# Patient Record
Sex: Male | Born: 1950 | Race: White | Hispanic: No | Marital: Married | Smoking: Former smoker
Health system: Southern US, Community
[De-identification: ages and names within clinical notes are randomized; demographics above are authoritative.]

## PROBLEM LIST (undated history)

## (undated) DIAGNOSIS — L409 Psoriasis, unspecified: Secondary | ICD-10-CM

## (undated) DIAGNOSIS — I509 Heart failure, unspecified: Secondary | ICD-10-CM

## (undated) DIAGNOSIS — K219 Gastro-esophageal reflux disease without esophagitis: Secondary | ICD-10-CM

## (undated) DIAGNOSIS — G4730 Sleep apnea, unspecified: Secondary | ICD-10-CM

## (undated) DIAGNOSIS — J449 Chronic obstructive pulmonary disease, unspecified: Secondary | ICD-10-CM

## (undated) DIAGNOSIS — G629 Polyneuropathy, unspecified: Secondary | ICD-10-CM

## (undated) DIAGNOSIS — I82409 Acute embolism and thrombosis of unspecified deep veins of unspecified lower extremity: Secondary | ICD-10-CM

## (undated) DIAGNOSIS — R51 Headache: Secondary | ICD-10-CM

## (undated) DIAGNOSIS — I739 Peripheral vascular disease, unspecified: Secondary | ICD-10-CM

## (undated) DIAGNOSIS — C801 Malignant (primary) neoplasm, unspecified: Secondary | ICD-10-CM

## (undated) DIAGNOSIS — D649 Anemia, unspecified: Secondary | ICD-10-CM

## (undated) DIAGNOSIS — M79606 Pain in leg, unspecified: Secondary | ICD-10-CM

## (undated) DIAGNOSIS — R0602 Shortness of breath: Secondary | ICD-10-CM

## (undated) HISTORY — PX: VEIN LIGATION AND STRIPPING: SHX2653

## (undated) HISTORY — DX: Heart failure, unspecified: I50.9

## (undated) HISTORY — DX: Chronic obstructive pulmonary disease, unspecified: J44.9

## (undated) HISTORY — PX: OTHER SURGICAL HISTORY: SHX169

## (undated) HISTORY — DX: Pain in leg, unspecified: M79.606

## (undated) HISTORY — DX: Acute embolism and thrombosis of unspecified deep veins of unspecified lower extremity: I82.409

## (undated) HISTORY — PX: CYST EXCISION: SHX5701

## (undated) HISTORY — DX: Shortness of breath: R06.02

## (undated) HISTORY — DX: Gastro-esophageal reflux disease without esophagitis: K21.9

## (undated) HISTORY — PX: CARDIAC CATHETERIZATION: SHX172

---

## 2010-12-03 ENCOUNTER — Encounter (INDEPENDENT_AMBULATORY_CARE_PROVIDER_SITE_OTHER): Payer: Medicare Other

## 2010-12-03 ENCOUNTER — Ambulatory Visit (INDEPENDENT_AMBULATORY_CARE_PROVIDER_SITE_OTHER): Payer: Medicare Other | Admitting: Vascular Surgery

## 2010-12-03 ENCOUNTER — Encounter: Payer: Self-pay | Admitting: Vascular Surgery

## 2010-12-03 VITALS — BP 110/62 | HR 74 | Resp 20 | Ht 71.0 in | Wt 340.0 lb

## 2010-12-03 DIAGNOSIS — I83229 Varicose veins of left lower extremity with both ulcer of unspecified site and inflammation: Secondary | ICD-10-CM

## 2010-12-03 DIAGNOSIS — I83219 Varicose veins of right lower extremity with both ulcer of unspecified site and inflammation: Secondary | ICD-10-CM

## 2010-12-03 DIAGNOSIS — I739 Peripheral vascular disease, unspecified: Secondary | ICD-10-CM

## 2010-12-03 DIAGNOSIS — L98499 Non-pressure chronic ulcer of skin of other sites with unspecified severity: Secondary | ICD-10-CM

## 2010-12-03 NOTE — Progress Notes (Signed)
Subjective:     Patient ID: Jeremy [name], male   DOB: 1950-06-26, 60 y.o.   MRN: [id number]  HPI this 60 year old obese male patient was referred from the pain Center for evaluation of an extensive ulcer in the left lateral ankle area. The patient states that he has had ulcers in his left leg for the past thirty-year's or so. He has had bilateral vein strippings in the 1970s. He thinks he has a history of deep vein thrombosis. He has had skin grafts in the left leg ulcer on at least 2 occasions. He currently has a topical skin product-applegraft in place. He was referred for evaluation for arterial insufficiency. He ambulates very short distances. He has morbid obesity and has had 2 skin stapling procedures in the 1980s. He is on home oxygen.   Today I ordered a lower arterial Dopplers which were normal with good wave forms and excellent ABIs bilaterally. I also ordered a venous duplex exam of the left leg which are reviewed and interpreted and performed a bedside sono site venous exam independently. He has no evidence of residual great saphenous vein in the groin or thigh area. His deep venous system has evidence of previous DVT and has severe reflux.   Review of Systems  Constitutional: Positive for unexpected weight change. Negative for fever, chills and appetite change.  HENT: Negative for hearing loss, nosebleeds, congestion, sore throat, trouble swallowing and voice change.   Eyes: Negative for photophobia and visual disturbance.  Respiratory: Negative for cough, chest tightness, shortness of breath and wheezing.   Cardiovascular: Positive for leg swelling. Negative for chest pain and palpitations.  Gastrointestinal: Positive for abdominal distention. Negative for nausea, vomiting, abdominal pain, diarrhea, constipation and blood in stool.       He has a history of morbid obesity with 2 previous gastric stapling procedures performed in the 1980s. He gained weight after that period of time and  this continued to be morbidly obese.  Genitourinary: Negative for urgency, frequency, hematuria and difficulty urinating.  Musculoskeletal: Negative for back pain, joint swelling, arthralgias and gait problem.  Skin: Positive for wound. Negative for pallor and rash.       There is an extensive venous stasis type ulcer almost circumferential in nature. It measures approximately 12 x 14 cm and involves the lateral aspect of the lower leg on the left. The skin surrounding this has hyperpigmentation and is scaly consistent with chronic venous insufficiency. He has similar skin changes on the right which are not as severe with no active ulcer. His toes are pink and well perfused.  Neurological: Negative for dizziness, syncope, facial asymmetry, speech difficulty, weakness, light-headedness, numbness and headaches.  Hematological: Negative for adenopathy.  Psychiatric/Behavioral: Negative for confusion.       Objective:   Physical Exam  Constitutional: He is oriented to person, place, and time. He appears well-developed and well-nourished. No distress.  HENT:  Head: Normocephalic.  Mouth/Throat: Oropharynx is clear and moist.  Eyes: EOM are normal.  Neck: Normal range of motion. Neck supple.  Cardiovascular: Normal rate and regular rhythm.   No murmur heard.      Pulses are not palpable but his toes are pink and warm and well perfused.  Pulmonary/Chest: Effort normal and breath sounds normal. No respiratory distress. He has no wheezes. He has no rales.  Abdominal: Soft. He exhibits no distension and no mass. There is no tenderness. There is no rebound and no guarding.  Musculoskeletal: Normal range of motion. He  exhibits no edema.  Lymphadenopathy:    He has no cervical adenopathy.  Neurological: He is alert and oriented to person, place, and time. Coordination normal.  Skin: Skin is warm and dry. No rash noted.       He has an extensive stasis ulcer on the lateral aspect of the left lower leg  measuring 12-14 cm in diameter. The skin surrounding this has hyperpigmentation and is very crusty and scaly in appearance. Right leg has similar hyperpigmentation but no active ulcers. There is chronic edema in both lower extremities at 1-2+.  Psychiatric: His behavior is normal.   Blood pressure 110/62, pulse 74, resp. rate 20, height 5\' 11"  (1.803 m), weight 340 lb (154.223 kg).   Assessment:    no evidence of arterial insufficiency by arterial Doppler exam. Waveforms are normal and ABIs are normal. Chronic venous insufficiency due to deep venous reflux with no evidence of superficial venous reflux. Previous vein stripping left greater saphenous vein. 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    Plan:     Nothing to offer from a standpoint of treatment for severe venous insufficiency. Elevation, elastic compression, local wound care, possible skin grafting. There is no need for further arterial evaluation. I have no further recommendations signed

## 2010-12-21 ENCOUNTER — Encounter: Payer: Self-pay | Admitting: Vascular Surgery

## 2011-03-12 ENCOUNTER — Encounter (HOSPITAL_COMMUNITY): Payer: Self-pay | Admitting: *Deleted

## 2011-03-12 ENCOUNTER — Encounter (HOSPITAL_COMMUNITY): Payer: Self-pay | Admitting: Pharmacy Technician

## 2011-03-12 ENCOUNTER — Inpatient Hospital Stay (HOSPITAL_COMMUNITY)
Admission: RE | Admit: 2011-03-12 | Discharge: 2011-03-26 | DRG: 264 | Disposition: A | Discharge status: Home / Self Care | Payer: Medicare Other | Source: Ambulatory Visit | Attending: Orthopedic Surgery | Admitting: Orthopedic Surgery

## 2011-03-12 ENCOUNTER — Ambulatory Visit (HOSPITAL_COMMUNITY): Payer: Medicare Other

## 2011-03-12 ENCOUNTER — Other Ambulatory Visit: Payer: Self-pay

## 2011-03-12 ENCOUNTER — Other Ambulatory Visit (HOSPITAL_COMMUNITY): Payer: Self-pay | Admitting: Orthopedic Surgery

## 2011-03-12 DIAGNOSIS — I83219 Varicose veins of right lower extremity with both ulcer of unspecified site and inflammation: Secondary | ICD-10-CM

## 2011-03-12 DIAGNOSIS — Z86718 Personal history of other venous thrombosis and embolism: Secondary | ICD-10-CM

## 2011-03-12 DIAGNOSIS — I83229 Varicose veins of left lower extremity with both ulcer of unspecified site and inflammation: Secondary | ICD-10-CM

## 2011-03-12 DIAGNOSIS — B965 Pseudomonas (aeruginosa) (mallei) (pseudomallei) as the cause of diseases classified elsewhere: Secondary | ICD-10-CM | POA: Diagnosis present

## 2011-03-12 DIAGNOSIS — E669 Obesity, unspecified: Secondary | ICD-10-CM | POA: Diagnosis present

## 2011-03-12 DIAGNOSIS — Z7901 Long term (current) use of anticoagulants: Secondary | ICD-10-CM

## 2011-03-12 DIAGNOSIS — E119 Type 2 diabetes mellitus without complications: Secondary | ICD-10-CM | POA: Diagnosis present

## 2011-03-12 DIAGNOSIS — E875 Hyperkalemia: Secondary | ICD-10-CM | POA: Diagnosis not present

## 2011-03-12 DIAGNOSIS — A4902 Methicillin resistant Staphylococcus aureus infection, unspecified site: Secondary | ICD-10-CM | POA: Diagnosis present

## 2011-03-12 DIAGNOSIS — N179 Acute kidney failure, unspecified: Secondary | ICD-10-CM | POA: Diagnosis not present

## 2011-03-12 DIAGNOSIS — I83009 Varicose veins of unspecified lower extremity with ulcer of unspecified site: Principal | ICD-10-CM | POA: Diagnosis present

## 2011-03-12 DIAGNOSIS — D649 Anemia, unspecified: Secondary | ICD-10-CM | POA: Diagnosis present

## 2011-03-12 HISTORY — DX: Malignant (primary) neoplasm, unspecified: C80.1

## 2011-03-12 HISTORY — DX: Anemia, unspecified: D64.9

## 2011-03-12 HISTORY — DX: Headache: R51

## 2011-03-12 HISTORY — DX: Sleep apnea, unspecified: G47.30

## 2011-03-12 HISTORY — DX: Peripheral vascular disease, unspecified: I73.9

## 2011-03-12 LAB — SURGICAL PCR SCREEN: MRSA, PCR: POSITIVE — AB

## 2011-03-12 LAB — GLUCOSE, CAPILLARY: Glucose-Capillary: 104 mg/dL — ABNORMAL HIGH (ref 70–99)

## 2011-03-12 LAB — COMPREHENSIVE METABOLIC PANEL
ALT: 10 U/L (ref 0–53)
CO2: 27 mEq/L (ref 19–32)
Calcium: 8.6 mg/dL (ref 8.4–10.5)
Chloride: 99 mEq/L (ref 96–112)
Creatinine, Ser: 0.88 mg/dL (ref 0.50–1.35)
GFR calc Af Amer: >90 mL/min (ref >90)
GFR calc non Af Amer: >90 mL/min (ref >90)
Glucose, Bld: 108 mg/dL — ABNORMAL HIGH (ref 70–99)
Total Bilirubin: 0.3 mg/dL (ref 0.3–1.2)

## 2011-03-12 LAB — PROTIME-INR: INR: 3.64 — ABNORMAL HIGH (ref 0.00–1.49)

## 2011-03-12 LAB — CBC
Hemoglobin: 10.1 g/dL — ABNORMAL LOW (ref 13.0–17.0)
MCH: 29.3 pg (ref 26.0–34.0)
MCV: 91.6 fL (ref 78.0–100.0)
RBC: 3.45 MIL/uL — ABNORMAL LOW (ref 4.22–5.81)

## 2011-03-12 MED ORDER — SIMVASTATIN 5 MG PO TABS
5.0000 mg | ORAL_TABLET | Freq: Every day | ORAL | Status: DC
  Administered 2011-03-13 – 2011-03-25 (×13): 5 mg via ORAL
  Filled 2011-03-12 (×14): qty 1

## 2011-03-12 MED ORDER — INSULIN ASPART 100 UNIT/ML ~~LOC~~ SOLN
0.0000 [IU] | Freq: Three times a day (TID) | SUBCUTANEOUS | Status: DC
  Administered 2011-03-13 – 2011-03-25 (×4): 3 [IU] via SUBCUTANEOUS
  Filled 2011-03-12: qty 3

## 2011-03-12 MED ORDER — METFORMIN HCL 500 MG PO TABS
500.0000 mg | ORAL_TABLET | Freq: Every day | ORAL | Status: DC
  Administered 2011-03-13 – 2011-03-17 (×5): 500 mg via ORAL
  Filled 2011-03-12 (×8): qty 1

## 2011-03-12 MED ORDER — HYDROCODONE-ACETAMINOPHEN 5-325 MG PO TABS
1.0000 | ORAL_TABLET | Freq: Once | ORAL | Status: AC
  Administered 2011-03-12: 2 via ORAL
  Filled 2011-03-12: qty 2

## 2011-03-12 MED ORDER — WARFARIN SODIUM 5 MG PO TABS: 5.0000 mg | ORAL_TABLET | Freq: Every day | ORAL | Status: DC

## 2011-03-12 MED ORDER — REPAGLINIDE 1 MG PO TABS
1.0000 mg | ORAL_TABLET | Freq: Every day | ORAL | Status: DC
  Administered 2011-03-13 – 2011-03-25 (×11): 1 mg via ORAL
  Filled 2011-03-12 (×14): qty 1

## 2011-03-12 MED ORDER — LEVALBUTEROL TARTRATE 45 MCG/ACT IN AERO
1.0000 | INHALATION_SPRAY | RESPIRATORY_TRACT | Status: DC | PRN
  Filled 2011-03-12: qty 15

## 2011-03-12 MED ORDER — CEFAZOLIN SODIUM-DEXTROSE 2-3 GM-% IV SOLR
2.0000 g | INTRAVENOUS | Status: DC
  Filled 2011-03-12: qty 50

## 2011-03-12 MED ORDER — MONTELUKAST SODIUM 10 MG PO TABS
10.0000 mg | ORAL_TABLET | Freq: Every day | ORAL | Status: DC
  Administered 2011-03-12 – 2011-03-25 (×15): 10 mg via ORAL
  Filled 2011-03-12 (×16): qty 1

## 2011-03-12 MED ORDER — WARFARIN SODIUM 7.5 MG PO TABS: 7.5000 mg | ORAL_TABLET | Freq: Every day | ORAL | Status: DC

## 2011-03-12 MED ORDER — CLONAZEPAM 0.5 MG PO TABS
0.5000 mg | ORAL_TABLET | Freq: Two times a day (BID) | ORAL | Status: DC | PRN
  Administered 2011-03-12 – 2011-03-20 (×3): 0.5 mg via ORAL
  Filled 2011-03-12 (×3): qty 1

## 2011-03-12 MED ORDER — SODIUM CHLORIDE 0.9 % IV SOLN
INTRAVENOUS | Status: DC
  Administered 2011-03-12: 20 mL via INTRAVENOUS

## 2011-03-12 MED ORDER — TRAMADOL HCL 50 MG PO TABS
50.0000 mg | ORAL_TABLET | Freq: Four times a day (QID) | ORAL | Status: DC | PRN
  Administered 2011-03-12 – 2011-03-19 (×4): 50 mg via ORAL
  Filled 2011-03-12 (×4): qty 1

## 2011-03-12 MED ORDER — ALBUTEROL SULFATE (5 MG/ML) 0.5% IN NEBU
2.5000 mg | INHALATION_SOLUTION | Freq: Four times a day (QID) | RESPIRATORY_TRACT | Status: DC
  Administered 2011-03-13: 2.5 mg via RESPIRATORY_TRACT
  Filled 2011-03-12: qty 0.5

## 2011-03-12 MED ORDER — CLINDAMYCIN PHOSPHATE 600 MG/50ML IV SOLN: 600.0000 mg | Freq: Three times a day (TID) | INTRAVENOUS | Status: DC

## 2011-03-12 MED ORDER — CHLORHEXIDINE GLUCONATE 4 % EX LIQD
60.0000 mL | Freq: Once | CUTANEOUS | Status: AC
  Administered 2011-03-12: 4 via TOPICAL

## 2011-03-12 MED ORDER — LISINOPRIL 10 MG PO TABS
10.0000 mg | ORAL_TABLET | Freq: Every day | ORAL | Status: DC
  Administered 2011-03-13 – 2011-03-20 (×6): 10 mg via ORAL
  Filled 2011-03-12 (×9): qty 1

## 2011-03-12 MED ORDER — FERROUS SULFATE 325 (65 FE) MG PO TABS
325.0000 mg | ORAL_TABLET | Freq: Three times a day (TID) | ORAL | Status: DC
  Administered 2011-03-12 – 2011-03-26 (×41): 325 mg via ORAL
  Filled 2011-03-12 (×43): qty 1

## 2011-03-12 MED ORDER — OXYCODONE HCL 15 MG PO TB12: 7.5000 mg | ORAL_TABLET | Freq: Four times a day (QID) | ORAL | Status: DC | PRN

## 2011-03-12 MED ORDER — WARFARIN SODIUM 7.5 MG PO TABS
7.5000 mg | ORAL_TABLET | ORAL | Status: DC
  Filled 2011-03-12: qty 1

## 2011-03-12 MED ORDER — POTASSIUM CHLORIDE CRYS ER 10 MEQ PO TBCR
10.0000 meq | EXTENDED_RELEASE_TABLET | Freq: Every day | ORAL | Status: DC
  Administered 2011-03-13 – 2011-03-21 (×9): 10 meq via ORAL
  Filled 2011-03-12 (×9): qty 1

## 2011-03-12 MED ORDER — CIPROFLOXACIN HCL 500 MG PO TABS
500.0000 mg | ORAL_TABLET | Freq: Two times a day (BID) | ORAL | Status: DC
  Administered 2011-03-12 – 2011-03-18 (×12): 500 mg via ORAL
  Filled 2011-03-12 (×14): qty 1

## 2011-03-12 MED ORDER — REPAGLINIDE-METFORMIN HCL 1-500 MG PO TABS
1.0000 | ORAL_TABLET | Freq: Every day | ORAL | Status: DC
Start: 2011-03-12 — End: 2011-03-12

## 2011-03-12 MED ORDER — FUROSEMIDE 40 MG PO TABS
40.0000 mg | ORAL_TABLET | Freq: Two times a day (BID) | ORAL | Status: DC
  Administered 2011-03-13 – 2011-03-20 (×16): 40 mg via ORAL
  Filled 2011-03-12 (×20): qty 1

## 2011-03-12 MED ORDER — TIOTROPIUM BROMIDE MONOHYDRATE 18 MCG IN CAPS
18.0000 ug | ORAL_CAPSULE | Freq: Every day | RESPIRATORY_TRACT | Status: DC
  Administered 2011-03-13 – 2011-03-26 (×14): 18 ug via RESPIRATORY_TRACT
  Filled 2011-03-12 (×3): qty 5

## 2011-03-12 MED ORDER — WARFARIN SODIUM 5 MG IV SOLR: 5.0000 mg | INTRAVENOUS | Status: DC

## 2011-03-12 MED ORDER — VANCOMYCIN HCL 1000 MG IV SOLR
1500.0000 mg | Freq: Two times a day (BID) | INTRAVENOUS | Status: DC
  Administered 2011-03-13 – 2011-03-19 (×12): 1500 mg via INTRAVENOUS
  Filled 2011-03-12 (×18): qty 1500

## 2011-03-12 MED ORDER — FENTANYL 75 MCG/HR TD PT72
75.0000 ug | MEDICATED_PATCH | TRANSDERMAL | Status: DC
  Administered 2011-03-12 – 2011-03-18 (×3): 75 ug via TRANSDERMAL
  Filled 2011-03-12 (×3): qty 1

## 2011-03-12 MED ORDER — SULFAMETHOXAZOLE-TMP DS 800-160 MG PO TABS
1.0000 | ORAL_TABLET | Freq: Two times a day (BID) | ORAL | Status: DC
  Administered 2011-03-12: 1 via ORAL

## 2011-03-12 MED ORDER — VANCOMYCIN HCL 1000 MG IV SOLR
2500.0000 mg | Freq: Once | INTRAVENOUS | Status: AC
  Administered 2011-03-12: 2500 mg via INTRAVENOUS
  Filled 2011-03-12: qty 2500

## 2011-03-12 MED ORDER — FLUTICASONE-SALMETEROL 500-50 MCG/DOSE IN AEPB
1.0000 | INHALATION_SPRAY | Freq: Two times a day (BID) | RESPIRATORY_TRACT | Status: DC
  Administered 2011-03-13 – 2011-03-26 (×27): 1 via RESPIRATORY_TRACT
  Filled 2011-03-12 (×2): qty 14

## 2011-03-12 MED ORDER — INSULIN ASPART 100 UNIT/ML ~~LOC~~ SOLN
6.0000 [IU] | Freq: Three times a day (TID) | SUBCUTANEOUS | Status: DC
  Administered 2011-03-13 – 2011-03-15 (×3): 6 [IU] via SUBCUTANEOUS
  Administered 2011-03-18: 3 [IU] via SUBCUTANEOUS

## 2011-03-12 MED ORDER — WARFARIN SODIUM 5 MG PO TABS: 5.0000 mg | ORAL_TABLET | ORAL | Status: DC

## 2011-03-12 MED ORDER — MUPIROCIN 2 % EX OINT
TOPICAL_OINTMENT | CUTANEOUS | Status: AC
  Administered 2011-03-12: 13:00:00
  Filled 2011-03-12: qty 22

## 2011-03-12 NOTE — Progress Notes (Signed)
Sleep study requested from Dr Delia Heady in [place], [place]

## 2011-03-12 NOTE — Progress Notes (Signed)
ANTIBIOTIC CONSULT NOTE - INITIAL  Pharmacy Consult for Vancomycin Indication: Left leg ulcer requiring debridement  No Known Allergies  Patient Measurements: Height: 5\' 11"  (180.3 cm) Weight: 345 lb (156.491 kg) IBW/kg (Calculated) : 75.3    Vital Signs: Temp: 98 F (36.7 C) (11/20 1245) Temp src: Oral (11/20 1245) BP: 82/45 mmHg (11/20 1245) Pulse Rate: 63  (11/20 1245)  Labs:  Basename 03/12/11 1242  WBC 7.2  HGB 10.1*  PLT 246  LABCREA --  CREATININE 0.88   Estimated Creatinine Clearance: 137.8 ml/min (by C-G formula based on Cr of 0.88).   Microbiology: Recent Results (from the past 720 hour(s))  SURGICAL PCR SCREEN     Status: Abnormal   Collection Time   03/12/11 12:33 PM      Component Value Range Status Comment   MRSA, PCR POSITIVE (*) NEGATIVE  Final    Staphylococcus aureus POSITIVE (*) NEGATIVE  Final     Medical History: Past Medical History  Diagnosis Date  . Diabetes mellitus   . COPD (chronic obstructive pulmonary disease)   . GERD (gastroesophageal reflux disease)   . Asthma   . Shortness of breath   . Leg pain   . CHF (congestive heart failure)   . Peripheral vascular disease   . Sleep apnea     uses bipap, sleep study done in [place] 2 years ago, dr Cala Bradford byrd  . Anemia   . Headache     sinus headaches  . Cancer     benign skin cancer    Medications:  Prescriptions prior to admission  Medication Sig Dispense Refill  . albuterol (PROVENTIL) (2.5 MG/3ML) 0.083% nebulizer solution Take 2.5 mg by nebulization every 6 (six) hours as needed. For shortness of breath      . ciprofloxacin (CIPRO) 500 MG tablet Take 500 mg by mouth 2 (two) times daily.        . clonazePAM (KLONOPIN) 0.5 MG tablet Take 0.5 mg by mouth 2 (two) times daily as needed. For anxiety      . fentaNYL (DURAGESIC - DOSED MCG/HR) 75 MCG/HR Place 1 patch onto the skin every 3 (three) days.        . ferrous sulfate 325 (65 FE) MG tablet Take 325 mg by mouth 3  (three) times daily.        . Fluticasone-Salmeterol (ADVAIR) 500-50 MCG/DOSE AEPB Inhale 1 puff into the lungs every 12 (twelve) hours.        . furosemide (LASIX) 40 MG tablet Take 40 mg by mouth 2 (two) times daily.        [name] levalbuterol (XOPENEX HFA) 45 MCG/ACT inhaler Inhale 1-2 puffs into the lungs every 4 (four) hours as needed. For shortness of breath      . lisinopril (PRINIVIL,ZESTRIL) 10 MG tablet Take 10 mg by mouth daily.        . montelukast (SINGULAIR) 10 MG tablet Take 10 mg by mouth at bedtime.        [name] oxyCODONE (OXYCONTIN) 15 MG TB12 Take 7.5 mg by mouth every 6 (six) hours as needed. For pain      . potassium chloride (KLOR-CON) 10 MEQ CR tablet Take 10 mEq by mouth daily.        . pravastatin (PRAVACHOL) 10 MG tablet Take 10 mg by mouth daily.        . repaglinide-metformin (PRANDIMET) 1-500 MG tablet Take 1 tablet by mouth daily before supper.        [name]  tiotropium (SPIRIVA) 18 MCG inhalation capsule Place 18 mcg into inhaler and inhale daily.        [name] warfarin (COUMADIN) 5 MG tablet Take 5-7.5 mg by mouth daily. Sat and Sun only pt takes 1 tab (5mg ); All other days pt takes 1.5 tabs (7.5mg )       . DISCONTD: sulfamethoxazole-trimethoprim (BACTRIM DS) 800-160 MG per tablet Take 1 tablet by mouth 2 (two) times daily.        [name] DISCONTD: traMADol (ULTRAM) 50 MG tablet Take 50 mg by mouth every 6 (six) hours as needed.       [name] DISCONTD: warfarin (COUMADIN) 7.5 MG tablet Take 7.5 mg by mouth daily. Pt takes 7.5 Mon-Fri and Sat & Sunday        Assessment: 60 year old man to start in IV Vancomycin for left leg ulcers.    Goal of Therapy:  Vancomycin trough level 10-15 mcg/ml  Plan:  Measure antibiotic drug levels at steady state.  Follow renal function with MD.  Vancomycin dosing will be 2.5g IV x 1 dose, then 1500 mg IV q12.  Mickeal Skinner 03/12/2011,9:04 PM

## 2011-03-12 NOTE — H&P (Signed)
Jeremy [name] is an 60 y.o. male.   Chief Complaint: Left leg ulcer  HPI: Patient he reports a chronic 40 year history of ulceration to the left leg. Patient states he is undergone prolonged conservative treatment including an Unna compressive wraps Dynaflex compressive wraps and Profore compressive wraps. Patient has undergone split thickness skin graft as well as 3 applications of Apligraf. Patient has undergone silver alginate treatment serial debridements all without relief.  Past Medical History  Diagnosis Date  . Diabetes mellitus   . COPD (chronic obstructive pulmonary disease)   . GERD (gastroesophageal reflux disease)   . Asthma   . Shortness of breath   . Leg pain   . CHF (congestive heart failure)   . Peripheral vascular disease   . Sleep apnea     uses bipap, sleep study done in [place] 2 years ago, dr Cala Bradford byrd  . Anemia   . Headache     sinus headaches  . Cancer     benign skin cancer    Past Surgical History  Procedure Date  . Arm surgery   . Vein ligation and stripping   . Tummy tuck   . Stomach stapling   . Skin grafts     Family History  Problem Relation Age of Onset  . Heart disease Brother    Social History:  reports that he quit smoking about 20 years ago. His smoking use included Cigarettes. He has a 3 pack-year smoking history. He does not have any smokeless tobacco history on file. He reports that he drinks alcohol. He reports that he does not use illicit drugs.  Allergies: No Known Allergies  Medications Prior to Admission  Medication Dose Route Frequency Provider Last Rate Last Dose  . ceFAZolin (ANCEF) IVPB 2 g/50 mL premix  2 g Intravenous 60 min Pre-Op Nadara Mustard, MD      . HYDROcodone-acetaminophen [facility]) 5-325 MG per tablet 1-2 tablet  1-2 tablet Oral Once Aubery Lapping, MD   2 tablet at 03/12/11 1713  . mupirocin ointment (BACTROBAN) 2 %            Medications Prior to Admission  Medication Sig Dispense Refill  . albuterol  (PROVENTIL) (2.5 MG/3ML) 0.083% nebulizer solution Take 2.5 mg by nebulization every 6 (six) hours as needed. For shortness of breath      . clonazePAM (KLONOPIN) 0.5 MG tablet Take 0.5 mg by mouth 2 (two) times daily as needed. For anxiety      . ferrous sulfate 325 (65 FE) MG tablet Take 325 mg by mouth 3 (three) times daily.        . Fluticasone-Salmeterol (ADVAIR) 500-50 MCG/DOSE AEPB Inhale 1 puff into the lungs every 12 (twelve) hours.        . furosemide (LASIX) 40 MG tablet Take 40 mg by mouth 2 (two) times daily.        [name] levalbuterol (XOPENEX HFA) 45 MCG/ACT inhaler Inhale 1-2 puffs into the lungs every 4 (four) hours as needed. For shortness of breath      . lisinopril (PRINIVIL,ZESTRIL) 10 MG tablet Take 10 mg by mouth daily.        . montelukast (SINGULAIR) 10 MG tablet Take 10 mg by mouth at bedtime.        [name] oxyCODONE (OXYCONTIN) 15 MG TB12 Take 7.5 mg by mouth every 6 (six) hours as needed. For pain      . potassium chloride (KLOR-CON) 10 MEQ CR tablet Take 10  mEq by mouth daily.        . pravastatin (PRAVACHOL) 10 MG tablet Take 10 mg by mouth daily.        . repaglinide-metformin (PRANDIMET) 1-500 MG tablet Take 1 tablet by mouth daily before supper.        . tiotropium (SPIRIVA) 18 MCG inhalation capsule Place 18 mcg into inhaler and inhale daily.          Results for orders placed during the hospital encounter of 03/12/11 (from the past 48 hour(s))  SURGICAL PCR SCREEN     Status: Abnormal   Collection Time   03/12/11 12:33 PM      Component Value Range Comment   MRSA, PCR POSITIVE (*) NEGATIVE     Staphylococcus aureus POSITIVE (*) NEGATIVE    APTT     Status: Abnormal   Collection Time   03/12/11 12:42 PM      Component Value Range Comment   aPTT 81 (*) 24 - 37 (seconds)   CBC     Status: Abnormal   Collection Time   03/12/11 12:42 PM      Component Value Range Comment   WBC 7.2  4.0 - 10.5 (K/uL)    RBC 3.45 (*) 4.22 - 5.81 (MIL/uL)    Hemoglobin 10.1 (*) 13.0  - 17.0 (g/dL)    HCT 65.7 (*) 84.6 - 52.0 (%)    MCV 91.6  78.0 - 100.0 (fL)    MCH 29.3  26.0 - 34.0 (pg)    MCHC 32.0  30.0 - 36.0 (g/dL)    RDW 96.2  95.2 - 84.1 (%)    Platelets 246  150 - 400 (K/uL)   COMPREHENSIVE METABOLIC PANEL     Status: Abnormal   Collection Time   03/12/11 12:42 PM      Component Value Range Comment   Sodium 138  135 - 145 (mEq/L)    Potassium 5.0  3.5 - 5.1 (mEq/L)    Chloride 99  96 - 112 (mEq/L)    CO2 27  19 - 32 (mEq/L)    Glucose, Bld 108 (*) 70 - 99 (mg/dL)    BUN 22  6 - 23 (mg/dL)    Creatinine, Ser 3.24  0.50 - 1.35 (mg/dL)    Calcium 8.6  8.4 - 10.5 (mg/dL)    Total Protein 6.4  6.0 - 8.3 (g/dL)    Albumin 3.0 (*) 3.5 - 5.2 (g/dL)    AST 16  0 - 37 (U/L) HEMOLYSIS AT THIS LEVEL MAY AFFECT RESULT   ALT 10  0 - 53 (U/L)    Alkaline Phosphatase 85  39 - 117 (U/L)    Total Bilirubin 0.3  0.3 - 1.2 (mg/dL)    GFR calc non Af Amer >90  >90 (mL/min)    GFR calc Af Amer >90  >90 (mL/min)   PROTIME-INR     Status: Abnormal   Collection Time   03/12/11 12:42 PM      Component Value Range Comment   Prothrombin Time 36.8 (*) 11.6 - 15.2 (seconds)    INR 3.64 (*) 0.00 - 1.49    GLUCOSE, CAPILLARY     Status: Abnormal   Collection Time   03/12/11 12:49 PM      Component Value Range Comment   Glucose-Capillary 107 (*) 70 - 99 (mg/dL)   GLUCOSE, CAPILLARY     Status: Normal   Collection Time   03/12/11  2:58 PM      Component  Value Range Comment   Glucose-Capillary 97  70 - 99 (mg/dL)   GLUCOSE, CAPILLARY     Status: Abnormal   Collection Time   03/12/11  5:08 PM      Component Value Range Comment   Glucose-Capillary 104 (*) 70 - 99 (mg/dL)    Comment 1 Documented in Chart      Comment 2 Notify RN      Dg Chest 2 View  03/12/2011  *RADIOLOGY REPORT*  Clinical Data: Preoperative respiratory exam; left mid calf ulcer; diabetes  CHEST - 2 VIEW  Comparison: None.  Findings: The cardiac silhouette, mediastinum, pulmonary vasculature are within  normal limits.  Both lungs are clear. There is discontinuity of the cortex of the posterior left fifth rib.  IMPRESSION: There is no evidence of acute cardiac or pulmonary process.  There is discontinuity of the posterior left fifth rib which may be related to old trauma; however, an aggressive osseous lesion cannot be excluded, and a full left rib series is recommended.  Original Report Authenticated By: Brandon Melnick, M.D.    ROS  Blood pressure 82/45, pulse 63, temperature 98 F (36.7 C), temperature source Oral, resp. rate 18, height 5\' 11"  (1.803 m), weight 156.491 kg (345 lb), SpO2 97.00%. Physical Exam on examination patient is alert oriented BMI greater than 50 with a ulcer to the left lower extremity which is approximately 3/4 of the circumference of his leg. He is brawny skin color changes with lymphedema and venous stasis insufficiency. There is copious amounts of clear drainage.  Assessment/Plan Assessment chronic lymphedema venous stasis ulcer left lower extremity. New sentence plan patient was referred to me for evaluation for a transtibial amputation. Discussed with the patient that we could proceed with a transtibial amputation of DVT the extensive nature of the wound I am not sure that he would even heal a transtibial amputation. Discussed that we could try one additional treatment to try limb salvage. This would require irrigation and debridement of the wound with application of a compression sock which will be changed daily. Patient states he understands this is a trial treatment. He understands that this is being tried in lieu of amputation of his leg. Discussed that this was not successful we would proceed with the original plan transtibial amputation. Patient states he understands and wishes to proceed at this time we'll admit him start him on IV antibiotics and planned for irrigation and debridement tomorrow  Fryda Molenda V 03/12/2011, 8:02 PM

## 2011-03-13 ENCOUNTER — Encounter (HOSPITAL_COMMUNITY): Payer: Self-pay | Admitting: General Practice

## 2011-03-13 ENCOUNTER — Inpatient Hospital Stay (HOSPITAL_COMMUNITY): Payer: Medicare Other | Admitting: Anesthesiology

## 2011-03-13 ENCOUNTER — Encounter (HOSPITAL_COMMUNITY): Payer: Self-pay | Admitting: Anesthesiology

## 2011-03-13 ENCOUNTER — Encounter (HOSPITAL_COMMUNITY)
Admission: RE | Disposition: A | Discharge status: Home / Self Care | Payer: Self-pay | Source: Ambulatory Visit | Attending: Orthopedic Surgery

## 2011-03-13 HISTORY — PX: I & D EXTREMITY: SHX5045

## 2011-03-13 LAB — GLUCOSE, CAPILLARY
Glucose-Capillary: 95 mg/dL (ref 70–99)
Glucose-Capillary: 126 mg/dL — ABNORMAL HIGH (ref 70–99)

## 2011-03-13 SURGERY — IRRIGATION AND DEBRIDEMENT EXTREMITY
Anesthesia: General | Site: Leg Lower | Laterality: Left | Wound class: Dirty or Infected

## 2011-03-13 MED ORDER — HYDROMORPHONE 0.3 MG/ML IV SOLN
INTRAVENOUS | Status: AC
  Filled 2011-03-13: qty 25

## 2011-03-13 MED ORDER — HYDROMORPHONE 0.3 MG/ML IV SOLN
INTRAVENOUS | Status: DC
  Administered 2011-03-13: 7.5 mg via INTRAVENOUS
  Administered 2011-03-13: 2.4 mg via INTRAVENOUS
  Administered 2011-03-13: 7.5 mg via INTRAVENOUS
  Administered 2011-03-14: 1.5 mg via INTRAVENOUS
  Administered 2011-03-14: 3.7 mg via INTRAVENOUS
  Administered 2011-03-14: 2.4 mg via INTRAVENOUS
  Administered 2011-03-14: 7.5 mg via INTRAVENOUS
  Administered 2011-03-14: 3.85 mg via INTRAVENOUS
  Administered 2011-03-15: 7.5 mg via INTRAVENOUS
  Administered 2011-03-15: 1.8 mg via INTRAVENOUS
  Administered 2011-03-15: 3.3 mg via INTRAVENOUS
  Administered 2011-03-15: 5 mg via INTRAVENOUS
  Administered 2011-03-15: 4.8 mg via INTRAVENOUS
  Administered 2011-03-15 – 2011-03-16 (×2): 1.8 mg via INTRAVENOUS
  Administered 2011-03-16 (×2): 7.5 mg via INTRAVENOUS
  Administered 2011-03-16: 4.5 mg via INTRAVENOUS
  Administered 2011-03-17: 0.6 mg via INTRAVENOUS
  Administered 2011-03-17: 1.2 mg via INTRAVENOUS
  Administered 2011-03-17: 2.6 mg via INTRAVENOUS
  Administered 2011-03-18: 1.2 mg via INTRAVENOUS
  Filled 2011-03-13 (×4): qty 25

## 2011-03-13 MED ORDER — ONDANSETRON HCL 4 MG/2ML IJ SOLN
INTRAMUSCULAR | Status: DC | PRN
  Administered 2011-03-13: 4 mg via INTRAVENOUS

## 2011-03-13 MED ORDER — WARFARIN SODIUM 2.5 MG PO TABS
2.5000 mg | ORAL_TABLET | Freq: Once | ORAL | Status: AC
  Administered 2011-03-13: 2.5 mg via ORAL
  Filled 2011-03-13: qty 1

## 2011-03-13 MED ORDER — LIDOCAINE HCL (CARDIAC) 20 MG/ML IV SOLN
INTRAVENOUS | Status: DC | PRN
  Administered 2011-03-13: 50 mg via INTRAVENOUS

## 2011-03-13 MED ORDER — VANCOMYCIN HCL IN DEXTROSE 1-5 GM/200ML-% IV SOLN: 1000.0000 mg | Freq: Two times a day (BID) | INTRAVENOUS | Status: DC

## 2011-03-13 MED ORDER — DIPHENHYDRAMINE HCL 50 MG/ML IJ SOLN: 12.5000 mg | Freq: Four times a day (QID) | INTRAMUSCULAR | Status: DC | PRN

## 2011-03-13 MED ORDER — ONDANSETRON HCL 4 MG/2ML IJ SOLN: 4.0000 mg | Freq: Four times a day (QID) | INTRAMUSCULAR | Status: DC | PRN

## 2011-03-13 MED ORDER — LACTATED RINGERS IV SOLN
INTRAVENOUS | Status: DC | PRN
  Administered 2011-03-13: 10:00:00 via INTRAVENOUS

## 2011-03-13 MED ORDER — NALOXONE HCL 0.4 MG/ML IJ SOLN: 0.4000 mg | INTRAMUSCULAR | Status: DC | PRN

## 2011-03-13 MED ORDER — LACTATED RINGERS IV SOLN: INTRAVENOUS | Status: DC

## 2011-03-13 MED ORDER — OXYCODONE HCL 5 MG PO TABS
7.5000 mg | ORAL_TABLET | Freq: Four times a day (QID) | ORAL | Status: DC | PRN
  Administered 2011-03-14 (×2): 7.5 mg via ORAL
  Administered 2011-03-15: 5 mg via ORAL
  Administered 2011-03-15 – 2011-03-26 (×20): 7.5 mg via ORAL
  Filled 2011-03-13 (×5): qty 2
  Filled 2011-03-13: qty 1
  Filled 2011-03-13 (×12): qty 2
  Filled 2011-03-13: qty 1
  Filled 2011-03-13 (×3): qty 2
  Filled 2011-03-13: qty 1
  Filled 2011-03-13 (×2): qty 2

## 2011-03-13 MED ORDER — DIPHENHYDRAMINE HCL 12.5 MG/5ML PO ELIX
12.5000 mg | ORAL_SOLUTION | Freq: Four times a day (QID) | ORAL | Status: DC | PRN
  Filled 2011-03-13: qty 5

## 2011-03-13 MED ORDER — PROPOFOL 10 MG/ML IV EMUL
INTRAVENOUS | Status: DC | PRN
  Administered 2011-03-13: 200 mg via INTRAVENOUS

## 2011-03-13 MED ORDER — HYDROMORPHONE HCL PF 1 MG/ML IJ SOLN
0.2500 mg | INTRAMUSCULAR | Status: DC | PRN
  Administered 2011-03-13 – 2011-03-16 (×4): 0.5 mg via INTRAVENOUS
  Filled 2011-03-13 (×3): qty 1

## 2011-03-13 MED ORDER — METOCLOPRAMIDE HCL 5 MG/ML IJ SOLN
5.0000 mg | Freq: Three times a day (TID) | INTRAMUSCULAR | Status: DC | PRN
  Filled 2011-03-13: qty 2

## 2011-03-13 MED ORDER — FENTANYL CITRATE 0.05 MG/ML IJ SOLN
INTRAMUSCULAR | Status: DC | PRN
  Administered 2011-03-13 (×2): 100 ug via INTRAVENOUS
  Administered 2011-03-13: 50 ug via INTRAVENOUS

## 2011-03-13 MED ORDER — MIDAZOLAM HCL 5 MG/5ML IJ SOLN
INTRAMUSCULAR | Status: DC | PRN
  Administered 2011-03-13: 2 mg via INTRAVENOUS

## 2011-03-13 MED ORDER — SODIUM CHLORIDE 0.9 % IR SOLN
Status: DC | PRN
  Administered 2011-03-13: 1000 mL

## 2011-03-13 MED ORDER — SENNOSIDES-DOCUSATE SODIUM 8.6-50 MG PO TABS
1.0000 | ORAL_TABLET | Freq: Every evening | ORAL | Status: DC | PRN
  Administered 2011-03-19: 1 via ORAL
  Filled 2011-03-13: qty 1

## 2011-03-13 MED ORDER — ONDANSETRON HCL 4 MG PO TABS: 4.0000 mg | ORAL_TABLET | Freq: Four times a day (QID) | ORAL | Status: DC | PRN

## 2011-03-13 MED ORDER — METOCLOPRAMIDE HCL 10 MG PO TABS: 5.0000 mg | ORAL_TABLET | Freq: Three times a day (TID) | ORAL | Status: DC | PRN

## 2011-03-13 MED ORDER — OXYCODONE HCL 5 MG PO TABS: 7.5000 mg | ORAL_TABLET | Freq: Four times a day (QID) | ORAL | Status: DC

## 2011-03-13 MED ORDER — SODIUM CHLORIDE 0.9 % IV SOLN
1000.0000 mL | INTRAVENOUS | Status: DC
  Administered 2011-03-13 – 2011-03-14 (×2): 1000 mL via INTRAVENOUS
  Administered 2011-03-19: 250 mL via INTRAVENOUS
  Administered 2011-03-20: 1000 mL via INTRAVENOUS
  Administered 2011-03-20: 250 mL via INTRAVENOUS

## 2011-03-13 MED ORDER — ALBUTEROL SULFATE (5 MG/ML) 0.5% IN NEBU: 2.5000 mg | INHALATION_SOLUTION | Freq: Four times a day (QID) | RESPIRATORY_TRACT | Status: DC | PRN

## 2011-03-13 MED ORDER — SODIUM CHLORIDE 0.9 % IJ SOLN: 9.0000 mL | INTRAMUSCULAR | Status: DC | PRN

## 2011-03-13 MED FILL — Cefazolin Sodium For Inj 1 GM: INTRAMUSCULAR | Qty: 2 | Status: AC

## 2011-03-13 SURGICAL SUPPLY — 42 items
BANDAGE COBAN STERILE 6 (GAUZE/BANDAGES/DRESSINGS) ×2 IMPLANT
BLADE SURG 10 STRL SS (BLADE) ×2 IMPLANT
BNDG COHESIVE 4X5 TAN STRL (GAUZE/BANDAGES/DRESSINGS) IMPLANT
BNDG COHESIVE 6X5 TAN STRL LF (GAUZE/BANDAGES/DRESSINGS) ×4 IMPLANT
BNDG GAUZE STRTCH 6 (GAUZE/BANDAGES/DRESSINGS) IMPLANT
CLOTH BEACON ORANGE TIMEOUT ST (SAFETY) ×2 IMPLANT
COTTON STERILE ROLL (GAUZE/BANDAGES/DRESSINGS) IMPLANT
COVER SURGICAL LIGHT HANDLE (MISCELLANEOUS) ×2 IMPLANT
CUFF TOURNIQUET SINGLE 34IN LL (TOURNIQUET CUFF) IMPLANT
DRAPE U-SHAPE 47X51 STRL (DRAPES) ×2 IMPLANT
DRSG ADAPTIC 3X8 NADH LF (GAUZE/BANDAGES/DRESSINGS) IMPLANT
DRSG PAD ABDOMINAL 8X10 ST (GAUZE/BANDAGES/DRESSINGS) ×4 IMPLANT
DURAPREP 26ML APPLICATOR (WOUND CARE) ×2 IMPLANT
ELECT CAUTERY BLADE 6.4 (BLADE) IMPLANT
ELECT REM PT RETURN 9FT ADLT (ELECTROSURGICAL) ×2
ELECTRODE REM PT RTRN 9FT ADLT (ELECTROSURGICAL) ×1 IMPLANT
GLOVE BIOGEL PI IND STRL 9 (GLOVE) ×1 IMPLANT
GLOVE BIOGEL PI INDICATOR 9 (GLOVE) ×1
GLOVE SURG ORTHO 9.0 STRL STRW (GLOVE) ×2 IMPLANT
GOWN PREVENTION PLUS XLARGE (GOWN DISPOSABLE) ×2 IMPLANT
GOWN SRG XL XLNG 56XLVL 4 (GOWN DISPOSABLE) ×1 IMPLANT
GOWN STRL NON-REIN XL XLG LVL4 (GOWN DISPOSABLE) ×1
HANDPIECE INTERPULSE COAX TIP (DISPOSABLE) ×1
KIT BASIN OR (CUSTOM PROCEDURE TRAY) ×2 IMPLANT
KIT ROOM TURNOVER OR (KITS) ×2 IMPLANT
MANIFOLD NEPTUNE II (INSTRUMENTS) ×2 IMPLANT
NS IRRIG 1000ML POUR BTL (IV SOLUTION) ×2 IMPLANT
PACK ORTHO EXTREMITY (CUSTOM PROCEDURE TRAY) ×2 IMPLANT
PAD ARMBOARD 7.5X6 YLW CONV (MISCELLANEOUS) ×2 IMPLANT
PADDING CAST COTTON 6X4 STRL (CAST SUPPLIES) ×2 IMPLANT
SET HNDPC FAN SPRY TIP SCT (DISPOSABLE) ×1 IMPLANT
SPONGE GAUZE 4X4 12PLY (GAUZE/BANDAGES/DRESSINGS) ×2 IMPLANT
SPONGE LAP 18X18 X RAY DECT (DISPOSABLE) ×2 IMPLANT
STOCKINETTE IMPERVIOUS 9X36 MD (GAUZE/BANDAGES/DRESSINGS) IMPLANT
SWAB CULTURE LIQ STUART DBL (MISCELLANEOUS) IMPLANT
TOWEL OR 17X24 6PK STRL BLUE (TOWEL DISPOSABLE) ×2 IMPLANT
TOWEL OR 17X26 10 PK STRL BLUE (TOWEL DISPOSABLE) ×2 IMPLANT
TUBE ANAEROBIC SPECIMEN COL (MISCELLANEOUS) IMPLANT
TUBE CONNECTING 12X1/4 (SUCTIONS) ×2 IMPLANT
UNDERPAD 30X30 INCONTINENT (UNDERPADS AND DIAPERS) ×2 IMPLANT
WATER STERILE IRR 1000ML POUR (IV SOLUTION) ×2 IMPLANT
YANKAUER SUCT BULB TIP NO VENT (SUCTIONS) ×2 IMPLANT

## 2011-03-13 NOTE — Anesthesia Preprocedure Evaluation (Addendum)
Anesthesia Evaluation  Patient identified by MRN, date of birth, ID band Patient awake    Reviewed: Allergy & Precautions, H&P , NPO status , Patient's Chart, lab work & pertinent test results, reviewed documented beta blocker date and time   Airway Mallampati: I  Neck ROM: Full    Dental  (+) Edentulous Upper and Edentulous Lower   Pulmonary shortness of breath and Long-Term Oxygen Therapy, asthma , sleep apnea, Continuous Positive Airway Pressure Ventilation and Oxygen sleep apnea , COPD COPD inhaler and oxygen dependent, former smoker         Cardiovascular +CHF     Neuro/Psych  Headaches,    GI/Hepatic GERD-  Controlled and Medicated,  Endo/Other  Diabetes mellitus-, Well Controlled, Insulin DependentMorbid obesity  Renal/GU      Musculoskeletal  (+) Arthritis -,   Abdominal (+) obese,   Peds  Hematology   Anesthesia Other Findings   Reproductive/Obstetrics                          Anesthesia Physical Anesthesia Plan  ASA: III  Anesthesia Plan: General   Post-op Pain Management:    Induction: Intravenous  Airway Management Planned: LMA  Additional Equipment:   Intra-op Plan:   Post-operative Plan:   Informed Consent: I have reviewed the patients History and Physical, chart, labs and discussed the procedure including the risks, benefits and alternatives for the proposed anesthesia with the patient or authorized representative who has indicated his/her understanding and acceptance.   Dental advisory given  Plan Discussed with: CRNA and Surgeon  Anesthesia Plan Comments:        Anesthesia Quick Evaluation

## 2011-03-13 NOTE — Preoperative (Signed)
Beta Blockers   Reason not to administer Beta Blockers:Not Applicable 

## 2011-03-13 NOTE — Anesthesia Postprocedure Evaluation (Signed)
  Anesthesia Post-op Note  Patient: Jeremy [name]  Procedure(s) Performed:  IRRIGATION AND DEBRIDEMENT EXTREMITY  Patient Location: PACU  Anesthesia Type: General  Level of Consciousness: awake  Airway and Oxygen Therapy: Patient Spontanous Breathing  Post-op Pain: mild  Post-op Assessment: Post-op Vital signs reviewed  Post-op Vital Signs: stable  Complications: No apparent anesthesia complications

## 2011-03-13 NOTE — Anesthesia Procedure Notes (Signed)
Procedure Name: LMA Insertion Date/Time: 03/13/2011 11:03 AM Performed by: Margaree Mackintosh Pre-anesthesia Checklist: Patient identified, Emergency Drugs available, Suction available, Patient being monitored and Timeout performed Patient Re-evaluated:Patient Re-evaluated prior to inductionOxygen Delivery Method: Circle System Utilized Preoxygenation: Pre-oxygenation with 100% oxygen Intubation Type: IV induction LMA: LMA with gastric port inserted LMA Size: 4.0 Grade View: Grade I Number of attempts: 1 Dental Injury: Teeth and Oropharynx as per pre-operative assessment

## 2011-03-13 NOTE — Transfer of Care (Signed)
Immediate Anesthesia Transfer of Care Note  Patient: Jeremy [name]  Procedure(s) Performed:  IRRIGATION AND DEBRIDEMENT EXTREMITY  Patient Location: PACU  Anesthesia Type: General  Level of Consciousness: awake and alert   Airway & Oxygen Therapy: Patient connected to nasal cannula oxygen  Post-op Assessment: Report given to PACU RN and Post -op Vital signs reviewed and stable  Post vital signs: Reviewed and stable  Complications: No apparent anesthesia complications

## 2011-03-13 NOTE — Progress Notes (Signed)
ANTICOAGULATION CONSULT NOTE - Initial Consult  Pharmacy Consult for coumadin Indication: DVT L LE  No Known Allergies  Patient Measurements: Height: 5\' 11"  (180.3 cm) Weight: 345 lb (156.491 kg) IBW/kg (Calculated) : 75.3  Adjusted Body Weight:99.7 kg Labs:  Basename 03/13/11 0500 03/12/11 1242  HGB -- 10.1*  HCT -- 31.6*  PLT -- 246  APTT -- 81*  LABPROT 36.4* 36.8*  INR 3.59* 3.64*  HEPARINUNFRC -- --  CREATININE -- 0.88  CKTOTAL -- --  CKMB -- --  TROPONINI -- --   Estimated Creatinine Clearance: 137.8 ml/min (by C-G formula based on Cr of 0.88).   Medications:  Coumadin 7.5mg  daily X 5mg  on Sat/Sun. Took 7.5mg  11/19.  Was on Septra a few days PTA which may have caused increased INR. Pt also continues on Cipro which may increase INR.  Assessment: INR a little high. No bleeding nted Goal of Therapy:  INR 2-3   Plan:  Coumadin 2.5mg  x1 dose today, daily INR  Jeremy [name] T 03/13/2011,8:56 AM

## 2011-03-13 NOTE — Op Note (Signed)
03/13/2011  11:30 AM  PATIENT:  Jeremy [name]    PRE-OPERATIVE DIAGNOSIS:  ulcer, left leg  POST-OPERATIVE DIAGNOSIS:  Same  PROCEDURE:  IRRIGATION AND DEBRIDEMENT EXTREMITY  SURGEON:  Yulieth Carrender V, MD  PHYSICIAN ASSISTANT: none  ANESTHESIA:   General  PREOPERATIVE INDICATIONS:  Jeremy [name] is a  60 y.o. male with a diagnosis of ulcer, left leg who failed conservative measures and elected for surgical management.    The risks benefits and alternatives were discussed with the patient preoperatively including but not limited to the risks of infection, bleeding, nerve injury, cardiopulmonary complications, the need for revision surgery, among others, and the patient was willing to proceed.  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 After adequate levels and anesthesia were obtained patient's left lower extremity was prepped using DuraPrep and draped into a sterile field. The wound was debrided with a 21 blade knife down to bleeding viable granulation tissue. The wound was then irrigated with pulsatile lavage. The wound covered three quarters of the circumference of the calf from the tibial tubercle to the ankle. After debridement hemostasis was obtained the compressive stocking was applied to layer followed by a ABDs and a Coban dressing. Patient was extubated and taken to the PACU in stable condition. Plan for her stocking changes daily. Patient will need IV antibiotics anticipate hospital stay of at least one week. Anticipate the patient will be a candidate for a cell tissue graft once we have a healthy viable granulating tissue base

## 2011-03-13 NOTE — Progress Notes (Signed)
Patient ID: Jeremy [name], male   DOB: 1950/11/03, 60 y.o.   MRN: [id number] Patient was admitted his PCR screen was positive for MRSA. He was started on IV vancomycin. Plan for irrigation and debridement of the venous stasis and lymphedema wound left lower extremity today continue IV vancomycin and start with compression socks.

## 2011-03-14 LAB — GLUCOSE, CAPILLARY
Glucose-Capillary: 113 mg/dL — ABNORMAL HIGH (ref 70–99)
Glucose-Capillary: 130 mg/dL — ABNORMAL HIGH (ref 70–99)
Glucose-Capillary: 113 mg/dL — ABNORMAL HIGH (ref 70–99)

## 2011-03-14 LAB — PROTIME-INR
INR: 3.07 — ABNORMAL HIGH (ref 0.00–1.49)
Prothrombin Time: 32.2 seconds — ABNORMAL HIGH (ref 11.6–15.2)

## 2011-03-14 MED ORDER — WARFARIN SODIUM 2.5 MG PO TABS
2.5000 mg | ORAL_TABLET | Freq: Once | ORAL | Status: AC
Start: 2011-03-14 — End: 2011-03-15
  Administered 2011-03-14 – 2011-03-15 (×2): 2.5 mg via ORAL
  Filled 2011-03-14: qty 1

## 2011-03-14 MED ORDER — HYDROMORPHONE 0.3 MG/ML IV SOLN
INTRAVENOUS | Status: AC
  Filled 2011-03-14: qty 25

## 2011-03-14 NOTE — Progress Notes (Signed)
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   Objective: Vital signs in last 24 hours: Temp:  [97.9 F (36.6 C)-99 F (37.2 C)] 99 F (37.2 C) (11/22 0635) Pulse Rate:  [71-90] 84  (11/22 0635) Resp:  [11-20] 16  (11/22 0858) BP: (101-143)/(58-73) 120/58 mmHg (11/22 0635) SpO2:  [94 %-100 %] 96 % (11/22 0858) FiO2 (%):  [2 %-32 %] 32 % (11/22 0001)  Intake/Output from previous day: 11/21 0701 - 11/22 0700 In: 1620 [P.O.:480; I.V.:1140] Out: 20 [Blood:20] Intake/Output this shift:     Basename 03/12/11 1242  HGB 10.1*    Basename 03/12/11 1242  WBC 7.2  RBC 3.45*  HCT 31.6*  PLT 246    Basename 03/12/11 1242  NA 138  K 5.0  CL 99  CO2 27  BUN 22  CREATININE 0.88  GLUCOSE 108*  CALCIUM 8.6    Basename 03/14/11 0500 03/13/11 0500  LABPT -- --  INR 3.07* 3.59*    Neurologically intact  Assessment/Plan: The compression socks were changed today the wound bed shows decreased drainage and slight improvement in the granulation tissue bed to new compression socks were applied. The patient was premedicated prior to socks changing with IV Dilantin we will plan for daily sock changes. Patient was positive for MRSA and we will continue IV vancomycin per pharmacy anticipate at least 5 day hospital stay possibly longer   DUDA,MARCUS V 03/14/2011, 9:36 AM

## 2011-03-14 NOTE — Progress Notes (Signed)
ANTICOAGULATION CONSULT NOTE - Follow Up Consult  Pharmacy Consult for Coumadin(Warfarin) Indication: DVT L LE  Goal: INR 2-3  Assessment:  60yoM being continued on coumadin for Hx DVT. On Coumadin PTA (7.5mg  daily except 5mg  on Sat/Sun). INR still slightly supra-therapeutic (3.07) but trending down; was on Septra PTA which likely increased INR in addition to Cipro which patient continues on currently while in hospital. No bleeding noted in MD notes.  Plan: Coumadin 2.5mg  PO x 1 today F/u daily INR   --- No Known Allergies  Patient Measurements: Height: 5\' 11"  (180.3 cm) Weight: 345 lb (156.491 kg) IBW/kg (Calculated) : 75.3    Vital Signs: Temp: 99 F (37.2 C) (11/22 0635) BP: 120/58 mmHg (11/22 0635) Pulse Rate: 84  (11/22 0635)  Labs:  Basename 03/14/11 0500 03/13/11 0500 03/12/11 1242  HGB -- -- 10.1*  HCT -- -- 31.6*  PLT -- -- 246  APTT -- -- 81*  LABPROT 32.2* 36.4* 36.8*  INR 3.07* 3.59* 3.64*  HEPARINUNFRC -- -- --  CREATININE -- -- 0.88  CKTOTAL -- -- --  CKMB -- -- --  TROPONINI -- -- --   Estimated Creatinine Clearance: 137.8 ml/min (by C-G formula based on Cr of 0.88).   Kensly Bowmer N 03/14/2011,8:01 AM

## 2011-03-15 LAB — BASIC METABOLIC PANEL
BUN: 14 mg/dL (ref 6–23)
Creatinine, Ser: 0.89 mg/dL (ref 0.50–1.35)
GFR calc Af Amer: >90 mL/min (ref >90)
Glucose, Bld: 123 mg/dL — ABNORMAL HIGH (ref 70–99)
Sodium: 140 mEq/L (ref 135–145)

## 2011-03-15 LAB — GLUCOSE, CAPILLARY
Glucose-Capillary: 119 mg/dL — ABNORMAL HIGH (ref 70–99)
Glucose-Capillary: 105 mg/dL — ABNORMAL HIGH (ref 70–99)

## 2011-03-15 LAB — CBC
HCT: 31.3 % — ABNORMAL LOW (ref 39.0–52.0)
Hemoglobin: 9.9 g/dL — ABNORMAL LOW (ref 13.0–17.0)
MCV: 92.9 fL (ref 78.0–100.0)
RBC: 3.37 MIL/uL — ABNORMAL LOW (ref 4.22–5.81)
RDW: 14.1 % (ref 11.5–15.5)
WBC: 7.2 10*3/uL (ref 4.0–10.5)

## 2011-03-15 MED ORDER — HYDROMORPHONE 0.3 MG/ML IV SOLN
INTRAVENOUS | Status: AC
  Administered 2011-03-15: 1.8 mg via INTRAVENOUS
  Filled 2011-03-15: qty 25

## 2011-03-15 MED ORDER — GABAPENTIN 300 MG PO CAPS
300.0000 mg | ORAL_CAPSULE | Freq: Three times a day (TID) | ORAL | Status: DC
  Administered 2011-03-15 – 2011-03-26 (×32): 300 mg via ORAL
  Filled 2011-03-15 (×37): qty 1

## 2011-03-15 MED ORDER — CHLORHEXIDINE GLUCONATE CLOTH 2 % EX PADS
6.0000 | MEDICATED_PAD | Freq: Every day | CUTANEOUS | Status: AC
  Administered 2011-03-15 – 2011-03-19 (×4): 6 via TOPICAL

## 2011-03-15 MED ORDER — WARFARIN SODIUM 7.5 MG PO TABS
7.5000 mg | ORAL_TABLET | Freq: Once | ORAL | Status: AC
  Administered 2011-03-15: 7.5 mg via ORAL
  Filled 2011-03-15: qty 1

## 2011-03-15 MED ORDER — HYDROMORPHONE 0.3 MG/ML IV SOLN
INTRAVENOUS | Status: AC
  Administered 2011-03-15: 7.5 mg
  Filled 2011-03-15: qty 25

## 2011-03-15 MED ORDER — MUPIROCIN 2 % EX OINT
1.0000 "application " | TOPICAL_OINTMENT | Freq: Two times a day (BID) | CUTANEOUS | Status: AC
  Administered 2011-03-15 – 2011-03-19 (×9): 1 via NASAL
  Filled 2011-03-15: qty 22

## 2011-03-15 MED ORDER — HYDROMORPHONE 0.3 MG/ML IV SOLN
INTRAVENOUS | Status: AC
  Filled 2011-03-15: qty 25

## 2011-03-15 NOTE — Progress Notes (Signed)
Pharmacy CONSULT NOTE   Pharmacy Consult for Coumadin(Warfarin)/vancomycin/cipro Indication: DVT L LE, infected LLE ulcer  Goal: INR 2-3  Assessment:  60yoM being continued on coumadin for Hx DVT. On Coumadin PTA (7.5mg  daily except 5mg  on Sat/Sun). INR 2.63 -  trending down after held x 1 day and lower doses the past 2 days.; was on Septra PTA which likely increased INR in addition to Cipro which patient continues on currently while in hospital. No bleeding noted in MD notes. Vancomycin and Cipro for leg ulcer. Afebrile, creatinine stable  Plan: Coumadin7.5 mg PO x 1 today F/u daily INR Continue vancomycin 1500 mg IV q12 and Cipro 500mg  po bid.  No levels unless > 7 days planned     VLabs:  Basename 03/15/11 0600 03/14/11 0500 03/13/11 0500 03/12/11 1242  HGB 9.9* -- -- 10.1*  HCT 31.3* -- -- 31.6*  PLT 239 -- -- 246  APTT -- -- -- 81*  LABPROT 28.5* 32.2* 36.4* --  INR 2.63* 3.07* 3.59* --  HEPARINUNFRC -- -- -- --  CREATININE 0.89 -- -- 0.88  CKTOTAL -- -- -- --  CKMB -- -- -- --  TROPONINI -- -- -- --   Estimated Creatinine Clearance: 136.3 ml/min (by C-G formula based on Cr of 0.89).   Len Childs T 03/15/2011,11:03 AM

## 2011-03-15 NOTE — Progress Notes (Signed)
Utilization review completed. Chesnie Capell, RN, BSN. 03/15/11 

## 2011-03-15 NOTE — Progress Notes (Signed)
Patient ID: Jeremy [name], male   DOB: 06-Oct-1950, 60 y.o.   MRN: [id number] Sock was changed left leg, wound shows significant improvement with  75% granulation tissue,will plan on daily sock change, IV vanc, and A-cell tissue graft in about 3 days.

## 2011-03-15 NOTE — Progress Notes (Signed)
CSW received referral for possible SNF. For assessment, please see pt's chart. CSW will continue to follow and assess discharge needs.   Dede Query, MSW, [facility]resia [facility]ors [phone]

## 2011-03-16 LAB — GLUCOSE, CAPILLARY: Glucose-Capillary: 130 mg/dL — ABNORMAL HIGH (ref 70–99)

## 2011-03-16 LAB — PROTIME-INR: Prothrombin Time: 31.4 seconds — ABNORMAL HIGH (ref 11.6–15.2)

## 2011-03-16 MED ORDER — WARFARIN SODIUM 5 MG PO TABS
5.0000 mg | ORAL_TABLET | Freq: Once | ORAL | Status: AC
  Administered 2011-03-16: 5 mg via ORAL
  Filled 2011-03-16: qty 1

## 2011-03-16 MED ORDER — HYDROMORPHONE 0.3 MG/ML IV SOLN
INTRAVENOUS | Status: AC
  Filled 2011-03-16: qty 25

## 2011-03-16 MED ORDER — HYDROMORPHONE 0.3 MG/ML IV SOLN
INTRAVENOUS | Status: AC
  Administered 2011-03-16: 7.5 mg via INTRAVENOUS
  Filled 2011-03-16: qty 25

## 2011-03-16 NOTE — Plan of Care (Signed)
Problem: Phase III Progression Outcomes Goal: Pain controlled on oral analgesia Outcome: Progressing Still on PCA Jeremy [name]  Goal: Voiding independently Outcome: Completed/Met Date Met:  03/16/11 Jeremy [name]    Goal: IV changed to normal saline lock Outcome: Progressing Jeremy [name]    Goal: Nasogastric tube discontinued Outcome: Not Applicable Date Met:  03/16/11 Jeremy [name]  Goal: Demonstrates TCDB, IS independently Outcome: Completed/Met Date Met:  03/16/11 Jeremy [name]     Problem: Discharge Progression Outcomes Goal: Pain controlled with appropriate interventions Outcome: Progressing Still on PCA Jeremy [name]  Goal: Hemodynamically stable Outcome: Completed/Met Date Met:  03/16/11 Jeremy [name]  Goal: Tolerating diet Outcome: Completed/Met Date Met:  03/16/11 Jeremy [name]  Goal: Tubes and drains discontinued if indicated Outcome: Not Applicable Date Met:  03/16/11 Jeremy [name]  Goal: Staples/sutures removed Outcome: Not Applicable Date Met:  03/16/11 Jeremy [name]  Goal: Steri-Strips applied Outcome: Not Applicable Date Met:  03/16/11 Jeremy [name]  Goal: Other Discharge Outcomes/Goals Outcome: Progressing Jeremy [name], [name]

## 2011-03-16 NOTE — Progress Notes (Signed)
ANTICOAGULATION CONSULT NOTE - Follow Up Consult  Pharmacy Consult for Coumadin Indication: DVT L LE  Assessment: 60yoM being continued on coumadin for Hx DVT. On Coumadin PTA (7.5mg  daily except 5mg  on Sat/Sun). INR 2.97; was on Septra PTA which likely increased INR in addition to Cipro which patient continues on currently while in hospital. No bleeding noted in MD notes.  Vancomycin and Cipro for leg ulcer. Afebrile, creatinine stable  Goal of Therapy:  INR 2-3   Plan: -Coumadin 5 mg po x 1 dose today - F/U am INR - Cont vanc and cipro. No vanc trough unless > 7 days planned  No Known Allergies  Patient Measurements: Height: 5\' 11"  (180.3 cm) Weight: 345 lb (156.491 kg) IBW/kg (Calculated) : 75.3   Vital Signs: Temp: 98.9 F (37.2 C) (11/24 0601) Temp src: Axillary (11/24 0601) BP: 127/53 mmHg (11/24 0601) Pulse Rate: 76  (11/24 0601)  Labs:  Basename 03/16/11 0630 03/15/11 0600 03/14/11 0500  HGB -- 9.9* --  HCT -- 31.3* --  PLT -- 239 --  APTT -- -- --  LABPROT 31.4* 28.5* 32.2*  INR 2.97* 2.63* 3.07*  HEPARINUNFRC -- -- --  CREATININE -- 0.89 --  CKTOTAL -- -- --  CKMB -- -- --  TROPONINI -- -- --   Estimated Creatinine Clearance: 136.3 ml/min (by C-G formula based on Cr of 0.89).   Medications:  Scheduled:    . Chlorhexidine Gluconate Cloth  6 each Topical Q0600  . ciprofloxacin  500 mg Oral BID  . fentaNYL  75 mcg Transdermal Q72H  . ferrous sulfate  325 mg Oral TID  . Fluticasone-Salmeterol  1 puff Inhalation Q12H  . furosemide  40 mg Oral BID  . gabapentin  300 mg Oral TID  . HYDROmorphone PCA 0.3 mg/mL   Intravenous Q4H  . HYDROmorphone PCA 0.3 mg/mL      . HYDROmorphone PCA 0.3 mg/mL      . insulin aspart  0-20 Units Subcutaneous TID WC  . insulin aspart  6 Units Subcutaneous TID WC  . lisinopril  10 mg Oral Daily  . metFORMIN  500 mg Oral Q supper  . montelukast  10 mg Oral QHS  . mupirocin ointment  1 application Nasal BID  . potassium  chloride  10 mEq Oral Daily  . repaglinide  1 mg Oral Q supper  . simvastatin  5 mg Oral q1800  . tiotropium  18 mcg Inhalation Daily  . vancomycin  1,500 mg Intravenous Q12H  . warfarin  2.5 mg Oral ONCE-1800  . warfarin  5 mg Oral ONCE-1800  . warfarin  7.5 mg Oral ONCE-1800   Infusions:    . sodium chloride 1,000 mL (03/15/11 2045)  . lactated ringers        Janace Litten, PharmD [phone] 03/16/2011,8:57 AM

## 2011-03-16 NOTE — Progress Notes (Signed)
Patient ID: Jeremy [name], male   DOB: 1950-08-10, 60 y.o.   MRN: [id number]  Subjective: No acute changes overnight.  Has been placed in silver impregnated sock on left leg wound.  Objective: AF/VSS Sock on left leg Calf soft Minimal drainage  Assess: Chronic left leg wound post I&D  Plan: Sock to be changed by Dr. Lajoyce Corners

## 2011-03-16 NOTE — Progress Notes (Signed)
Covering Clinical Social Worker is awaiting PT/OT evaluation to determine pt. Dc disposition. Theresia Bough, MSW, Theresia Majors [phone]

## 2011-03-16 NOTE — Plan of Care (Signed)
Problem: Phase II Progression Outcomes Goal: Dressings dry/intact Outcome: Progressing Jeremy [name]  Goal: Return of bowel function (flatus, BM) IF ABDOMINAL SURGERY:  Outcome: Progressing Jeremy [name]    Goal: Foley discontinued Outcome: Completed/Met Date Met:  03/16/11 Jeremy [name], [employer]

## 2011-03-16 NOTE — Progress Notes (Signed)
Patient ID: Jeremy [name], male   DOB: November 03, 1950, 60 y.o.   MRN: [id number] Patient underwent a wound evaluation for the venous stasis ulcer to the left lower extremity yesterday and today. His sock was changed yesterday and showed improved granulation tissue. Examination of that today the sock was removed there is now some wrinkling of the skin the wound bed has approximately 90% granulation tissue. The sock was changed with much less discomfort today in the left dressing change yesterday. We will plan to change the sock again tomorrow and planned on application of a-CELL xenograft tissue to the wound ulcers on Monday with discharge to home in the office on Monday. Patient is continuing his IV vancomycin for his MRSA infection.

## 2011-03-17 LAB — GLUCOSE, CAPILLARY: Glucose-Capillary: 123 mg/dL — ABNORMAL HIGH (ref 70–99)

## 2011-03-17 MED ORDER — HYDROMORPHONE 0.3 MG/ML IV SOLN
INTRAVENOUS | Status: AC
  Filled 2011-03-17: qty 25

## 2011-03-17 MED ORDER — WARFARIN SODIUM 5 MG PO TABS
5.0000 mg | ORAL_TABLET | Freq: Once | ORAL | Status: AC
  Administered 2011-03-17: 5 mg via ORAL
  Filled 2011-03-17: qty 1

## 2011-03-17 NOTE — Progress Notes (Signed)
Patient ID: Jeremy [name], male   DOB: 05-14-1950, 60 y.o.   MRN: [id number] Sock changed today, plan for A-cell xenograft tomorrow, possible d/c Monday or tuesday

## 2011-03-17 NOTE — Progress Notes (Signed)
Patient ID: Jeremy [name], male   DOB: [birth year], 60 y.o.   MRN: [id number] Feeling better overall.  Dr. Lajoyce Corners changed sock yesterday and plans to do that again today.

## 2011-03-17 NOTE — Progress Notes (Signed)
ANTICOAGULATION CONSULT NOTE - Follow Up Consult  Pharmacy Consult for Coumadin Indication: DVT L LE  Assessment: 60yoM being continued on coumadin for Hx DVT. On Coumadin PTA (7.5mg  daily except 5mg  on Sat/Sun). INR 2.87;  No bleeding noted  Vancomycin and Cipro for leg ulcer. Afebrile, creatinine stable  Goal of Therapy:  INR 2-3   Plan: -Coumadin 5 mg po x 1 dose today - F/U am INR - Cont vanc and cipro. No vanc trough unless > 7 days planned  No Known Allergies  Patient Measurements: Height: 5\' 11"  (180.3 cm) Weight: 345 lb (156.491 kg) IBW/kg (Calculated) : 75.3   Vital Signs: Temp: 98.5 F (36.9 C) (11/25 0548) Temp src: Axillary (11/25 0548) BP: 125/43 mmHg (11/25 0548) Pulse Rate: 71  (11/25 0548)  Labs:  Basename 03/17/11 0630 03/16/11 0630 03/15/11 0600  HGB -- -- 9.9*  HCT -- -- 31.3*  PLT -- -- 239  APTT -- -- --  LABPROT 30.5* 31.4* 28.5*  INR 2.87* 2.97* 2.63*  HEPARINUNFRC -- -- --  CREATININE -- -- 0.89  CKTOTAL -- -- --  CKMB -- -- --  TROPONINI -- -- --   Estimated Creatinine Clearance: 136.3 ml/min (by C-G formula based on Cr of 0.89).   Medications:  Scheduled:     . Chlorhexidine Gluconate Cloth  6 each Topical Q0600  . ciprofloxacin  500 mg Oral BID  . fentaNYL  75 mcg Transdermal Q72H  . ferrous sulfate  325 mg Oral TID  . Fluticasone-Salmeterol  1 puff Inhalation Q12H  . furosemide  40 mg Oral BID  . gabapentin  300 mg Oral TID  . HYDROmorphone PCA 0.3 mg/mL   Intravenous Q4H  . HYDROmorphone PCA 0.3 mg/mL      . insulin aspart  0-20 Units Subcutaneous TID WC  . insulin aspart  6 Units Subcutaneous TID WC  . lisinopril  10 mg Oral Daily  . metFORMIN  500 mg Oral Q supper  . montelukast  10 mg Oral QHS  . mupirocin ointment  1 application Nasal BID  . potassium chloride  10 mEq Oral Daily  . repaglinide  1 mg Oral Q supper  . simvastatin  5 mg Oral q1800  . tiotropium  18 mcg Inhalation Daily  . vancomycin  1,500 mg  Intravenous Q12H  . warfarin  5 mg Oral ONCE-1800  . warfarin  5 mg Oral ONCE-1800   Infusions:     . sodium chloride 1,000 mL (03/17/11 0256)  . lactated ringers        Janace Litten, PharmD [phone] 03/17/2011,9:11 AM

## 2011-03-18 ENCOUNTER — Encounter (HOSPITAL_COMMUNITY): Payer: Self-pay | Admitting: Orthopedic Surgery

## 2011-03-18 LAB — PROTIME-INR: Prothrombin Time: 33.2 seconds — ABNORMAL HIGH (ref 11.6–15.2)

## 2011-03-18 LAB — GLUCOSE, CAPILLARY
Glucose-Capillary: 166 mg/dL — ABNORMAL HIGH (ref 70–99)
Glucose-Capillary: 63 mg/dL — ABNORMAL LOW (ref 70–99)
Glucose-Capillary: 87 mg/dL (ref 70–99)

## 2011-03-18 MED ORDER — BISACODYL 10 MG RE SUPP: 10.0000 mg | Freq: Every day | RECTAL | Status: DC | PRN

## 2011-03-18 MED ORDER — DOCUSATE SODIUM 100 MG PO CAPS
100.0000 mg | ORAL_CAPSULE | Freq: Two times a day (BID) | ORAL | Status: DC | PRN
  Administered 2011-03-18: 100 mg via ORAL
  Filled 2011-03-18: qty 1

## 2011-03-18 MED ORDER — SENNOSIDES-DOCUSATE SODIUM 8.6-50 MG PO TABS
1.0000 | ORAL_TABLET | Freq: Every evening | ORAL | Status: DC | PRN
  Administered 2011-03-18 – 2011-03-21 (×2): 1 via ORAL
  Filled 2011-03-18 (×2): qty 1

## 2011-03-18 MED ORDER — SODIUM CHLORIDE 0.9 % IV SOLN
500.0000 mg | Freq: Four times a day (QID) | INTRAVENOUS | Status: DC
  Administered 2011-03-18 – 2011-03-20 (×8): 500 mg via INTRAVENOUS
  Filled 2011-03-18 (×13): qty 500

## 2011-03-18 MED ORDER — INSULIN ASPART 100 UNIT/ML ~~LOC~~ SOLN
3.0000 [IU] | Freq: Three times a day (TID) | SUBCUTANEOUS | Status: DC
  Administered 2011-03-19 – 2011-03-25 (×13): 3 [IU] via SUBCUTANEOUS

## 2011-03-18 MED ORDER — WARFARIN SODIUM 2.5 MG PO TABS
2.5000 mg | ORAL_TABLET | Freq: Once | ORAL | Status: AC
  Administered 2011-03-18: 2.5 mg via ORAL
  Filled 2011-03-18: qty 1

## 2011-03-18 MED ORDER — HYDROMORPHONE HCL PF 1 MG/ML IJ SOLN
0.5000 mg | INTRAMUSCULAR | Status: DC | PRN
  Administered 2011-03-18 – 2011-03-24 (×9): 1 mg via INTRAVENOUS
  Administered 2011-03-24: 0.5 mg via INTRAVENOUS
  Administered 2011-03-25 – 2011-03-26 (×2): 1 mg via INTRAVENOUS
  Filled 2011-03-18 (×12): qty 1

## 2011-03-18 NOTE — Progress Notes (Signed)
CSW is awaiting PT/OT eval to further discharge plans. Pt's family was not in pt's room at time of visit and pt was resting. CSW will continue to follow re: discharge plans.   Dede Query, MSW, [facility]ors [phone]

## 2011-03-18 NOTE — Progress Notes (Signed)
Pt. Placed on cpap. Auto titration mode with 3 liters of oxygen. Pt. Is wearing his own personal full face mask. Pt. Currently tolerating well. RT will continue to monitor.

## 2011-03-18 NOTE — Progress Notes (Signed)
Patient ID: Jeremy [name], male   DOB: 10-14-1950, 61 y.o.   MRN: [id number] Patient is seen in followup, his hospital cultures were positive for MRSA, labs obtained in the office are now positive for pseudomonas. Primaxin was ordered for IV antibiotic we will discontinue his Cipro the Pseudomonas was resistant to the Cipro. Continue IV vancomycin. The sock was changed the ulcer  bed is continuing to improve daily, marked improvement from his last examination  continuing daily silver sock dressing changes. Plan for placement of a cell xenograft on Wednesday evening with discharge Thursday morning.

## 2011-03-18 NOTE — Progress Notes (Signed)
Patient ID: Jeremy [name], male   DOB: [birth year], 60 y.o.   MRN: [id number] cxs positive for MRSA, and office cxs now positive for pseudamonas, will start imipenam

## 2011-03-18 NOTE — Progress Notes (Addendum)
ANTIBIOTIC CONSULT NOTE - INITIAL  Pharmacy Consult for primaxin/vancomycin/coumadin Indication: leg ulcer  No Known Allergies  Patient Measurements: Height: 5\' 11"  (180.3 cm) Weight: 345 lb (156.491 kg) IBW/kg (Calculated) : 75.3    Vital Signs: Temp: 97.8 F (36.6 C) (11/26 0625) Temp src: Oral (11/26 0625) BP: 100/53 mmHg (11/26 0625) Pulse Rate: 69  (11/26 0625)    Microbiology: Recent Results (from the past 720 hour(s))  SURGICAL PCR SCREEN     Status: Abnormal   Collection Time   03/12/11 12:33 PM      Component Value Range Status Comment   MRSA, PCR POSITIVE (*) NEGATIVE  Final    Staphylococcus aureus POSITIVE (*) NEGATIVE  Final      Assessment: 14 YOM who was on Vancolycin D#6 and cipro (continued from home med) for leg ulcer, outpatient cx positive for pseudomonas to add primaxin. Pt. Afebrile, no new BMET today, but his renal fx has been stable.   Goal of Therapy:  Vancomycin level 10-15  Plan:  1) primaxin 500mg  IV q6hr 2) recommend to d/c cipro 3) f/u renal fx and length of treatment for vanc and primaxin, will check vancomycin level tomorrow if continue for > 3 more days  Riki Rusk 03/18/2011,9:21 AM  Addendum: Pharmacy Consult for coumadin Indication: DVT L LE  Prothrombin Time/INR: 3.19  Assessment:  Patient is on coumadin for Hx DVT. Slightly supratherapeutic today 3.19 from 2.87; No bleeding noted   Goal of Therapy:  INR 2-3  Plan:  -Coumadin 2.5 mg po x 1 dose today  - F/U am INR

## 2011-03-18 NOTE — Progress Notes (Signed)
Inpatient Diabetes Program Recommendations  AACE/ADA: New Consensus Statement on Inpatient Glycemic Control (2009)  Target Ranges:  Prepandial:   less than 140 mg/dL      Peak postprandial:   less than 180 mg/dL (1-2 hours)      Critically ill patients:  140 - 180 mg/dL   CBGs 16/10: 960/ 454/ 63 mg/dl  Inpatient Diabetes Program Recommendations Correction (SSI): Please decrease SSI to Moderate scale tid ac + HS.  Noted Novolog meal coverage decreased to 3 units tid with meals- Agree!

## 2011-03-19 DIAGNOSIS — I83229 Varicose veins of left lower extremity with both ulcer of unspecified site and inflammation: Secondary | ICD-10-CM

## 2011-03-19 DIAGNOSIS — I83219 Varicose veins of right lower extremity with both ulcer of unspecified site and inflammation: Secondary | ICD-10-CM

## 2011-03-19 LAB — GLUCOSE, CAPILLARY
Glucose-Capillary: 113 mg/dL — ABNORMAL HIGH (ref 70–99)
Glucose-Capillary: 97 mg/dL (ref 70–99)

## 2011-03-19 LAB — PROTIME-INR: Prothrombin Time: 31.9 seconds — ABNORMAL HIGH (ref 11.6–15.2)

## 2011-03-19 MED ORDER — WARFARIN SODIUM 1 MG PO TABS
1.0000 mg | ORAL_TABLET | Freq: Once | ORAL | Status: AC
  Administered 2011-03-19: 1 mg via ORAL
  Filled 2011-03-19: qty 1

## 2011-03-19 MED ORDER — FENTANYL 50 MCG/HR TD PT72
50.0000 ug | MEDICATED_PATCH | TRANSDERMAL | Status: DC
  Administered 2011-03-21 – 2011-03-24 (×2): 50 ug via TRANSDERMAL
  Filled 2011-03-19 (×2): qty 1

## 2011-03-19 NOTE — Progress Notes (Signed)
ANTICOAGULATION CONSULT NOTE - Follow Up Consult  Pharmacy Consult for Coumadin Indication: hx of DVT  Vital Signs: Temp: 97.7 F (36.5 C) (11/27 0610) BP: 100/52 mmHg (11/27 1128) Pulse Rate: 72  (11/27 0610)  Labs:  Jeremy [name] 03/19/11 7829 03/18/11 0840 03/17/11 0630  HGB -- -- --  HCT -- -- --  PLT -- -- --  APTT -- -- --  LABPROT 31.9* 33.2* 30.5*  INR 3.03* 3.19* 2.87*  HEPARINUNFRC -- -- --  CREATININE -- -- --  CKTOTAL -- -- --  CKMB -- -- --  TROPONINI -- -- --    Assessment: 60 YOM on coumadin for Hx DVT (PTA dose 7.5mg  daily except 5mg  on Sat/Sun). INR is still slightly supratherapeutic today, but trending down. Dose decreased yesterday; No bleeding noted per chart  Goal of Therapy:  INR 2-3   Plan:  1) Coumadin 1mg  po x1 today 2) f/u INR trend tomorrow morning  Jeremy [name] 03/19/2011,11:45 AM

## 2011-03-19 NOTE — Progress Notes (Signed)
Patient ID: Jeremy [name], male   DOB: 19-May-1950, 60 y.o.   MRN: [id number] Patient is seen in followup for chronic venous stasis insufficiency ulcer to the left lower extremity he has had the ulcer for over 40 years. The ulcer is essentially circumferential from the tibial tubercle to the malleolar line. Patient is currently on IV vancomycin and imipenem for his MRSA infection and pseudomonal infection. Examination in the compression sock is removed patient has excellent beefy granulation tissue covering the entire wound surface. There is still a significant amount of fibrinous exudate which is debrided on the sock. There is no debris in the sock left behind on the wound. Due to the significant amount of fibrinous exudate which is still present. We will need to send the patient home with daily sock dressing changes. We will set him up with home health nursing so they can supervise his wound care. Plan for discharge Thursday morning continue IV vancomycin and imipenem. Photographs and measurements were made of the ulcer. Patient is starting to develop tremors from his chronic narcotic use. We will decrease his dose of the fentanyl patch.

## 2011-03-19 NOTE — Progress Notes (Signed)
Pt refused CPAP at this time, told RT to come back later. Rt will check on PT again later. RT will continue to monitor.

## 2011-03-19 NOTE — Consult Note (Signed)
Pharmacy note Re: Vancomycin levels  Vancomycin trough level reported as  83.9 mcg/ml.  Noted that Vancomycin trough sample drawn just below IV infusion site.  Based on patient's weight and Crcl, accumulation of Vancomycin of this degree unlikely.   Plan: Hold Vancomycin for now. Repeat Vancomycin Random level with AM labs.  Lesette Frary, Elisha Headland, Pharm.D. 03/19/2011 9:11 PM

## 2011-03-19 NOTE — Progress Notes (Signed)
CSW met with pt. Pt plans to discharge home with home health services, if needed. RNCM is aware. CSW is signing off, as no further social work needs identified. Please reconsult CSW if a need arises prior to discharge.   Dede Query, MSW, [facility]ia [facility]ors [phone]

## 2011-03-20 LAB — PROTIME-INR
INR: 2.73 — ABNORMAL HIGH (ref 0.00–1.49)
Prothrombin Time: 29.4 seconds — ABNORMAL HIGH (ref 11.6–15.2)

## 2011-03-20 LAB — BASIC METABOLIC PANEL
BUN: 63 mg/dL — ABNORMAL HIGH (ref 6–23)
CO2: 28 mEq/L (ref 19–32)
Calcium: 8.4 mg/dL (ref 8.4–10.5)
Chloride: 101 mEq/L (ref 96–112)
Creatinine, Ser: 3.57 mg/dL — ABNORMAL HIGH (ref 0.50–1.35)
GFR calc Af Amer: 20 mL/min — ABNORMAL LOW (ref >90)
GFR calc non Af Amer: 17 mL/min — ABNORMAL LOW (ref >90)
Glucose, Bld: 89 mg/dL (ref 70–99)
Potassium: 4.8 mEq/L (ref 3.5–5.1)
Sodium: 142 mEq/L (ref 135–145)

## 2011-03-20 LAB — GLUCOSE, CAPILLARY
Glucose-Capillary: 110 mg/dL — ABNORMAL HIGH (ref 70–99)
Glucose-Capillary: 111 mg/dL — ABNORMAL HIGH (ref 70–99)
Glucose-Capillary: 68 mg/dL — ABNORMAL LOW (ref 70–99)

## 2011-03-20 MED ORDER — SODIUM CHLORIDE 0.9 % IV SOLN
250.0000 mg | Freq: Four times a day (QID) | INTRAVENOUS | Status: DC
  Administered 2011-03-20 – 2011-03-24 (×15): 250 mg via INTRAVENOUS
  Filled 2011-03-20 (×19): qty 250

## 2011-03-20 MED ORDER — WARFARIN SODIUM 2.5 MG PO TABS
2.5000 mg | ORAL_TABLET | Freq: Once | ORAL | Status: AC
  Administered 2011-03-20: 2.5 mg via ORAL
  Filled 2011-03-20: qty 1

## 2011-03-20 NOTE — Progress Notes (Signed)
Pharmacy: Vancomycin + Primaxin  Pt started on vancomycin + primaxin for a leg ucler. Vancomycin trough last night was 83.9 so standing dose was discontinued. A random vancomycin level this AM reported as 57.8. Since Scr has not been checked since 11/23, I ordered a BMET which reported Jeremy [name] Scr at 3.57. Previous Scr on 11/23 was 0.89.   Current estimated half-life based on the two levels is ~18 hours. No utility in checking another AM vancomycin level because drug will likely still be at an elevated level.   Plan: - Will order another BMET for the AM to determine Scr trend.  - Pharmacy will order another vancomycin level based on Scr when appropriate - Continue to hold vancomycin - Change primaxin to 250mg  IV Q6H   Jeremy [name], Jeremy [name] 03/20/2011 10:50 AM

## 2011-03-20 NOTE — Progress Notes (Signed)
Utilization review completed. Laiden Milles, RN, BSN. 03/20/11  

## 2011-03-20 NOTE — Progress Notes (Signed)
ANTICOAGULATION CONSULT NOTE - Follow Up Consult  Pharmacy Consult for coumadin Indication: history of DVT  No Known Allergies  Patient Measurements: Height: 5\' 11"  (180.3 cm) Weight: 345 lb (156.491 kg) IBW/kg (Calculated) : 75.3   Vital Signs: Temp: 97.6 F (36.4 C) (11/28 0513) Temp src: Axillary (11/28 0513) BP: 110/64 mmHg (11/28 0513) Pulse Rate: 65  (11/28 0513)  Labs:  Basename 03/20/11 0535 03/19/11 0613 03/18/11 0840  HGB -- -- --  HCT -- -- --  PLT -- -- --  APTT -- -- --  LABPROT 29.4* 31.9* 33.2*  INR 2.73* 3.03* 3.19*  HEPARINUNFRC -- -- --  CREATININE 3.57* -- --  CKTOTAL -- -- --  CKMB -- -- --  TROPONINI -- -- --   Estimated Creatinine Clearance: 34 ml/min (by C-G formula based on Cr of 3.57).   Medications:  Scheduled:    . Chlorhexidine Gluconate Cloth  6 each Topical Q0600  . fentaNYL  50 mcg Transdermal Q72H  . ferrous sulfate  325 mg Oral TID  . Fluticasone-Salmeterol  1 puff Inhalation Q12H  . furosemide  40 mg Oral BID  . gabapentin  300 mg Oral TID  . imipenem-cilastatin  250 mg Intravenous Q6H  . insulin aspart  0-20 Units Subcutaneous TID WC  . insulin aspart  3 Units Subcutaneous TID WC  . lisinopril  10 mg Oral Daily  . metFORMIN  500 mg Oral Q supper  . montelukast  10 mg Oral QHS  . mupirocin ointment  1 application Nasal BID  . potassium chloride  10 mEq Oral Daily  . repaglinide  1 mg Oral Q supper  . simvastatin  5 mg Oral q1800  . tiotropium  18 mcg Inhalation Daily  . warfarin  1 mg Oral ONCE-1800  . warfarin  2.5 mg Oral ONCE-1800  . DISCONTD: fentaNYL  75 mcg Transdermal Q72H  . DISCONTD: imipenem-cilastatin  500 mg Intravenous Q6H  . DISCONTD: vancomycin  1,500 mg Intravenous Q12H    Assessment: 60 yom on coumadin for history of DVT. INR has been supratherapeutic but decreased today to 2.73. No bleeding noted. No CBC available.   Goal of Therapy:  INR 2-3   Plan:  Coumadin 2.5mg  PO x 1 tonight F/u AM  INR  Jordanna Hendrie, Drake Leach 03/20/2011,11:23 AM

## 2011-03-20 NOTE — Progress Notes (Signed)
PHARMACIST - PHYSICIAN COMMUNICATION CONCERNING:  METFORMIN SAFE ADMINISTRATION POLICY  RECOMMENDATION: Metformin has been placed on DISCONTINUE (rejected order) STATUS and should be reordered only after any of the conditions below are ruled out.  Current safety recommendations include avoiding metformin for a minimum of 48 hours after the patient's exposure to intravenous contrast media.  DESCRIPTION:  The Pharmacy Committee has adopted a policy that restricts the use of metformin in hospitalized patients until all the contraindications to administration have been ruled out. Specific contraindications are: [x]  Serum creatinine ? 1.5 for males []  Serum creatinine ? 1.4 for females []  Shock, acute MI, sepsis, hypoxemia, dehydration []  Planned administration of intravenous iodinated contrast media []  Heart Failure patients with low EF []  Acute or chronic metabolic acidosis (including DKA)  Please also address other medications impacted by worsening renal function including lisinopril.    Thank you. Teri Legacy, Drake Leach 03/20/2011 12:34 PM

## 2011-03-20 NOTE — Progress Notes (Signed)
Vancomycin trough reported as 83.9/ Pharmacy notified. Vancomycin dose for 2000 held.

## 2011-03-21 ENCOUNTER — Inpatient Hospital Stay (HOSPITAL_COMMUNITY): Payer: Medicare Other

## 2011-03-21 LAB — BASIC METABOLIC PANEL
CO2: 33 mEq/L — ABNORMAL HIGH (ref 19–32)
Chloride: 99 mEq/L (ref 96–112)
Creatinine, Ser: 3.31 mg/dL — ABNORMAL HIGH (ref 0.50–1.35)
GFR calc Af Amer: 22 mL/min — ABNORMAL LOW (ref >90)
Potassium: 5.4 mEq/L — ABNORMAL HIGH (ref 3.5–5.1)
Sodium: 141 mEq/L (ref 135–145)

## 2011-03-21 LAB — GLUCOSE, CAPILLARY
Glucose-Capillary: 142 mg/dL — ABNORMAL HIGH (ref 70–99)
Glucose-Capillary: 112 mg/dL — ABNORMAL HIGH (ref 70–99)
Glucose-Capillary: 111 mg/dL — ABNORMAL HIGH (ref 70–99)
Glucose-Capillary: 96 mg/dL (ref 70–99)
Glucose-Capillary: 70 mg/dL (ref 70–99)

## 2011-03-21 LAB — URINALYSIS, ROUTINE W REFLEX MICROSCOPIC
Glucose, UA: NEGATIVE mg/dL
Hgb urine dipstick: NEGATIVE
Protein, ur: NEGATIVE mg/dL
pH: 7 (ref 5.0–8.0)

## 2011-03-21 LAB — PROTIME-INR
INR: 2.18 — ABNORMAL HIGH (ref 0.00–1.49)
Prothrombin Time: 24.6 seconds — ABNORMAL HIGH (ref 11.6–15.2)

## 2011-03-21 LAB — VANCOMYCIN, RANDOM: Vancomycin Rm: 46.2 ug/mL

## 2011-03-21 MED ORDER — FENTANYL 50 MCG/HR TD PT72: 1.0000 | MEDICATED_PATCH | TRANSDERMAL | Status: AC

## 2011-03-21 MED ORDER — SODIUM POLYSTYRENE SULFONATE 15 GM/60ML PO SUSP
30.0000 g | Freq: Once | ORAL | Status: AC
  Administered 2011-03-21: 30 g via ORAL
  Filled 2011-03-21: qty 120

## 2011-03-21 MED ORDER — DOXYCYCLINE HYCLATE 50 MG PO CAPS: 100.0000 mg | ORAL_CAPSULE | Freq: Two times a day (BID) | ORAL | Status: AC

## 2011-03-21 MED ORDER — SODIUM CHLORIDE 0.9 % IV SOLN
1000.0000 mL | INTRAVENOUS | Status: AC
  Administered 2011-03-22 – 2011-03-24 (×3): 1000 mL via INTRAVENOUS

## 2011-03-21 MED ORDER — WARFARIN SODIUM 5 MG PO TABS
5.0000 mg | ORAL_TABLET | Freq: Once | ORAL | Status: AC
  Administered 2011-03-21: 5 mg via ORAL
  Filled 2011-03-21: qty 1

## 2011-03-21 MED ORDER — OXYCODONE HCL 7.5 MG PO TABS: 7.5000 mg | ORAL_TABLET | Freq: Four times a day (QID) | ORAL | Status: AC | PRN

## 2011-03-21 NOTE — Progress Notes (Signed)
Pharmacy: Vancomycin  Pt started on vancomycin + primaxin for a leg ucler- Vanc trough peaked at 83.9, now decreased to 46.2 mcg/ml, which is still supratherapeutic for goal 15-20 mcg/ml.  Based on changing Scr, it is difficult to est a true half life, but it is probably ~ 24h.   Noted MD checking Vanc trough in AM.  Will f/up along with you for further plans. NO further Vanc for now.  Thanks, Yordin Rhoda K. Allena Katz, PharmD, BCPS.  Clinical Pharmacist Pager [phone]. 03/21/2011 7:18 PM

## 2011-03-21 NOTE — Discharge Summary (Signed)
Physician Discharge Summary  Patient ID: Jeremy [name] MRN: [id number] DOB/AGE: Nov 15, 1950 60 y.o.  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 rate 18, height 5\' 11"  (1.803 m), weight 156.491 kg (345 lb), SpO2 96.00%. Incision/Wound:improved  Disposition: Final discharge disposition not confirmed   Current Discharge Medication List    START taking these medications   Details  doxycycline (VIBRAMYCIN) 50 MG capsule Take 2 capsules (100 mg total) by mouth 2 (two) times daily. Qty: 60 capsule, Refills: 0    oxyCODONE 7.5 MG TABS Take 7.5 mg by mouth every 6 (six) hours as needed. Qty: 30 tablet, Refills: 0      CONTINUE these medications which have CHANGED   Details  fentaNYL (DURAGESIC - DOSED MCG/HR) 50 MCG/HR Place 1 patch (50 mcg total) onto the skin every 3 (three) days. Qty: 10 patch, Refills: 0      CONTINUE these medications which have NOT CHANGED   Details  albuterol (PROVENTIL) (2.5 MG/3ML) 0.083% nebulizer solution Take 2.5 mg by nebulization every 6 (six) hours as needed. For shortness of breath    ciprofloxacin (CIPRO) 500 MG tablet Take 500 mg by mouth 2 (two) times daily.      clonazePAM (KLONOPIN) 0.5 MG tablet Take 0.5 mg by mouth 2 (two) times daily as needed. For anxiety    ferrous sulfate 325 (65 FE) MG tablet Take 325 mg by mouth 3 (three) times daily.      Fluticasone-Salmeterol (ADVAIR) 500-50 MCG/DOSE AEPB Inhale 1 puff into  the lungs every 12 (twelve) hours.      furosemide (LASIX) 40 MG tablet Take 40 mg by mouth 2 (two) times daily.      levalbuterol (XOPENEX HFA) 45 MCG/ACT inhaler Inhale 1-2 puffs into the lungs every 4 (four) hours as needed. For shortness of breath    lisinopril (PRINIVIL,ZESTRIL) 10 MG tablet Take 10 mg by mouth daily.      montelukast (SINGULAIR) 10 MG tablet Take 10 mg by mouth at bedtime.      oxyCODONE (OXYCONTIN) 15 MG TB12 Take 7.5 mg by mouth every 6 (six) hours as needed. For pain    potassium chloride (KLOR-CON) 10 MEQ CR tablet Take 10 mEq by mouth daily.      pravastatin (PRAVACHOL) 10 MG tablet Take 10 mg by mouth daily.      repaglinide-metformin (PRANDIMET) 1-500 MG tablet Take 1 tablet by mouth daily before supper.      tiotropium (SPIRIVA) 18 MCG inhalation capsule Place 18 mcg into inhaler and inhale daily.      warfarin (COUMADIN) 5 MG tablet Take 5-7.5 mg by mouth daily. Sat and Sun only pt takes 1 tab (5mg ); All other days pt takes 1.5 tabs (7.5mg )      STOP taking these medications     fentaNYL (DURAGESIC - DOSED MCG/HR) 75 MCG/HR      sulfamethoxazole-trimethoprim (BACTRIM DS) 800-160 MG per tablet      traMADol (ULTRAM) 50 MG tablet        Follow-up Information    Follow up with Britlyn Martine V,  MD in 1 week.   Contact information:   [address] [place] [address] [phone]          Signed: Nadara Mustard 03/21/2011, 6:34 AM

## 2011-03-21 NOTE — Progress Notes (Signed)
ANTICOAGULATION CONSULT NOTE - Follow Up Consult  Pharmacy Consult for coumadin Indication: history of DVT  No Known Allergies  Patient Measurements: Height: 5\' 11"  (180.3 cm) Weight: 345 lb (156.491 kg) IBW/kg (Calculated) : 75.3   Vital Signs: Temp: 97.8 F (36.6 C) (11/29 0611) BP: 119/57 mmHg (11/29 0611) Pulse Rate: 72  (11/29 0611)  Labs:  Basename 03/21/11 0540 03/20/11 0535 03/19/11 0613  HGB -- -- --  HCT -- -- --  PLT -- -- --  APTT -- -- --  LABPROT 24.6* 29.4* 31.9*  INR 2.18* 2.73* 3.03*  HEPARINUNFRC -- -- --  CREATININE 3.31* 3.57* --  CKTOTAL -- -- --  CKMB -- -- --  TROPONINI -- -- --   Estimated Creatinine Clearance: 36.6 ml/min (by C-G formula based on Cr of 3.31).   Medications:  Scheduled:     . fentaNYL  50 mcg Transdermal Q72H  . ferrous sulfate  325 mg Oral TID  . Fluticasone-Salmeterol  1 puff Inhalation Q12H  . furosemide  40 mg Oral BID  . gabapentin  300 mg Oral TID  . imipenem-cilastatin  250 mg Intravenous Q6H  . insulin aspart  0-20 Units Subcutaneous TID WC  . insulin aspart  3 Units Subcutaneous TID WC  . lisinopril  10 mg Oral Daily  . montelukast  10 mg Oral QHS  . repaglinide  1 mg Oral Q supper  . simvastatin  5 mg Oral q1800  . tiotropium  18 mcg Inhalation Daily  . warfarin  2.5 mg Oral ONCE-1800  . DISCONTD: potassium chloride  10 mEq Oral Daily    Assessment: 60 yom on coumadin for history of DVT. No bleeding noted. No CBC available.   Goal of Therapy:  INR 2-3   Plan:  Coumadin 5mg  PO x 1 tonight F/u AM INR   Janeah Kovacich L. Illene Bolus, PharmD, BCPS Clinical Pharmacist Pager: [phone] 03/21/2011 2:09 PM

## 2011-03-21 NOTE — Consult Note (Signed)
Jeremy [name] is an 60 y.o. male referred by Dr Lajoyce Corners   Chief Complaint: Acute renal failure HPI: 60 yo WM admitted 03/12/11 to undergo Iand D of chronic leg ulcer on left leg.  No previous renal history.  No hx gross hematuria, no stones, no obstructive sxs, no use of NSAID's.  Denies hx of htn. Has had DM for 2 yrs.  Has been on lisinopril for 2 yrs.  SCR .8 on admission and increased to 3.5 yest and today it is 3.3.  He has had SBP decrease into the 90's at times this hospitalization.  Past Medical History  Diagnosis Date  . Diabetes mellitus   . COPD (chronic obstructive pulmonary disease)   . GERD (gastroesophageal reflux disease)   . Asthma   . Shortness of breath   . Leg pain   . CHF (congestive heart failure)   . Peripheral vascular disease   . Sleep apnea     uses bipap, sleep study done in [place] 2 years ago, dr Cala Bradford byrd  . Anemia   . Headache     sinus headaches  . Cancer     benign skin cancer    Past Surgical History  Procedure Date  . Arm surgery   . Vein ligation and stripping   . Tummy tuck   . Stomach stapling   . Skin grafts   . I&d extremity 03/13/2011    Procedure: IRRIGATION AND DEBRIDEMENT EXTREMITY;  Surgeon: Nadara Mustard, MD;  Location: [facility] OR;  Service: Orthopedics;  Laterality: Left;    Family History  Problem Relation Age of Onset  . Heart disease Brother    Social History:  reports that he quit smoking about 20 years ago. His smoking use included Cigarettes. He has a 3 pack-year smoking history. He does not have any smokeless tobacco history on file. He reports that he drinks alcohol. He reports that he does not use illicit drugs.  Allergies: No Known Allergies  Medications Prior to Admission  Medication Dose Route Frequency Provider Last Rate Last Dose  . 0.9 %  sodium chloride infusion  1,000 mL Intravenous Continuous Nadara Mustard, MD 20 mL/hr at 03/21/11 0654 1,000 mL at 03/21/11 0654  . albuterol (PROVENTIL) (5 MG/ML) 0.5%  nebulizer solution 2.5 mg  2.5 mg Nebulization Q6H PRN Nadara Mustard, MD      . bisacodyl (DULCOLAX) suppository 10 mg  10 mg Rectal Daily PRN Nadara Mustard, MD      . chlorhexidine (HIBICLENS) 4 % liquid 4 application  60 mL Topical Once Nadara Mustard, MD   4 application at 03/12/11 2200  . Chlorhexidine Gluconate Cloth 2 % PADS 6 each  6 each Topical Q0600 Nadara Mustard, MD   6 each at 03/19/11 [phone]  . clonazePAM (KLONOPIN) tablet 0.5 mg  0.5 mg Oral BID PRN Nadara Mustard, MD   0.5 mg at 03/20/11 1348  . diphenhydrAMINE (BENADRYL) injection 12.5 mg  12.5 mg Intravenous Q6H PRN Nadara Mustard, MD       Or  . diphenhydrAMINE (BENADRYL) 12.5 MG/5ML elixir 12.5 mg  12.5 mg Oral Q6H PRN Nadara Mustard, MD      . docusate sodium (COLACE) capsule 100 mg  100 mg Oral BID PRN Nadara Mustard, MD   100 mg at 03/18/11 2219  . fentaNYL (DURAGESIC - dosed mcg/hr) 50 mcg  50 mcg Transdermal Q72H Nadara Mustard, MD      . ferrous  sulfate tablet 325 mg  325 mg Oral TID Nadara Mustard, MD   325 mg at 03/21/11 1121  . Fluticasone-Salmeterol (ADVAIR) 500-50 MCG/DOSE inhaler 1 puff  1 puff Inhalation Q12H Nadara Mustard, MD   1 puff at 03/21/11 0805  . furosemide (LASIX) tablet 40 mg  40 mg Oral BID Nadara Mustard, MD   40 mg at 03/20/11 2147  . gabapentin (NEURONTIN) capsule 300 mg  300 mg Oral TID Nadara Mustard, MD   300 mg at 03/21/11 1120  . HYDROcodone-acetaminophen (NORCO) 5-325 MG per tablet 1-2 tablet  1-2 tablet Oral Once Aubery Lapping, MD   2 tablet at 03/12/11 1713  . HYDROmorphone (DILAUDID) injection 0.5-1 mg  0.5-1 mg Intravenous Q2H PRN Nadara Mustard, MD   1 mg at 03/21/11 0645  . HYDROmorphone PCA 0.3 mg/mL (DILAUDID) 0.3 mg/mL infusion           . HYDROmorphone PCA 0.3 mg/mL (DILAUDID) 0.3 mg/mL infusion        1.8 mg at 03/15/11 1711  . HYDROmorphone PCA 0.3 mg/mL (DILAUDID) 0.3 mg/mL infusion        7.5 mg at 03/15/11 1233  . HYDROmorphone PCA 0.3 mg/mL (DILAUDID) 0.3 mg/mL infusion           .  imipenem-cilastatin (PRIMAXIN) 250 mg in sodium chloride 0.9 % 100 mL IVPB  250 mg Intravenous Q6H Drake Leach Rumbarger, PHARMD   250 mg at 03/21/11 1122  . insulin aspart (novoLOG) injection 0-20 Units  0-20 Units Subcutaneous TID WC Nadara Mustard, MD   3 Units at 03/13/11 1659  . insulin aspart (novoLOG) injection 3 Units  3 Units Subcutaneous TID WC Nadara Mustard, MD   3 Units at 03/21/11 0804  . levalbuterol [facility] HFA) inhaler 1-2 puff  1-2 puff Inhalation Q4H PRN Nadara Mustard, MD      . lisinopril (PRINIVIL,ZESTRIL) tablet 10 mg  10 mg Oral Daily Nadara Mustard, MD   10 mg at 03/20/11 1153  . metoCLOPramide (REGLAN) tablet 5-10 mg  5-10 mg Oral Q8H PRN Nadara Mustard, MD       Or  . metoCLOPramide (REGLAN) injection 5-10 mg  5-10 mg Intravenous Q8H PRN Nadara Mustard, MD      . montelukast (SINGULAIR) tablet 10 mg  10 mg Oral QHS Nadara Mustard, MD   10 mg at 03/20/11 2147  . mupirocin ointment (BACTROBAN) 2 % 1 application  1 application Nasal BID Nadara Mustard, MD   1 application at 03/19/11 2229  . mupirocin ointment (BACTROBAN) 2 %           . ondansetron (ZOFRAN) tablet 4 mg  4 mg Oral Q6H PRN Nadara Mustard, MD       Or  . ondansetron [facility]) injection 4 mg  4 mg Intravenous Q6H PRN Nadara Mustard, MD      . oxyCODONE (Oxy IR/ROXICODONE) immediate release tablet 7.5 mg  7.5 mg Oral Q6H PRN Nadara Mustard, MD   7.5 mg at 03/21/11 1239  . repaglinide (PRANDIN) tablet 1 mg  1 mg Oral Q supper Hilario Quarry Amend, PHARMD   1 mg at 03/20/11 1724  . senna-docusate (Senokot-S) tablet 1 tablet  1 tablet Oral QHS PRN Nadara Mustard, MD   1 tablet at 03/19/11 2241  . senna-docusate (Senokot-S) tablet 1 tablet  1 tablet Oral QHS PRN Nadara Mustard, MD   1 tablet at  03/18/11 2219  . simvastatin (ZOCOR) tablet 5 mg  5 mg Oral q1800 Nadara Mustard, MD   5 mg at 03/20/11 1724  . sodium chloride 0.9 % injection 9 mL  9 mL Intravenous PRN Nadara Mustard, MD      . tiotropium [facility]) inhalation capsule 18  mcg  18 mcg Inhalation Daily Nadara Mustard, MD   18 mcg at 03/21/11 [phone]  . traMADol (ULTRAM) tablet 50 mg  50 mg Oral Q6H PRN Nadara Mustard, MD   50 mg at 03/19/11 2240  . vancomycin (VANCOCIN) 2,500 mg in sodium chloride 0.9 % 500 mL IVPB  2,500 mg Intravenous Once Garth Bigness Frens, PHARMD   2,500 mg at 03/12/11 2300  . warfarin (COUMADIN) tablet 1 mg  1 mg Oral ONCE-1800 Riki Rusk, PHARMD   1 mg at 03/19/11 1742  . warfarin (COUMADIN) tablet 2.5 mg  2.5 mg Oral ONCE-1800 Nadara Mustard, MD   2.5 mg at 03/13/11 1820  . warfarin (COUMADIN) tablet 2.5 mg  2.5 mg Oral ONCE-1800 Memory Argue Sucic, PHARMD   2.5 mg at 03/15/11 2100  . warfarin (COUMADIN) tablet 2.5 mg  2.5 mg Oral ONCE-1800 Riki Rusk, PHARMD   2.5 mg at 03/18/11 1800  . warfarin (COUMADIN) tablet 2.5 mg  2.5 mg Oral ONCE-1800 Drake Leach Rumbarger, PHARMD   2.5 mg at 03/20/11 1724  . warfarin (COUMADIN) tablet 5 mg  5 mg Oral ONCE-1800 Burlene Arnt Elder, PHARMD   5 mg at 03/16/11 1700  . warfarin (COUMADIN) tablet 5 mg  5 mg Oral ONCE-1800 Burlene Arnt Elder, PHARMD   5 mg at 03/17/11 1754  . warfarin (COUMADIN) tablet 5 mg  5 mg Oral ONCE-1800 Gildardo Cranker [place], [place]      . warfarin (COUMADIN) tablet 7.5 mg  7.5 mg Oral ONCE-1800 Nadara Mustard, MD   7.5 mg at 03/15/11 1802  . DISCONTD: 0.9 %  sodium chloride infusion   Intravenous Continuous Nadara Mustard, MD 20 mL/hr at 03/12/11 2113 20 mL at 03/12/11 2113  . DISCONTD: albuterol (PROVENTIL) (5 MG/ML) 0.5% nebulizer solution 2.5 mg  2.5 mg Nebulization Q6H Nadara Mustard, MD   2.5 mg at 03/13/11 0150  . DISCONTD: ceFAZolin (ANCEF) IVPB 2 g/50 mL premix  2 g Intravenous 60 min Pre-Op Nadara Mustard, MD      . DISCONTD: ciprofloxacin (CIPRO) tablet 500 mg  500 mg Oral BID Nadara Mustard, MD   500 mg at 03/18/11 1037  . DISCONTD: clindamycin (CLEOCIN) IVPB 600 mg  600 mg Intravenous Q8H Nadara Mustard, MD      . DISCONTD: fentaNYL (DURAGESIC - dosed mcg/hr) 75 mcg  75 mcg  Transdermal Q72H Nadara Mustard, MD   75 mcg at 03/18/11 2245  . DISCONTD: HYDROmorphone (DILAUDID) injection 0.25-0.5 mg  0.25-0.5 mg Intravenous Q5 min PRN Raiford Simmonds, MD   0.5 mg at 03/16/11 1740  . DISCONTD: HYDROmorphone (DILAUDID) PCA injection 0.3 mg/mL   Intravenous Q4H Nadara Mustard, MD   1.2 mg at 03/18/11 1043  . DISCONTD: imipenem-cilastatin (PRIMAXIN) 500 mg in sodium chloride 0.9 % 100 mL IVPB  500 mg Intravenous Q6H Riki Rusk, PHARMD   500 mg at 03/20/11 1151  . DISCONTD: insulin aspart (novoLOG) injection 6 Units  6 Units Subcutaneous TID WC Nadara Mustard, MD   3 Units at 03/18/11 0800  . DISCONTD: lactated ringers infusion   Intravenous Continuous Charlene  Edwards, MD      . DISCONTD: metFORMIN (GLUCOPHAGE) tablet 500 mg  500 mg Oral Q supper Hilario Quarry Amend, PHARMD   500 mg at 03/17/11 1754  . DISCONTD: naloxone (NARCAN) injection 0.4 mg  0.4 mg Intravenous PRN Nadara Mustard, MD      . DISCONTD: ondansetron (ZOFRAN) injection 4 mg  4 mg Intravenous Q6H PRN Raiford Simmonds, MD      . DISCONTD: ondansetron (ZOFRAN) injection 4 mg  4 mg Intravenous Q6H PRN Nadara Mustard, MD      . DISCONTD: oxyCODONE (Oxy IR/ROXICODONE) immediate release tablet 7.5 mg  7.5 mg Oral Q6H Nadara Mustard, MD      . DISCONTD: oxyCODONE (OXYCONTIN) 12 hr tablet 15 mg  15 mg Oral Q6H PRN Nadara Mustard, MD      . DISCONTD: potassium chloride (K-DUR,KLOR-CON) CR tablet 10 mEq  10 mEq Oral Daily Nadara Mustard, MD   10 mEq at 03/21/11 1120  . DISCONTD: repaglinide-metformin (PRANDIMET) 1-500 MG per tablet 1 tablet  1 tablet Oral QAC supper Nadara Mustard, MD      . DISCONTD: sodium chloride irrigation 0.9 %    PRN Nadara Mustard, MD   1,000 mL at 03/13/11 1115  . DISCONTD: sulfamethoxazole-trimethoprim (BACTRIM DS) 800-160 MG per tablet 1 tablet  1 tablet Oral BID Nadara Mustard, MD   1 tablet at 03/12/11 [phone]  . DISCONTD: vancomycin (VANCOCIN) 1,500 mg in sodium chloride 0.9 % 500 mL IVPB  1,500 mg  Intravenous Q12H Mickeal Skinner, PHARMD   1,500 mg at 03/19/11 0801  . DISCONTD: vancomycin (VANCOCIN) IVPB 1000 mg/200 mL premix  1,000 mg Intravenous Q12H Nadara Mustard, MD      . DISCONTD: warfarin (COUMADIN) injection 5 mg  5 mg Intravenous Custom Eldred Manges      . DISCONTD: warfarin (COUMADIN) tablet 5 mg  5 mg Oral Custom Eldred Manges      . DISCONTD: warfarin (COUMADIN) tablet 5-7.5 mg  5-7.5 mg Oral Daily Nadara Mustard, MD      . DISCONTD: warfarin (COUMADIN) tablet 7.5 mg  7.5 mg Oral Daily Nadara Mustard, MD      . DISCONTD: warfarin (COUMADIN) tablet 7.5 mg  7.5 mg Oral Custom Eldred Manges       Medications Prior to Admission  Medication Sig Dispense Refill  . albuterol (PROVENTIL) (2.5 MG/3ML) 0.083% nebulizer solution Take 2.5 mg by nebulization every 6 (six) hours as needed. For shortness of breath      . clonazePAM (KLONOPIN) 0.5 MG tablet Take 0.5 mg by mouth 2 (two) times daily as needed. For anxiety      . ferrous sulfate 325 (65 FE) MG tablet Take 325 mg by mouth 3 (three) times daily.        . Fluticasone-Salmeterol (ADVAIR) 500-50 MCG/DOSE AEPB Inhale 1 puff into the lungs every 12 (twelve) hours.        . furosemide (LASIX) 40 MG tablet Take 40 mg by mouth 2 (two) times daily.        [name] levalbuterol (XOPENEX HFA) 45 MCG/ACT inhaler Inhale 1-2 puffs into the lungs every 4 (four) hours as needed. For shortness of breath      . lisinopril (PRINIVIL,ZESTRIL) 10 MG tablet Take 10 mg by mouth daily.        . montelukast (SINGULAIR) 10 MG tablet Take 10 mg by mouth at bedtime.        [name]  oxyCODONE (OXYCONTIN) 15 MG TB12 Take 7.5 mg by mouth every 6 (six) hours as needed. For pain      . potassium chloride (KLOR-CON) 10 MEQ CR tablet Take 10 mEq by mouth daily.        . pravastatin (PRAVACHOL) 10 MG tablet Take 10 mg by mouth daily.        . repaglinide-metformin (PRANDIMET) 1-500 MG tablet Take 1 tablet by mouth daily before supper.        . tiotropium (SPIRIVA) 18 MCG  inhalation capsule Place 18 mcg into inhaler and inhale daily.        [name] doxycycline (VIBRAMYCIN) 50 MG capsule Take 2 capsules (100 mg total) by mouth 2 (two) times daily.  60 capsule  0  . fentaNYL (DURAGESIC - DOSED MCG/HR) 50 MCG/HR Place 1 patch (50 mcg total) onto the skin every 3 (three) days.  10 patch  0  . oxyCODONE 7.5 MG TABS Take 7.5 mg by mouth every 6 (six) hours as needed.  30 tablet  0     Lab Results: UA: pending No results found for this basename: WBC:3,HGB:3,HCT:3,PLT:3 in the last 72 hours BMET  [facility] 03/21/11 0540 03/20/11 0535  NA 141 142  K 5.4* 4.8  CL 99 101  CO2 33* 28  GLUCOSE 108* 89  BUN 62* 63*  CREATININE 3.31* 3.57*  CALCIUM 9.2 8.4  PHOS -- --   LFT No results found for this basename: PROT,ALBUMIN,AST,ALT,ALKPHOS,BILITOT,BILIDIR,IBILI in the last 72 hours No results found.  ROS: appetite good No SOB No CP No recent chang in bowels No dysuria Pain in left leg from ulcer No change in vision  PHYSICAL EXAM: Blood pressure 117/48, pulse 66, temperature 98.2 F (36.8 C), temperature source Axillary, resp. rate 20, height 5\' 11"  (1.803 m), weight 156.491 kg (345 lb), SpO2 99.00%. HEENT: PERRLA, EOMI NECK:No JVD LUNGS:clear CARDIAC:RRR without M,R,G ABD:Pos BS NT ND no HSM EXT:No edema. Ulcer left lower leg with bandage covering it NEURO:O x 3 CNI  M&S intact  Assessment: 1. ARF most likely secondary to low BP in face of lisinopril.  Less likely is vanco as he has not been on it that long though his level yest was 57.8 2. Hyperkalemia 3. Chronic leg ulcer PLAN: 1. DC lisinopril 2. Dc lasix 3. IV at 75cc/hr until current bag done 4. Kayexalate 30 gm x 1 5. Renal diet 6. Check UA 7. Would not DC today discussed with DR Lajoyce Corners 8. Renal US 9. Recheck renal function in AM 10. Check trough vanco in AM   Emmalynn Pinkham T 03/21/2011, 3:02 PM

## 2011-03-21 NOTE — Progress Notes (Signed)
Physical Therapy Evaluation Patient Details Name: Jeremy [name] MRN: [id number] DOB: [birth year] Today's Date: 03/21/2011  Problem List:  Patient Active Problem List  Diagnoses  . 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 Diabetes mellitus   . COPD (chronic obstructive pulmonary disease)   . GERD (gastroesophageal reflux disease)   . Asthma   . Shortness of breath   . Leg pain   . CHF (congestive heart failure)   . Peripheral vascular disease   . Sleep apnea     uses bipap, sleep study done in [place] 2 years ago, dr Cala Bradford byrd  . Anemia   . Headache     sinus headaches  . Cancer     benign skin cancer   Past Surgical History:  Past Surgical History  Procedure Date  . Arm surgery   . Vein ligation and stripping   . Tummy tuck   . Stomach stapling   . Skin grafts   . I&d extremity 03/13/2011    Procedure: IRRIGATION AND DEBRIDEMENT EXTREMITY;  Surgeon: Nadara Mustard, MD;  Location: [facility] OR;  Service: Orthopedics;  Laterality: Left;    PT Assessment/Plan/Recommendation PT Assessment Clinical Impression Statement: 60 yo male with chronic LLe ulcer present with functional dependencies, decr endurance secondary to effects of bedrest, and impairments listed below; will benefit from acute PT services to maximize I and safety with mobility/amb/transfers and enable safe dc home  PT Recommendation/Assessment: Patient will need skilled PT in the acute care venue PT Problem List: Decreased strength;Decreased range of motion;Decreased balance;Decreased activity tolerance;Decreased mobility;Decreased knowledge of use of DME;Impaired sensation;Obesity;Pain;Decreased skin integrity Barriers to Discharge: Decreased caregiver support (wife works) PT Therapy Diagnosis : Difficulty walking;Abnormality of gait;Generalized weakness;Acute pain PT Plan PT Frequency: Min 3X/week PT Treatment/Interventions: DME  instruction;Gait training;Functional mobility training;Therapeutic exercise;Balance training;Patient/family education;Wheelchair mobility training PT Recommendation Recommendations for Other Services: OT consult Follow Up Recommendations: Home health PT Equipment Recommended: Rolling walker with 5" wheels;3 in 1 bedside comode;Wheelchair (measurements) (wide equipment; wheelchair with Heritage manager) PT Goals  Acute Rehab PT Goals PT Goal Formulation: With patient Time For Goal Achievement: 2 weeks Pt will go Supine/Side to Sit: with modified independence PT Goal: Supine/Side to Sit - Progress: Progressing toward goal Pt will go Sit to Supine/Side: with modified independence PT Goal: Sit to Supine/Side - Progress: Progressing toward goal Pt will Transfer Sit to Stand/Stand to Sit: with modified independence PT Transfer Goal: Sit to Stand/Stand to Sit - Progress: Progressing toward goal Pt will Transfer Bed to Chair/Chair to Bed: with modified independence PT Transfer Goal: Bed to Chair/Chair to Bed - Progress: Progressing toward goal Pt will Ambulate: 16 - 50 feet;with modified independence;with rolling walker PT Goal: Ambulate - Progress: Progressing toward goal Pt will Perform Home Exercise Program: Independently PT Goal: Perform Home Exercise Program - Progress: Other (comment) Pt will Propel Wheelchair: 51 - 150 feet;with modified independence (and Ily manage brakes, legrests) PT Goal: Propel Wheelchair - Progress: Other (comment)  PT Evaluation Precautions/Restrictions  Precautions Precautions: Fall Precaution Comments: Contact MRSA Restrictions Weight Bearing Restrictions: Yes LLE Weight Bearing: Weight bearing as tolerated (RN obtained WBAT order from Dr. Lajoyce Corners ) Prior Functioning  Home Living Lives With: Spouse Receives Help From: Family Type of Home: House Home Layout: One level Home Access: Level entry Home Adaptive Equipment: None Prior Function Level of  Independence: Other (comment) (recently spending more time in bed; amb only  short distances) Vocation: Retired Producer, television/film/video: Awake/alert Overall Cognitive Status: Appears within functional limits for tasks assessed Orientation Level: Oriented X4 Sensation/Coordination Coordination Gross Motor Movements are Fluid and Coordinated: Yes Fine Motor Movements are Fluid and Coordinated: Yes Extremity Assessment RUE Assessment RUE Assessment: Within Functional Limits LUE Assessment LUE Assessment: Within Functional Limits RLE Assessment RLE Assessment: Exceptions to [facility] RLE Strength RLE Overall Strength Comments: decreased muscle strength and endurance as pt had to sit unexpectedly LLE Assessment LLE Assessment: Exceptions to [facility] LLE Strength LLE Overall Strength Comments: decr ROM and strength, limitted by pain Mobility (including Balance) Bed Mobility Bed Mobility: Yes Supine to Sit: 4: Min assist;With rails;HOB elevated (Comment degrees) Supine to Sit Details (indicate cue type and reason): pretty smooth progression, and efficient movement Transfers Transfers: Yes Sit to Stand: 4: Min assist;From bed;With upper extremity assist;Other (comment) (times 2 reps) Sit to Stand Details (indicate cue type and reason): tended to pull up on RW despite cues for safe hand placement; required physical assist to steady RW Stand to Sit: 4: Min assist;To chair/3-in-1;To bed;With armrests;With upper extremity assist Stand to Sit Details: cues for safe hand placement and control Ambulation/Gait Ambulation/Gait: Yes Ambulation/Gait Assistance: 4: Min assist (plus 1 for IV and O2) Ambulation/Gait Assistance Details (indicate cue type and reason): pivot steps bed to chair, and 4 ft forward; cues for sequence and to push into Rw with arms to unweigh painful LLE Ambulation Distance (Feet): 6 Feet Assistive device: Rolling walker Gait Pattern: Decreased step length -  right;Decreased step length - left;Decreased stance time - left    Exercise    End of Session PT - End of Session Equipment Utilized During Treatment: Gait belt Activity Tolerance: Patient limited by fatigue Patient left: in chair;with call bell in reach;with family/visitor present Nurse Communication: Mobility status for transfers;Mobility status for ambulation General Behavior During Session: [facility] for tasks performed Cognition: [facility] for tasks performed  Van Clines [facility] [place], [place] [phone]  03/21/2011, 4:46 PM

## 2011-03-22 LAB — RENAL FUNCTION PANEL
BUN: 55 mg/dL — ABNORMAL HIGH (ref 6–23)
CO2: 31 mEq/L (ref 19–32)
Calcium: 8.8 mg/dL (ref 8.4–10.5)
Chloride: 100 mEq/L (ref 96–112)
Creatinine, Ser: 3.03 mg/dL — ABNORMAL HIGH (ref 0.50–1.35)
Glucose, Bld: 109 mg/dL — ABNORMAL HIGH (ref 70–99)

## 2011-03-22 LAB — GLUCOSE, CAPILLARY
Glucose-Capillary: 100 mg/dL — ABNORMAL HIGH (ref 70–99)
Glucose-Capillary: 110 mg/dL — ABNORMAL HIGH (ref 70–99)
Glucose-Capillary: 92 mg/dL (ref 70–99)

## 2011-03-22 LAB — PROTIME-INR: INR: 2.54 — ABNORMAL HIGH (ref 0.00–1.49)

## 2011-03-22 MED ORDER — WARFARIN SODIUM 2.5 MG PO TABS
2.5000 mg | ORAL_TABLET | Freq: Once | ORAL | Status: AC
  Administered 2011-03-22: 2.5 mg via ORAL
  Filled 2011-03-22: qty 1

## 2011-03-22 NOTE — Progress Notes (Signed)
Patient ID: Jeremy [name], male   DOB: 01/04/1951, 60 y.o.   MRN: [id number] Patient was seen in consultation with nephrology for patient's elevated renal function studies. The sock was changed today. Patient has improved granulation tissue crease drainage decreased pain. Plan for discharge as prior recommendations from nephrology. Patient will followup in my office next week.

## 2011-03-22 NOTE — Progress Notes (Addendum)
Subjective: Awake, alert, wonders when he can go home.  Wife at bedside  Objective: Vital signs in last 24 hours: Blood pressure 129/65, pulse 64, temperature 98.9 F (37.2 C), temperature source Axillary, resp. rate 20, height 5\' 11"  (1.803 m), weight 156.491 kg (345 lb), SpO2 96.00%.   Intake/Output from previous day: 11/29 0701 - 11/30 0700 In: 1440 [P.O.:1200; I.V.:240] Out: 600 [Urine:600] Intake/Output this shift: Total I/O In: 240 [P.O.:240] Out: -  Wt Readings from Last 3 Encounters:  03/12/11 156.491 kg (345 lb)  03/12/11 156.491 kg (345 lb)  12/03/10 154.223 kg (340 lb)     PHYSICAL EXAM General--awake, alert Chest--clear  Heart--no rub Abd--protuberant, nontender Extr--no edema on R.  Compression sock on L Le  Lab Results:   Lab 03/22/11 0725 03/21/11 0540 03/20/11 0535  NA 141 141 142  K 4.9 5.4* 4.8  CL 100 99 101  CO2 31 33* 28  BUN 55* 62* 63*  CREATININE 3.03* 3.31* 3.57*  EGFR -- -- --  GLUCOSE 109* -- --  CALCIUM 8.8 9.2 8.4  PHOS 5.4* -- --    Renal Ultrasound-- NO HYDRONEPHROSIS UA; SG 1.011, pH 7, dipstick neg except for +bili  Vancomycin level still high36.9 today No results found for this basename: WBC:2,HGB:2,HCT:2,PLT:2 in the last 72 hours  Scheduled: Continuous:   . sodium chloride 1,000 mL (03/22/11 1539)    Assessment/Plan:   1. ARF--from low BP, lisinopril, and probably high Vanco levels 2.  Hyperkalemia 3. Chronic leg ulcer 4. 4. Anemia PLAN:  1. Continue IVF for 1 more day 2. Hold lisinopril until renal fct. returns to baseline,  BP OK now 3. Check lab in AM 4. 4.  Check Fe studies inAM   LOS: 10 days   Jeremy [name] F 03/22/2011,4:33 PM

## 2011-03-22 NOTE — Progress Notes (Addendum)
MEDICATION RELATED CONSULT NOTE - FOLLOW UP   Pharmacy Consult for Coumadin/vancomycin Indication: History of DVT/leg ulcer  No Known Allergies  Patient Measurements: Height: 5\' 11"  (180.3 cm) Weight: 345 lb (156.491 kg) IBW/kg (Calculated) : 75.3   Vital Signs: Temp: 97.8 F (36.6 C) (11/30 0614) BP: 127/58 mmHg (11/30 0614) Pulse Rate: 76  (11/30 0614) Intake/Output from previous day: 11/29 0701 - 11/30 0700 In: 1440 [P.O.:1200; I.V.:240] Out: 600 [Urine:600]      Labs:  Basename 03/22/11 0725 03/21/11 0540 03/20/11 0535  WBC -- -- --  HGB -- -- --  HCT -- -- --  PLT -- -- --  APTT -- -- --  CREATININE 3.03* 3.31* 3.57*  LABCREA -- -- --  CREATININE 3.03* 3.31* 3.57*  CREAT24HRUR -- -- --  MG -- -- --  PHOS 5.4* -- --  ALBUMIN 2.6* -- --  PROT -- -- --  ALBUMIN 2.6* -- --  AST -- -- --  ALT -- -- --  ALKPHOS -- -- --  BILITOT -- -- --  BILIDIR -- -- --  IBILI -- -- --   Estimated Creatinine Clearance: 40 ml/min (by C-G formula based on Cr of 3.03).   Microbiology: Recent Results (from the past 720 hour(s))  SURGICAL PCR SCREEN     Status: Abnormal   Collection Time   03/12/11 12:33 PM      Component Value Range Status Comment   MRSA, PCR POSITIVE (*) NEGATIVE  Final    Staphylococcus aureus POSITIVE (*) NEGATIVE  Final    Assessment: 60 YOM on coumadin for history of DVT, INR therapeutic, no new cbc since 11/23, no bleeding noted.  Pt. Is also on vancomycin and primaxin for leg ulcer, vanc on hold since 11/27 due to acute renal injury.   Vancomycin levels:  11/28 at 0535 = 57.8,  11/29 at 1806 = 46.2 11/30 at 0725 = 36.9.   Current vancomycin level is still supratherapeutic. calculated half life ~ 40hrs which is shorter than calculated base on calculated with previous levels (77hrs). Scr is also trending down. Renal is following pt. as well. primaxin dose ok for current renal function. Probably will not need to re-dose vancomycin until Sunday.       Goal of Therapy:  Vancomycin level 15-20  Plan:  1) continue to hold vancomycin 2) f/u scr trend. No need to check level tomorrow. 3) will get random vancomycin level on Sunday if continue on vancomycin therapy.  Riki Rusk 03/22/2011,1:45 PM  4) coumadin 2.5mg  po x 1 today

## 2011-03-23 LAB — FERRITIN: Ferritin: 220 ng/mL (ref 22–322)

## 2011-03-23 LAB — IRON AND TIBC
Iron: 46 ug/dL (ref 42–135)
TIBC: 254 ug/dL (ref 215–435)
UIBC: 208 ug/dL (ref 125–400)

## 2011-03-23 LAB — GLUCOSE, CAPILLARY
Glucose-Capillary: 83 mg/dL (ref 70–99)
Glucose-Capillary: 90 mg/dL (ref 70–99)
Glucose-Capillary: 70 mg/dL (ref 70–99)

## 2011-03-23 LAB — CBC
HCT: 30.6 % — ABNORMAL LOW (ref 39.0–52.0)
Hemoglobin: 9.9 g/dL — ABNORMAL LOW (ref 13.0–17.0)
MCH: 29.3 pg (ref 26.0–34.0)
MCHC: 32.4 g/dL (ref 30.0–36.0)
RBC: 3.38 MIL/uL — ABNORMAL LOW (ref 4.22–5.81)

## 2011-03-23 LAB — RENAL FUNCTION PANEL
CO2: 31 mEq/L (ref 19–32)
Calcium: 8.8 mg/dL (ref 8.4–10.5)
Chloride: 101 mEq/L (ref 96–112)
Creatinine, Ser: 2.61 mg/dL — ABNORMAL HIGH (ref 0.50–1.35)
GFR calc non Af Amer: 25 mL/min — ABNORMAL LOW (ref >90)
Glucose, Bld: 99 mg/dL (ref 70–99)

## 2011-03-23 MED ORDER — WARFARIN SODIUM 2.5 MG PO TABS
2.5000 mg | ORAL_TABLET | Freq: Once | ORAL | Status: AC
  Administered 2011-03-23: 2.5 mg via ORAL
  Filled 2011-03-23: qty 1

## 2011-03-23 NOTE — Progress Notes (Signed)
There is an order to ambulate pt.  Around 2200 NT was assisting pt. to ambulate and his left leg began to bleed.  I reinforced compression sock with abd dressing  X 2 and kerlex.

## 2011-03-23 NOTE — Progress Notes (Signed)
Subjective: U. Revealed you receivept stable - drainage on sock   Objective: Vital signs in last 24 hours: Temp:  [98.3 F (36.8 C)-98.9 F (37.2 C)] 98.3 F (36.8 C) (12/01 0650) Pulse Rate:  [64-84] 84  (12/01 0817) Resp:  [16-20] 18  (12/01 0817) BP: (129-130)/(65-69) 130/68 mmHg (12/01 0650) SpO2:  [94 %-99 %] 95 % (12/01 0817) FiO2 (%):  [36 %] 36 % (11/30 1002)  Intake/Output from previous day: 11/30 0701 - 12/01 0700 In: 720 [P.O.:720] Out: 2000 [Urine:2000] Intake/Output this shift:    Exam:  Compartment soft  Labs:  Basename 03/23/11 0500  HGB 9.9*    Basename 03/23/11 0500  WBC 8.0  RBC 3.38*  HCT 30.6*  PLT 212    Basename 03/23/11 0500 03/22/11 0725  NA 140 141  K 4.8 4.9  CL 101 100  CO2 31 31  BUN 47* 55*  CREATININE 2.61* 3.03*  GLUCOSE 99 109*  CALCIUM 8.8 8.8    Basename 03/23/11 0500 03/22/11 0725  LABPT -- --  INR 2.43* 2.54*    Assessment/Plan: Use of the physical versus leukemia fluencies recently and 30 you in his wound the erythema but it is a listhesis be restarted yesterday when he was placed on Tegretol knee reflexes at home and was still full Carbatrol of the same thing to do what she wants a stool for her full TED hose as is the increasing sore throat and to 5 and a tertiary referral  Jeremy [name] Jeremy [name] 03/23/2011, 9:41 AM

## 2011-03-23 NOTE — Progress Notes (Signed)
The computer system has major malfunction issues with the note writing as can be seen in the previous note - i changed his sock today - plan to do the same tomorrow - i have alerted the computer people as to the problem the note in the system on this patient obviously does not reflect the encounter

## 2011-03-23 NOTE — Progress Notes (Signed)
ANTICOAGULATION CONSULT NOTE - Follow Up Consult  Pharmacy Consult for coumadin Indication: History of DVT  No Known Allergies  Patient Measurements: Height: 5\' 11"  (180.3 cm) Weight: 345 lb (156.491 kg) IBW/kg (Calculated) : 75.3   Vital Signs: Temp: 98.3 F (36.8 C) (12/01 0650) BP: 130/68 mmHg (12/01 0650) Pulse Rate: 84  (12/01 0817)  Labs:  Basename 03/23/11 0500 03/22/11 0725 03/21/11 0540  HGB 9.9* -- --  HCT 30.6* -- --  PLT 212 -- --  APTT -- -- --  LABPROT 26.8* 27.8* 24.6*  INR 2.43* 2.54* 2.18*  HEPARINUNFRC -- -- --  CREATININE 2.61* 3.03* 3.31*  CKTOTAL -- -- --  CKMB -- -- --  TROPONINI -- -- --   Estimated Creatinine Clearance: 46.5 ml/min (by C-G formula based on Cr of 2.61).   Medications:  Scheduled:    . fentaNYL  50 mcg Transdermal Q72H  . ferrous sulfate  325 mg Oral TID  . Fluticasone-Salmeterol  1 puff Inhalation Q12H  . gabapentin  300 mg Oral TID  . imipenem-cilastatin  250 mg Intravenous Q6H  . insulin aspart  0-20 Units Subcutaneous TID WC  . insulin aspart  3 Units Subcutaneous TID WC  . montelukast  10 mg Oral QHS  . repaglinide  1 mg Oral Q supper  . simvastatin  5 mg Oral q1800  . tiotropium  18 mcg Inhalation Daily  . warfarin  2.5 mg Oral ONCE-1800    Assessment: 60 YOM on coumadin for history of DVT, INR therapeutic, hgb 9.9, plt 212, no bleeding noted.  Goal of Therapy:  INR 2-3   Plan:  coumadin 2.5mg  po x 1 today   Riki Rusk 03/23/2011,12:33 PM

## 2011-03-24 LAB — RENAL FUNCTION PANEL
Albumin: 2.6 g/dL — ABNORMAL LOW (ref 3.5–5.2)
CO2: 30 mEq/L (ref 19–32)
Calcium: 8.7 mg/dL (ref 8.4–10.5)
Chloride: 98 mEq/L (ref 96–112)
Creatinine, Ser: 2.5 mg/dL — ABNORMAL HIGH (ref 0.50–1.35)
GFR calc Af Amer: 31 mL/min — ABNORMAL LOW (ref >90)
GFR calc non Af Amer: 27 mL/min — ABNORMAL LOW (ref >90)
Sodium: 136 mEq/L (ref 135–145)

## 2011-03-24 LAB — GLUCOSE, CAPILLARY
Glucose-Capillary: 145 mg/dL — ABNORMAL HIGH (ref 70–99)
Glucose-Capillary: 87 mg/dL (ref 70–99)
Glucose-Capillary: 71 mg/dL (ref 70–99)

## 2011-03-24 LAB — VANCOMYCIN, RANDOM: Vancomycin Rm: 17.3 ug/mL

## 2011-03-24 LAB — PROTIME-INR: Prothrombin Time: 26.9 seconds — ABNORMAL HIGH (ref 11.6–15.2)

## 2011-03-24 MED ORDER — SODIUM CHLORIDE 0.9 % IV SOLN
INTRAVENOUS | Status: DC
  Administered 2011-03-25 (×2): via INTRAVENOUS

## 2011-03-24 MED ORDER — SODIUM CHLORIDE 0.9 % IV SOLN
500.0000 mg | Freq: Three times a day (TID) | INTRAVENOUS | Status: DC
  Administered 2011-03-25 (×2): 500 mg via INTRAVENOUS
  Filled 2011-03-24 (×5): qty 500

## 2011-03-24 MED ORDER — WARFARIN SODIUM 2.5 MG PO TABS
2.5000 mg | ORAL_TABLET | Freq: Once | ORAL | Status: AC
  Administered 2011-03-24: 2.5 mg via ORAL
  Filled 2011-03-24: qty 1

## 2011-03-24 NOTE — Consult Note (Signed)
Subjective: Good UOP with 1500 cc yesterday.  Creat down to 2.6.  No complaints.  Objective Vital signs in last 24 hours: Filed Vitals:   03/23/11 1300 03/23/11 1932 03/23/11 2150 03/23/11 2349  BP: 129/73  105/49   Pulse: 59  61 64  Temp: 98.4 F (36.9 C)  97.7 F (36.5 C)   TempSrc:      Resp: 18  16 18   Height:      Weight:      SpO2: 98% 98% 99% 98%   Weight change:   Intake/Output Summary (Last 24 hours) at 03/24/11 0032 Last data filed at 03/23/11 2300  Gross per 24 hour  Intake   2064 ml  Output   2500 ml  Net   -436 ml   Labs: Basic Metabolic Panel:  Lab 03/23/11 4098 03/22/11 0725 03/21/11 0540 03/20/11 0535  NA 140 141 141 142  K 4.8 4.9 5.4* 4.8  CL 101 100 99 101  CO2 31 31 33* 28  GLUCOSE 99 109* 108* 89  BUN 47* 55* 62* 63*  CREATININE 2.61* 3.03* 3.31* 3.57*  ALB -- -- -- --  CALCIUM 8.8 8.8 9.2 8.4  PHOS 5.2* 5.4* -- --   Liver Function Tests:  Lab 03/23/11 0500 03/22/11 0725  AST -- --  ALT -- --  ALKPHOS -- --  BILITOT -- --  PROT -- --  ALBUMIN 2.6* 2.6*   No results found for this basename: LIPASE:3,AMYLASE:3 in the last 168 hours No results found for this basename: AMMONIA:3 in the last 168 hours CBC:  Lab 03/23/11 0500  WBC 8.0  NEUTROABS --  HGB 9.9*  HCT 30.6*  MCV 90.5  PLT 212   PT/INR: @labrcntip (inr:5) Cardiac Enzymes: No results found for this basename: CKTOTAL:5,CKMB:5,CKMBINDEX:5,TROPONINI:5 in the last 168 hours CBG:  Lab 03/23/11 2241 03/23/11 1630 03/23/11 1123 03/23/11 0727 03/22/11 2202  GLUCAP 70 90 111* 113* 83    Iron Studies:  Lab 03/23/11 0500  IRON 46  TIBC 254  TRANSFERRIN --  FERRITIN 220   Studies/Results: No results found. Medications:    . sodium chloride 1,000 mL (03/22/11 1539)      . fentaNYL  50 mcg Transdermal Q72H  . ferrous sulfate  325 mg Oral TID  . Fluticasone-Salmeterol  1 puff Inhalation Q12H  . gabapentin  300 mg Oral TID  . imipenem-cilastatin  250 mg Intravenous  Q6H  . insulin aspart  0-20 Units Subcutaneous TID WC  . insulin aspart  3 Units Subcutaneous TID WC  . montelukast  10 mg Oral QHS  . repaglinide  1 mg Oral Q supper  . simvastatin  5 mg Oral q1800  . tiotropium  18 mcg Inhalation Daily  . warfarin  2.5 mg Oral ONCE-1800    I  have reviewed scheduled and prn medications.  Physical Exam: General: alert, obese, no distress Heart: reg no rub Lungs: clear bilat, no rales Abdomen: obese Extremities: no edema, compression stocking on left leg Neuro: alert   Problem/Plan: 1. AKI - related to low BP, ACEI, high vanc levels.  Resolving with declining creatinine.  Check labs in am.  2. Chronic leg ulcer  3. Anemia 4. Obesity 5. DVT on coumadin 6.  DM 2    Blakeley Scheier D  03/24/2011,12:32 AM  LOS: 12 days  Cell #  [phone]

## 2011-03-24 NOTE — Progress Notes (Signed)
Pt stable -- sock changed for second day Cr inching up Cont wound care - abx per mvd

## 2011-03-24 NOTE — Progress Notes (Signed)
Subjective: 2100 cc UOP yesterday, continues to increase.  Creat down to 2.5.  No complaints.  Objective Vital signs in last 24 hours: Filed Vitals:   03/23/11 2150 03/23/11 2349 03/24/11 0530 03/24/11 1330  BP: 105/49  117/58 115/63  Pulse: 61 64 55 64  Temp: 97.7 F (36.5 C)  98.6 F (37 C) 99.1 F (37.3 C)  TempSrc:      Resp: 16 18 16 18   Height:      Weight:      SpO2: 99% 98% 99% 98%   Weight change:   Intake/Output Summary (Last 24 hours) at 03/24/11 1748 Last data filed at 03/24/11 1400  Gross per 24 hour  Intake   1829 ml  Output   2000 ml  Net   -171 ml   Labs: Basic Metabolic Panel:  Lab 03/24/11 4132 03/23/11 0500 03/22/11 0725 03/21/11 0540 03/20/11 0535  NA 136 140 141 141 142  K 4.4 4.8 4.9 5.4* 4.8  CL 98 101 100 99 101  CO2 30 31 31  33* 28  GLUCOSE 101* 99 109* 108* 89  BUN 46* 47* 55* 62* 63*  CREATININE 2.50* 2.61* 3.03* 3.31* 3.57*  ALB -- -- -- -- --  CALCIUM 8.7 8.8 8.8 9.2 8.4  PHOS 5.4* 5.2* 5.4* -- --   Liver Function Tests:  Lab 03/24/11 0600 03/23/11 0500 03/22/11 0725  AST -- -- --  ALT -- -- --  ALKPHOS -- -- --  BILITOT -- -- --  PROT -- -- --  ALBUMIN 2.6* 2.6* 2.6*   No results found for this basename: LIPASE:3,AMYLASE:3 in the last 168 hours No results found for this basename: AMMONIA:3 in the last 168 hours CBC:  Lab 03/23/11 0500  WBC 8.0  NEUTROABS --  HGB 9.9*  HCT 30.6*  MCV 90.5  PLT 212   PT/INR: @labrcntip (inr:5) Cardiac Enzymes: No results found for this basename: CKTOTAL:5,CKMB:5,CKMBINDEX:5,TROPONINI:5 in the last 168 hours CBG:  Lab 03/24/11 1616 03/24/11 1114 03/24/11 0726 03/23/11 2241 03/23/11 1630  GLUCAP 87 145* 98 70 90    Iron Studies:   Lab 03/23/11 0500  IRON 46  TIBC 254  TRANSFERRIN --  FERRITIN 220   Studies/Results: No results found. Medications:      . fentaNYL  50 mcg Transdermal Q72H  . ferrous sulfate  325 mg Oral TID  . Fluticasone-Salmeterol  1 puff Inhalation Q12H   . gabapentin  300 mg Oral TID  . imipenem-cilastatin  500 mg Intravenous Q8H  . insulin aspart  0-20 Units Subcutaneous TID WC  . insulin aspart  3 Units Subcutaneous TID WC  . montelukast  10 mg Oral QHS  . repaglinide  1 mg Oral Q supper  . simvastatin  5 mg Oral q1800  . tiotropium  18 mcg Inhalation Daily  . warfarin  2.5 mg Oral ONCE-1800  . DISCONTD: imipenem-cilastatin  250 mg Intravenous Q6H    I  have reviewed scheduled and prn medications.  Physical Exam: General: alert, obese, no distress Heart: reg no rub Lungs: clear bilat, no rales Abdomen: obese Extremities: no edema, compression stocking on left leg Neuro: alert   Problem/Plan: 1. AKI, related to low BP, ACEI, high vanc levels.  Resolving with declining creatinine. Pt anxious to go home, but wouldn't recommend that until creat levels are better.  Would continue IVF's at 75cc/hr.   2. Chronic leg ulcer, s/p I&D by Dr. Lajoyce Corners. On IV abx.   3. Anemia 4. Obesity 5. DVT on  coumadin 6.  DM 2    Analyssa Downs D  03/24/2011,5:48 PM  LOS: 12 days  Cell #  [phone]

## 2011-03-24 NOTE — Progress Notes (Signed)
ANTIBIOTIC CONSULT NOTE - FOLLOW UP  Pharmacy Consult for Coumadin/Vancomycin/Primaxin Indication: History of DVT/ leg ulcer  No Known Allergies  Patient Measurements: Height: 5\' 11"  (180.3 cm) Weight: 345 lb (156.491 kg) IBW/kg (Calculated) : 75.3   Vital Signs: Temp: 98.6 F (37 C) (12/02 0530) BP: 117/58 mmHg (12/02 0530) Pulse Rate: 55  (12/02 0530) Intake/Output from previous day: 12/01 0701 - 12/02 0700 In: 3053 [P.O.:1080; I.V.:784; IV Piggyback:400] Out: 2100 [Urine:2100]   Urine output: 0.71ml/kg/hr  Labs:  Basename 03/24/11 0600 03/23/11 0500 03/22/11 0725  WBC -- 8.0 --  HGB -- 9.9* --  PLT -- 212 --  LABCREA -- -- --  CREATININE 2.50* 2.61* 3.03*   Estimated Creatinine Clearance: 48.5 ml/min (by C-G formula based on Cr of 2.5).  Basename 03/24/11 0600 03/22/11 0725  VANCOTROUGH -- --  VANCOPEAK -- --  VANCORANDOM 17.3 36.9  GENTTROUGH -- --  GENTPEAK -- --  GENTRANDOM -- --  TOBRATROUGH -- --  TOBRAPEAK -- --  TOBRARND -- --  AMIKACINPEAK -- --  AMIKACINTROU -- --  AMIKACIN -- --     Microbiology: Recent Results (from the past 720 hour(s))  SURGICAL PCR SCREEN     Status: Abnormal   Collection Time   03/12/11 12:33 PM      Component Value Range Status Comment   MRSA, PCR POSITIVE (*) NEGATIVE  Final    Staphylococcus aureus POSITIVE (*) NEGATIVE  Final     Anti-infectives     Start     Dose/Rate Route Frequency Ordered Stop   03/21/11 0000   doxycycline (VIBRAMYCIN) 50 MG capsule        100 mg Oral 2 times daily 03/21/11 0632 03/31/11 2359   03/20/11 1200   imipenem-cilastatin (PRIMAXIN) 250 mg in sodium chloride 0.9 % 100 mL IVPB        250 mg 200 mL/hr over 30 Minutes Intravenous 4 times per day 03/20/11 1038     03/18/11 1200   imipenem-cilastatin (PRIMAXIN) 500 mg in sodium chloride 0.9 % 100 mL IVPB  Status:  Discontinued        500 mg 200 mL/hr over 30 Minutes Intravenous 4 times per day 03/18/11 0931 03/20/11 1036   03/13/11  1330   vancomycin (VANCOCIN) IVPB 1000 mg/200 mL premix  Status:  Discontinued        1,000 mg 200 mL/hr over 60 Minutes Intravenous Every 12 hours 03/13/11 1321 03/13/11 1337   03/13/11 1000   vancomycin (VANCOCIN) 1,500 mg in sodium chloride 0.9 % 500 mL IVPB  Status:  Discontinued        1,500 mg 250 mL/hr over 120 Minutes Intravenous Every 12 hours 03/12/11 2110 03/19/11 2103   03/12/11 2315   ciprofloxacin (CIPRO) tablet 500 mg  Status:  Discontinued        500 mg Oral 2 times daily 03/12/11 2237 03/18/11 1821   03/12/11 2245   sulfamethoxazole-trimethoprim (BACTRIM DS) 800-160 MG per tablet 1 tablet  Status:  Discontinued        1 tablet Oral 2 times daily 03/12/11 2237 03/13/11 0836   03/12/11 2200   vancomycin (VANCOCIN) 2,500 mg in sodium chloride 0.9 % 500 mL IVPB        2,500 mg 250 mL/hr over 120 Minutes Intravenous  Once 03/12/11 2110 03/13/11 0100   03/12/11 2015   clindamycin (CLEOCIN) IVPB 600 mg  Status:  Discontinued        600 mg 100 mL/hr over 30 Minutes  Intravenous 3 times per day 03/12/11 2011 03/12/11 2048   03/12/11 1228   ceFAZolin (ANCEF) IVPB 2 g/50 mL premix  Status:  Discontinued        2 g 100 mL/hr over 30 Minutes Intravenous 60 min pre-op 03/12/11 1228 03/12/11 2011          Assessment: 60 YOM on coumadin for history of DVT, INR therapeutic and stable, no bleeding noted.  Pt. was also on vancomycin D#12 and primaxin D#7 for leg ulcer, vanc on hold since 11/27 due to acute renal injury 2/2 low bp/lisinopril/high vanc level, ARF now revovering. Vanc random level therapeutic 17.3 this morning, renal fx improving. scr down 2.5, est crcl 49, UOP 0.11ml/kg/hr. est. vanc half-life ~48hrs base on 2 most recentl levels. I am not clear if orthopedics MD is still want to continuing vancomycin. I am concerned about re-dosing vancomycin may worsen his renal function since it is not fully recovered yet. will not redose for now. estimated level around 12 tomorrow am.  Left MD a note to discuss if we should continue vancomycin or switch to an different antibiotics that covers MRSA such as clindamycin. will f/u MD decision for futher plans.   Goal of Therapy:  Vancomycin trough level 15-20 mcg/ml  Plan:  1. Coumadin 2.5mg  x1 today 2. Change primaxin to 500mg  IV Q8 3. No change for vancomycin, f/u MD note for further decision.  Riki Rusk 03/24/2011,11:33 AM

## 2011-03-25 LAB — RENAL FUNCTION PANEL
BUN: 41 mg/dL — ABNORMAL HIGH (ref 6–23)
CO2: 26 mEq/L (ref 19–32)
GFR calc Af Amer: 35 mL/min — ABNORMAL LOW (ref >90)
Glucose, Bld: 91 mg/dL (ref 70–99)
Phosphorus: 5 mg/dL — ABNORMAL HIGH (ref 2.3–4.6)
Potassium: 4.4 mEq/L (ref 3.5–5.1)
Sodium: 141 mEq/L (ref 135–145)

## 2011-03-25 LAB — GLUCOSE, CAPILLARY
Glucose-Capillary: 130 mg/dL — ABNORMAL HIGH (ref 70–99)
Glucose-Capillary: 147 mg/dL — ABNORMAL HIGH (ref 70–99)
Glucose-Capillary: 93 mg/dL (ref 70–99)
Glucose-Capillary: 68 mg/dL — ABNORMAL LOW (ref 70–99)

## 2011-03-25 MED ORDER — WARFARIN SODIUM 3 MG PO TABS
3.0000 mg | ORAL_TABLET | Freq: Every day | ORAL | Status: DC
  Administered 2011-03-25: 3 mg via ORAL
  Filled 2011-03-25 (×2): qty 1

## 2011-03-25 NOTE — Progress Notes (Signed)
Physical Therapy Treatment Patient Details Name: Jeremy [name] MRN: [id number] DOB: Mar 30, 1951 Today's Date: 03/25/2011  PT Assessment/Plan  PT - Assessment/Plan Comments on Treatment Session: pt educated on keeping LLE elevated unless actively walking from [place] to [place]; also emphasized the need to increase pt's activity level in prep for dc home; good progress today; discussed potential benefit of having wheelchair with elevating legrest for MD appt's and community access PT Plan: Equipment recommendations need to be updated;Discharge plan remains appropriate PT Frequency: Min 3X/week Follow Up Recommendations: Home health PT Equipment Recommended: Wheelchair (measurements) (Wide with elevating legrest) PT Goals  Acute Rehab PT Goals Pt will go Supine/Side to Sit: with modified independence PT Goal: Supine/Side to Sit - Progress: Progressing toward goal Pt will go Sit to Supine/Side: with modified independence PT Goal: Sit to Supine/Side - Progress: Progressing toward goal Pt will Transfer Bed to Chair/Chair to Bed: with modified independence PT Transfer Goal: Bed to Chair/Chair to Bed - Progress: Progressing toward goal Pt will Ambulate: 16 - 50 feet;with modified independence;with rolling walker PT Goal: Ambulate - Progress: Progressing toward goal Pt will Perform Home Exercise Program: Independently PT Goal: Perform Home Exercise Program - Progress: Other (comment) Pt will Propel Wheelchair: 51 - 150 feet;with modified independence PT Goal: Propel Wheelchair - Progress: Other (comment)  PT Treatment Precautions/Restrictions  Precautions Precautions: Fall Precaution Comments: Contact MRSA Required Braces or Orthoses: No Restrictions Weight Bearing Restrictions: Yes LLE Weight Bearing: Weight bearing as tolerated Mobility (including Balance) Bed Mobility Supine to Sit: 4: Min assist (without physical contact) Supine to Sit Details (indicate cue type and reason): HOB  slightly elevated to approximate home Transfers Sit to Stand: 4: Min assist (without physical contact) Sit to Stand Details (indicate cue type and reason): did not need cues for hand placement Stand to Sit: 4: Min assist;With upper extremity assist;With armrests Stand to Sit Details: cues to control descent Ambulation/Gait Ambulation/Gait Assistance: 4: Min assist (contact guard assist) Ambulation/Gait Assistance Details (indicate cue type and reason): good use of RW, which is pt's own delivered to room, and at good height for patient Ambulation Distance (Feet): 28 Feet Assistive device: Rolling walker Gait Pattern: Decreased step length - right;Decreased step length - left;Decreased stance time - left    Exercise    End of Session PT - End of Session Equipment Utilized During Treatment: Gait belt Activity Tolerance: Patient limited by fatigue Patient left: in chair;with call bell in reach;with family/visitor present Nurse Communication: Mobility status for transfers;Mobility status for ambulation General Behavior During Session: [facility] for tasks performed Cognition: [facility] for tasks performed  Van Clines [facility] [place], [place] [phone]  03/25/2011, 12:53 PM

## 2011-03-25 NOTE — Progress Notes (Signed)
Patient ID: Jeremy [name], male   DOB: March 02, 1951, 60 y.o.   MRN: [id number] Patients left leg ulcer continues to improve, sock changed at bedside, renal functions continue to improve , D/C to home as per Renal,  I'll f/u in 1 week in office

## 2011-03-25 NOTE — Progress Notes (Signed)
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     Action/Plan:   Discharge planning. Patient  will go home with Homehealth thru [facility] in [place].   Anticipated DC Date:  03/26/2011   Anticipated DC Plan:  HOME W HOME HEALTH SERVICES      DC Planning Services  CM consult      Choice offered to / List presented to:  C-1 Patient           HH agency  [facility]   Status of service:  In process, will continue to follow

## 2011-03-25 NOTE — Progress Notes (Signed)
ANTICOAGULATION CONSULT NOTE - Follow Up Consult  Pharmacy Consult for coumadin Indication: History of DVT  No Known Allergies  Patient Measurements: Height: 5\' 11"  (180.3 cm) Weight: 345 lb (156.491 kg) IBW/kg (Calculated) : 75.3   Vital Signs: Temp: 97.9 F (36.6 C) (12/03 0620) BP: 124/60 mmHg (12/03 0620) Pulse Rate: 68  (12/03 0620)  Labs:  Basename 03/25/11 0500 03/24/11 0600 03/23/11 0500  HGB -- -- 9.9*  HCT -- -- 30.6*  PLT -- -- 212  APTT -- -- --  LABPROT 25.4* 26.9* 26.8*  INR 2.27* 2.44* 2.43*  HEPARINUNFRC -- -- --  CREATININE 2.26* 2.50* 2.61*  CKTOTAL -- -- --  CKMB -- -- --  TROPONINI -- -- --   Estimated Creatinine Clearance: 53.7 ml/min (by C-G formula based on Cr of 2.26).   Assessment: 60 YOM on coumadin for history of DVT, INR therapeutic and stable x 5 days, average dose = 3mg /day. No new cbc, no bleeding noted.   Goal of Therapy:  INR 2-3   Plan:  1. Change coumadin to 3mg  daily 2. We will f/u INR on Monday, Wednesday and Friday, and adjust dosage as needed.  Riki Rusk 03/25/2011,2:43 PM

## 2011-03-26 LAB — BASIC METABOLIC PANEL
BUN: 37 mg/dL — ABNORMAL HIGH (ref 6–23)
CO2: 27 mEq/L (ref 19–32)
Calcium: 8.8 mg/dL (ref 8.4–10.5)
Creatinine, Ser: 2.06 mg/dL — ABNORMAL HIGH (ref 0.50–1.35)
GFR calc non Af Amer: 34 mL/min — ABNORMAL LOW (ref >90)
Glucose, Bld: 106 mg/dL — ABNORMAL HIGH (ref 70–99)

## 2011-03-26 LAB — GLUCOSE, CAPILLARY

## 2011-03-26 NOTE — Progress Notes (Signed)
Patient ID: Jeremy [name], male   DOB: 04/08/1951, 60 y.o.   MRN: [id number] Sock  Changes, wound with good granulation tissue D/C held by nephrology, Cr 2.2 yesterday, was not ordered for today, I reordered, not seen by nephrology yesterday, will d/c today if Cr stable

## 2011-03-26 NOTE — Progress Notes (Signed)
CBG @[facility]  68, pt asymptomatic, denies any s/s of hypoglycemia. Pt given a snack and glucose rechecked. CBG 168.  Will continue to monitor pt. Elly Modena

## 2011-03-26 NOTE — Discharge Summary (Signed)
Physician Discharge Summary  Patient ID: Dontrey Snellgrove MRN: [id number] DOB/AGE: 07/05/1950 60 y.o.  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 rate 20, height 5\' 11"  (1.803 m), weight 156.491 kg (345 lb), SpO2 95.00%. Wound much improved with healthy granulation tissue, no drainage, cellulitis resolved  Disposition: home  Discharge Orders    Future Orders Please Complete By Expires   Diet - low sodium heart healthy      Call MD / Call 911      Comments:   If you experience chest pain or shortness of breath, CALL 911 and be transported to the hospital emergency room.  If you develope a fever above 101 F, pus (white drainage) or increased drainage or redness at the wound, or calf pain, call your surgeon's office.   Constipation Prevention      Comments:   Drink plenty of fluids.  Prune juice may be helpful.  You may use a stool softener, such as Colace (over the counter) 100 mg twice a day.  Use MiraLax (over the counter) for constipation as needed.   Increase activity slowly as tolerated      Weight Bearing as taught in Physical Therapy      Comments:   Use a walker or crutches as instructed.   Discharge instructions      Comments:   Change sock left leg daily, keep left leg elevated above heart     Current Discharge Medication List    START taking  these medications   Details  doxycycline (VIBRAMYCIN) 50 MG capsule Take 2 capsules (100 mg total) by mouth 2 (two) times daily. Qty: 60 capsule, Refills: 0    oxyCODONE 7.5 MG TABS Take 7.5 mg by mouth every 6 (six) hours as needed. Qty: 30 tablet, Refills: 0      CONTINUE these medications which have CHANGED   Details  fentaNYL (DURAGESIC - DOSED MCG/HR) 50 MCG/HR Place 1 patch (50 mcg total) onto the skin every 3 (three) days. Qty: 10 patch, Refills: 0      CONTINUE these medications which have NOT CHANGED   Details  albuterol (PROVENTIL) (2.5 MG/3ML) 0.083% nebulizer solution Take 2.5 mg by nebulization every 6 (six) hours as needed. For shortness of breath    ciprofloxacin (CIPRO) 500 MG tablet Take 500 mg by mouth 2 (two) times daily.      clonazePAM (KLONOPIN) 0.5 MG tablet Take 0.5 mg by mouth 2 (two) times daily as needed. For anxiety    ferrous sulfate 325 (65 FE) MG tablet Take 325 mg by mouth 3 (three) times daily.      Fluticasone-Salmeterol (ADVAIR) 500-50 MCG/DOSE AEPB Inhale 1 puff into the lungs every 12 (twelve) hours.      furosemide (LASIX) 40 MG tablet Take 40 mg by mouth 2 (two) times daily.      levalbuterol (XOPENEX HFA) 45 MCG/ACT inhaler Inhale 1-2 puffs into the lungs every 4 (four) hours as needed. For shortness  of breath    lisinopril (PRINIVIL,ZESTRIL) 10 MG tablet Take 10 mg by mouth daily.      montelukast (SINGULAIR) 10 MG tablet Take 10 mg by mouth at bedtime.      oxyCODONE (OXYCONTIN) 15 MG TB12 Take 7.5 mg by mouth every 6 (six) hours as needed. For pain    potassium chloride (KLOR-CON) 10 MEQ CR tablet Take 10 mEq by mouth daily.      pravastatin (PRAVACHOL) 10 MG tablet Take 10 mg by mouth daily.      repaglinide-metformin (PRANDIMET) 1-500 MG tablet Take 1 tablet by mouth daily before supper.      tiotropium (SPIRIVA) 18 MCG inhalation capsule Place 18 mcg into inhaler and inhale daily.      warfarin (COUMADIN) 5 MG tablet Take  5-7.5 mg by mouth daily. Sat and Sun only pt takes 1 tab (5mg ); All other days pt takes 1.5 tabs (7.5mg )      STOP taking these medications     fentaNYL (DURAGESIC - DOSED MCG/HR) 75 MCG/HR      sulfamethoxazole-trimethoprim (BACTRIM DS) 800-160 MG per tablet      traMADol (ULTRAM) 50 MG tablet        Follow-up Information    Follow up with Maranatha Grossi V, MD in 1 week.   Contact information:   [address] [place] [address] [phone]          Signed: Nadara Mustard 03/26/2011, 10:12 AM

## 2011-03-29 NOTE — Progress Notes (Addendum)
Patient ID: Jeremy [name], male   DOB: 1950/11/02, 60 y.o.   MRN: [id number] The surgical procedure included excisional debridement of the fibrinous necrotic tissue of the leg.  Excisional debridment of skin, soft tissue, depth 7mm, circ of leg from tib tubercal to ankle

## 2011-04-01 NOTE — Clinical Documentation Improvement (Signed)
Patient underwent excisional debridement.

## 2011-07-27 ENCOUNTER — Encounter (HOSPITAL_COMMUNITY): Payer: Self-pay | Admitting: Nurse Practitioner

## 2011-07-27 ENCOUNTER — Inpatient Hospital Stay (HOSPITAL_COMMUNITY)
Admission: EM | Admit: 2011-07-27 | Discharge: 2011-07-29 | DRG: 603 | Disposition: A | Discharge status: Home / Self Care | Payer: Medicare Other | Attending: Orthopaedic Surgery | Admitting: Orthopaedic Surgery

## 2011-07-27 DIAGNOSIS — Z7901 Long term (current) use of anticoagulants: Secondary | ICD-10-CM

## 2011-07-27 DIAGNOSIS — R791 Abnormal coagulation profile: Secondary | ICD-10-CM

## 2011-07-27 DIAGNOSIS — K219 Gastro-esophageal reflux disease without esophagitis: Secondary | ICD-10-CM | POA: Diagnosis present

## 2011-07-27 DIAGNOSIS — M79609 Pain in unspecified limb: Secondary | ICD-10-CM

## 2011-07-27 DIAGNOSIS — Z79899 Other long term (current) drug therapy: Secondary | ICD-10-CM

## 2011-07-27 DIAGNOSIS — L0390 Cellulitis, unspecified: Secondary | ICD-10-CM

## 2011-07-27 DIAGNOSIS — L02419 Cutaneous abscess of limb, unspecified: Principal | ICD-10-CM | POA: Diagnosis present

## 2011-07-27 DIAGNOSIS — Z87891 Personal history of nicotine dependence: Secondary | ICD-10-CM

## 2011-07-27 DIAGNOSIS — Z86718 Personal history of other venous thrombosis and embolism: Secondary | ICD-10-CM

## 2011-07-27 DIAGNOSIS — I739 Peripheral vascular disease, unspecified: Secondary | ICD-10-CM | POA: Diagnosis present

## 2011-07-27 DIAGNOSIS — J449 Chronic obstructive pulmonary disease, unspecified: Secondary | ICD-10-CM | POA: Diagnosis present

## 2011-07-27 DIAGNOSIS — Z6841 Body Mass Index (BMI) 40.0 and over, adult: Secondary | ICD-10-CM

## 2011-07-27 DIAGNOSIS — L03116 Cellulitis of left lower limb: Secondary | ICD-10-CM

## 2011-07-27 DIAGNOSIS — J4489 Other specified chronic obstructive pulmonary disease: Secondary | ICD-10-CM | POA: Diagnosis present

## 2011-07-27 DIAGNOSIS — I872 Venous insufficiency (chronic) (peripheral): Secondary | ICD-10-CM | POA: Diagnosis present

## 2011-07-27 DIAGNOSIS — D649 Anemia, unspecified: Secondary | ICD-10-CM | POA: Diagnosis present

## 2011-07-27 DIAGNOSIS — I509 Heart failure, unspecified: Secondary | ICD-10-CM | POA: Diagnosis present

## 2011-07-27 DIAGNOSIS — L97909 Non-pressure chronic ulcer of unspecified part of unspecified lower leg with unspecified severity: Secondary | ICD-10-CM | POA: Diagnosis present

## 2011-07-27 DIAGNOSIS — E119 Type 2 diabetes mellitus without complications: Secondary | ICD-10-CM | POA: Diagnosis present

## 2011-07-27 DIAGNOSIS — E669 Obesity, unspecified: Secondary | ICD-10-CM | POA: Diagnosis present

## 2011-07-27 DIAGNOSIS — G4733 Obstructive sleep apnea (adult) (pediatric): Secondary | ICD-10-CM | POA: Diagnosis present

## 2011-07-27 LAB — CBC
MCV: 93.1 fL (ref 78.0–100.0)
Platelets: 180 10*3/uL (ref 150–400)
RDW: 14.1 % (ref 11.5–15.5)
WBC: 5.3 10*3/uL (ref 4.0–10.5)

## 2011-07-27 LAB — BASIC METABOLIC PANEL
Chloride: 98 mEq/L (ref 96–112)
Creatinine, Ser: 0.87 mg/dL (ref 0.50–1.35)
GFR calc Af Amer: >90 mL/min (ref >90)
GFR calc non Af Amer: >90 mL/min (ref >90)

## 2011-07-27 LAB — PROTIME-INR: Prothrombin Time: 36.5 seconds — ABNORMAL HIGH (ref 11.6–15.2)

## 2011-07-27 MED ORDER — LEVALBUTEROL TARTRATE 45 MCG/ACT IN AERO
1.0000 | INHALATION_SPRAY | RESPIRATORY_TRACT | Status: DC | PRN
  Filled 2011-07-27: qty 15

## 2011-07-27 MED ORDER — CLONAZEPAM 0.5 MG PO TABS: 0.5000 mg | ORAL_TABLET | Freq: Two times a day (BID) | ORAL | Status: DC | PRN

## 2011-07-27 MED ORDER — OXYCODONE HCL 15 MG PO TB12: 7.5000 mg | ORAL_TABLET | Freq: Two times a day (BID) | ORAL | Status: DC

## 2011-07-27 MED ORDER — POTASSIUM CHLORIDE CRYS ER 10 MEQ PO TBCR
10.0000 meq | EXTENDED_RELEASE_TABLET | Freq: Every day | ORAL | Status: DC
  Administered 2011-07-28 – 2011-07-29 (×2): 10 meq via ORAL
  Filled 2011-07-27 (×2): qty 1

## 2011-07-27 MED ORDER — FENTANYL 50 MCG/HR TD PT72
50.0000 ug | MEDICATED_PATCH | TRANSDERMAL | Status: DC
  Administered 2011-07-27: 50 ug via TRANSDERMAL
  Filled 2011-07-27: qty 1

## 2011-07-27 MED ORDER — ALBUTEROL SULFATE (5 MG/ML) 0.5% IN NEBU
2.5000 mg | INHALATION_SOLUTION | Freq: Four times a day (QID) | RESPIRATORY_TRACT | Status: DC
  Administered 2011-07-28 (×2): 2.5 mg via RESPIRATORY_TRACT
  Filled 2011-07-27 (×3): qty 0.5

## 2011-07-27 MED ORDER — VANCOMYCIN HCL IN DEXTROSE 1-5 GM/200ML-% IV SOLN
1000.0000 mg | Freq: Three times a day (TID) | INTRAVENOUS | Status: DC
  Administered 2011-07-28 – 2011-07-29 (×4): 1000 mg via INTRAVENOUS
  Filled 2011-07-27 (×6): qty 200

## 2011-07-27 MED ORDER — MONTELUKAST SODIUM 10 MG PO TABS
10.0000 mg | ORAL_TABLET | Freq: Every day | ORAL | Status: DC
  Administered 2011-07-29: 10 mg via ORAL
  Filled 2011-07-27: qty 1

## 2011-07-27 MED ORDER — FLUTICASONE-SALMETEROL 500-50 MCG/DOSE IN AEPB
1.0000 | INHALATION_SPRAY | Freq: Two times a day (BID) | RESPIRATORY_TRACT | Status: DC
  Administered 2011-07-28 – 2011-07-29 (×3): 1 via RESPIRATORY_TRACT
  Filled 2011-07-27: qty 14

## 2011-07-27 MED ORDER — FERROUS SULFATE 325 (65 FE) MG PO TABS
325.0000 mg | ORAL_TABLET | Freq: Three times a day (TID) | ORAL | Status: DC
  Administered 2011-07-28 – 2011-07-29 (×4): 325 mg via ORAL
  Filled 2011-07-27 (×6): qty 1

## 2011-07-27 MED ORDER — REPAGLINIDE-METFORMIN HCL 1-500 MG PO TABS: 1.0000 | ORAL_TABLET | Freq: Every day | ORAL | Status: DC

## 2011-07-27 MED ORDER — TIOTROPIUM BROMIDE MONOHYDRATE 18 MCG IN CAPS
18.0000 ug | ORAL_CAPSULE | Freq: Every day | RESPIRATORY_TRACT | Status: DC
  Administered 2011-07-28 – 2011-07-29 (×2): 18 ug via RESPIRATORY_TRACT
  Filled 2011-07-27: qty 5

## 2011-07-27 MED ORDER — REPAGLINIDE 1 MG PO TABS
1.0000 mg | ORAL_TABLET | Freq: Every day | ORAL | Status: DC
  Administered 2011-07-28: 1 mg via ORAL
  Filled 2011-07-27 (×3): qty 1

## 2011-07-27 MED ORDER — WARFARIN SODIUM 5 MG PO TABS: 5.0000 mg | ORAL_TABLET | Freq: Every day | ORAL | Status: DC

## 2011-07-27 MED ORDER — OXYCODONE HCL 5 MG PO TABS
10.0000 mg | ORAL_TABLET | ORAL | Status: DC | PRN
  Administered 2011-07-28 (×4): 10 mg via ORAL
  Filled 2011-07-27 (×5): qty 2

## 2011-07-27 MED ORDER — SIMVASTATIN 5 MG PO TABS
5.0000 mg | ORAL_TABLET | Freq: Every day | ORAL | Status: DC
  Administered 2011-07-28: 5 mg via ORAL
  Filled 2011-07-27 (×2): qty 1

## 2011-07-27 MED ORDER — OXYCODONE-ACETAMINOPHEN 5-325 MG PO TABS
2.0000 | ORAL_TABLET | Freq: Once | ORAL | Status: AC
  Administered 2011-07-27: 2 via ORAL
  Filled 2011-07-27: qty 2

## 2011-07-27 MED ORDER — PIPERACILLIN-TAZOBACTAM 3.375 G IVPB
3.3750 g | Freq: Three times a day (TID) | INTRAVENOUS | Status: DC
  Administered 2011-07-28 – 2011-07-29 (×5): 3.375 g via INTRAVENOUS
  Filled 2011-07-27 (×7): qty 50

## 2011-07-27 MED ORDER — LISINOPRIL 10 MG PO TABS: 10.0000 mg | ORAL_TABLET | Freq: Every day | ORAL | Status: DC

## 2011-07-27 MED ORDER — METFORMIN HCL 500 MG PO TABS
500.0000 mg | ORAL_TABLET | Freq: Every day | ORAL | Status: DC
  Administered 2011-07-28: 500 mg via ORAL
  Filled 2011-07-27 (×3): qty 1

## 2011-07-27 MED ORDER — FUROSEMIDE 40 MG PO TABS
40.0000 mg | ORAL_TABLET | Freq: Two times a day (BID) | ORAL | Status: DC
  Administered 2011-07-28 (×2): 40 mg via ORAL
  Filled 2011-07-27 (×4): qty 1

## 2011-07-27 MED ORDER — VANCOMYCIN HCL 1000 MG IV SOLR
2000.0000 mg | Freq: Once | INTRAVENOUS | Status: AC
  Administered 2011-07-28: 2000 mg via INTRAVENOUS
  Filled 2011-07-27: qty 2000

## 2011-07-27 NOTE — ED Provider Notes (Addendum)
Complains of increased pain drainage and redness to right lower extremity since yesterday. Followed by Dr.Duda and the wound clinic since November 2012.. on exam nontoxic alert left lower extremity with approximately 10 cm open wound with surrounding redness DP pulse 2+; yellow drainage on bandage  Doug Sou, MD 07/27/11 2025  Spoke with Dr. Magnus Ivan who will come to evaluate the patient for admission. He requests internal medicine consultation which we will call  Doug Sou, MD 07/27/11 2057

## 2011-07-27 NOTE — ED Notes (Signed)
Pt being treated by dr duda for a wound to LLE since November. Woke this am and swelling/redness/drainage increased so they are worried about infection or a DVT. Currently taking oral abx.

## 2011-07-27 NOTE — Progress Notes (Signed)
VASCULAR LAB PRELIMINARY  PRELIMINARY  PRELIMINARY  PRELIMINARY  Left lower extremity venous Doppler completed.    Preliminary report:  There is no DVT or SVT noted in the left lower extremity.  Sherren Kerns [place], 07/27/2011, 6:21 PM

## 2011-07-27 NOTE — H&P (Signed)
Jeremy [name] is an 60 y.o. male.   Chief Complaint:   Left leg pain with redness around chronic wound HPI:   61 yo male patient of Dr. 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  Has been on oral antibiotcs and treated with silver-based socks recently.  Developed worsening left leg pain with redness and increased drainage over the past 24 hours.  Also with mild fever and nausea.  Past Medical History  Diagnosis Date  . Diabetes mellitus   . COPD (chronic obstructive pulmonary disease)   . GERD (gastroesophageal reflux disease)   . Asthma   . Shortness of breath   . Leg pain   . CHF (congestive heart failure)   . Peripheral vascular disease   . Sleep apnea     uses bipap, sleep study done in [place] 2 years ago, dr Cala Bradford byrd  . Anemia   . Headache     sinus headaches  . Cancer     benign skin cancer    Past Surgical History  Procedure Date  . Arm surgery   . Vein ligation and stripping   . Tummy tuck   . Stomach stapling   . Skin grafts   . I&d extremity 03/13/2011    Procedure: IRRIGATION AND DEBRIDEMENT EXTREMITY;  Surgeon: Nadara Mustard, MD;  Location: [facility] OR;  Service: Orthopedics;  Laterality: Left;    Family History  Problem Relation Age of Onset  . Heart disease Brother    Social History:  reports that he quit smoking about 20 years ago. His smoking use included Cigarettes. He has a 3 pack-year smoking history. He does not have any smokeless tobacco history on file. He reports that he drinks alcohol. He reports that he does not use illicit drugs.  Allergies: No Known Allergies  Medications Prior to Admission  Medication Dose Route Frequency Provider Last Rate Last Dose  . oxyCODONE-acetaminophen (PERCOCET) 5-325 MG per tablet 2 tablet  2 tablet Oral Once Shaaron Adler, PA-C   2 tablet at 07/27/11 2020   Medications Prior to Admission  Medication Sig Dispense Refill  . albuterol (PROVENTIL) (2.5 MG/3ML) 0.083% nebulizer  solution Take 2.5 mg by nebulization every 6 (six) hours as needed. For shortness of breath      . ciprofloxacin (CIPRO) 500 MG tablet Take 500 mg by mouth 2 (two) times daily.        . clonazePAM (KLONOPIN) 0.5 MG tablet Take 0.5 mg by mouth 2 (two) times daily as needed. For anxiety      . ferrous sulfate 325 (65 FE) MG tablet Take 325 mg by mouth 3 (three) times daily.        . Fluticasone-Salmeterol (ADVAIR) 500-50 MCG/DOSE AEPB Inhale 1 puff into the lungs every 12 (twelve) hours.        . furosemide (LASIX) 40 MG tablet Take 40 mg by mouth 2 (two) times daily.        [name] levalbuterol (XOPENEX HFA) 45 MCG/ACT inhaler Inhale 1-2 puffs into the lungs every 4 (four) hours as needed. For shortness of breath      . lisinopril (PRINIVIL,ZESTRIL) 10 MG tablet Take 10 mg by mouth daily.        . montelukast (SINGULAIR) 10 MG tablet Take 10 mg by mouth at bedtime.        [name] oxyCODONE (OXYCONTIN) 15 MG TB12 Take 7.5 mg by mouth every 6 (six) hours as needed. For pain      .  potassium chloride (KLOR-CON) 10 MEQ CR tablet Take 10 mEq by mouth daily.        . pravastatin (PRAVACHOL) 10 MG tablet Take 10 mg by mouth daily.        . repaglinide-metformin (PRANDIMET) 1-500 MG tablet Take 1 tablet by mouth daily before supper.        . tiotropium (SPIRIVA) 18 MCG inhalation capsule Place 18 mcg into inhaler and inhale daily.        [name] warfarin (COUMADIN) 5 MG tablet Take 5-7.5 mg by mouth daily. Sat and Sun only pt takes 1 tab (5mg ); All other days pt takes 1.5 tabs (7.5mg )        Results for orders placed during the hospital encounter of 07/27/11 (from the past 48 hour(s))  CBC     Status: Abnormal   Collection Time   07/27/11  5:01 PM      Component Value Range Comment   WBC 5.3  4.0 - 10.5 (K/uL)    RBC 3.46 (*) 4.22 - 5.81 (MIL/uL)    Hemoglobin 10.3 (*) 13.0 - 17.0 (g/dL)    HCT 16.1 (*) 09.6 - 52.0 (%)    MCV 93.1  78.0 - 100.0 (fL)    MCH 29.8  26.0 - 34.0 (pg)    MCHC 32.0  30.0 - 36.0 (g/dL)     RDW 04.5  40.9 - 81.1 (%)    Platelets 180  150 - 400 (K/uL)   BASIC METABOLIC PANEL     Status: Normal   Collection Time   07/27/11  5:01 PM      Component Value Range Comment   Sodium 139  135 - 145 (mEq/L)    Potassium 4.1  3.5 - 5.1 (mEq/L)    Chloride 98  96 - 112 (mEq/L)    CO2 32  19 - 32 (mEq/L)    Glucose, Bld 98  70 - 99 (mg/dL)    BUN 13  6 - 23 (mg/dL)    Creatinine, Ser 9.14  0.50 - 1.35 (mg/dL)    Calcium 9.0  8.4 - 10.5 (mg/dL)    GFR calc non Af Amer >90  >90 (mL/min)    GFR calc Af Amer >90  >90 (mL/min)   PROTIME-INR     Status: Abnormal   Collection Time   07/27/11  5:01 PM      Component Value Range Comment   Prothrombin Time 36.5 (*) 11.6 - 15.2 (seconds)    INR 3.61 (*) 0.00 - 1.49     No results found.  Review of Systems  Constitutional: Positive for fever and malaise/fatigue.  Gastrointestinal: Positive for nausea.    Blood pressure 119/48, pulse 73, temperature 98.4 F (36.9 C), temperature source Oral, resp. rate 16, height 5\' 11"  (1.803 m), weight 144.244 kg (318 lb), SpO2 98.00%. Physical Exam  Constitutional: He is oriented to person, place, and time. He appears well-developed and well-nourished.  HENT:  Head: Normocephalic and atraumatic.  Eyes: EOM are normal. Pupils are equal, round, and reactive to light.  Neck: Normal range of motion. Neck supple.  Cardiovascular: Normal rate and regular rhythm.   Respiratory: Effort normal and breath sounds normal.  GI: Soft. Bowel sounds are normal.  Musculoskeletal:       Left lower leg: He exhibits swelling.       Legs: Neurological: He is alert and oriented to person, place, and time.     Assessment/Plan Left leg cellulitis, chronic venous insufficiency, chronic wound 1) admit  for IV antibiotics 2) continue silver/silvadene sock.  Kathryne Hitch 07/27/2011, 9:24 PM

## 2011-07-27 NOTE — Progress Notes (Addendum)
ANTIBIOTIC CONSULT NOTE - INITIAL  Pharmacy Consult for Vancomycin and Zosyn  Indication: cellulitis  No Known Allergies  Patient Measurements: Height: 5\' 11"  (180.3 cm) Weight: 318 lb (144.244 kg) IBW/kg (Calculated) : 75.3   Vital Signs: Temp: 99.5 F (37.5 C) (04/06 2230) Temp src: Oral (04/06 1829) BP: 126/55 mmHg (04/06 2230) Pulse Rate: 68  (04/06 2230)  Labs:  Basename 07/27/11 1701  WBC 5.3  HGB 10.3*  PLT 180  LABCREA --  CREATININE 0.87   Estimated Creatinine Clearance: 131.4 ml/min (by C-G formula based on Cr of 0.87).  Medical History: Past Medical History  Diagnosis Date  . Diabetes mellitus   . COPD (chronic obstructive pulmonary disease)   . GERD (gastroesophageal reflux disease)   . Asthma   . Shortness of breath   . Leg pain   . CHF (congestive heart failure)   . Peripheral vascular disease   . Sleep apnea     uses bipap, sleep study done in [place] 2 years ago, dr Cala Bradford byrd  . Anemia   . Headache     sinus headaches  . Cancer     benign skin cancer   Medications:  Prescriptions prior to admission  Medication Sig Dispense Refill  . albuterol (PROVENTIL) (2.5 MG/3ML) 0.083% nebulizer solution Take 2.5 mg by nebulization every 6 (six) hours as needed. For shortness of breath      . ciprofloxacin (CIPRO) 500 MG tablet Take 500 mg by mouth 2 (two) times daily.        . clonazePAM (KLONOPIN) 0.5 MG tablet Take 0.5 mg by mouth 2 (two) times daily as needed. For anxiety      . ferrous sulfate 325 (65 FE) MG tablet Take 325 mg by mouth 3 (three) times daily.        . Fluticasone-Salmeterol (ADVAIR) 500-50 MCG/DOSE AEPB Inhale 1 puff into the lungs every 12 (twelve) hours.        . furosemide (LASIX) 40 MG tablet Take 40 mg by mouth 2 (two) times daily.        [name] levalbuterol (XOPENEX HFA) 45 MCG/ACT inhaler Inhale 1-2 puffs into the lungs every 4 (four) hours as needed. For shortness of breath      . lisinopril (PRINIVIL,ZESTRIL) 10 MG tablet  Take 10 mg by mouth daily.        . montelukast (SINGULAIR) 10 MG tablet Take 10 mg by mouth at bedtime.        [name] oxyCODONE (OXYCONTIN) 15 MG TB12 Take 7.5 mg by mouth every 6 (six) hours as needed. For pain      . potassium chloride (KLOR-CON) 10 MEQ CR tablet Take 10 mEq by mouth daily.        . pravastatin (PRAVACHOL) 10 MG tablet Take 10 mg by mouth daily.        . repaglinide-metformin (PRANDIMET) 1-500 MG tablet Take 1 tablet by mouth daily before supper.        . tiotropium (SPIRIVA) 18 MCG inhalation capsule Place 18 mcg into inhaler and inhale daily.        [name] warfarin (COUMADIN) 5 MG tablet Take 5-7.5 mg by mouth daily. Sat and Sun only pt takes 1 tab (5mg ); All other days pt takes 1.5 tabs (7.5mg )       Assessment: 61 yo male with left leg cellulitis for empiric antibiotics.  Upon reviewing previous admit 02/2011 patient experienced acute renal failure and elevated vancomycin levels.  Will closely follow renal function.  Goal of Therapy:  Vancomycin trough level 10-15 mcg/ml  Plan:  Vancomycin 2 g IV now, then 1 g IV q8h Vancomycin trough prior to 4th dose. Zosyn 3.375 g IV q8h   Eddie Candle 07/27/2011,11:25 PM

## 2011-07-27 NOTE — ED Notes (Signed)
Pt took 1 percocet from home dose, okay per dr hosmer

## 2011-07-27 NOTE — ED Notes (Addendum)
MD at bedside. 

## 2011-07-27 NOTE — ED Provider Notes (Signed)
History     CSN: [id number]  Arrival date & time 07/27/11  1354   First MD Initiated Contact with Patient 07/27/11 1647      Chief Complaint  Patient presents with  . Leg Pain    (Consider location/radiation/quality/duration/timing/severity/associated sxs/prior treatment) HPI 61 y/o m with PMH of DM, PVD, and chronic LLE ulcer s/p debridement in 02/2011 and currently followed by Dr Lajoyce Corners presents to the ED with c/c of acutely worse swelling, erythema, pain, and purulent drainage with associated foul odor to his left leg ulcer and the surrounding area since yesterday. Has been using prescription socks as directed by Dr Lajoyce Corners. Has also been taking PO Bactrim. Denies any new injury to the area. Denies any associated fever, chills, new weakness or numbness. He does have a history of DVT and is currently taking coumadin but has not had an INR check in some time. No recent prolonged travel, though the patient has been essentially non-ambulatory recently per Dr Audrie Lia recommendation in an effort to promote wound healing.  Past Medical History  Diagnosis Date  . Diabetes mellitus   . COPD (chronic obstructive pulmonary disease)   . GERD (gastroesophageal reflux disease)   . Asthma   . Shortness of breath   . Leg pain   . CHF (congestive heart failure)   . Peripheral vascular disease   . Sleep apnea     uses bipap, sleep study done in [place] 2 years ago, dr Cala Bradford byrd  . Anemia   . Headache     sinus headaches  . Cancer     benign skin cancer    Past Surgical History  Procedure Date  . Arm surgery   . Vein ligation and stripping   . Tummy tuck   . Stomach stapling   . Skin grafts   . I&d extremity 03/13/2011    Procedure: IRRIGATION AND DEBRIDEMENT EXTREMITY;  Surgeon: Nadara Mustard, MD;  Location: [facility] OR;  Service: Orthopedics;  Laterality: Left;    Family History  Problem Relation Age of Onset  . Heart disease Brother     History  Substance Use Topics  . Smoking  status: Former Smoker -- 0.5 packs/day for 6 years    Types: Cigarettes    Quit date: 12/03/1990  . Smokeless tobacco: Not on file  . Alcohol Use: 0.0 oz/week    0 Cans of beer per week     rare      Review of Systems 10 systems reviewed and are negative for acute change except as noted in the HPI.  Allergies  Review of patient's allergies indicates no known allergies.  Home Medications   Current Outpatient Rx  Name Route Sig Dispense Refill  . ALBUTEROL SULFATE (2.5 MG/3ML) 0.083% IN NEBU Nebulization Take 2.5 mg by nebulization every 6 (six) hours as needed. For shortness of breath    . CIPROFLOXACIN HCL 500 MG PO TABS Oral Take 500 mg by mouth 2 (two) times daily.      [name] CLONAZEPAM 0.5 MG PO TABS Oral Take 0.5 mg by mouth 2 (two) times daily as needed. For anxiety    . FERROUS SULFATE 325 (65 FE) MG PO TABS Oral Take 325 mg by mouth 3 (three) times daily.      [name] FLUTICASONE-SALMETEROL 500-50 MCG/DOSE IN AEPB Inhalation Inhale 1 puff into the lungs every 12 (twelve) hours.      . FUROSEMIDE 40 MG PO TABS Oral Take 40 mg by mouth 2 (two) times daily.      [name]  LEVALBUTEROL TARTRATE 45 MCG/ACT IN AERO Inhalation Inhale 1-2 puffs into the lungs every 4 (four) hours as needed. For shortness of breath    . LISINOPRIL 10 MG PO TABS Oral Take 10 mg by mouth daily.      [name] MONTELUKAST SODIUM 10 MG PO TABS Oral Take 10 mg by mouth at bedtime.      . OXYCODONE HCL ER 15 MG PO TB12 Oral Take 7.5 mg by mouth every 6 (six) hours as needed. For pain    . POTASSIUM CHLORIDE 10 MEQ PO TBCR Oral Take 10 mEq by mouth daily.      [name] PRAVASTATIN SODIUM 10 MG PO TABS Oral Take 10 mg by mouth daily.      [name] REPAGLINIDE-METFORMIN HCL 1-500 MG PO TABS Oral Take 1 tablet by mouth daily before supper.      [name] TIOTROPIUM BROMIDE MONOHYDRATE 18 MCG IN CAPS Inhalation Place 18 mcg into inhaler and inhale daily.      . WARFARIN SODIUM 5 MG PO TABS Oral Take 5-7.5 mg by mouth daily. Sat and Sun only pt takes 1 tab  (5mg ); All other days pt takes 1.5 tabs (7.5mg )      BP 150/59  Pulse 75  Temp(Src) 98 F (36.7 C) (Oral)  Resp 18  Ht 5\' 11"  (1.803 m)  Wt 318 lb (144.244 kg)  BMI 44.35 kg/m2  SpO2 99%  Physical Exam  Nursing note and vitals reviewed. Constitutional: He is oriented to person, place, and time. He appears well-developed and well-nourished. No distress.       Nontoxic appearing. Vital signs are normal.  HENT:  Head: Normocephalic and atraumatic.  Right Ear: External ear normal.  Left Ear: External ear normal.  Eyes: Pupils are equal, round, and reactive to light.  Neck: Normal range of motion. Neck supple.  Cardiovascular: Normal rate and regular rhythm.   Pulses:      Dorsalis pedis pulses are 2+ on the right side, and 1+ on the left side.  Pulmonary/Chest: Effort normal and breath sounds normal. No respiratory distress. He has no wheezes. He exhibits no tenderness.  Abdominal: Soft. Bowel sounds are normal. He exhibits no distension. There is no tenderness.  Musculoskeletal: Normal range of motion.       See skin exam. Edema to LLE from approx 2 inches above the knee distally to the toes. No tenderness to palpation of any area aside from chronic wound.  Neurological: He is alert and oriented to person, place, and time.  Skin: He is not diaphoretic.       Chronic wound to anterior and lateral aspect of distal LLE. Approx 4cm height x 11cm width. Surrounding erythema. Purulent drainage seen on bandage, none actively from wound. Chronic skin changes to the left foot, unchanged per pt and wife.  Psychiatric: He has a normal mood and affect.    ED Course  Procedures (including critical care time)  Labs Reviewed  CBC - Abnormal; Notable for the following:    RBC 3.46 (*)    Hemoglobin 10.3 (*)    HCT 32.2 (*)    All other components within normal limits  PROTIME-INR - Abnormal; Notable for the following:    Prothrombin Time 36.5 (*)    INR 3.61 (*)    All other components  within normal limits  BASIC METABOLIC PANEL  WOUND CULTURE  CBC  SEDIMENTATION RATE  C-REACTIVE PROTEIN  BASIC METABOLIC PANEL   No results found.   1. Cellulitis  2. Supratherapeutic INR       MDM  Chronic wound with apparent new/worsening infection. Non-toxic patient, does not appear septic, afebrile, no tachycardia or hypotension. No leukocytosis. Distal pulse palpable. A doppler of the affected extremity is negative for any DVT. Wound culture sent. Orthopedics has been consulted (Dr Magnus Ivan) and will admit the patient. Spoke with medicine team (Dr Kaylyn Layer) who will consult for DM and coumadin management.        Shaaron Adler, [place] 07/28/11 [phone]

## 2011-07-27 NOTE — ED Notes (Signed)
Vascular tech at bedside preforming doppler to left lower extremity

## 2011-07-28 LAB — SEDIMENTATION RATE: Sed Rate: 70 mm/hr — ABNORMAL HIGH (ref 0–16)

## 2011-07-28 LAB — GLUCOSE, CAPILLARY
Glucose-Capillary: 108 mg/dL — ABNORMAL HIGH (ref 70–99)
Glucose-Capillary: 134 mg/dL — ABNORMAL HIGH (ref 70–99)
Glucose-Capillary: 140 mg/dL — ABNORMAL HIGH (ref 70–99)

## 2011-07-28 LAB — BASIC METABOLIC PANEL
Chloride: 100 mEq/L (ref 96–112)
GFR calc Af Amer: >90 mL/min (ref >90)
GFR calc non Af Amer: >90 mL/min (ref >90)
Potassium: 4.1 mEq/L (ref 3.5–5.1)
Sodium: 139 mEq/L (ref 135–145)

## 2011-07-28 LAB — CBC
Hemoglobin: 9.7 g/dL — ABNORMAL LOW (ref 13.0–17.0)
MCH: 29 pg (ref 26.0–34.0)
RBC: 3.35 MIL/uL — ABNORMAL LOW (ref 4.22–5.81)
WBC: 4.8 10*3/uL (ref 4.0–10.5)

## 2011-07-28 LAB — PROTIME-INR: Prothrombin Time: 38.3 seconds — ABNORMAL HIGH (ref 11.6–15.2)

## 2011-07-28 LAB — C-REACTIVE PROTEIN: CRP: 2.48 mg/dL — ABNORMAL HIGH (ref <0.60)

## 2011-07-28 LAB — MRSA PCR SCREENING: MRSA by PCR: POSITIVE — AB

## 2011-07-28 MED ORDER — MUPIROCIN 2 % EX OINT
1.0000 "application " | TOPICAL_OINTMENT | Freq: Two times a day (BID) | CUTANEOUS | Status: DC
  Administered 2011-07-28 – 2011-07-29 (×2): 1 via NASAL
  Filled 2011-07-28: qty 22

## 2011-07-28 MED ORDER — WARFARIN - PHARMACIST DOSING INPATIENT: Freq: Every day | Status: DC

## 2011-07-28 MED ORDER — INSULIN ASPART 100 UNIT/ML ~~LOC~~ SOLN
0.0000 [IU] | Freq: Three times a day (TID) | SUBCUTANEOUS | Status: DC
  Administered 2011-07-28 – 2011-07-29 (×3): 1 [IU] via SUBCUTANEOUS

## 2011-07-28 MED ORDER — ALBUTEROL SULFATE (5 MG/ML) 0.5% IN NEBU
2.5000 mg | INHALATION_SOLUTION | Freq: Three times a day (TID) | RESPIRATORY_TRACT | Status: DC
  Administered 2011-07-28 – 2011-07-29 (×2): 2.5 mg via RESPIRATORY_TRACT
  Filled 2011-07-28: qty 0.5

## 2011-07-28 MED ORDER — CHLORHEXIDINE GLUCONATE CLOTH 2 % EX PADS: 6.0000 | MEDICATED_PAD | Freq: Every day | CUTANEOUS | Status: DC

## 2011-07-28 NOTE — ED Provider Notes (Signed)
Medical screening examination/treatment/procedure(s) were conducted as a shared visit with non-physician practitioner(s) and myself.  I personally evaluated the patient during the encounter  Doug Sou, MD 07/28/11 0147

## 2011-07-28 NOTE — Progress Notes (Signed)
Patient ID: Jeremy [name], male   DOB: 29-Aug-1950, 61 y.o.   MRN: [id number] No acute changes.  Feels a little better overall.  WBC only 4,000.  ESR 70.  Left leg with serous drainage from chronic wound but no gross purulence.  Cellulitis improving.  Plan: Continue IV abx for another 24 hours. Will have Dr. Lajoyce Corners re-assess in am.

## 2011-07-28 NOTE — Progress Notes (Signed)
ANTICOAGULATION CONSULT NOTE - Follow Up Consult  Pharmacy Consult:  Coumadin Indication:  H/o DVT  No Known Allergies  Patient Measurements: Height: 5\' 11"  (180.3 cm) Weight: 318 lb (144.244 kg) IBW/kg (Calculated) : 75.3   Vital Signs: Temp: 98.7 F (37.1 C) (04/07 0702) BP: 122/64 mmHg (04/07 0702) Pulse Rate: 66  (04/07 0702)  Labs:  Basename 07/28/11 0444 07/27/11 1701  HGB 9.7* 10.3*  HCT 31.2* 32.2*  PLT 175 180  APTT -- --  LABPROT 38.3* 36.5*  INR 3.84* 3.61*  HEPARINUNFRC -- --  CREATININE 0.91 0.87  CKTOTAL -- --  CKMB -- --  TROPONINI -- --   Estimated Creatinine Clearance: 125.6 ml/min (by C-G formula based on Cr of 0.91).    Assessment: 61 YOF admitted with left leg pain and redness.  Pharmacy consulted to manage Coumadin for h/o DVT, and vanc/Zosyn empirically for cellulitis.  INR remains supratherapeutic since admit and has trended up, no bleeding reported.  Patient's renal fxn remains stable.  Home Coumadin dose:  7.5mg  PO daily except 5mg  Sat / Sun  4/6 left leg wound cx - pending   Goal of Therapy:  INR 2 - 3  Vanc trough 10 - 15 mcg/mL    Plan:  - Continue vanc 1gm IV Q8H - Continue Zosyn 3.375gm IV Q8H (4 hr infusion) - Monitor renal fxn, vanc trough tomorrow as already ordered - Continue to hold Coumadin today - Daily PT/INR - MD: update med rec now available    Arval Brandstetter D. Laney Potash, PharmD, BCPS Pager:  [phone] 07/28/2011, 10:33 AM

## 2011-07-28 NOTE — Progress Notes (Signed)
I have seen and examined patient admitted to the orthopedic service today with wound left leg chronic wound with cellulitis and has a history of diabetes, hypertension, COPD, and acute renal insufficiency in the past and medicine consulted for medical management. Patient denies any complaints at this time, will continue current management as per Dr. Kaylyn Layer this am, and make further recommendations pending his clinical course.

## 2011-07-28 NOTE — H&P (Signed)
Reason for consultation: Co-management of medical issues Requesting consultation: Dr. 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 [facility] is asked to admit for medicine co-management.   Pt was last admitted by Dr. Lajoyce Corners in 02/2011 for his chronic LLE ulcer and was  given IV ABx. He had renal insufficiency during hospitalization, renal was  consulted, and this resolved by discharge. Renal insufficiency thought due to low  BP, ACEi, and high vanc levels.   He now comes back with increased weeping and purulent drainage of his LLE, but  denies fevers, chills, sweats, or feeling systemically ill. Denies any  cardiopulmonary symptoms. Has had some chronic nausea/vomiting which his PCP has  evaluated and planned to send for ? gastric emptying study. ROS otherwise  unremarkable.   In the ED vitals were stable. Labs with normal chem, CBC with stable Hgb. INR 3.6.  Wound cultures pending.    Past Medical History  Diagnosis Date  . Diabetes mellitus   . COPD (chronic obstructive pulmonary disease)   . GERD (gastroesophageal reflux disease)   . Asthma   . Shortness of breath   . Leg pain   . CHF (congestive heart failure)   . Peripheral vascular disease   . Sleep apnea     uses bipap, sleep study done in [place] 2 years ago, dr Cala Bradford byrd  . Anemia   . Headache     sinus headaches  . Cancer     benign skin cancer    Past Surgical History  Procedure Date  . Arm surgery   . Vein ligation and stripping   . Tummy tuck   . Stomach stapling   . Skin grafts   . I&d extremity 03/13/2011    Procedure: IRRIGATION AND DEBRIDEMENT EXTREMITY;  Surgeon: Nadara Mustard, MD;  Location: [facility] OR;  Service: Orthopedics;  Laterality: Left;    Medications:  HOME MEDS: Prior to Admission medications   Medication Sig Start Date End Date  Taking? Authorizing Provider  albuterol (PROVENTIL) (2.5 MG/3ML) 0.083% nebulizer solution Take 2.5 mg by nebulization every 6 (six) hours as needed. For shortness of breath   Yes Historical Provider, MD  ciprofloxacin (CIPRO) 500 MG tablet Take 500 mg by mouth 2 (two) times daily.     Yes Historical Provider, MD  clonazePAM (KLONOPIN) 0.5 MG tablet Take 0.5 mg by mouth 2 (two) times daily as needed. For anxiety   Yes Historical Provider, MD  ferrous sulfate 325 (65 FE) MG tablet Take 325 mg by mouth 3 (three) times daily.     Yes Historical Provider, MD  Fluticasone-Salmeterol (ADVAIR) 500-50 MCG/DOSE AEPB Inhale 1 puff into the lungs every 12 (twelve) hours.     Yes Historical Provider, MD  furosemide (LASIX) 40 MG tablet Take 40 mg by mouth 2 (two) times daily.     Yes Historical Provider, MD  levalbuterol [facility] HFA) 45 MCG/ACT inhaler Inhale 1-2 puffs into the lungs every 4 (four) hours as needed. For shortness of breath   Yes Historical Provider, MD  lisinopril (PRINIVIL,ZESTRIL) 10 MG tablet Take 10 mg by mouth daily.     Yes Historical Provider, MD  montelukast (SINGULAIR) 10 MG tablet Take 10 mg by mouth at bedtime.     Yes Historical Provider, MD  oxyCODONE (OXYCONTIN) 15 MG TB12 Take 7.5 mg by mouth  every 6 (six) hours as needed. For pain   Yes Historical Provider, MD  potassium chloride (KLOR-CON) 10 MEQ CR tablet Take 10 mEq by mouth daily.     Yes Historical Provider, MD  pravastatin (PRAVACHOL) 10 MG tablet Take 10 mg by mouth daily.     Yes Historical Provider, MD  repaglinide-metformin (PRANDIMET) 1-500 MG tablet Take 1 tablet by mouth daily before supper.     Yes Historical Provider, MD  tiotropium (SPIRIVA) 18 MCG inhalation capsule Place 18 mcg into inhaler and inhale daily.     Yes Historical Provider, MD  warfarin (COUMADIN) 5 MG tablet Take 5-7.5 mg by mouth daily. Sat and Sun only pt takes 1 tab (5mg ); All other days pt takes 1.5 tabs (7.5mg )   Yes Historical Provider, MD     Allergies:  No Known Allergies  Social History:   reports that he quit smoking about 20 years ago. His smoking use included Cigarettes. He has a 3 pack-year smoking history. He does not have any smokeless tobacco history on file. He reports that he drinks alcohol. He reports that he does not use illicit drugs.  Family History: Family History  Problem Relation Age of Onset  . Heart disease Brother     Physical Exam: Filed Vitals:   07/27/11 1829 07/27/11 2000 07/27/11 2015 07/27/11 2230  BP: 121/65 119/48  126/55  Pulse: 60 66 73 68  Temp: 98.4 F (36.9 C)   99.5 F (37.5 C)  TempSrc: Oral     Resp: 16   18  Height:      Weight:      SpO2: 99% 99% 98% 99%   Blood pressure 126/55, pulse 68, temperature 99.5 F (37.5 C), temperature source Oral, resp. rate 18, height 5\' 11"  (1.803 m), weight 144.244 kg (318 lb), SpO2 99.00%. Gen: Very obese M in no distress, able to relate history well, pleasant and nice,  breathing comfortably, doesn't appear toxic or even ill.  HEENT: Pupils round, reactive, EOMI, sclera clear. Mouth moist and overall normal  appearing Lungs: CTAB no w/c/r, good air movement, overall a normal exam Heart: Regular with faint, hard to hear S1/2 but regular and not tachycardic, no  m/g Abd: Obese, a bit distended and firm, but non tender, not distended, no facial  griamcing to palpation Extrem: warm, perfusing normally grossly, increased bulk but normal tone. Radials  moderately well palpated. LLE with sock on that has blood soaked through, pt begs  to not remove the sock to examine the wound, so this was deferred.  Neuro: Alert, attentive, conversant, Cn 2-12 intact, moves extremities on his own,  grossly non-focal   Labs & Imaging Results for orders placed during the hospital encounter of 07/27/11 (from the past 48 hour(s))  CBC     Status: Abnormal   Collection Time   07/27/11  5:01 PM      Component Value Range Comment   WBC 5.3  4.0 - 10.5 (K/uL)     RBC 3.46 (*) 4.22 - 5.81 (MIL/uL)    Hemoglobin 10.3 (*) 13.0 - 17.0 (g/dL)    HCT 40.9 (*) 81.1 - 52.0 (%)    MCV 93.1  78.0 - 100.0 (fL)    MCH 29.8  26.0 - 34.0 (pg)    MCHC 32.0  30.0 - 36.0 (g/dL)    RDW 91.4  78.2 - 95.6 (%)    Platelets 180  150 - 400 (K/uL)   BASIC METABOLIC PANEL  Status: Normal   Collection Time   07/27/11  5:01 PM      Component Value Range Comment   Sodium 139  135 - 145 (mEq/L)    Potassium 4.1  3.5 - 5.1 (mEq/L)    Chloride 98  96 - 112 (mEq/L)    CO2 32  19 - 32 (mEq/L)    Glucose, Bld 98  70 - 99 (mg/dL)    BUN 13  6 - 23 (mg/dL)    Creatinine, Ser 7.82  0.50 - 1.35 (mg/dL)    Calcium 9.0  8.4 - 10.5 (mg/dL)    GFR calc non Af Amer >90  >90 (mL/min)    GFR calc Af Amer >90  >90 (mL/min)   PROTIME-INR     Status: Abnormal   Collection Time   07/27/11  5:01 PM      Component Value Range Comment   Prothrombin Time 36.5 (*) 11.6 - 15.2 (seconds)    INR 3.61 (*) 0.00 - 1.49     No results found.  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 [facility] is asked to admit for medicine co-management.   1. LLE wound: Per surgery recommendations. He has been started on Vanco/Zosyn,  with pharmacy consultation. Unclear to me if going for debridement.   2. DM: Sugars at present by BMP were normal. He has been continued on Repaglinide  and Metformin, the latter of which should be stopped if he gets any contrast load  this admission, but can likely be continued if none planned. I have ordered him  for CBG monitoring  3. H/o renal insufficiency: Cr peaked to 3.5 during last admission from baseline  0.8, and is currently 0.8 within baseline. Pharmacy was consulted for vanco  monitoring, since that was thought to contribute to prior ARF.   However, pt and wife are also quite concerned about him being on  lisinopril. They  do not think he has been on it since 02/2011, when it was thought to also  contribute to his ARF. I have stopped it for now, and will request pharmacy to  call his outpt pharmacy to see if this has actually was being prescribed.   4. H/o DVT's, on coumadin: Minimally elevated. Pharmacy monitoring has been  requested, would hold this a day and let it drift down.   5. Otherwise, no apparent medical conditions.      Thank you for allowing [place] to participate in the care of this very nice gentleman.  I will have a [facility] check up on him tomorrow during the day, and  please don't hesitate to contact us with any questions.   Other plans as per orders.  Neko Mcgeehan 07/28/2011, 12:37 AM

## 2011-07-28 NOTE — Progress Notes (Signed)
PHARMACY - ANTICOAGULATION CONSULT INITIAL NOTE  Pharmacy Consult for: Coumadin  Indication: H/o DVT    Patient Data:   Allergies: No Known Allergies  Patient Measurements: Height: 5\' 11"  (180.3 cm) Weight: 318 lb (144.244 kg) IBW/kg (Calculated) : 75.3    Vital Signs: Temp:  [98 F (36.7 C)-99.5 F (37.5 C)] 99.5 F (37.5 C) (04/06 2230) Pulse Rate:  [60-78] 68  (04/06 2230) Resp:  [16-18] 18  (04/06 2230) BP: (119-150)/(48-66) 126/55 mmHg (04/06 2230) SpO2:  [95 %-99 %] 99 % (04/06 2230) Weight:  [318 lb (144.244 kg)] 318 lb (144.244 kg) (04/06 1413)  Intake/Output from previous day: No intake or output data in the 24 hours ending 07/28/11 0100  Labs:  Basename 07/27/11 1701  HGB 10.3*  HCT 32.2*  PLT 180  APTT --  LABPROT 36.5*  INR 3.61*  HEPARINUNFRC --  CREATININE 0.87  CKTOTAL --  CKMB --  TROPONINI --   Estimated Creatinine Clearance: 131.4 ml/min (by C-G formula based on Cr of 0.87).  Medical History: Past Medical History  Diagnosis Date  . Diabetes mellitus   . COPD (chronic obstructive pulmonary disease)   . GERD (gastroesophageal reflux disease)   . Asthma   . Shortness of breath   . Leg pain   . CHF (congestive heart failure)   . Peripheral vascular disease   . Sleep apnea     uses bipap, sleep study done in [place] 2 years ago, dr Cala Bradford byrd  . Anemia   . Headache     sinus headaches  . Cancer     benign skin cancer    Scheduled medications:     . albuterol  2.5 mg Nebulization Q6H  . fentaNYL  50 mcg Transdermal Q72H  . ferrous sulfate  325 mg Oral TID  . Fluticasone-Salmeterol  1 puff Inhalation Q12H  . furosemide  40 mg Oral BID  . insulin aspart  0-9 Units Subcutaneous TID WC  . metFORMIN  500 mg Oral QAC supper  . montelukast  10 mg Oral QHS  . oxyCODONE  15 mg Oral Q12H  . oxyCODONE-acetaminophen  2 tablet Oral Once  . piperacillin-tazobactam (ZOSYN)  IV  3.375 g Intravenous Q8H  . potassium chloride  10 mEq  Oral Daily  . repaglinide  1 mg Oral QAC supper  . simvastatin  5 mg Oral q1800  . tiotropium  18 mcg Inhalation Daily  . vancomycin  2,000 mg Intravenous Once  . vancomycin  1,000 mg Intravenous Q8H  . warfarin  5-7.5 mg Oral Daily  . DISCONTD: lisinopril  10 mg Oral Daily  . DISCONTD: repaglinide-metformin  1 tablet Oral QAC supper     Assessment:  61 y.o. male admitted on 07/27/2011, with h/o DVT. Pharmacy consulted to manage Coumadin. INR (3.6) is above-goal. Home Coumadin regimen of 7.5mg  daily, except 5mg  on Saturday and Sunday.   Goal of Therapy:  1. INR 2-3  Plan:  1.  Follow-up daily PT / INR  Onalee Hua C. Thad Ranger, PharmD 07/28/2011, 1:00 AM

## 2011-07-29 LAB — BASIC METABOLIC PANEL
CO2: 34 mEq/L — ABNORMAL HIGH (ref 19–32)
Calcium: 8.8 mg/dL (ref 8.4–10.5)
Creatinine, Ser: 1.04 mg/dL (ref 0.50–1.35)
GFR calc non Af Amer: 76 mL/min — ABNORMAL LOW (ref >90)
Glucose, Bld: 112 mg/dL — ABNORMAL HIGH (ref 70–99)
Potassium: 3.7 mEq/L (ref 3.5–5.1)

## 2011-07-29 LAB — GLUCOSE, CAPILLARY: Glucose-Capillary: 117 mg/dL — ABNORMAL HIGH (ref 70–99)

## 2011-07-29 LAB — PROTIME-INR: INR: 2.64 — ABNORMAL HIGH (ref 0.00–1.49)

## 2011-07-29 MED ORDER — SULFAMETHOXAZOLE-TMP DS 800-160 MG PO TABS: 1.0000 | ORAL_TABLET | Freq: Two times a day (BID) | ORAL | Status: AC

## 2011-07-29 MED ORDER — HYDROMORPHONE HCL PF 1 MG/ML IJ SOLN
INTRAMUSCULAR | Status: AC
  Administered 2011-07-29: 1 mg
  Filled 2011-07-29: qty 1

## 2011-07-29 MED ORDER — HYDROMORPHONE HCL PF 1 MG/ML IJ SOLN: 1.0000 mg | Freq: Once | INTRAMUSCULAR | Status: DC

## 2011-07-29 NOTE — Discharge Summary (Signed)
Physician Discharge Summary  Patient ID: Jeremy [name] MRN: [id number] DOB/AGE: 60-Oct-1952 60 y.o.  Admit date: 07/27/2011 Discharge date: 07/29/2011  Admission Diagnoses: Cellulitis left leg  Discharge Diagnoses:  Principal Problem:  *Cellulitis of left lower leg   Discharged Condition: stable  Hospital Course: Patient's hospital course was essentially unremarkable. He was admitted started on IV antibiotics and after IV vancomycin and Zosyn his cellulitis resolves quite rapidly. Patient was discharged to home in stable condition. The wound bed had good beefy granulation tissue no purulent drainage.  Consults: None  Significant Diagnostic Studies: labs: Routine labs  Treatments: antibiotics: vancomycin and Zosyn  Discharge Exam: Blood pressure 135/79, pulse 67, temperature 98.3 F (36.8 C), temperature source Oral, resp. rate 20, height 5\' 11"  (1.803 m), weight 144.244 kg (318 lb), SpO2 97.00%. Incision/Wound: wound had good beefy granulation tissue no enlargement of the wound no purulent drainage no surrounding cellulitis at time of discharge.  Disposition: 01-Home or Self Care  Discharge Orders    Future Orders Please Complete By Expires   Diet - low sodium heart healthy      Call MD / Call 911      Comments:   If you experience chest pain or shortness of breath, CALL 911 and be transported to the hospital emergency room.  If you develope a fever above 101 F, pus (white drainage) or increased drainage or redness at the wound, or calf pain, call your surgeon's office.   Constipation Prevention      Comments:   Drink plenty of fluids.  Prune juice may be helpful.  You may use a stool softener, such as Colace (over the counter) 100 mg twice a day.  Use MiraLax (over the counter) for constipation as needed.   Increase activity slowly as tolerated      Weight Bearing as taught in Physical Therapy      Comments:   Use a walker or crutches as instructed.     Medication List  As  of 07/29/2011  6:39 AM   TAKE these medications         albuterol (2.5 MG/3ML) 0.083% nebulizer solution   Commonly known as: PROVENTIL   Take 2.5 mg by nebulization every 6 (six) hours as needed. For shortness of breath      ciprofloxacin 500 MG tablet   Commonly known as: CIPRO   Take 500 mg by mouth 2 (two) times daily.      clonazePAM 0.5 MG tablet   Commonly known as: KLONOPIN   Take 0.5 mg by mouth 2 (two) times daily as needed. For anxiety      ferrous sulfate 325 (65 FE) MG tablet   Take 325 mg by mouth 3 (three) times daily.      Fluticasone-Salmeterol 500-50 MCG/DOSE Aepb   Commonly known as: ADVAIR   Inhale 1 puff into the lungs every 12 (twelve) hours.      furosemide 40 MG tablet   Commonly known as: LASIX   Take 40 mg by mouth 2 (two) times daily.      montelukast 10 MG tablet   Commonly known as: SINGULAIR   Take 10 mg by mouth at bedtime.      oxyCODONE 15 MG Tb12   Commonly known as: OXYCONTIN   Take 7.5 mg by mouth every 6 (six) hours as needed. For pain      potassium chloride 10 MEQ CR tablet   Commonly known as: KLOR-CON   Take 10 mEq by mouth  daily.      pravastatin 10 MG tablet   Commonly known as: PRAVACHOL   Take 10 mg by mouth daily.      repaglinide-metformin 1-500 MG tablet   Commonly known as: PRANDIMET   Take 1 tablet by mouth daily before supper.      sulfamethoxazole-trimethoprim 800-160 MG per tablet   Commonly known as: BACTRIM DS   Take 1 tablet by mouth 2 (two) times daily.      tiotropium 18 MCG inhalation capsule   Commonly known as: SPIRIVA   Place 18 mcg into inhaler and inhale daily.      warfarin 5 MG tablet   Commonly known as: COUMADIN   Take 5-7.5 mg by mouth daily. Sat and Sun only pt takes 1 tab (5mg ); All other days pt takes 1.5 tabs (7.5mg )      XOPENEX HFA 45 MCG/ACT inhaler   Generic drug: levalbuterol   Inhale 1-2 puffs into the lungs every 4 (four) hours as needed. For shortness of breath            Follow-up Information    Follow up with Braniya Farrugia V, MD in 5 days.   Contact information:   [address] [place] [address] [phone]          Signed: Nadara Mustard 07/29/2011, 6:39 AM

## 2011-07-29 NOTE — Discharge Instructions (Signed)
Change socks daily.

## 2011-07-29 NOTE — Progress Notes (Signed)
Utilization Review Completed.Jeremy [name] T4/11/2011   

## 2011-07-31 LAB — WOUND CULTURE

## 2012-03-09 ENCOUNTER — Encounter (HOSPITAL_COMMUNITY): Payer: Self-pay | Admitting: Pharmacy Technician

## 2012-03-10 ENCOUNTER — Other Ambulatory Visit (HOSPITAL_COMMUNITY): Payer: Self-pay | Admitting: Orthopedic Surgery

## 2012-03-11 ENCOUNTER — Encounter (HOSPITAL_COMMUNITY)
Admission: RE | Admit: 2012-03-11 | Discharge: 2012-03-11 | Disposition: A | Discharge status: Home / Self Care | Payer: Medicare Other | Source: Ambulatory Visit | Attending: Orthopedic Surgery | Admitting: Orthopedic Surgery

## 2012-03-11 ENCOUNTER — Encounter (HOSPITAL_COMMUNITY): Payer: Self-pay

## 2012-03-11 HISTORY — DX: Psoriasis, unspecified: L40.9

## 2012-03-11 HISTORY — DX: Polyneuropathy, unspecified: G62.9

## 2012-03-11 LAB — PROTIME-INR: INR: 5.26 (ref 0.00–1.49)

## 2012-03-11 LAB — COMPREHENSIVE METABOLIC PANEL
ALT: 6 U/L (ref 0–53)
AST: 12 U/L (ref 0–37)
Alkaline Phosphatase: 86 U/L (ref 39–117)
CO2: 24 mEq/L (ref 19–32)
Chloride: 97 mEq/L (ref 96–112)
Creatinine, Ser: 1.07 mg/dL (ref 0.50–1.35)
GFR calc non Af Amer: 74 mL/min — ABNORMAL LOW (ref >90)
Potassium: 3.3 mEq/L — ABNORMAL LOW (ref 3.5–5.1)
Sodium: 134 mEq/L — ABNORMAL LOW (ref 135–145)
Total Bilirubin: 0.3 mg/dL (ref 0.3–1.2)

## 2012-03-11 LAB — CBC
HCT: 30.7 % — ABNORMAL LOW (ref 39.0–52.0)
Hemoglobin: 10.2 g/dL — ABNORMAL LOW (ref 13.0–17.0)
MCH: 29 pg (ref 26.0–34.0)
MCHC: 33.2 g/dL (ref 30.0–36.0)
RDW: 14.7 % (ref 11.5–15.5)

## 2012-03-11 LAB — APTT: aPTT: 108 seconds — ABNORMAL HIGH (ref 24–37)

## 2012-03-11 LAB — SURGICAL PCR SCREEN
MRSA, PCR: POSITIVE — AB
Staphylococcus aureus: POSITIVE — AB

## 2012-03-11 NOTE — Pre-Procedure Instructions (Signed)
20 Jeremy [name]  03/11/2012   Your procedure is scheduled on:  Friday November 22  Report to [facility] at 11:00 AM.  Call this number if you have problems the morning of surgery: [phone]   Remember:   Do not eat or drink:After Midnight.    Take these medicines the morning of surgery with A SIP OF WATER: Cipro, Use Advair and Spiriva inhalers. May take Klonopin (clonazepam), Percocet (oxycodone combination). Bring Xopenex inhaler to surgery.    Do not wear jewelry, make-up or nail polish.  Do not wear lotions, powders, or perfumes. You may wear deodorant.  Do not shave 48 hours prior to surgery. Men may shave face and neck.  Do not bring valuables to the hospital.  Contacts, dentures or bridgework may not be worn into surgery.  Leave suitcase in the car. After surgery it may be brought to your room.  For patients admitted to the hospital, checkout time is 11:00 AM the day of discharge.   Patients discharged the day of surgery will not be allowed to drive home.  Name and phone number of your driver: NA  Special Instructions: Shower using CHG 2 nights before surgery and the night before surgery.  If you shower the day of surgery use CHG.  Use special wash - you have one bottle of CHG for all showers.  You should use approximately 1/3 of the bottle for each shower.   Please read over the following fact sheets that you were given: Incentive Spirometry, Pain Booklet, Coughing and Deep Breathing and Surgical Site Infection Prevention

## 2012-03-11 NOTE — Progress Notes (Addendum)
Per pt they asked Dr. Lajoyce Corners twice and were told not to stop Coumadin prior to surgery. Left message for Elnita Maxwell in Dr. Audrie Lia office to return call if this is not correct.   Requested records from [facility] - CXR, EKG, ECHO, Sleep study, last visit note. Received EKG, ECHO, CT of chest (no CXR), PFTs. Also message received that sleep study could not be located.   Pt uses BiPAP at night, is calling [employer] in [place] for settings.   @1100  Received call from lab, critical INR of 5.26. Called result to Dr. Audrie Lia office - Cordelia Pen.   Will leave chart for anesthesia review of labs, ECHO.

## 2012-03-12 ENCOUNTER — Encounter (HOSPITAL_COMMUNITY): Payer: Self-pay | Admitting: Vascular Surgery

## 2012-03-12 MED ORDER — DEXTROSE 5 % IV SOLN
3.0000 g | INTRAVENOUS | Status: AC
  Administered 2012-03-13: 3 g via INTRAVENOUS
  Filled 2012-03-12: qty 3000

## 2012-03-12 NOTE — Consult Note (Signed)
Anesthesia chart review: Patient is a 61 year old male scheduled for excisional debridement, left leg, split thickness skin graft, wound VAC for chronic wound infection on 03/13/12.  Surgeon is Dr. Lajoyce Corners.   History includes former smoker, diabetes mellitus2, asthma, PAD, anemia, COPD, skin cancer, psoriasis, peripheral neuropathy, headaches, obstructive sleep apnea, CHF, GERD. PCP is not specified.  Patient is on Coumadin for history of LLE DVT in the 80's.  He is s/p I&D LLE on 03/13/11.  [facility] from 01/26/12 [facility]) showed NSR.  Echo on 01/27/12 [facility]) showed suboptimal images with low normal LV systolic function, EF 50%. Dilated left ventricle. Diastolic dysfunction.  CXR on 03/12/11 showed: There is no evidence of acute cardiac or pulmonary process. There is discontinuity of the posterior left fifth rib which may be related to old trauma; however, an aggressive osseous lesion cannot be excluded, and a full left rib series is recommended.   Chest CT with contrast on 01/26/12 showed no evidence for aortic dissection, aneurysm, or PE.  No evidence of pulmonary infiltrates.  Cholelithiasis.  Non-obstructing 2 mm right superior pole calcified renal stone.  Lobulated liver with upper abdominal varices, question chronic liver disease with portal HTN.  Not described above, status post gastric bypass surgery.    Spirometry on 06/21/2010 [facility]) showed a moderate to severe obstructive lung defect.  FEV1 1.27 (38%).  Labs noted.  Glucose 127.  AST/ALT WNL.  H/H 10.2/30.7.  PLT 302.  Elevated PTT 108 and critical PT/INR 44.8/5.26 (already called to patient and Cordelia Pen at Dr. Audrie Lia office on 03/11/12.)  Patient was already told to hold his Coumadin.  Plan to recheck his PT/PTT on arrival tomorrow.  Shonna Chock, PA-C

## 2012-03-13 ENCOUNTER — Ambulatory Visit (HOSPITAL_COMMUNITY): Payer: Medicare Other | Admitting: Vascular Surgery

## 2012-03-13 ENCOUNTER — Encounter (HOSPITAL_COMMUNITY): Payer: Self-pay | Admitting: *Deleted

## 2012-03-13 ENCOUNTER — Encounter (HOSPITAL_COMMUNITY): Payer: Self-pay | Admitting: Vascular Surgery

## 2012-03-13 ENCOUNTER — Ambulatory Visit (HOSPITAL_COMMUNITY): Payer: Medicare Other

## 2012-03-13 ENCOUNTER — Inpatient Hospital Stay (HOSPITAL_COMMUNITY)
Admission: RE | Admit: 2012-03-13 | Discharge: 2012-03-18 | DRG: 264 | Disposition: A | Discharge status: Home Health | Payer: Medicare Other | Source: Ambulatory Visit | Attending: Orthopedic Surgery | Admitting: Orthopedic Surgery

## 2012-03-13 ENCOUNTER — Encounter (HOSPITAL_COMMUNITY)
Admission: RE | Disposition: A | Discharge status: Home Health | Payer: Self-pay | Source: Ambulatory Visit | Attending: Orthopedic Surgery

## 2012-03-13 DIAGNOSIS — L03116 Cellulitis of left lower limb: Secondary | ICD-10-CM

## 2012-03-13 DIAGNOSIS — I739 Peripheral vascular disease, unspecified: Secondary | ICD-10-CM | POA: Diagnosis present

## 2012-03-13 DIAGNOSIS — G4730 Sleep apnea, unspecified: Secondary | ICD-10-CM | POA: Diagnosis present

## 2012-03-13 DIAGNOSIS — K219 Gastro-esophageal reflux disease without esophagitis: Secondary | ICD-10-CM | POA: Diagnosis present

## 2012-03-13 DIAGNOSIS — I509 Heart failure, unspecified: Secondary | ICD-10-CM | POA: Diagnosis present

## 2012-03-13 DIAGNOSIS — D649 Anemia, unspecified: Secondary | ICD-10-CM | POA: Diagnosis present

## 2012-03-13 DIAGNOSIS — Z85828 Personal history of other malignant neoplasm of skin: Secondary | ICD-10-CM

## 2012-03-13 DIAGNOSIS — L97909 Non-pressure chronic ulcer of unspecified part of unspecified lower leg with unspecified severity: Secondary | ICD-10-CM | POA: Diagnosis present

## 2012-03-13 DIAGNOSIS — Z87891 Personal history of nicotine dependence: Secondary | ICD-10-CM

## 2012-03-13 DIAGNOSIS — G609 Hereditary and idiopathic neuropathy, unspecified: Secondary | ICD-10-CM | POA: Diagnosis present

## 2012-03-13 DIAGNOSIS — I83229 Varicose veins of left lower extremity with both ulcer of unspecified site and inflammation: Secondary | ICD-10-CM

## 2012-03-13 DIAGNOSIS — Z86718 Personal history of other venous thrombosis and embolism: Secondary | ICD-10-CM

## 2012-03-13 DIAGNOSIS — I83219 Varicose veins of right lower extremity with both ulcer of unspecified site and inflammation: Secondary | ICD-10-CM

## 2012-03-13 DIAGNOSIS — L408 Other psoriasis: Secondary | ICD-10-CM | POA: Diagnosis present

## 2012-03-13 DIAGNOSIS — E119 Type 2 diabetes mellitus without complications: Secondary | ICD-10-CM | POA: Diagnosis present

## 2012-03-13 DIAGNOSIS — J4489 Other specified chronic obstructive pulmonary disease: Secondary | ICD-10-CM | POA: Diagnosis present

## 2012-03-13 DIAGNOSIS — J449 Chronic obstructive pulmonary disease, unspecified: Secondary | ICD-10-CM | POA: Diagnosis present

## 2012-03-13 DIAGNOSIS — M129 Arthropathy, unspecified: Secondary | ICD-10-CM | POA: Diagnosis present

## 2012-03-13 DIAGNOSIS — I872 Venous insufficiency (chronic) (peripheral): Principal | ICD-10-CM | POA: Diagnosis present

## 2012-03-13 HISTORY — PX: SKIN SPLIT GRAFT: SHX444

## 2012-03-13 LAB — GLUCOSE, CAPILLARY: Glucose-Capillary: 120 mg/dL — ABNORMAL HIGH (ref 70–99)

## 2012-03-13 SURGERY — APPLICATION, GRAFT, SKIN, SPLIT-THICKNESS
Anesthesia: General | Site: Leg Lower | Laterality: Left | Wound class: Dirty or Infected

## 2012-03-13 MED ORDER — OXYCODONE HCL 5 MG/5ML PO SOLN: 5.0000 mg | Freq: Once | ORAL | Status: DC | PRN

## 2012-03-13 MED ORDER — HYDROMORPHONE HCL PF 1 MG/ML IJ SOLN
0.5000 mg | INTRAMUSCULAR | Status: DC | PRN
  Administered 2012-03-13 – 2012-03-15 (×10): 1 mg via INTRAVENOUS
  Filled 2012-03-13 (×12): qty 1

## 2012-03-13 MED ORDER — TIOTROPIUM BROMIDE MONOHYDRATE 18 MCG IN CAPS
18.0000 ug | ORAL_CAPSULE | Freq: Every day | RESPIRATORY_TRACT | Status: DC
  Administered 2012-03-14 – 2012-03-18 (×5): 18 ug via RESPIRATORY_TRACT
  Filled 2012-03-13: qty 5

## 2012-03-13 MED ORDER — ONDANSETRON HCL 4 MG/2ML IJ SOLN: 4.0000 mg | Freq: Four times a day (QID) | INTRAMUSCULAR | Status: DC | PRN

## 2012-03-13 MED ORDER — MEPERIDINE HCL 25 MG/ML IJ SOLN: 6.2500 mg | INTRAMUSCULAR | Status: DC | PRN

## 2012-03-13 MED ORDER — OXYCODONE-ACETAMINOPHEN 5-325 MG PO TABS
2.0000 | ORAL_TABLET | ORAL | Status: DC | PRN
  Administered 2012-03-13 – 2012-03-18 (×23): 2 via ORAL
  Filled 2012-03-13 (×3): qty 2
  Filled 2012-03-13: qty 1
  Filled 2012-03-13 (×17): qty 2
  Filled 2012-03-13: qty 1
  Filled 2012-03-13 (×2): qty 2

## 2012-03-13 MED ORDER — CEFAZOLIN SODIUM-DEXTROSE 2-3 GM-% IV SOLR
2.0000 g | Freq: Four times a day (QID) | INTRAVENOUS | Status: AC
  Administered 2012-03-13 (×2): 2 g via INTRAVENOUS
  Filled 2012-03-13 (×4): qty 50

## 2012-03-13 MED ORDER — WARFARIN - PHARMACIST DOSING INPATIENT: Freq: Every day | Status: DC

## 2012-03-13 MED ORDER — PANTOPRAZOLE SODIUM 20 MG PO TBEC
20.0000 mg | DELAYED_RELEASE_TABLET | Freq: Every day | ORAL | Status: DC
  Administered 2012-03-13 – 2012-03-18 (×6): 20 mg via ORAL
  Filled 2012-03-13 (×6): qty 1

## 2012-03-13 MED ORDER — HYDROMORPHONE HCL PF 1 MG/ML IJ SOLN
0.2500 mg | INTRAMUSCULAR | Status: DC | PRN
  Administered 2012-03-13 (×4): 0.5 mg via INTRAVENOUS

## 2012-03-13 MED ORDER — ALBUTEROL SULFATE (5 MG/ML) 0.5% IN NEBU: 2.5000 mg | INHALATION_SOLUTION | Freq: Four times a day (QID) | RESPIRATORY_TRACT | Status: DC | PRN

## 2012-03-13 MED ORDER — GABAPENTIN 800 MG PO TABS
800.0000 mg | ORAL_TABLET | Freq: Every day | ORAL | Status: DC
  Filled 2012-03-13: qty 1

## 2012-03-13 MED ORDER — SODIUM CHLORIDE 0.9 % IV SOLN: INTRAVENOUS | Status: DC

## 2012-03-13 MED ORDER — METHOCARBAMOL 500 MG PO TABS
500.0000 mg | ORAL_TABLET | Freq: Four times a day (QID) | ORAL | Status: DC | PRN
  Administered 2012-03-14 – 2012-03-18 (×12): 500 mg via ORAL
  Filled 2012-03-13 (×13): qty 1

## 2012-03-13 MED ORDER — HYDROMORPHONE HCL PF 1 MG/ML IJ SOLN
INTRAMUSCULAR | Status: AC
  Filled 2012-03-13: qty 1

## 2012-03-13 MED ORDER — DEXTROSE 5 % IV SOLN
500.0000 mg | Freq: Four times a day (QID) | INTRAVENOUS | Status: DC | PRN
  Filled 2012-03-13: qty 5

## 2012-03-13 MED ORDER — MONTELUKAST SODIUM 10 MG PO TABS
10.0000 mg | ORAL_TABLET | Freq: Every day | ORAL | Status: DC
Start: 2012-03-13 — End: 2012-03-18
  Administered 2012-03-13 – 2012-03-17 (×5): 10 mg via ORAL
  Filled 2012-03-13 (×6): qty 1

## 2012-03-13 MED ORDER — SIMVASTATIN 5 MG PO TABS
5.0000 mg | ORAL_TABLET | Freq: Every day | ORAL | Status: DC
  Administered 2012-03-13 – 2012-03-17 (×5): 5 mg via ORAL
  Filled 2012-03-13 (×6): qty 1

## 2012-03-13 MED ORDER — PROMETHAZINE HCL 25 MG/ML IJ SOLN: 6.2500 mg | INTRAMUSCULAR | Status: DC | PRN

## 2012-03-13 MED ORDER — MIDAZOLAM HCL 5 MG/5ML IJ SOLN
INTRAMUSCULAR | Status: DC | PRN
  Administered 2012-03-13: 2 mg via INTRAVENOUS

## 2012-03-13 MED ORDER — TOPIRAMATE 100 MG PO TABS
100.0000 mg | ORAL_TABLET | Freq: Every day | ORAL | Status: DC
  Administered 2012-03-14 – 2012-03-17 (×4): 100 mg via ORAL
  Filled 2012-03-13 (×7): qty 1

## 2012-03-13 MED ORDER — WARFARIN SODIUM 5 MG PO TABS: 5.0000 mg | ORAL_TABLET | Freq: Every day | ORAL | Status: DC

## 2012-03-13 MED ORDER — GABAPENTIN 400 MG PO CAPS
800.0000 mg | ORAL_CAPSULE | Freq: Every day | ORAL | Status: DC
  Administered 2012-03-13 – 2012-03-17 (×5): 800 mg via ORAL
  Filled 2012-03-13 (×6): qty 2

## 2012-03-13 MED ORDER — FENTANYL 50 MCG/HR TD PT72
50.0000 ug | MEDICATED_PATCH | TRANSDERMAL | Status: DC
  Administered 2012-03-14: 50 ug via TRANSDERMAL
  Filled 2012-03-13: qty 1

## 2012-03-13 MED ORDER — MINERAL OIL LIGHT 100 % EX OIL
TOPICAL_OIL | CUTANEOUS | Status: AC
  Filled 2012-03-13: qty 25

## 2012-03-13 MED ORDER — LACTATED RINGERS IV SOLN
INTRAVENOUS | Status: DC | PRN
  Administered 2012-03-13: 13:00:00 via INTRAVENOUS

## 2012-03-13 MED ORDER — ONDANSETRON HCL 4 MG PO TABS: 4.0000 mg | ORAL_TABLET | Freq: Four times a day (QID) | ORAL | Status: DC | PRN

## 2012-03-13 MED ORDER — OXYCODONE-ACETAMINOPHEN 10-325 MG PO TABS: 1.0000 | ORAL_TABLET | ORAL | Status: DC | PRN

## 2012-03-13 MED ORDER — PROPOFOL 10 MG/ML IV BOLUS
INTRAVENOUS | Status: DC | PRN
  Administered 2012-03-13: 160 mg via INTRAVENOUS

## 2012-03-13 MED ORDER — TOPIRAMATE 25 MG PO TABS
50.0000 mg | ORAL_TABLET | Freq: Two times a day (BID) | ORAL | Status: DC
  Administered 2012-03-14 – 2012-03-17 (×7): 50 mg via ORAL
  Filled 2012-03-13 (×11): qty 2

## 2012-03-13 MED ORDER — MINERAL OIL LIGHT 100 % EX OIL
TOPICAL_OIL | CUTANEOUS | Status: DC | PRN
  Administered 2012-03-13: 1 via TOPICAL

## 2012-03-13 MED ORDER — METOCLOPRAMIDE HCL 5 MG/ML IJ SOLN: 5.0000 mg | Freq: Three times a day (TID) | INTRAMUSCULAR | Status: DC | PRN

## 2012-03-13 MED ORDER — METOCLOPRAMIDE HCL 10 MG PO TABS: 5.0000 mg | ORAL_TABLET | Freq: Three times a day (TID) | ORAL | Status: DC | PRN

## 2012-03-13 MED ORDER — OXYCODONE HCL 5 MG PO TABS: 5.0000 mg | ORAL_TABLET | Freq: Once | ORAL | Status: DC | PRN

## 2012-03-13 MED ORDER — FENTANYL CITRATE 0.05 MG/ML IJ SOLN
INTRAMUSCULAR | Status: DC | PRN
  Administered 2012-03-13: 100 ug via INTRAVENOUS
  Administered 2012-03-13: 150 ug via INTRAVENOUS

## 2012-03-13 MED ORDER — FUROSEMIDE 40 MG PO TABS
40.0000 mg | ORAL_TABLET | Freq: Every day | ORAL | Status: DC
  Administered 2012-03-13 – 2012-03-17 (×5): 40 mg via ORAL
  Filled 2012-03-13 (×6): qty 1

## 2012-03-13 MED ORDER — TOPIRAMATE 25 MG PO TABS: 50.0000 mg | ORAL_TABLET | Freq: Three times a day (TID) | ORAL | Status: DC

## 2012-03-13 MED ORDER — CLONAZEPAM 0.5 MG PO TABS
0.5000 mg | ORAL_TABLET | Freq: Two times a day (BID) | ORAL | Status: DC | PRN
Start: 2012-03-13 — End: 2012-03-18
  Administered 2012-03-15 – 2012-03-17 (×3): 0.5 mg via ORAL
  Filled 2012-03-13 (×3): qty 1

## 2012-03-13 MED ORDER — LEVALBUTEROL TARTRATE 45 MCG/ACT IN AERO
1.0000 | INHALATION_SPRAY | RESPIRATORY_TRACT | Status: DC | PRN
  Filled 2012-03-13: qty 15

## 2012-03-13 MED ORDER — METFORMIN HCL 500 MG PO TABS
500.0000 mg | ORAL_TABLET | Freq: Every day | ORAL | Status: DC
  Administered 2012-03-13 – 2012-03-18 (×6): 500 mg via ORAL
  Filled 2012-03-13 (×6): qty 1

## 2012-03-13 MED ORDER — LIDOCAINE HCL (CARDIAC) 20 MG/ML IV SOLN
INTRAVENOUS | Status: DC | PRN
  Administered 2012-03-13: 100 mg via INTRAVENOUS

## 2012-03-13 MED ORDER — POTASSIUM CHLORIDE CRYS ER 20 MEQ PO TBCR
20.0000 meq | EXTENDED_RELEASE_TABLET | Freq: Every day | ORAL | Status: DC
  Administered 2012-03-13 – 2012-03-18 (×6): 20 meq via ORAL
  Filled 2012-03-13 (×7): qty 1

## 2012-03-13 MED ORDER — MOMETASONE FURO-FORMOTEROL FUM 200-5 MCG/ACT IN AERO
2.0000 | INHALATION_SPRAY | Freq: Two times a day (BID) | RESPIRATORY_TRACT | Status: DC
  Administered 2012-03-14 – 2012-03-18 (×9): 2 via RESPIRATORY_TRACT
  Filled 2012-03-13: qty 8.8

## 2012-03-13 SURGICAL SUPPLY — 43 items
BLADE DERMATOME SS (BLADE) ×2 IMPLANT
BNDG COHESIVE 6X5 TAN STRL LF (GAUZE/BANDAGES/DRESSINGS) IMPLANT
BNDG ESMARK 4X9 LF (GAUZE/BANDAGES/DRESSINGS) ×2 IMPLANT
BNDG GAUZE STRTCH 6 (GAUZE/BANDAGES/DRESSINGS) IMPLANT
CLOTH BEACON ORANGE TIMEOUT ST (SAFETY) ×2 IMPLANT
COVER SURGICAL LIGHT HANDLE (MISCELLANEOUS) ×2 IMPLANT
CUFF TOURNIQUET SINGLE 18IN (TOURNIQUET CUFF) IMPLANT
CUFF TOURNIQUET SINGLE 24IN (TOURNIQUET CUFF) IMPLANT
DEPRESSOR TONGUE BLADE STERILE (MISCELLANEOUS) ×2 IMPLANT
DERMACARRIERS GRAFT 1 TO 1.5 (DISPOSABLE) ×2
DRAPE INCISE IOBAN 66X45 STRL (DRAPES) ×2 IMPLANT
DRAPE U-SHAPE 47X51 STRL (DRAPES) ×2 IMPLANT
DRSG ADAPTIC 3X8 NADH LF (GAUZE/BANDAGES/DRESSINGS) IMPLANT
DRSG MEPITEL 4X7.2 (GAUZE/BANDAGES/DRESSINGS) ×4 IMPLANT
DRSG VAC ATS LRG SENSATRAC (GAUZE/BANDAGES/DRESSINGS) ×2 IMPLANT
DURAPREP 26ML APPLICATOR (WOUND CARE) IMPLANT
ELECT REM PT RETURN 9FT ADLT (ELECTROSURGICAL) ×2
ELECTRODE REM PT RTRN 9FT ADLT (ELECTROSURGICAL) ×1 IMPLANT
GLOVE BIOGEL PI IND STRL 9 (GLOVE) ×1 IMPLANT
GLOVE BIOGEL PI INDICATOR 9 (GLOVE) ×1
GLOVE SURG ORTHO 9.0 STRL STRW (GLOVE) ×2 IMPLANT
GOWN PREVENTION PLUS XLARGE (GOWN DISPOSABLE) ×2 IMPLANT
GOWN SRG XL XLNG 56XLVL 4 (GOWN DISPOSABLE) ×1 IMPLANT
GOWN STRL NON-REIN XL XLG LVL4 (GOWN DISPOSABLE) ×1
GRAFT DERMACARRIERS 1 TO 1.5 (DISPOSABLE) ×1 IMPLANT
KIT BASIN OR (CUSTOM PROCEDURE TRAY) ×2 IMPLANT
KIT ROOM TURNOVER OR (KITS) ×2 IMPLANT
MANIFOLD NEPTUNE II (INSTRUMENTS) ×2 IMPLANT
NEEDLE HYPO 25GX1X1/2 BEV (NEEDLE) IMPLANT
NS IRRIG 1000ML POUR BTL (IV SOLUTION) ×2 IMPLANT
PACK ORTHO EXTREMITY (CUSTOM PROCEDURE TRAY) ×2 IMPLANT
PAD ARMBOARD 7.5X6 YLW CONV (MISCELLANEOUS) ×4 IMPLANT
PAD CAST 4YDX4 CTTN HI CHSV (CAST SUPPLIES) IMPLANT
PADDING CAST COTTON 4X4 STRL (CAST SUPPLIES)
SPONGE GAUZE 4X4 12PLY (GAUZE/BANDAGES/DRESSINGS) IMPLANT
SPONGE LAP 18X18 X RAY DECT (DISPOSABLE) ×2 IMPLANT
SUCTION FRAZIER TIP 10 FR DISP (SUCTIONS) IMPLANT
SUT ETHILON 4 0 PS 2 18 (SUTURE) ×2 IMPLANT
SYR CONTROL 10ML LL (SYRINGE) IMPLANT
TOWEL OR 17X24 6PK STRL BLUE (TOWEL DISPOSABLE) ×2 IMPLANT
TOWEL OR 17X26 10 PK STRL BLUE (TOWEL DISPOSABLE) ×2 IMPLANT
TUBE CONNECTING 12X1/4 (SUCTIONS) IMPLANT
WATER STERILE IRR 1000ML POUR (IV SOLUTION) ×2 IMPLANT

## 2012-03-13 NOTE — H&P (Signed)
Jeremy [name] is an 61 y.o. male.   Chief Complaint: Chronic venous stasis ulceration left leg. HPI: Patient is a 61 year old gentleman with over a 30 year history of venous stasis ulceration left leg. Ulcers have decreased in size significantly they have stalled the healing process and patient presents at this time for split thickness skin graft.  Past Medical History  Diagnosis Date  . Diabetes mellitus   . GERD (gastroesophageal reflux disease)   . Asthma   . Shortness of breath   . Leg pain   . CHF (congestive heart failure)   . Peripheral vascular disease   . Sleep apnea     uses bipap, sleep study done in [place] 2 years ago, dr Jeremy [name]  . Anemia   . Headache     sinus headaches  . COPD (chronic obstructive pulmonary disease)     3L continuous  . Peripheral neuropathy   . Cancer     hx basal cell skin cancer  . Psoriasis   . DVT (deep venous thrombosis)     LLE DVT '80's    Past Surgical History  Procedure Date  . Arm surgery   . Vein ligation and stripping   . Tummy tuck   . Stomach stapling   . Skin grafts   . I&d extremity 03/13/2011    Procedure: IRRIGATION AND DEBRIDEMENT EXTREMITY;  Surgeon: Jeremy Mustard, MD;  Location: [facility] OR;  Service: Orthopedics;  Laterality: Left;    Family History  Problem Relation Age of Onset  . Heart disease Brother    Social History:  reports that he quit smoking about 21 years ago. His smoking use included Cigarettes. He has a 3 pack-year smoking history. He does not have any smokeless tobacco history on file. He reports that he drinks alcohol. He reports that he does not use illicit drugs.  Allergies: No Known Allergies  No prescriptions prior to admission    Results for orders placed during the hospital encounter of 03/11/12 (from the past 48 hour(s))  SURGICAL PCR SCREEN     Status: Abnormal   Collection Time   03/11/12  9:40 AM      Component Value Range Comment   MRSA, PCR POSITIVE (*) NEGATIVE    Staphylococcus  aureus POSITIVE (*) NEGATIVE   APTT     Status: Abnormal   Collection Time   03/11/12  9:40 AM      Component Value Range Comment   aPTT 108 (*) 24 - 37 seconds   CBC     Status: Abnormal   Collection Time   03/11/12  9:40 AM      Component Value Range Comment   WBC 8.1  4.0 - 10.5 K/uL    RBC 3.52 (*) 4.22 - 5.81 MIL/uL    Hemoglobin 10.2 (*) 13.0 - 17.0 g/dL    HCT 16.1 (*) 09.6 - 52.0 %    MCV 87.2  78.0 - 100.0 fL    MCH 29.0  26.0 - 34.0 pg    MCHC 33.2  30.0 - 36.0 g/dL    RDW 04.5  40.9 - 81.1 %    Platelets 302  150 - 400 K/uL   COMPREHENSIVE METABOLIC PANEL     Status: Abnormal   Collection Time   03/11/12  9:40 AM      Component Value Range Comment   Sodium 134 (*) 135 - 145 mEq/L    Potassium 3.3 (*) 3.5 - 5.1 mEq/L  Chloride 97  96 - 112 mEq/L    CO2 24  19 - 32 mEq/L    Glucose, Bld 127 (*) 70 - 99 mg/dL    BUN 18  6 - 23 mg/dL    Creatinine, Ser 1.61  0.50 - 1.35 mg/dL    Calcium 9.2  8.4 - 09.6 mg/dL    Total Protein 6.8  6.0 - 8.3 g/dL    Albumin 2.7 (*) 3.5 - 5.2 g/dL    AST 12  0 - 37 U/L    ALT 6  0 - 53 U/L    Alkaline Phosphatase 86  39 - 117 U/L    Total Bilirubin 0.3  0.3 - 1.2 mg/dL    GFR calc non Af Amer 74 (*) >90 mL/min    GFR calc Af Amer 85 (*) >90 mL/min   PROTIME-INR     Status: Abnormal   Collection Time   03/11/12  9:40 AM      Component Value Range Comment   Prothrombin Time 44.8 (*) 11.6 - 15.2 seconds    INR 5.26 (*) 0.00 - 1.49    No results found.  Review of Systems  All other systems reviewed and are negative.    There were no vitals taken for this visit. Physical Exam  On examination patient still has clear serous drainage from the venous stasis ulcers left leg. The ulcers have now decreased in size over the past 6 weeks and patient presents at this time for split thickness skin graft. Assessment/Plan Chronic venous stasis ulceration left lower extremity.  Plan. Will plan for split thickness skin graft with  application of a wound VAC. Plan for discharge to home next week  [name],Jeremy V 03/13/2012, 6:44 AM

## 2012-03-13 NOTE — Progress Notes (Signed)
ANTICOAGULATION CONSULT NOTE - Initial Consult  Pharmacy Consult for warfarin Indication: history of DVT, VTE prophylaxis  Vital Signs: Temp: 97.7 F (36.5 C) (11/22 1739) Temp src: Oral (11/22 1029) BP: 117/58 mmHg (11/22 1739) Pulse Rate: 90  (11/22 1739)  Labs:  Basename 03/13/12 1052 03/11/12 0940  HGB -- 10.2*  HCT -- 30.7*  PLT -- 302  APTT 113* 108*  LABPROT 37.7* 44.8*  INR 4.17* 5.26*  HEPARINUNFRC -- --  CREATININE -- 1.07  CKTOTAL -- --  CKMB -- --  TROPONINI -- --    The CrCl is unknown because both a height and weight (above a minimum accepted value) are required for this calculation.   Medical History: Past Medical History  Diagnosis Date  . Diabetes mellitus   . GERD (gastroesophageal reflux disease)   . Asthma   . Shortness of breath   . Leg pain   . CHF (congestive heart failure)   . Peripheral vascular disease   . Sleep apnea     uses bipap, sleep study done in [place] 2 years ago, dr Cala Bradford byrd  . Anemia   . Headache     sinus headaches  . COPD (chronic obstructive pulmonary disease)     3L continuous  . Peripheral neuropathy   . Cancer     hx basal cell skin cancer  . Psoriasis   . DVT (deep venous thrombosis)     LLE DVT '80's    Medications:  Warfarin 5mg  daily pta - noted last dose on 03/11/12  Assessment: 62 year old male s/p I&D with skin graft. It appears the patient was confused on whether or not  to hold his anticoagulation and stopped taking his warfarin later than what was likely intended. His last dose was reportedly taken on Wednesday 11/20, at that time his INR was 5.2. This morning prior to surgery his INR was reported as 4.17, no reversal or doses of vitamin k were noted as given.   No gross bleeding noted post-op, given still very elevated INR will hold warfarin tonight, expect to be able to start at lower dose Saturday or Sunday.  Goal of Therapy:  INR 2-3 Monitor platelets by anticoagulation protocol: Yes     Plan:  No warfarin tonight due to elevated INR INR daily  Severiano Gilbert 03/13/2012,6:15 PM

## 2012-03-13 NOTE — Anesthesia Procedure Notes (Signed)
Procedure Name: LMA Insertion Date/Time: 03/13/2012 12:58 PM Performed by: Arlice Colt B Pre-anesthesia Checklist: Patient identified, Emergency Drugs available, Suction available, Patient being monitored and Timeout performed Patient Re-evaluated:Patient Re-evaluated prior to inductionOxygen Delivery Method: Circle system utilized Preoxygenation: Pre-oxygenation with 100% oxygen Intubation Type: IV induction LMA: LMA inserted LMA Size: 4.0 Number of attempts: 1 Placement Confirmation: positive ETCO2 and breath sounds checked- equal and bilateral Tube secured with: Tape Dental Injury: Teeth and Oropharynx as per pre-operative assessment

## 2012-03-13 NOTE — Progress Notes (Signed)
Pt setup on his home VPAP unit. Setting per home settings already pre set and bleed in of 2l O2 for what patient is on. Pt ok with setup and can place and remove VPAP as needed. MAchine checked out for now and [employer] will need to check it out tomorrow.

## 2012-03-13 NOTE — Anesthesia Postprocedure Evaluation (Signed)
  Anesthesia Post-op Note  Patient: Jeremy [name]  Procedure(s) Performed: Procedure(s) (LRB) with comments: SKIN GRAFT SPLIT THICKNESS (Left) - Excisional Debridement Left Leg, Split Thickness Skin Graft, Wound VAC  Patient Location: PACU  Anesthesia Type:General  Level of Consciousness: awake  Airway and Oxygen Therapy: Patient Spontanous Breathing  Post-op Pain: mild  Post-op Assessment: Post-op Vital signs reviewed  Post-op Vital Signs: stable  Complications: No apparent anesthesia complications

## 2012-03-13 NOTE — Transfer of Care (Signed)
Immediate Anesthesia Transfer of Care Note  Patient: Jeremy [name]  Procedure(s) Performed: Procedure(s) (LRB) with comments: SKIN GRAFT SPLIT THICKNESS (Left) - Excisional Debridement Left Leg, Split Thickness Skin Graft, Wound VAC  Patient Location: PACU  Anesthesia Type:General  Level of Consciousness: awake, alert  and oriented  Airway & Oxygen Therapy: Patient Spontanous Breathing and Patient connected to nasal cannula oxygen  Post-op Assessment: Report given to PACU RN and Post -op Vital signs reviewed and stable  Post vital signs: Reviewed and stable  Complications: No apparent anesthesia complications

## 2012-03-13 NOTE — Op Note (Signed)
OPERATIVE REPORT  DATE OF SURGERY: 03/13/2012  PATIENT:  Jeremy [name],  61 y.o. male  PRE-OPERATIVE DIAGNOSIS:  Chronic Wound Left Leg  POST-OPERATIVE DIAGNOSIS: same  PROCEDURE:  Procedure(s): SKIN GRAFT SPLIT THICKNESS 10 x 20 cm area. Application of the large wound VAC.  SURGEON:  Surgeon(s): Nadara Mustard, MD  ANESTHESIA:   general  EBL:  Minimal ML  SPECIMEN:  No Specimen  TOURNIQUET:  * No tourniquets in log *  PROCEDURE DETAILS: Patient is a 61 year old gentleman who has had a 30 year history of venous stasis ulceration to the left lower extremity. Patient has undergone conservative treatment with a silver alpacas sock and has had excellent resolution of the wound bed with the wound bed now flattened level with the remainder of the surface there is 100% beefy granulation tissue and do to the improvement of the wound bed patient presents at this time for application of split thickness skin graft. Risks and benefits were discussed including persistent infection nonhealing of the skin graft potential for additional surgery. Patient states he understands was pursued this time. Description of procedure patient brought to the operating room and underwent a general anesthetic. After adequate levels of anesthesia were obtained patient's left lower extremity was scrubbed and prepped using Hibiclens and draped into a sterile field. Using the Zimmer dermatome a split thickness skin graft was obtained from the thigh. This was then meshed to 1-1.5. The skin bed was then prepared cleansed back to bleeding viable granulation tissue this was irrigated the skin graft was then applied and stapled in place. Do to the brawny edema of the skin was not felt that a suture with holding through this tissue. A Mepilex traction a Mepitel dressing was then applied over the skin graft and this was also stapled in place. A wound VAC was then applied and this was set 4-75 mm of suction continuous. Patient was  extubated taken to the PACU in stable condition.  PLAN OF CARE: Admit to inpatient   PATIENT DISPOSITION:  PACU - hemodynamically stable.   Nadara Mustard, MD 03/13/2012 1:33 PM

## 2012-03-13 NOTE — Anesthesia Preprocedure Evaluation (Signed)
Anesthesia Evaluation  Patient identified by MRN, date of birth, ID band Patient awake    Reviewed: Allergy & Precautions, H&P , NPO status , Patient's Chart, lab work & pertinent test results, reviewed documented beta blocker date and time   Airway Mallampati: I  Neck ROM: Full    Dental  (+) Edentulous Upper and Edentulous Lower   Pulmonary shortness of breath and Long-Term Oxygen Therapy, asthma , sleep apnea, Continuous Positive Airway Pressure Ventilation and Oxygen sleep apnea , COPD COPD inhaler and oxygen dependent, former smoker,          Cardiovascular +CHF     Neuro/Psych  Headaches,  Neuromuscular disease    GI/Hepatic GERD-  Controlled and Medicated,  Endo/Other  diabetes, Well Controlled, Insulin DependentMorbid obesity  Renal/GU      Musculoskeletal  (+) Arthritis -,   Abdominal (+) + obese,   Peds  Hematology   Anesthesia Other Findings   Reproductive/Obstetrics                           Anesthesia Physical Anesthesia Plan  ASA: III  Anesthesia Plan: General LMA   Post-op Pain Management:    Induction:   Airway Management Planned:   Additional Equipment:   Intra-op Plan:   Post-operative Plan:   Informed Consent: I have reviewed the patients History and Physical, chart, labs and discussed the procedure including the risks, benefits and alternatives for the proposed anesthesia with the patient or authorized representative who has indicated his/her understanding and acceptance.     Plan Discussed with: CRNA  Anesthesia Plan Comments:         Anesthesia Quick Evaluation

## 2012-03-14 LAB — GLUCOSE, CAPILLARY
Glucose-Capillary: 100 mg/dL — ABNORMAL HIGH (ref 70–99)
Glucose-Capillary: 122 mg/dL — ABNORMAL HIGH (ref 70–99)
Glucose-Capillary: 106 mg/dL — ABNORMAL HIGH (ref 70–99)
Glucose-Capillary: 188 mg/dL — ABNORMAL HIGH (ref 70–99)

## 2012-03-14 MED ORDER — CHLORHEXIDINE GLUCONATE CLOTH 2 % EX PADS: 6.0000 | MEDICATED_PAD | Freq: Every day | CUTANEOUS | Status: DC

## 2012-03-14 MED ORDER — MUPIROCIN 2 % EX OINT
1.0000 "application " | TOPICAL_OINTMENT | Freq: Two times a day (BID) | CUTANEOUS | Status: DC
  Administered 2012-03-14 – 2012-03-18 (×8): 1 via NASAL
  Filled 2012-03-14: qty 22

## 2012-03-14 MED ORDER — PRO-STAT SUGAR FREE PO LIQD
30.0000 mL | Freq: Every day | ORAL | Status: DC
  Administered 2012-03-14 – 2012-03-18 (×5): 30 mL via ORAL
  Filled 2012-03-14 (×5): qty 30

## 2012-03-14 MED ORDER — JUVEN PO PACK
1.0000 | PACK | Freq: Two times a day (BID) | ORAL | Status: DC
  Administered 2012-03-14 – 2012-03-18 (×8): 1 via ORAL
  Filled 2012-03-14 (×9): qty 1

## 2012-03-14 NOTE — Evaluation (Signed)
Physical Therapy Evaluation Patient Details Name: Jeremy [name] MRN: [id number] DOB: Sep 18, 1950 Today's Date: 03/14/2012 Time: 4098-1191 PT Time Calculation (min): 31 min  PT Assessment / Plan / Recommendation Clinical Impression  Pt s/p STSG to venous ulcer in L LE. Patient tolerated mobility well considering pain and con't leakage of blood from skin graft site. Ambulation limited by pain. Anticpate patient to be safe to return home with spouse once approved by MD. will need [facility] RN and potentionally PT if patient unable to progress ambulation.    PT Assessment  Patient needs continued PT services    Follow Up Recommendations  Home health PT;Supervision/Assistance - 24 hour    Does the patient have the potential to tolerate intense rehabilitation      Barriers to Discharge Decreased caregiver support wife works during the day    Engineer, agricultural with 5" wheels    Recommendations for Other Services     Frequency Min 5X/week    Precautions / Restrictions Precautions Precautions: Fall Required Braces or Orthoses:  (wound vac) Restrictions Weight Bearing Restrictions: Yes LLE Weight Bearing: Weight bearing as tolerated   Pertinent Vitals/Pain 10/10 pain post PT, RN provided pain medication      Mobility  Bed Mobility Bed Mobility: Supine to Sit Supine to Sit: 5: Supervision;HOB flat;With rails Details for Bed Mobility Assistance: increased time, labored effort Transfers Transfers: Sit to Stand;Stand to Sit;Stand Pivot Transfers Sit to Stand: 4: Min assist;With upper extremity assist;From bed Stand to Sit: 4: Min assist;With upper extremity assist;To chair/3-in-1 Stand Pivot Transfers: 4: Min assist (with RW) Details for Transfer Assistance: assist to manage lines and v/c's for safety and use of RW Ambulation/Gait Ambulation/Gait Assistance: Not tested (comment) Ambulation Distance (Feet):  (5 steps to chair, limited by pain) Assistive device:  Rolling walker Stairs: No Wheelchair Mobility Wheelchair Mobility: No    Shoulder Instructions     Exercises     PT Diagnosis: Difficulty walking  PT Problem List: Decreased strength;Decreased activity tolerance;Decreased mobility PT Treatment Interventions: Gait training;Therapeutic exercise;Functional mobility training   PT Goals Acute Rehab PT Goals PT Goal Formulation: With patient/family Time For Goal Achievement: 03/21/12 Potential to Achieve Goals: Good Pt will go Supine/Side to Sit: with modified independence;with HOB 0 degrees PT Goal: Supine/Side to Sit - Progress: Goal set today Pt will go Sit to Stand: with modified independence;with upper extremity assist (up to RW) PT Goal: Sit to Stand - Progress: Goal set today Pt will Transfer Bed to Chair/Chair to Bed: with modified independence (with RW) PT Transfer Goal: Bed to Chair/Chair to Bed - Progress: Goal set today Pt will Ambulate: >150 feet;with modified independence;with rolling walker PT Goal: Ambulate - Progress: Goal set today  Visit Information  Last PT Received On: 03/14/12 Assistance Needed: +1    Subjective Data  Subjective: Pt received supine in bed agreeable to try to get up OOB.   Prior Functioning  Home Living Lives With: Spouse Available Help at Discharge: Family;Available PRN/intermittently (wife works during the day) Type of Home: House Home Access: Level entry Home Layout: One level Bathroom Shower/Tub: Engineer, manufacturing systems: Standard Home Adaptive Equipment: Environmental consultant - rolling;Straight cane (but was wife's) Prior Function Level of Independence: Independent Able to Take Stairs?: Yes Driving: No Vocation: On disability Comments: on 3LO2 at home Communication Communication: No difficulties Dominant Hand: Right    Cognition  Overall Cognitive Status: Appears within functional limits for tasks assessed/performed Orientation Level: Appears intact for tasks assessed Behavior  During Session: [facility] for tasks performed    Extremity/Trunk Assessment Right Upper Extremity Assessment RUE ROM/Strength/Tone: Within functional levels Left Upper Extremity Assessment LUE ROM/Strength/Tone: Within functional levels Right Lower Extremity Assessment RLE ROM/Strength/Tone: Within functional levels Left Lower Extremity Assessment LLE ROM/Strength/Tone: Deficits;Due to pain LLE ROM/Strength/Tone Deficits: able to manage L LE I'ly for transfers, limited ROM due to skin graft on thigh Trunk Assessment Trunk Assessment: Normal   Balance    End of Session PT - End of Session Equipment Utilized During Treatment: Gait belt Activity Tolerance: Patient tolerated treatment well Patient left: in chair;with family/visitor present;with nursing in room;with call bell/phone within reach (Legs elevated) Nurse Communication: Mobility status  GP     Marcene Brawn 03/14/2012, 4:48 PM  Lewis Shock, PT, DPT Pager #: [phone] Office #: [phone]

## 2012-03-14 NOTE — Progress Notes (Signed)
PT has a home CPAP unit that has been set up and checked out by biomed and can use the machine when he needs it. RT will continue to monitor.

## 2012-03-14 NOTE — Progress Notes (Signed)
INITIAL ADULT NUTRITION ASSESSMENT Date: 03/14/2012   Time: 2:11 PM Reason for Assessment: MST  INTERVENTION: 30 ml Prostat daily 1 packet Juven BID  DOCUMENTATION CODES Per approved criteria  -Obesity Unspecified    ASSESSMENT: Male 61 y.o.  Dx: Chronic venous stasis ulceration left leg  Hx:  Past Medical History  Diagnosis Date  . Diabetes mellitus   . GERD (gastroesophageal reflux disease)   . Asthma   . Shortness of breath   . Leg pain   . CHF (congestive heart failure)   . Peripheral vascular disease   . Sleep apnea     uses bipap, sleep study done in [place] 2 years ago, dr Cala Bradford byrd  . Anemia   . Headache     sinus headaches  . COPD (chronic obstructive pulmonary disease)     3L continuous  . Peripheral neuropathy   . Cancer     hx basal cell skin cancer  . Psoriasis   . DVT (deep venous thrombosis)     LLE DVT '80's   Past Surgical History  Procedure Date  . Arm surgery   . Vein ligation and stripping   . Tummy tuck   . Stomach stapling   . Skin grafts   . I&d extremity 03/13/2011    Procedure: IRRIGATION AND DEBRIDEMENT EXTREMITY;  Surgeon: Nadara Mustard, MD;  Location: [facility] OR;  Service: Orthopedics;  Laterality: Left;   Related Meds:  Scheduled Meds:    . [EXPIRED]  ceFAZolin (ANCEF) IV  2 g Intravenous Q6H  . fentaNYL  50 mcg Transdermal Q72H  . furosemide  40 mg Oral Daily  . gabapentin  800 mg Oral QHS  . [EXPIRED] HYDROmorphone      . [EXPIRED] HYDROmorphone      . metFORMIN  500 mg Oral Daily  . mometasone-formoterol  2 puff Inhalation BID  . montelukast  10 mg Oral QHS  . pantoprazole  20 mg Oral Daily  . potassium chloride SA  20 mEq Oral Daily  . simvastatin  5 mg Oral q1800  . tiotropium  18 mcg Inhalation Daily  . topiramate  100 mg Oral QHS  . topiramate  50 mg Oral BID WC  . Warfarin - Pharmacist Dosing Inpatient   Does not apply q1800  . [DISCONTINUED] gabapentin  800 mg Oral QHS  . [DISCONTINUED] topiramate  50-100  mg Oral TID  . [DISCONTINUED] warfarin  5 mg Oral Daily   Continuous Infusions:    . [DISCONTINUED] sodium chloride     PRN Meds:.albuterol, clonazePAM, HYDROmorphone (DILAUDID) injection, levalbuterol, methocarbamol (ROBAXIN) IV, methocarbamol, metoCLOPramide (REGLAN) injection, metoCLOPramide, ondansetron (ZOFRAN) IV, ondansetron, oxyCODONE-acetaminophen, [DISCONTINUED]  HYDROmorphone (DILAUDID) injection, [DISCONTINUED] meperidine (DEMEROL) injection, [DISCONTINUED] oxyCODONE, [DISCONTINUED] oxyCODONE, [DISCONTINUED] oxyCODONE-acetaminophen, [DISCONTINUED] promethazine   Ht:   5'11" (180.3 cm)  Wt:  256 lb (116.3 kg)  Ideal Wt:    78.1 kg % Ideal Wt: 149%  Usual Wt:  Wt Readings from Last 10 Encounters:  03/11/12 256 lb 8 oz (116.348 kg)  07/27/11 318 lb (144.244 kg)  03/12/11 345 lb (156.491 kg)  03/12/11 345 lb (156.491 kg)  12/03/10 340 lb (154.223 kg)   % Usual Wt: 81%  BMI: 35.8 Obesity class II  Food/Nutrition Related Hx: good appetite  Labs:  CMP     Component Value Date/Time   NA 134* 03/11/2012 0940   K 3.3* 03/11/2012 0940   CL 97 03/11/2012 0940   CO2 24 03/11/2012 0940   GLUCOSE 127* 03/11/2012 0940  BUN 18 03/11/2012 0940   CREATININE 1.07 03/11/2012 0940   CALCIUM 9.2 03/11/2012 0940   PROT 6.8 03/11/2012 0940   ALBUMIN 2.7* 03/11/2012 0940   AST 12 03/11/2012 0940   ALT 6 03/11/2012 0940   ALKPHOS 86 03/11/2012 0940   BILITOT 0.3 03/11/2012 0940   GFRNONAA 74* 03/11/2012 0940   GFRAA 85* 03/11/2012 0940   CBG (last 3)   Basename 03/14/12 1130 03/14/12 0605 03/13/12 2109  GLUCAP 122* 100* 117*   No results found for this basename: HGBA1C    Intake/Output Summary (Last 24 hours) at 03/14/12 1411 Last data filed at 03/14/12 0600  Gross per 24 hour  Intake    580 ml  Output      0 ml  Net    580 ml     Diet Order: CHO Modified Medium  Supplements/Tube Feeding: none  IVF:     [DISCONTINUED] sodium chloride   Pt with over 30  year hx of venous stasis ulcerations of his left leg. Ulcers have decreased in size significantly but have stalled the healing process. Pt admitted for split thickness skin graft and application of large wound VAC on 11/22.  Pt and wife report that he started to lose weight with medication (topomax). Prior to this pt lived to eat. He describes his usual meals prior to his (ex Breakfast was 3-4 eggs, 6-8 slices of bacon, grits, 4 biscuits) he has now cut this to appropriate portions. Pt understands importance of protein and has been eating more of these foods at home. Pt is willing to try Prostat and Juven.  Estimated Nutritional Needs:   Kcal:  2000-2200 Protein:  110-130 grams Fluid:  >2 L/day  NUTRITION DIAGNOSIS: Increased nutrient needs related to wound healing as evidenced by estimated needs.   MONITORING/EVALUATION(Goals): Goal: Pt to meet >/= 90% of their estimated nutrition needs. Monitor: PO intake, weight trend, supplement acceptance, wound healing  EDUCATION NEEDS: -No education needs identified at this time  Kendell Bane RD, LDN, CNSC [phone] Weekend/After Hours Pager  03/14/2012, 2:11 PM

## 2012-03-14 NOTE — Progress Notes (Signed)
Patient ID: Jeremy [name], male   DOB: [birth year], 61 y.o.   MRN: [id number] PATIENT ID: Jeremy [name]        MRN:  [id number]          DOB/AGE: 1950-10-12 / 61 y.o.    [name], MD   Jacqualine Code, PA-C [address] [place], [place]  [address]                             [phone]   PROGRESS NOTE  Subjective:  negative for Chest Pain  negative for Shortness of Breath  negative for Nausea/Vomiting   negative for Calf Pain    Tolerating Diet: yes         Patient reports pain as mild.    Pt asleep on rounds, but discussed status with wife..comfortable night, minimal drainage from wound vac, using CPAP machine, dressings dry  Objective: Vital signs in last 24 hours:   Patient Vitals for the past 24 hrs:  BP Temp Temp src Pulse Resp SpO2  03/14/12 0600 111/56 mmHg 98.5 F (36.9 C) - 63  16  100 %  03/14/12 0200 110/52 mmHg 98.7 F (37.1 C) - 67  15  100 %  03/13/12 2200 117/50 mmHg 97.5 F (36.4 C) - 74  14  98 %  03/13/12 1739 117/58 mmHg 97.7 F (36.5 C) - 90  12  100 %  03/13/12 1552 134/63 mmHg 97.4 F (36.3 C) - 87  12  99 %  03/13/12 1524 - 98.4 F (36.9 C) - - - -  03/13/12 1516 109/62 mmHg - - 84  12  97 %  03/13/12 1515 - - - 85  13  98 %  03/13/12 1501 130/64 mmHg - - 86  13  97 %  03/13/12 1500 - - - 86  10  98 %  03/13/12 1446 114/63 mmHg - - 89  10  99 %  03/13/12 1445 - - - 88  11  99 %  03/13/12 1431 133/65 mmHg - - 89  11  98 %  03/13/12 1430 - - - 88  10  99 %  03/13/12 1416 141/65 mmHg - - 91  11  98 %  03/13/12 1415 - - - 93  13  96 %  03/13/12 1401 134/72 mmHg - - 92  14  99 %  03/13/12 1400 - - - 91  13  97 %  03/13/12 1346 144/80 mmHg - - 99  17  100 %  03/13/12 1345 - - - 99  19  100 %  03/13/12 1342 136/65 mmHg - - 101  22  100 %  03/13/12 1335 136/65 mmHg 97.5 F (36.4 C) - 101  16  -  03/13/12 1029 103/69 mmHg 97.7 F (36.5 C) Oral 80  20  100 %      Intake/Output from previous day:   11/22 0701 - 11/23 0700 In: 1080 [P.O.:240;  I.V.:840] Out: -    Intake/Output this shift:       Intake/Output      11/22 0701 - 11/23 0700 11/23 0701 - 11/24 0700   P.O. 240    I.V. 840    Total Intake 1080    Net +1080            LABORATORY DATA:  Basename 03/11/12 0940  WBC 8.1  HGB 10.2*  HCT 30.7*  PLT 302  Basename 03/11/12 0940  NA 134*  K 3.3*  CL 97  CO2 24  BUN 18  CREATININE 1.07  GLUCOSE 127*  CALCIUM 9.2   Lab Results  Component Value Date   INR 4.05* 03/14/2012   INR 4.17* 03/13/2012   INR 5.26* 03/11/2012    Recent Radiographic Studies :  Dg Chest 2 View  03/13/2012  *RADIOLOGY REPORT*  Clinical Data: Preop skin graft leg  CHEST - 2 VIEW  Comparison: 03/12/2011  Findings: COPD with hyperinflation.  Negative for heart failure or pneumonia.  No mass lesion. Defect of the right fourth rib posteriorly and defect of the left fifth rib posteriorly are unchanged may be related to prior thoracotomy.  IMPRESSION: COPD without acute abnormality.   Original Report Authenticated By: Janeece Riggers, M.D.      Examination:  General appearance: sleeping on rounds  Wound Exam: clean, dry, intact   Drainage:  Scant/small amount   Motor Exam: not evaluated this am  Sensory Exam: not evaluated this am   Vascular Exam: good cap refill to toes  Assessment:    1 Day Post-Op  Procedure(s) (LRB): SKIN GRAFT SPLIT THICKNESS (Left)  ADDITIONAL DIAGNOSIS:  Active Problems:  * No active hospital problems. *   no new problems   Plan: Physical Therapy as ordered Weight Bearing as Tolerated (WBAT)  DVT Prophylaxis:  Coumadin  DISCHARGE PLAN: Home  DISCHARGE NEEDS: has equipment         [name] W 03/14/2012 9:11 AM

## 2012-03-14 NOTE — Progress Notes (Signed)
ANTICOAGULATION CONSULT NOTE - Follow Up Consult  Pharmacy Consult for Warfarin Indication: VTE prophylaxis, history of DVT  No Known Allergies  Vital Signs: Temp: 98.5 F (36.9 C) (11/23 0600) BP: 111/56 mmHg (11/23 0600) Pulse Rate: 63  (11/23 0600)  Labs:  Basename 03/14/12 0455 03/13/12 1052 03/11/12 0940  HGB -- -- 10.2*  HCT -- -- 30.7*  PLT -- -- 302  APTT -- 113* 108*  LABPROT 36.9* 37.7* 44.8*  INR 4.05* 4.17* 5.26*  HEPARINUNFRC -- -- --  CREATININE -- -- 1.07  CKTOTAL -- -- --  CKMB -- -- --  TROPONINI -- -- --    The CrCl is unknown because both a height and weight (above a minimum accepted value) are required for this calculation.   Medications:  Scheduled:    . [COMPLETED]  ceFAZolin (ANCEF) IV  3 g Intravenous 60 min Pre-Op  .  ceFAZolin (ANCEF) IV  2 g Intravenous Q6H  . fentaNYL  50 mcg Transdermal Q72H  . furosemide  40 mg Oral Daily  . gabapentin  800 mg Oral QHS  . [EXPIRED] HYDROmorphone      . [EXPIRED] HYDROmorphone      . metFORMIN  500 mg Oral Daily  . mometasone-formoterol  2 puff Inhalation BID  . montelukast  10 mg Oral QHS  . pantoprazole  20 mg Oral Daily  . potassium chloride SA  20 mEq Oral Daily  . simvastatin  5 mg Oral q1800  . tiotropium  18 mcg Inhalation Daily  . topiramate  100 mg Oral QHS  . topiramate  50 mg Oral BID WC  . Warfarin - Pharmacist Dosing Inpatient   Does not apply q1800  . [DISCONTINUED] gabapentin  800 mg Oral QHS  . [DISCONTINUED] topiramate  50-100 mg Oral TID  . [DISCONTINUED] warfarin  5 mg Oral Daily    Assessment: Pt is a 61 y/o M s/p I&D with skin graft for chronic leg wound. Pt was unaware of whether or not to hold warfarin apparently and took his last dose Wednesday and his INR was 5.2 on admit, 4.17 on 11/22, and 4.05 this AM. No bleeding is noted post-op and warfarin has continued to be held. We will monitor the INR daily and re-start when appropriate. No CBC since 11/20. Renal function stable  on 11/20.   Goal of Therapy:  INR 2-3 Monitor platelets by anticoagulation protocol: Yes   Plan:  - Hold warfarin tonight due to elevated INR - Daily PT/INR - Monitor closely for bleeding  Abran Duke, PharmD Clinical Pharmacist Phone: [phone] Pager: [phone] 03/14/2012 8:36 AM

## 2012-03-15 LAB — GLUCOSE, CAPILLARY: Glucose-Capillary: 141 mg/dL — ABNORMAL HIGH (ref 70–99)

## 2012-03-15 MED ORDER — WARFARIN SODIUM 5 MG PO TABS
5.0000 mg | ORAL_TABLET | Freq: Once | ORAL | Status: AC
  Administered 2012-03-15: 5 mg via ORAL
  Filled 2012-03-15: qty 1

## 2012-03-15 NOTE — Progress Notes (Signed)
Patient ID: Jeremy [name], male   DOB: February 12, 1951, 61 y.o.   MRN: [id number] PATIENT ID: Jeremy [name]        MRN:  [id number]          DOB/AGE: 27-Sep-1950 / 61 y.o.    [name], MD   Jacqualine Code, PA-C [address] [place], [place]  [address]                             [phone]   PROGRESS NOTE  Subjective:  negative for Chest Pain  negative for Shortness of Breath  negative for Nausea/Vomiting   negative for Calf Pain    Tolerating Diet: yes         Patient reports pain as moderate.       Objective: Vital signs in last 24 hours:   Patient Vitals for the past 24 hrs:  BP Temp Temp src Pulse Resp SpO2  03/15/12 0843 - - - - - 100 %  03/15/12 0549 116/62 mmHg 98.2 F (36.8 C) Oral 72  20  100 %  03/14/12 2136 - - - - - 99 %  03/14/12 2055 125/51 mmHg 98.1 F (36.7 C) Oral 78  18  99 %  03/14/12 1614 99/56 mmHg 97.6 F (36.4 C) Oral 73  18  99 %      Intake/Output from previous day:   11/23 0701 - 11/24 0700 In: 560 [P.O.:560] Out: 850 [Urine:850]   Intake/Output this shift:       Intake/Output      11/23 0701 - 11/24 0700 11/24 0701 - 11/25 0700   P.O. 560    I.V.     Total Intake 560    Urine 850    Total Output 850    Net -290            LABORATORY DATA:  Basename 03/11/12 0940  WBC 8.1  HGB 10.2*  HCT 30.7*  PLT 302    Basename 03/11/12 0940  NA 134*  K 3.3*  CL 97  CO2 24  BUN 18  CREATININE 1.07  GLUCOSE 127*  CALCIUM 9.2   Lab Results  Component Value Date   INR 4.05* 03/14/2012   INR 4.17* 03/13/2012   INR 5.26* 03/11/2012    Recent Radiographic Studies :  Dg Chest 2 View  03/13/2012  *RADIOLOGY REPORT*  Clinical Data: Preop skin graft leg  CHEST - 2 VIEW  Comparison: 03/12/2011  Findings: COPD with hyperinflation.  Negative for heart failure or pneumonia.  No mass lesion. Defect of the right fourth rib posteriorly and defect of the left fifth rib posteriorly are unchanged may be related to prior thoracotomy.  IMPRESSION:  COPD without acute abnormality.   Original Report Authenticated By: Janeece Riggers, M.D.      Examination:  General appearance: alert and no distress  Wound Exam: draining from donor site left thigh  Drainage:  Moderate amount Serosanguinous exudate  Motor Exam:   Sensory Exam:  Vascular Exam:   Assessment:    2 Days Post-Op  Procedure(s) (LRB): SKIN GRAFT SPLIT THICKNESS (Left)  ADDITIONAL DIAGNOSIS:  Active Problems:  * No active hospital problems. *      Plan: Physical Therapy as ordered Touch Down Weight Bearing (TDWB)  DVT Prophylaxis:  Coumadin  DISCHARGE PLAN: Home  DISCHARGE NEEDS: Walker and wound vac   dressing changed left thigh-still with moderate pain-will need vac at home  Valeria Batman 03/15/2012 9:48 AM

## 2012-03-15 NOTE — Progress Notes (Signed)
ANTICOAGULATION CONSULT NOTE - Follow Up Consult  Pharmacy Consult for Warfarin Indication: VTE prophylaxis, history of DVT  No Known Allergies  Vital Signs: Temp: 98.2 F (36.8 C) (11/24 0549) Temp src: Oral (11/24 0549) BP: 116/62 mmHg (11/24 0549) Pulse Rate: 72  (11/24 0549)  Labs:  Basename 03/15/12 0850 03/14/12 0455 03/13/12 1052  HGB -- -- --  HCT -- -- --  PLT -- -- --  APTT -- -- 113*  LABPROT 24.8* 36.9* 37.7*  INR 2.37* 4.05* 4.17*  HEPARINUNFRC -- -- --  CREATININE -- -- --  CKTOTAL -- -- --  CKMB -- -- --  TROPONINI -- -- --    The CrCl is unknown because both a height and weight (above a minimum accepted value) are required for this calculation.   Medications:  Scheduled:     . [EXPIRED]  ceFAZolin (ANCEF) IV  2 g Intravenous Q6H  . Chlorhexidine Gluconate Cloth  6 each Topical Q0600  . feeding supplement  30 mL Oral Daily  . fentaNYL  50 mcg Transdermal Q72H  . furosemide  40 mg Oral Daily  . gabapentin  800 mg Oral QHS  . metFORMIN  500 mg Oral Daily  . mometasone-formoterol  2 puff Inhalation BID  . montelukast  10 mg Oral QHS  . mupirocin ointment  1 application Nasal BID  . nutrition supplement  1 packet Oral BID BM  . pantoprazole  20 mg Oral Daily  . potassium chloride SA  20 mEq Oral Daily  . simvastatin  5 mg Oral q1800  . tiotropium  18 mcg Inhalation Daily  . topiramate  100 mg Oral QHS  . topiramate  50 mg Oral BID WC  . Warfarin - Pharmacist Dosing Inpatient   Does not apply q1800    Assessment: Pt is a 61 y/o M s/p I&D with skin graft for chronic leg wound. Pt was unaware of whether or not to hold warfarin apparently and took his last dose Wednesday and his INR was 5.2 on admit (INR of 5.2 likely due to interaction with doxycycline), 4.17 on 11/22, and 4.05 on 11/23, INR this AM is 2.37. No bleeding is noted post-op. Renal function stable on 11/20. Give INR of 2.37 this AM, will resume warfarin.   Goal of Therapy:  INR  2-3 Monitor platelets by anticoagulation protocol: Yes   Plan:  - Warfarin 5mg  x 1 at 1800 - Daily PT/INR - Monitor closely for bleeding - Ensure no interactions with any anti-biotics prescribed at discharge, as INR of 5.2 on admit was likely due to interaction with outpatient doxycycline  Abran Duke, PharmD Clinical Pharmacist Phone: [phone] Pager: [phone] 03/15/2012 10:23 AM

## 2012-03-15 NOTE — Progress Notes (Signed)
PT Cancellation Note  Patient Details Name: Jeremy [name] MRN: [id number] DOB: 05-18-1950   Cancelled Treatment:    Reason Eval/Treat Not Completed: Pain limiting ability to participate;Other (comment) (Pt politely declined PT)   Van Clines [facility] 03/15/2012, 4:56 PM

## 2012-03-15 NOTE — Progress Notes (Signed)
PT called for help with home CPAP machine. RT plugged machine in with 3 lpm o2 bleed in. PT resting comfortably. RT will continue to monitor.

## 2012-03-16 ENCOUNTER — Encounter (HOSPITAL_COMMUNITY): Payer: Self-pay | Admitting: Orthopedic Surgery

## 2012-03-16 LAB — GLUCOSE, CAPILLARY
Glucose-Capillary: 119 mg/dL — ABNORMAL HIGH (ref 70–99)
Glucose-Capillary: 100 mg/dL — ABNORMAL HIGH (ref 70–99)
Glucose-Capillary: 114 mg/dL — ABNORMAL HIGH (ref 70–99)
Glucose-Capillary: 114 mg/dL — ABNORMAL HIGH (ref 70–99)

## 2012-03-16 LAB — PROTIME-INR: Prothrombin Time: 21 seconds — ABNORMAL HIGH (ref 11.6–15.2)

## 2012-03-16 MED ORDER — FENTANYL 50 MCG/HR TD PT72
50.0000 ug | MEDICATED_PATCH | TRANSDERMAL | Status: DC
  Administered 2012-03-17: 50 ug via TRANSDERMAL
  Filled 2012-03-16: qty 1

## 2012-03-16 MED ORDER — WARFARIN SODIUM 5 MG PO TABS
5.0000 mg | ORAL_TABLET | Freq: Once | ORAL | Status: AC
  Administered 2012-03-16: 5 mg via ORAL
  Filled 2012-03-16: qty 1

## 2012-03-16 NOTE — Progress Notes (Signed)
CARE MANAGEMENT NOTE 03/16/2012  Patient:  Jeremy [name], Jeremy [name]   Account Number:  [id number]  Date Initiated:  03/16/2012  Documentation initiated by:  Vance Peper  Subjective/Objective Assessment:   61 yr old male admitted with chronic left leg ulcers.Has wound vac.     Action/Plan:   CM spoke with patient and wife concerning home health and DME needs at discharge. The have used [facility] and [facility] in the past. CM Will wait to confirm [facility] needs.   Anticipated DC Date:     Anticipated DC Plan:        DC Planning Services  CM consult      Choice offered to / List presented to:             Status of service:  In process, will continue to follow Medicare Important Message given?   (If response is "NO", the following Medicare IM given date fields will be blank) Date Medicare IM given:   Date Additional Medicare IM given:    Discharge Disposition:    Per UR Regulation:    If discussed at Long Length of Stay Meetings, dates discussed:    Comments:

## 2012-03-16 NOTE — Progress Notes (Signed)
ANTICOAGULATION CONSULT NOTE - Follow Up Consult  Pharmacy Consult for Warfarin Indication: VTE prophylaxis, history of DVT  No Known Allergies  Vital Signs: Temp: 97.8 F (36.6 C) (11/25 0459) BP: 104/50 mmHg (11/25 0819) Pulse Rate: 90  (11/25 0819)  Labs:  Basename 03/16/12 0500 03/15/12 0850 03/14/12 0455 03/13/12 1052  HGB -- -- -- --  HCT -- -- -- --  PLT -- -- -- --  APTT -- -- -- 113*  LABPROT 21.0* 24.8* 36.9* --  INR 1.89* 2.37* 4.05* --  HEPARINUNFRC -- -- -- --  CREATININE -- -- -- --  CKTOTAL -- -- -- --  CKMB -- -- -- --  TROPONINI -- -- -- --    The CrCl is unknown because both a height and weight (above a minimum accepted value) are required for this calculation.   Medications:  Scheduled:     . Chlorhexidine Gluconate Cloth  6 each Topical Q0600  . feeding supplement  30 mL Oral Daily  . fentaNYL  50 mcg Transdermal Q72H  . furosemide  40 mg Oral Daily  . gabapentin  800 mg Oral QHS  . metFORMIN  500 mg Oral Daily  . mometasone-formoterol  2 puff Inhalation BID  . montelukast  10 mg Oral QHS  . mupirocin ointment  1 application Nasal BID  . nutrition supplement  1 packet Oral BID BM  . pantoprazole  20 mg Oral Daily  . potassium chloride SA  20 mEq Oral Daily  . simvastatin  5 mg Oral q1800  . tiotropium  18 mcg Inhalation Daily  . topiramate  100 mg Oral QHS  . topiramate  50 mg Oral BID WC  . [COMPLETED] warfarin  5 mg Oral ONCE-1800  . Warfarin - Pharmacist Dosing Inpatient   Does not apply q1800    Assessment: Today's INR is 1.89 in this 61 y/o Male on POD #3 s/p I&D with skin graft for chronic leg wound.  The patient was on coumadin 5 mg daily PTA for h/o DVT.  He was unaware of whether or not to hold warfarin apparently and took his last dose Wed. 11/20 and his INR was 5.2 on admit likely due to interaction with doxycycline. No coumadin given 11/21, 11/22 and 11/23 due to supratherapeutic INR. Coumadin resumed yesterday 11/24 with 5 mg  dose.  No CBC done since 11/20. No bleeding noted.  Renal function stable as Scr = 1.07 on 11/20.  No current antibiotics.   Goal of Therapy:  INR 2-3 Monitor platelets by anticoagulation protocol: Yes   Plan:  - Warfarin 5mg  x 1 at 1800 - Daily PT/INR - Monitor for bleeding. CBC in AM  [name], RPh Clinical Pharmacist Pager: [phone] 03/16/2012 9:40 AM

## 2012-03-16 NOTE — Progress Notes (Signed)
PT Cancellation Note  Patient Details Name: Jeremy [name] MRN: [id number] DOB: [birth year]   Cancelled Treatment:    Reason Eval/Treat Not Completed: Pain limiting ability to participate;Other (comment) Patient declining PT x 2 today due to pain.  Will return in am.   Vena Austria 03/16/2012, 5:32 PM [phone]

## 2012-03-16 NOTE — Progress Notes (Signed)
CARE MANAGEMENT NOTE 03/16/2012  Patient:  Jeremy [name], Jeremy [name]   Account Number:  [id number]  Date Initiated:  03/16/2012  Documentation initiated by:  Vance Peper  Subjective/Objective Assessment:   61 yr old male admitted with chronic left leg ulcers.Has wound vac.     Action/Plan:   CM spoke with patient and wife concerning home health and DME needs at discharge. The have used  [facility] in the past. CM Will wait to confirm [facility] needs.   Anticipated DC Date:     Anticipated DC Plan:        DC Planning Services  CM consult      Choice offered to / List presented to:             Status of service:  In process, will continue to follow Medicare Important Message given?   (If response is "NO", the following Medicare IM given date fields will be blank) Date Medicare IM given:   Date Additional Medicare IM given:    Discharge Disposition:    Per UR Regulation:    If discussed at Long Length of Stay Meetings, dates discussed:    Comments:  03/16/12 12:00 Vance Peper, RN BSN Case Manager Called intake at [phone], they are familiar with patient. Will fax orders to [phone] when ready. CM will follow.

## 2012-03-16 NOTE — Progress Notes (Signed)
RT helped put PT on home bipap unit. PT has 3 lpm o2 bleed in, tolerating good with no distress. RT will continue to monitor.

## 2012-03-16 NOTE — Progress Notes (Signed)
Patient ID: Jeremy [name], male   DOB: Oct 18, 1950, 61 y.o.   MRN: [id number] Postoperative day 3 split thickness skin graft left leg. Patient with low blood pressure we will hold his nylon. Wound VAC with good drainage. Will plan on changing the wound VAC postoperative day 5.

## 2012-03-17 ENCOUNTER — Encounter (HOSPITAL_COMMUNITY): Payer: Self-pay

## 2012-03-17 LAB — GLUCOSE, CAPILLARY
Glucose-Capillary: 99 mg/dL (ref 70–99)
Glucose-Capillary: 101 mg/dL — ABNORMAL HIGH (ref 70–99)
Glucose-Capillary: 102 mg/dL — ABNORMAL HIGH (ref 70–99)

## 2012-03-17 LAB — PROTIME-INR: INR: 1.66 — ABNORMAL HIGH (ref 0.00–1.49)

## 2012-03-17 LAB — CBC
HCT: 28.2 % — ABNORMAL LOW (ref 39.0–52.0)
Hemoglobin: 9.3 g/dL — ABNORMAL LOW (ref 13.0–17.0)
MCHC: 33 g/dL (ref 30.0–36.0)
RBC: 3.11 MIL/uL — ABNORMAL LOW (ref 4.22–5.81)
WBC: 6.6 10*3/uL (ref 4.0–10.5)

## 2012-03-17 MED ORDER — WARFARIN SODIUM 7.5 MG PO TABS
7.5000 mg | ORAL_TABLET | Freq: Once | ORAL | Status: AC
  Administered 2012-03-17: 7.5 mg via ORAL
  Filled 2012-03-17: qty 1

## 2012-03-17 NOTE — Progress Notes (Signed)
ANTICOAGULATION CONSULT NOTE - Follow Up Consult  Pharmacy Consult for Warfarin Indication:history of DVT on chronic coumadin ; VTE prophylaxis  No Known Allergies    Vital Signs: Temp: 98 F (36.7 C) (11/26 0559) Temp src: Axillary (11/26 0559) BP: 110/51 mmHg (11/26 0559) Pulse Rate: 77  (11/26 0559)  Labs:  Basename 03/17/12 0500 03/16/12 0500 03/15/12 0850  HGB 9.3* -- --  HCT 28.2* -- --  PLT 289 -- --  APTT -- -- --  LABPROT 19.1* 21.0* 24.8*  INR 1.66* 1.89* 2.37*  HEPARINUNFRC -- -- --  CREATININE -- -- --  CKTOTAL -- -- --  CKMB -- -- --  TROPONINI -- -- --    Estimated Creatinine Clearance: 95.2 ml/min (by C-G formula based on Cr of 1.07).   Medications:  Scheduled:     . Chlorhexidine Gluconate Cloth  6 each Topical Q0600  . feeding supplement  30 mL Oral Daily  . fentaNYL  50 mcg Transdermal Q72H  . furosemide  40 mg Oral Daily  . gabapentin  800 mg Oral QHS  . metFORMIN  500 mg Oral Daily  . mometasone-formoterol  2 puff Inhalation BID  . montelukast  10 mg Oral QHS  . mupirocin ointment  1 application Nasal BID  . nutrition supplement  1 packet Oral BID BM  . pantoprazole  20 mg Oral Daily  . potassium chloride SA  20 mEq Oral Daily  . simvastatin  5 mg Oral q1800  . tiotropium  18 mcg Inhalation Daily  . topiramate  100 mg Oral QHS  . topiramate  50 mg Oral BID WC  . [COMPLETED] warfarin  5 mg Oral ONCE-1800  . Warfarin - Pharmacist Dosing Inpatient   Does not apply q1800  . [DISCONTINUED] fentaNYL  50 mcg Transdermal Q72H    Assessment: Today's INR is 1.66, down from 1.89 yesterday in this 61 y/o male with h/o DVT.  POD#4 s/p I&D with skin graft for chronic leg wound.  The patient was on coumadin 5 mg daily PTA for h/o DVT.  Prior to admit he was unaware of whether or not to hold warfarin apparently and took his last dose Wed. 11/20 and his admit INR was 5.2 likely due to interaction with doxycycline. No coumadin given 11/21, 11/22 and 11/23  due to supratherapeutic INR. Coumadin resumed on 11/24.  H/H 9.3/28.2,  pltc 289K. No bleeding noted.  UOP 1200 ml yesterday.    No current antibiotics. Received Ancef preop and x 2 doses post op.  WBC 6.6K,  Afebrile. The patient's discharge med list indicates  Ciprofloxacin to be considered upon discharge.  Please note Cipro can increase coumadin effect. Patient will need close INR/coumadin follow up if dc'd on cipro as previously his INR jumped to 5.2 while on doxycycline prior to admission.   Warfarin predictor point score = 6  Goal of Therapy:  INR 2-3 Monitor platelets by anticoagulation protocol: Yes   Plan:  - Warfarin 7.5mg  x 1 at 1800 - Daily PT/INR - Monitor for bleeding. CBC in AM  [name], RPh Clinical Pharmacist Pager: [phone] 03/17/2012 11:16 AM

## 2012-03-17 NOTE — Progress Notes (Signed)
Physical Therapy Treatment Patient Details Name: Jeremy [name] MRN: [id number] DOB: 11-Oct-1950 Today's Date: 03/17/2012 Time: 4098-1191 PT Time Calculation (min): 24 min  PT Assessment / Plan / Recommendation Comments on Treatment Session  Patient agreeable to OOB to chair for lunch but no hall ambulation. Patient educated on importance of mobility prior to dishcarge home. Patient requires some encouragement but pleasent today. Will plan on seeing again tomorrow as there is a note for posisible DC    Follow Up Recommendations  Home health PT;Supervision/Assistance - 24 hour     Does the patient have the potential to tolerate intense rehabilitation     Barriers to Discharge        Equipment Recommendations  Rolling walker with 5" wheels    Recommendations for Other Services    Frequency Min 5X/week   Plan Discharge plan remains appropriate;Frequency remains appropriate    Precautions / Restrictions Precautions Precautions: Fall Precaution Comments: would vac Restrictions LLE Weight Bearing: Weight bearing as tolerated   Pertinent Vitals/Pain     Mobility  Bed Mobility Supine to Sit: 6: Modified independent (Device/Increase time) Transfers Sit to Stand: 5: Supervision;With upper extremity assist;From bed Stand to Sit: 5: Supervision;With upper extremity assist;To chair/3-in-1 Details for Transfer Assistance: Cues for safe hand placement Ambulation/Gait Ambulation/Gait Assistance: 4: Min guard Ambulation Distance (Feet): 15 Feet Assistive device: Rolling walker Ambulation/Gait Assistance Details: Cues for safety with RW management Gait Pattern: Step-to pattern;Trunk flexed    Exercises     PT Diagnosis:    PT Problem List:   PT Treatment Interventions:     PT Goals Acute Rehab PT Goals PT Goal: Supine/Side to Sit - Progress: Met PT Goal: Sit to Stand - Progress: Progressing toward goal PT Transfer Goal: Bed to Chair/Chair to Bed - Progress: Progressing toward  goal PT Goal: Ambulate - Progress: Progressing toward goal  Visit Information  Last PT Received On: 03/17/12    Subjective Data      Cognition  Overall Cognitive Status: Appears within functional limits for tasks assessed/performed Arousal/Alertness: Awake/alert Orientation Level: Appears intact for tasks assessed Behavior During Session: [facility] for tasks performed    Balance     End of Session PT - End of Session Equipment Utilized During Treatment: Gait belt Activity Tolerance: Patient tolerated treatment well Patient left: in chair;with call bell/phone within reach Nurse Communication: Mobility status   GP     Fredrich Birks 03/17/2012, 12:08 PM 03/17/2012 Fredrich Birks PTA [phone] pager [phone] office

## 2012-03-17 NOTE — Progress Notes (Signed)
Patient ID: Jeremy [name], male   DOB: [birth year], 61 y.o.   MRN: [id number] Plan for removal of wound VAC tomorrow and discharged to home. Patient still has good drainage from the wound VAC. Wound VAC to be changed on postoperative day 5 to maintain maximum integrity of the split thickness skin graft.

## 2012-03-18 LAB — GLUCOSE, CAPILLARY: Glucose-Capillary: 100 mg/dL — ABNORMAL HIGH (ref 70–99)

## 2012-03-18 LAB — PROTIME-INR
INR: 1.72 — ABNORMAL HIGH (ref 0.00–1.49)
Prothrombin Time: 19.6 seconds — ABNORMAL HIGH (ref 11.6–15.2)

## 2012-03-18 NOTE — Progress Notes (Signed)
Per phone conversation with Dr. Lajoyce Corners, the patient was not to continue cipro at home.

## 2012-03-18 NOTE — Progress Notes (Signed)
CARE MANAGEMENT NOTE 03/18/2012  Patient:  Jeremy [name], Jeremy [name]   Account Number:  [id number]  Date Initiated:  03/16/2012  Documentation initiated by:  Vance Peper  Subjective/Objective Assessment:   61 yr old male admitted with chronic left leg ulcers.Has wound vac.     Action/Plan:   CM spoke with patient and wife concerning home health and DME needs at discharge. The have used  [facility] in the past. CM Will wait to confirm [facility] needs.11/27/13Patient has no HH needs. Vac discontinued.   Anticipated DC Date:  03/18/2012   Anticipated DC Plan:  HOME/SELF CARE      DC Planning Services  CM consult      Choice offered to / List presented to:             Status of service:  Completed, signed off Medicare Important Message given?   (If response is "NO", the following Medicare IM given date fields will be blank) Date Medicare IM given:   Date Additional Medicare IM given:    Discharge Disposition:  HOME/SELF CARE

## 2012-03-18 NOTE — Progress Notes (Signed)
ANTICOAGULATION CONSULT NOTE - Follow Up Consult  Pharmacy Consult for Warfarin Indication:history of DVT on chronic coumadin ; VTE prophylaxis  No Known Allergies    Vital Signs: Temp: 98.5 F (36.9 C) (11/27 0600) Temp src: Oral (11/26 2242) BP: 116/55 mmHg (11/27 0600) Pulse Rate: 74  (11/27 0600)  Labs:  Basename 03/18/12 0640 03/17/12 0500 03/16/12 0500  HGB -- 9.3* --  HCT -- 28.2* --  PLT -- 289 --  APTT -- -- --  LABPROT 19.6* 19.1* 21.0*  INR 1.72* 1.66* 1.89*  HEPARINUNFRC -- -- --  CREATININE -- -- --  CKTOTAL -- -- --  CKMB -- -- --  TROPONINI -- -- --    Estimated Creatinine Clearance: 95.2 ml/min (by C-G formula based on Cr of 1.07).   Medications:  Scheduled:     . Chlorhexidine Gluconate Cloth  6 each Topical Q0600  . feeding supplement  30 mL Oral Daily  . fentaNYL  50 mcg Transdermal Q72H  . furosemide  40 mg Oral Daily  . gabapentin  800 mg Oral QHS  . metFORMIN  500 mg Oral Daily  . mometasone-formoterol  2 puff Inhalation BID  . montelukast  10 mg Oral QHS  . mupirocin ointment  1 application Nasal BID  . nutrition supplement  1 packet Oral BID BM  . pantoprazole  20 mg Oral Daily  . potassium chloride SA  20 mEq Oral Daily  . simvastatin  5 mg Oral q1800  . tiotropium  18 mcg Inhalation Daily  . topiramate  100 mg Oral QHS  . topiramate  50 mg Oral BID WC  . [COMPLETED] warfarin  7.5 mg Oral ONCE-1800  . Warfarin - Pharmacist Dosing Inpatient   Does not apply q1800    Assessment: INR is 1.72 today in this 61 y/o male with h/o DVT on coumadin PTA. POD#5 s/p I&D with skin graft for chronic leg wound.  No bleeding reported.  Dr. Lajoyce Corners is discharging patient today on his PTA dose of Coumadin 5mg  daily which is appropriate dose.  I called Dr. Lajoyce Corners about doxycycline on discharge med list as patient's wife said Dr. Lajoyce Corners told her this AM that patient did not need antibiotics and I have concern with doxycyline since admit INR was 5.2 possibly  due to doxycycline PTA.  Dr. Lajoyce Corners instructed me to discontinue the doxycyline from the DC med list.  I have reviewed coumadin education points with the patient and his wife.  I instructed wife to call Dr. Marzetta Board office today to get appointment for INR check for early next week. They voiced understanding of importance of INR check.      Warfarin predictor point score = 6  Goal of Therapy:  INR 2-3 Monitor platelets by anticoagulation protocol: Yes   Plan:  DC on  Warfarin 5mg  daily 7.5mg  x 1 at 1800 PT/INR per Dr. Arlyce Dice as on PTA, - wife to call Dr. Marzetta Board office today to get appointment for INR check for early next week.   Jeremy [name], RPh Clinical Pharmacist Pager: [phone] 03/18/2012 10:20 AM

## 2012-03-18 NOTE — Progress Notes (Signed)
Physical Therapy Treatment Patient Details Name: KAIDEN PECH MRN: [id number] DOB: Jun 29, 1950 Today's Date: 03/18/2012 Time: 4098-1191 PT Time Calculation (min): 51 min  PT Assessment / Plan / Recommendation Comments on Treatment Session  Patient progressing well after having wound vac taken off this morning. Patient eager to retrun home with wife. Planning to DC this morning.    Follow Up Recommendations  Home health PT;Supervision/Assistance - 24 hour     Does the patient have the potential to tolerate intense rehabilitation     Barriers to Discharge        Equipment Recommendations  Rolling walker with 5" wheels    Recommendations for Other Services    Frequency Min 5X/week   Plan Discharge plan remains appropriate;Frequency remains appropriate    Precautions / Restrictions Precautions Precautions: Fall Precaution Comments: wound vac Restrictions LLE Weight Bearing: Weight bearing as tolerated   Pertinent Vitals/Pain     Mobility  Bed Mobility Supine to Sit: 6: Modified independent (Device/Increase time) Transfers Sit to Stand: 6: Modified independent (Device/Increase time) Stand to Sit: 6: Modified independent (Device/Increase time) Ambulation/Gait Ambulation/Gait Assistance: 5: Supervision Ambulation Distance (Feet): 120 Feet Assistive device: Rolling walker Ambulation/Gait Assistance Details: Progressing well with ambulation. Cues for safety with RW management and positioning Gait Pattern: Step-through pattern;Decreased stride length;Trunk flexed Gait velocity: decreased    Exercises     PT Diagnosis:    PT Problem List:   PT Treatment Interventions:     PT Goals Acute Rehab PT Goals PT Goal: Supine/Side to Sit - Progress: Met PT Goal: Sit to Stand - Progress: Met PT Transfer Goal: Bed to Chair/Chair to Bed - Progress: Met PT Goal: Ambulate - Progress: Progressing toward goal  Visit Information  Last PT Received On: 03/18/12 Assistance Needed: +1   Subjective Data      Cognition  Overall Cognitive Status: Appears within functional limits for tasks assessed/performed Arousal/Alertness: Awake/alert Orientation Level: Appears intact for tasks assessed Behavior During Session: [facility] for tasks performed    Balance     End of Session PT - End of Session Equipment Utilized During Treatment: Gait belt Activity Tolerance: Patient tolerated treatment well Patient left: in bed;with call bell/phone within reach Nurse Communication: Mobility status   GP     Fredrich Birks 03/18/2012, 10:56 AM  03/18/2012 Fredrich Birks PTA [phone] pager [phone] office

## 2012-03-18 NOTE — Discharge Summary (Signed)
Physician Discharge Summary  Patient ID: Jeremy [name] MRN: [id number] DOB/AGE: 1950-10-17 61 y.o.  Admit date: 03/13/2012 Discharge date: 03/18/2012  Admission Diagnoses: Chronic ulceration left leg  Discharge Diagnoses: Same Active Problems:  * No active hospital problems. *    Discharged Condition: stable  Hospital Course: Patient's hospital course was essentially unremarkable. He underwent a left split thickness skin graft to mass of ulceration to the left leg. Postoperatively patient required 5 days of a wound VAC therapy. The wound VAC was removed today the split thickness skin graft is stable and intact his donor site is stable as well patient will be discharged to home in stable condition he does have prescription for pain medicine at home and needs no additional prescriptions  Consults: None  Significant Diagnostic Studies: labs: Routine labs  Treatments: surgery: See operative note  Discharge Exam: Blood pressure 116/55, pulse 74, temperature 98.5 F (36.9 C), temperature source Oral, resp. rate 18, height 5\' 11"  (1.803 m), weight 116.3 kg (256 lb 6.3 oz), SpO2 100.00%. Incision/Wound: wounds clean dry and intact.  Disposition: 01-Home or Self Care  Discharge Orders    Future Orders Please Complete By Expires   Diet - low sodium heart healthy      Call MD / Call 911      Comments:   If you experience chest pain or shortness of breath, CALL 911 and be transported to the hospital emergency room.  If you develope a fever above 101 F, pus (white drainage) or increased drainage or redness at the wound, or calf pain, call your surgeon's office.   Constipation Prevention      Comments:   Drink plenty of fluids.  Prune juice may be helpful.  You may use a stool softener, such as Colace (over the counter) 100 mg twice a day.  Use MiraLax (over the counter) for constipation as needed.   Increase activity slowly as tolerated          Medication List     As of 03/18/2012   7:26 AM    TAKE these medications         albuterol (2.5 MG/3ML) 0.083% nebulizer solution   Commonly known as: PROVENTIL   Take 2.5 mg by nebulization every 6 (six) hours as needed. For shortness of breath      clonazePAM 0.5 MG tablet   Commonly known as: KLONOPIN   Take 0.5 mg by mouth 2 (two) times daily as needed. For anxiety      doxycycline 100 MG tablet   Commonly known as: ADOXA   Take 100 mg by mouth 2 (two) times daily.      fentaNYL 50 MCG/HR   Commonly known as: DURAGESIC - dosed mcg/hr   Place 1 patch onto the skin every 3 (three) days.      ferrous sulfate 325 (65 FE) MG tablet   Take 325 mg by mouth daily with breakfast.      Fluticasone-Salmeterol 500-50 MCG/DOSE Aepb   Commonly known as: ADVAIR   Inhale 1 puff into the lungs every 12 (twelve) hours.      furosemide 40 MG tablet   Commonly known as: LASIX   Take 40 mg by mouth daily.      gabapentin 800 MG tablet   Commonly known as: NEURONTIN   Take 800 mg by mouth at bedtime.      metFORMIN 500 MG tablet   Commonly known as: GLUCOPHAGE   Take 500 mg by mouth daily.  montelukast 10 MG tablet   Commonly known as: SINGULAIR   Take 10 mg by mouth at bedtime.      oxyCODONE-acetaminophen 10-325 MG per tablet   Commonly known as: PERCOCET   Take 1 tablet by mouth daily.      pantoprazole 20 MG tablet   Commonly known as: PROTONIX   Take 20 mg by mouth daily.      potassium chloride SA 20 MEQ tablet   Commonly known as: K-DUR,KLOR-CON   Take 20 mEq by mouth daily.      pravastatin 10 MG tablet   Commonly known as: PRAVACHOL   Take 10 mg by mouth daily.      tiotropium 18 MCG inhalation capsule   Commonly known as: SPIRIVA   Place 18 mcg into inhaler and inhale daily.      topiramate 50 MG tablet   Commonly known as: TOPAMAX   Take 50-100 mg by mouth 3 (three) times daily. 50 mg in am, 50 mg at noon and 100 mg at bedtime      Vitamin D (Ergocalciferol) 50000 UNITS Caps   Commonly  known as: DRISDOL   Take 50,000 Units by mouth every 7 (seven) days.      warfarin 5 MG tablet   Commonly known as: COUMADIN   Take 5 mg by mouth daily.      XOPENEX HFA 45 MCG/ACT inhaler   Generic drug: levalbuterol   Inhale 1-2 puffs into the lungs every 4 (four) hours as needed. For shortness of breath      ASK your doctor about these medications         ciprofloxacin 500 MG tablet   Commonly known as: CIPRO   Take 500 mg by mouth 2 (two) times daily.           Follow-up Information    Follow up with Shaylynne Lunt V, MD. In 4 days. (monday f/u)    Contact information:   [address] [place] [address] [phone]          Signed: Nadara Mustard 03/18/2012, 7:26 AM

## 2012-07-27 ENCOUNTER — Encounter (HOSPITAL_COMMUNITY): Payer: Self-pay | Admitting: Pharmacy Technician

## 2012-07-27 ENCOUNTER — Other Ambulatory Visit (HOSPITAL_COMMUNITY): Payer: Self-pay | Admitting: Orthopedic Surgery

## 2012-07-29 ENCOUNTER — Encounter (HOSPITAL_COMMUNITY): Payer: Self-pay

## 2012-07-29 ENCOUNTER — Encounter (HOSPITAL_COMMUNITY)
Admission: RE | Admit: 2012-07-29 | Discharge: 2012-07-29 | Disposition: A | Discharge status: Home / Self Care | Payer: Medicare Other | Source: Ambulatory Visit | Attending: Orthopedic Surgery | Admitting: Orthopedic Surgery

## 2012-07-29 ENCOUNTER — Ambulatory Visit (HOSPITAL_COMMUNITY)
Admission: RE | Admit: 2012-07-29 | Discharge: 2012-07-29 | Disposition: A | Discharge status: Home / Self Care | Payer: Medicare Other | Source: Ambulatory Visit | Attending: Orthopedic Surgery | Admitting: Orthopedic Surgery

## 2012-07-29 DIAGNOSIS — J4489 Other specified chronic obstructive pulmonary disease: Secondary | ICD-10-CM | POA: Insufficient documentation

## 2012-07-29 DIAGNOSIS — Z87891 Personal history of nicotine dependence: Secondary | ICD-10-CM | POA: Insufficient documentation

## 2012-07-29 DIAGNOSIS — E119 Type 2 diabetes mellitus without complications: Secondary | ICD-10-CM | POA: Insufficient documentation

## 2012-07-29 DIAGNOSIS — Z01818 Encounter for other preprocedural examination: Secondary | ICD-10-CM | POA: Insufficient documentation

## 2012-07-29 DIAGNOSIS — I10 Essential (primary) hypertension: Secondary | ICD-10-CM | POA: Insufficient documentation

## 2012-07-29 DIAGNOSIS — R0602 Shortness of breath: Secondary | ICD-10-CM | POA: Insufficient documentation

## 2012-07-29 DIAGNOSIS — I517 Cardiomegaly: Secondary | ICD-10-CM | POA: Insufficient documentation

## 2012-07-29 DIAGNOSIS — J449 Chronic obstructive pulmonary disease, unspecified: Secondary | ICD-10-CM | POA: Insufficient documentation

## 2012-07-29 LAB — COMPREHENSIVE METABOLIC PANEL
Alkaline Phosphatase: 109 U/L (ref 39–117)
BUN: 17 mg/dL (ref 6–23)
CO2: 26 mEq/L (ref 19–32)
GFR calc Af Amer: >90 mL/min (ref >90)
GFR calc non Af Amer: 90 mL/min — ABNORMAL LOW (ref >90)
Glucose, Bld: 109 mg/dL — ABNORMAL HIGH (ref 70–99)
Potassium: 3.8 mEq/L (ref 3.5–5.1)
Total Bilirubin: 0.3 mg/dL (ref 0.3–1.2)
Total Protein: 6.9 g/dL (ref 6.0–8.3)

## 2012-07-29 LAB — PROTIME-INR: Prothrombin Time: 16.8 seconds — ABNORMAL HIGH (ref 11.6–15.2)

## 2012-07-29 LAB — CBC
HCT: 34.9 % — ABNORMAL LOW (ref 39.0–52.0)
Hemoglobin: 11.7 g/dL — ABNORMAL LOW (ref 13.0–17.0)
MCH: 30 pg (ref 26.0–34.0)
MCHC: 33.5 g/dL (ref 30.0–36.0)

## 2012-07-29 LAB — APTT: aPTT: 37 seconds (ref 24–37)

## 2012-07-29 NOTE — Progress Notes (Signed)
Requested EKG, Echo from [facility]. Requested Sleep Study Dr. Egbert Garibaldi in [place]. Patient has back stimulator copy of card placed in chart. Patient has control device with him

## 2012-07-29 NOTE — Pre-Procedure Instructions (Signed)
BEAR OSTEN  07/29/2012   Your procedure is scheduled on:  April 11  Report to [facility] at 10:45 AM.  Call this number if you have problems the morning of surgery: [phone]   Remember:   Do not eat food or drink liquids after midnight.   Take these medicines the morning of surgery with A SIP OF WATER: Albuterol (if needed), Cymbalta, Neurontin, Xopenex (if needed), Advair, Oxycodone (if needed), Spiriva, Topiramate     Stop Celebrex today  Do not wear jewelry, make-up or nail polish.  Do not wear lotions, powders, or perfumes. You may wear deodorant.  Do not shave 48 hours prior to surgery. Men may shave face and neck.  Do not bring valuables to the hospital.  Contacts, dentures or bridgework may not be worn into surgery.  Leave suitcase in the car. After surgery it may be brought to your room.  For patients admitted to the hospital, checkout time is 11:00 AM the day of discharge.   Special Instructions: Shower using CHG 2 nights before surgery and the night before surgery.  If you shower the day of surgery use CHG.  Use special wash - you have one bottle of CHG for all showers.  You should use approximately 1/3 of the bottle for each shower.   Please read over the following fact sheets that you were given: Pain Booklet, Coughing and Deep Breathing and Surgical Site Infection Prevention

## 2012-07-30 MED ORDER — VANCOMYCIN HCL 10 G IV SOLR
1500.0000 mg | INTRAVENOUS | Status: AC
  Administered 2012-07-31: 1500 mg via INTRAVENOUS
  Filled 2012-07-30: qty 1500

## 2012-07-30 NOTE — Progress Notes (Signed)
Nurse left message on VM requesting that nasal ointment be purchased and applied twice a day for 5 days. Direct call back number to short stay left.

## 2012-07-31 ENCOUNTER — Encounter (HOSPITAL_COMMUNITY)
Admission: RE | Disposition: A | Discharge status: Home Health | Payer: Self-pay | Source: Ambulatory Visit | Attending: Orthopedic Surgery

## 2012-07-31 ENCOUNTER — Encounter (HOSPITAL_COMMUNITY): Payer: Self-pay | Admitting: *Deleted

## 2012-07-31 ENCOUNTER — Inpatient Hospital Stay (HOSPITAL_COMMUNITY): Payer: Medicare Other | Admitting: Anesthesiology

## 2012-07-31 ENCOUNTER — Encounter (HOSPITAL_COMMUNITY): Payer: Self-pay | Admitting: Vascular Surgery

## 2012-07-31 ENCOUNTER — Inpatient Hospital Stay (HOSPITAL_COMMUNITY)
Admission: RE | Admit: 2012-07-31 | Discharge: 2012-08-03 | DRG: 264 | Disposition: A | Discharge status: Home Health | Payer: Medicare Other | Source: Ambulatory Visit | Attending: Orthopedic Surgery | Admitting: Orthopedic Surgery

## 2012-07-31 DIAGNOSIS — J449 Chronic obstructive pulmonary disease, unspecified: Secondary | ICD-10-CM | POA: Diagnosis present

## 2012-07-31 DIAGNOSIS — Z87891 Personal history of nicotine dependence: Secondary | ICD-10-CM

## 2012-07-31 DIAGNOSIS — D649 Anemia, unspecified: Secondary | ICD-10-CM | POA: Diagnosis present

## 2012-07-31 DIAGNOSIS — I872 Venous insufficiency (chronic) (peripheral): Principal | ICD-10-CM | POA: Diagnosis present

## 2012-07-31 DIAGNOSIS — L408 Other psoriasis: Secondary | ICD-10-CM | POA: Diagnosis present

## 2012-07-31 DIAGNOSIS — I83221 Varicose veins of left lower extremity with both ulcer of thigh and inflammation: Secondary | ICD-10-CM

## 2012-07-31 DIAGNOSIS — I739 Peripheral vascular disease, unspecified: Secondary | ICD-10-CM | POA: Diagnosis present

## 2012-07-31 DIAGNOSIS — L03116 Cellulitis of left lower limb: Secondary | ICD-10-CM

## 2012-07-31 DIAGNOSIS — Z9884 Bariatric surgery status: Secondary | ICD-10-CM

## 2012-07-31 DIAGNOSIS — L97909 Non-pressure chronic ulcer of unspecified part of unspecified lower leg with unspecified severity: Secondary | ICD-10-CM | POA: Diagnosis present

## 2012-07-31 DIAGNOSIS — I509 Heart failure, unspecified: Secondary | ICD-10-CM | POA: Diagnosis present

## 2012-07-31 DIAGNOSIS — Z6841 Body Mass Index (BMI) 40.0 and over, adult: Secondary | ICD-10-CM

## 2012-07-31 DIAGNOSIS — K219 Gastro-esophageal reflux disease without esophagitis: Secondary | ICD-10-CM | POA: Diagnosis present

## 2012-07-31 DIAGNOSIS — E119 Type 2 diabetes mellitus without complications: Secondary | ICD-10-CM | POA: Diagnosis present

## 2012-07-31 DIAGNOSIS — Z85828 Personal history of other malignant neoplasm of skin: Secondary | ICD-10-CM

## 2012-07-31 DIAGNOSIS — J4489 Other specified chronic obstructive pulmonary disease: Secondary | ICD-10-CM | POA: Diagnosis present

## 2012-07-31 DIAGNOSIS — G4730 Sleep apnea, unspecified: Secondary | ICD-10-CM | POA: Diagnosis present

## 2012-07-31 DIAGNOSIS — G609 Hereditary and idiopathic neuropathy, unspecified: Secondary | ICD-10-CM | POA: Diagnosis present

## 2012-07-31 DIAGNOSIS — Z86718 Personal history of other venous thrombosis and embolism: Secondary | ICD-10-CM

## 2012-07-31 HISTORY — PX: APPLICATION OF WOUND VAC: SHX5189

## 2012-07-31 HISTORY — PX: APLIGRAFT PLACEMENT: SHX5228

## 2012-07-31 LAB — PROTIME-INR
INR: 1.14 (ref 0.00–1.49)
Prothrombin Time: 14.4 seconds (ref 11.6–15.2)

## 2012-07-31 LAB — GLUCOSE, CAPILLARY: Glucose-Capillary: 115 mg/dL — ABNORMAL HIGH (ref 70–99)

## 2012-07-31 SURGERY — MINOR APLIGRAFT APLICATION
Anesthesia: General | Site: Leg Lower | Laterality: Left | Wound class: Dirty or Infected

## 2012-07-31 MED ORDER — MEPERIDINE HCL 25 MG/ML IJ SOLN: 6.2500 mg | INTRAMUSCULAR | Status: DC | PRN

## 2012-07-31 MED ORDER — ONDANSETRON HCL 4 MG/2ML IJ SOLN
INTRAMUSCULAR | Status: DC | PRN
  Administered 2012-07-31: 4 mg via INTRAVENOUS

## 2012-07-31 MED ORDER — DULOXETINE HCL 30 MG PO CPEP
30.0000 mg | ORAL_CAPSULE | Freq: Every day | ORAL | Status: DC
  Administered 2012-07-31 – 2012-08-03 (×4): 30 mg via ORAL
  Filled 2012-07-31 (×4): qty 1

## 2012-07-31 MED ORDER — FERROUS SULFATE 325 (65 FE) MG PO TABS
325.0000 mg | ORAL_TABLET | Freq: Three times a day (TID) | ORAL | Status: DC
  Administered 2012-08-01 – 2012-08-03 (×6): 325 mg via ORAL
  Filled 2012-07-31 (×10): qty 1

## 2012-07-31 MED ORDER — MIDAZOLAM HCL 5 MG/5ML IJ SOLN
INTRAMUSCULAR | Status: DC | PRN
  Administered 2012-07-31 (×2): 1 mg via INTRAVENOUS

## 2012-07-31 MED ORDER — METOCLOPRAMIDE HCL 10 MG PO TABS: 5.0000 mg | ORAL_TABLET | Freq: Three times a day (TID) | ORAL | Status: DC | PRN

## 2012-07-31 MED ORDER — PROMETHAZINE HCL 25 MG/ML IJ SOLN: 6.2500 mg | INTRAMUSCULAR | Status: DC | PRN

## 2012-07-31 MED ORDER — VANCOMYCIN HCL IN DEXTROSE 1-5 GM/200ML-% IV SOLN
1000.0000 mg | Freq: Two times a day (BID) | INTRAVENOUS | Status: AC
  Administered 2012-07-31: 1000 mg via INTRAVENOUS
  Filled 2012-07-31: qty 200

## 2012-07-31 MED ORDER — OXYCODONE HCL 5 MG PO TABS
5.0000 mg | ORAL_TABLET | ORAL | Status: DC | PRN
  Administered 2012-08-01 – 2012-08-03 (×8): 10 mg via ORAL
  Filled 2012-07-31 (×8): qty 2

## 2012-07-31 MED ORDER — TOPIRAMATE 25 MG PO TABS
50.0000 mg | ORAL_TABLET | Freq: Two times a day (BID) | ORAL | Status: DC
  Administered 2012-07-31 – 2012-08-03 (×6): 50 mg via ORAL
  Filled 2012-07-31 (×8): qty 2

## 2012-07-31 MED ORDER — WARFARIN SODIUM 7.5 MG PO TABS
7.5000 mg | ORAL_TABLET | Freq: Once | ORAL | Status: AC
  Administered 2012-07-31: 7.5 mg via ORAL
  Filled 2012-07-31: qty 1

## 2012-07-31 MED ORDER — HYDROMORPHONE HCL PF 1 MG/ML IJ SOLN
0.5000 mg | INTRAMUSCULAR | Status: DC | PRN
  Administered 2012-07-31 – 2012-08-03 (×6): 1 mg via INTRAVENOUS
  Filled 2012-07-31 (×6): qty 1

## 2012-07-31 MED ORDER — WARFARIN - PHARMACIST DOSING INPATIENT
Freq: Every day | Status: DC
  Administered 2012-08-01: 17:00:00

## 2012-07-31 MED ORDER — ALBUTEROL SULFATE (5 MG/ML) 0.5% IN NEBU: 2.5000 mg | INHALATION_SOLUTION | Freq: Four times a day (QID) | RESPIRATORY_TRACT | Status: DC | PRN

## 2012-07-31 MED ORDER — SODIUM CHLORIDE 0.9 % IV SOLN: INTRAVENOUS | Status: DC

## 2012-07-31 MED ORDER — SODIUM CHLORIDE 0.9 % IR SOLN
Status: DC | PRN
  Administered 2012-07-31: 1000 mL

## 2012-07-31 MED ORDER — OXYCODONE HCL 5 MG PO TABS: 5.0000 mg | ORAL_TABLET | Freq: Once | ORAL | Status: DC | PRN

## 2012-07-31 MED ORDER — LEVALBUTEROL TARTRATE 45 MCG/ACT IN AERO
1.0000 | INHALATION_SPRAY | Freq: Four times a day (QID) | RESPIRATORY_TRACT | Status: DC | PRN
  Filled 2012-07-31: qty 15

## 2012-07-31 MED ORDER — FUROSEMIDE 40 MG PO TABS
40.0000 mg | ORAL_TABLET | Freq: Every day | ORAL | Status: DC
  Administered 2012-07-31 – 2012-08-02 (×3): 40 mg via ORAL
  Filled 2012-07-31 (×4): qty 1

## 2012-07-31 MED ORDER — ARTIFICIAL TEARS OP OINT
TOPICAL_OINTMENT | OPHTHALMIC | Status: DC | PRN
  Administered 2012-07-31: 1 via OPHTHALMIC

## 2012-07-31 MED ORDER — LACTATED RINGERS IV SOLN
INTRAVENOUS | Status: DC
  Administered 2012-07-31: 13:00:00 via INTRAVENOUS

## 2012-07-31 MED ORDER — SIMVASTATIN 5 MG PO TABS
5.0000 mg | ORAL_TABLET | Freq: Every day | ORAL | Status: DC
  Administered 2012-07-31 – 2012-08-02 (×3): 5 mg via ORAL
  Filled 2012-07-31 (×4): qty 1

## 2012-07-31 MED ORDER — FENTANYL CITRATE 0.05 MG/ML IJ SOLN
INTRAMUSCULAR | Status: AC
  Filled 2012-07-31: qty 2

## 2012-07-31 MED ORDER — MIDAZOLAM HCL 2 MG/2ML IJ SOLN: 0.5000 mg | Freq: Once | INTRAMUSCULAR | Status: DC | PRN

## 2012-07-31 MED ORDER — FENTANYL CITRATE 0.05 MG/ML IJ SOLN
25.0000 ug | INTRAMUSCULAR | Status: DC | PRN
  Administered 2012-07-31 (×2): 25 ug via INTRAVENOUS
  Administered 2012-07-31: 50 ug via INTRAVENOUS

## 2012-07-31 MED ORDER — CELECOXIB 200 MG PO CAPS
200.0000 mg | ORAL_CAPSULE | Freq: Every day | ORAL | Status: DC
  Administered 2012-07-31 – 2012-08-03 (×4): 200 mg via ORAL
  Filled 2012-07-31 (×4): qty 1

## 2012-07-31 MED ORDER — HYDROCODONE-ACETAMINOPHEN 10-325 MG PO TABS
1.0000 | ORAL_TABLET | ORAL | Status: DC | PRN
  Administered 2012-08-02 (×2): 2 via ORAL
  Filled 2012-07-31 (×2): qty 2

## 2012-07-31 MED ORDER — MOMETASONE FURO-FORMOTEROL FUM 200-5 MCG/ACT IN AERO
2.0000 | INHALATION_SPRAY | Freq: Two times a day (BID) | RESPIRATORY_TRACT | Status: DC
  Administered 2012-08-01 – 2012-08-03 (×5): 2 via RESPIRATORY_TRACT
  Filled 2012-07-31 (×2): qty 8.8

## 2012-07-31 MED ORDER — PROPOFOL 10 MG/ML IV BOLUS
INTRAVENOUS | Status: DC | PRN
  Administered 2012-07-31: 200 mg via INTRAVENOUS

## 2012-07-31 MED ORDER — TIOTROPIUM BROMIDE MONOHYDRATE 18 MCG IN CAPS
18.0000 ug | ORAL_CAPSULE | Freq: Every day | RESPIRATORY_TRACT | Status: DC
  Administered 2012-08-01 – 2012-08-03 (×3): 18 ug via RESPIRATORY_TRACT
  Filled 2012-07-31 (×2): qty 5

## 2012-07-31 MED ORDER — POTASSIUM CHLORIDE CRYS ER 20 MEQ PO TBCR
20.0000 meq | EXTENDED_RELEASE_TABLET | Freq: Every day | ORAL | Status: DC
  Administered 2012-07-31 – 2012-08-03 (×4): 20 meq via ORAL
  Filled 2012-07-31 (×5): qty 1

## 2012-07-31 MED ORDER — MONTELUKAST SODIUM 10 MG PO TABS
10.0000 mg | ORAL_TABLET | Freq: Every day | ORAL | Status: DC
  Administered 2012-07-31 – 2012-08-02 (×3): 10 mg via ORAL
  Filled 2012-07-31 (×4): qty 1

## 2012-07-31 MED ORDER — FENTANYL CITRATE 0.05 MG/ML IJ SOLN
INTRAMUSCULAR | Status: DC | PRN
  Administered 2012-07-31 (×3): 50 ug via INTRAVENOUS

## 2012-07-31 MED ORDER — ONDANSETRON HCL 4 MG/2ML IJ SOLN: 4.0000 mg | Freq: Four times a day (QID) | INTRAMUSCULAR | Status: DC | PRN

## 2012-07-31 MED ORDER — GABAPENTIN 400 MG PO CAPS
800.0000 mg | ORAL_CAPSULE | Freq: Every day | ORAL | Status: DC
  Administered 2012-07-31 – 2012-08-02 (×3): 800 mg via ORAL
  Filled 2012-07-31 (×4): qty 2

## 2012-07-31 MED ORDER — SODIUM CHLORIDE 0.9 % IJ SOLN
10.0000 mL | INTRAMUSCULAR | Status: DC | PRN
  Administered 2012-08-01: 10 mL

## 2012-07-31 MED ORDER — WARFARIN SODIUM 5 MG PO TABS
5.0000 mg | ORAL_TABLET | Freq: Every day | ORAL | Status: DC
  Filled 2012-07-31: qty 1

## 2012-07-31 MED ORDER — OXYCODONE HCL 5 MG PO TABS
ORAL_TABLET | ORAL | Status: AC
  Administered 2012-07-31: 5 mg
  Filled 2012-07-31: qty 1

## 2012-07-31 MED ORDER — METOCLOPRAMIDE HCL 5 MG/ML IJ SOLN: 5.0000 mg | Freq: Three times a day (TID) | INTRAMUSCULAR | Status: DC | PRN

## 2012-07-31 MED ORDER — WARFARIN - PHYSICIAN DOSING INPATIENT: Freq: Every day | Status: DC

## 2012-07-31 MED ORDER — ONDANSETRON HCL 4 MG PO TABS: 4.0000 mg | ORAL_TABLET | Freq: Four times a day (QID) | ORAL | Status: DC | PRN

## 2012-07-31 MED ORDER — LACTATED RINGERS IV SOLN
INTRAVENOUS | Status: DC | PRN
  Administered 2012-07-31: 13:00:00 via INTRAVENOUS

## 2012-07-31 MED ORDER — GABAPENTIN 800 MG PO TABS
800.0000 mg | ORAL_TABLET | Freq: Every day | ORAL | Status: DC
  Filled 2012-07-31: qty 1

## 2012-07-31 MED ORDER — OXYCODONE HCL 5 MG/5ML PO SOLN: 5.0000 mg | Freq: Once | ORAL | Status: DC | PRN

## 2012-07-31 MED ORDER — LIDOCAINE HCL (CARDIAC) 20 MG/ML IV SOLN
INTRAVENOUS | Status: DC | PRN
  Administered 2012-07-31: 30 mg via INTRAVENOUS

## 2012-07-31 SURGICAL SUPPLY — 30 items
BANDAGE GAUZE ELAST BULKY 4 IN (GAUZE/BANDAGES/DRESSINGS) ×2 IMPLANT
BNDG COHESIVE 6X5 TAN STRL LF (GAUZE/BANDAGES/DRESSINGS) ×2 IMPLANT
BNDG GAUZE STRTCH 6 (GAUZE/BANDAGES/DRESSINGS) ×6 IMPLANT
CLOTH BEACON ORANGE TIMEOUT ST (SAFETY) ×2 IMPLANT
COTTON STERILE ROLL (GAUZE/BANDAGES/DRESSINGS) ×2 IMPLANT
COVER SURGICAL LIGHT HANDLE (MISCELLANEOUS) ×2 IMPLANT
DRAPE U-SHAPE 47X51 STRL (DRAPES) ×2 IMPLANT
DRSG ADAPTIC 3X8 NADH LF (GAUZE/BANDAGES/DRESSINGS) ×2 IMPLANT
DRSG VAC ATS MED SENSATRAC (GAUZE/BANDAGES/DRESSINGS) ×2 IMPLANT
DURAPREP 26ML APPLICATOR (WOUND CARE) ×2 IMPLANT
GLOVE BIOGEL PI IND STRL 9 (GLOVE) ×1 IMPLANT
GLOVE BIOGEL PI INDICATOR 9 (GLOVE) ×1
GLOVE SURG ORTHO 9.0 STRL STRW (GLOVE) ×2 IMPLANT
GOWN PREVENTION PLUS XLARGE (GOWN DISPOSABLE) ×2 IMPLANT
GOWN SRG XL XLNG 56XLVL 4 (GOWN DISPOSABLE) ×1 IMPLANT
GOWN STRL NON-REIN XL XLG LVL4 (GOWN DISPOSABLE) ×1
KIT BASIN OR (CUSTOM PROCEDURE TRAY) ×2 IMPLANT
KIT ROOM TURNOVER OR (KITS) ×2 IMPLANT
NS IRRIG 1000ML POUR BTL (IV SOLUTION) ×2 IMPLANT
PACK ORTHO EXTREMITY (CUSTOM PROCEDURE TRAY) ×2 IMPLANT
PAD ARMBOARD 7.5X6 YLW CONV (MISCELLANEOUS) ×4 IMPLANT
PADDING CAST COTTON 6X4 STRL (CAST SUPPLIES) ×2 IMPLANT
SPONGE GAUZE 4X4 12PLY (GAUZE/BANDAGES/DRESSINGS) ×2 IMPLANT
SPONGE LAP 18X18 X RAY DECT (DISPOSABLE) ×2 IMPLANT
TISSUE THERASKIN 2X3 (Tissue) ×6 IMPLANT
TOWEL OR 17X24 6PK STRL BLUE (TOWEL DISPOSABLE) ×2 IMPLANT
TOWEL OR 17X26 10 PK STRL BLUE (TOWEL DISPOSABLE) ×2 IMPLANT
TUBE ANAEROBIC SPECIMEN COL (MISCELLANEOUS) IMPLANT
UNDERPAD 30X30 INCONTINENT (UNDERPADS AND DIAPERS) ×2 IMPLANT
WATER STERILE IRR 1000ML POUR (IV SOLUTION) ×2 IMPLANT

## 2012-07-31 NOTE — Progress Notes (Signed)
Orthopedic Tech Progress Note Patient Details:  Jeremy [name] 02-22-1951 [id number] Delivered post-op shoe to pt's nurse.  Nurse stated that they were not ready to apply the device.  Left device with nurse. Patient ID: Jeremy [name], male   DOB: 06-Dec-1950, 62 y.o.   MRN: [id number]   [name] 07/31/2012, 3:06 PM

## 2012-07-31 NOTE — Progress Notes (Signed)
ANTICOAGULATION CONSULT NOTE - Initial Consult  Pharmacy Consult for warfarin Indication: history of DVT, VTE prophylaxis  No Known Allergies  Vital Signs: Temp: 98.7 F (37.1 C) (04/11 1647) Temp src: Oral (04/11 1031) BP: 100/59 mmHg (04/11 1639) Pulse Rate: 60 (04/11 1639)  Labs:  Recent Labs  07/29/12 1411 07/31/12 1044  HGB 11.7*  --   HCT 34.9*  --   PLT 217  --   APTT 37  --   LABPROT 16.8* 14.4  INR 1.40 1.14  CREATININE 0.91  --     The CrCl is unknown because both a height and weight (above a minimum accepted value) are required for this calculation.   Medical History: Past Medical History  Diagnosis Date  . Diabetes mellitus   . GERD (gastroesophageal reflux disease)   . Asthma   . Shortness of breath   . Leg pain   . Peripheral vascular disease   . Sleep apnea     uses bipap, sleep study done in [place] 2 years ago, dr Cala Bradford byrd  . Anemia   . Headache     sinus headaches  . COPD (chronic obstructive pulmonary disease)     3L continuous  . Peripheral neuropathy   . Cancer     hx basal cell skin cancer  . Psoriasis   . 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 CHF (congestive heart failure)     does not see a cardiologist    Medications:  Prescriptions prior to admission  Medication Sig Dispense Refill  . albuterol (PROVENTIL) (2.5 MG/3ML) 0.083% nebulizer solution Take 2.5 mg by nebulization every 6 (six) hours as needed. For shortness of breath      . celecoxib (CELEBREX) 200 MG capsule Take 200 mg by mouth daily.      . DULoxetine (CYMBALTA) 30 MG capsule Take 30 mg by mouth daily.      . ferrous sulfate 325 (65 FE) MG tablet Take 325 mg by mouth 3 (three) times daily with meals.       . Fluticasone-Salmeterol (ADVAIR) 500-50 MCG/DOSE AEPB Inhale 1 puff into the lungs every 12 (twelve) hours.       . furosemide (LASIX) 40 MG tablet Take 40 mg by mouth daily.       [name] gabapentin (NEURONTIN) 800 MG tablet Take 800 mg by mouth  at bedtime.      . montelukast (SINGULAIR) 10 MG tablet Take 10 mg by mouth at bedtime.       [name] oxyCODONE (OXY IR/ROXICODONE) 5 MG immediate release tablet Take 5 mg by mouth 2 (two) times daily as needed for pain (prior to dressing changes.).      [name] potassium chloride SA (K-DUR,KLOR-CON) 20 MEQ tablet Take 20 mEq by mouth daily.      . pravastatin (PRAVACHOL) 10 MG tablet Take 10 mg by mouth daily.        [name] tiotropium (SPIRIVA) 18 MCG inhalation capsule Place 18 mcg into inhaler and inhale daily.       [name] topiramate (TOPAMAX) 50 MG tablet Take 50 mg by mouth 2 (two) times daily.       . Vitamin D, Ergocalciferol, (DRISDOL) 50000 UNITS CAPS Take 50,000 Units by mouth every 7 (seven) days. Sunday      . warfarin (COUMADIN) 5 MG tablet Take 5 mg by mouth daily. Tuesday and Friday takes 2.5 mg (half-tablet) all other days take one tablet (5 mg).      [name]  levalbuterol (XOPENEX HFA) 45 MCG/ACT inhaler Inhale 1-2 puffs into the lungs every 4 (four) hours as needed. For shortness of breath        Assessment: 62 year old man on chronic warfarin for history of DVT s/p I&D of left leg ulceration 4/11 to resume warfarin post op.  INR 1.14.  Home dose is 2.5mg  on Tuesday and Friday and 5mg  on other days.  Goal of Therapy:  INR 2-3    Plan:   Warfarin 7.5mg  po x 1 dose tonight.  Daily protimes   Mickeal Skinner 07/31/2012,5:43 PM

## 2012-07-31 NOTE — Anesthesia Procedure Notes (Signed)
Procedure Name: LMA Insertion Date/Time: 07/31/2012 1:27 PM Performed by: Gayla Medicus Pre-anesthesia Checklist: Patient identified, Timeout performed, Emergency Drugs available, Suction available and Patient being monitored Patient Re-evaluated:Patient Re-evaluated prior to inductionOxygen Delivery Method: Circle system utilized Preoxygenation: Pre-oxygenation with 100% oxygen Intubation Type: IV induction LMA: LMA inserted LMA Size: 5.0 Number of attempts: 1 Placement Confirmation: positive ETCO2 and breath sounds checked- equal and bilateral Tube secured with: Tape Dental Injury: Teeth and Oropharynx as per pre-operative assessment

## 2012-07-31 NOTE — Anesthesia Preprocedure Evaluation (Addendum)
Anesthesia Evaluation  Patient identified by MRN, date of birth, ID band Patient awake    Reviewed: Allergy & Precautions, H&P , NPO status , Patient's Chart, lab work & pertinent test results  History of Anesthesia Complications Negative for: history of anesthetic complications  Airway Mallampati: I TM Distance: >3 FB Neck ROM: Full    Dental  (+) Edentulous Upper and Edentulous Lower   Pulmonary asthma , sleep apnea, Continuous Positive Airway Pressure Ventilation and Oxygen sleep apnea , COPD COPD inhaler and oxygen dependent, former smoker,  breath sounds clear to auscultation  Pulmonary exam normal       Cardiovascular + Peripheral Vascular Disease, +CHF (remote history of heart failure) and DVT (INR 1.14) Rhythm:Regular Rate:Normal     Neuro/Psych  Headaches,  Neuromuscular disease    GI/Hepatic Neg liver ROS, GERD-  Medicated and Controlled,  Endo/Other  diabetes, Well Controlled, Type obesityGlu 107  Renal/GU negative Renal ROS     Musculoskeletal   Abdominal (+) + obese,   Peds  Hematology   Anesthesia Other Findings   Reproductive/Obstetrics                          Anesthesia Physical Anesthesia Plan  ASA: III  Anesthesia Plan: General   Post-op Pain Management:    Induction: Intravenous  Airway Management Planned: LMA  Additional Equipment:   Intra-op Plan:   Post-operative Plan:   Informed Consent: I have reviewed the patients History and Physical, chart, labs and discussed the procedure including the risks, benefits and alternatives for the proposed anesthesia with the patient or authorized representative who has indicated his/her understanding and acceptance.   Dental advisory given  Plan Discussed with: Anesthesiologist, Surgeon and CRNA  Anesthesia Plan Comments: (Plan routine monitors, GA- LMA OK)       Anesthesia Quick Evaluation

## 2012-07-31 NOTE — CV Procedure (Signed)
OPERATIVE REPORT  DATE OF SURGERY: 07/31/2012  PATIENT:  Jeremy [name],  62 y.o. male  PRE-OPERATIVE DIAGNOSIS:  ulceration left leg  POST-OPERATIVE DIAGNOSIS:  ulceration left leg 9 x 25 cm  PROCEDURE:  Procedure(s): Excisional debridement skin soft tissue and muscle left leg. Apply Skin Graft 3 graft 2 x 3" for each graft allograft. APPLICATION OF WOUND VAC  medium VAC sponges x2  SURGEON:  Surgeon(s): Nadara Mustard, MD  ANESTHESIA:   general  EBL:  min ML  SPECIMEN:  No Specimen  TOURNIQUET:  * No tourniquets in log *  PROCEDURE DETAILS: Patient is a 62 year old gentleman with a chronic ulceration to the left lower extremity. 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 Risks and benefits were discussed including the potential for amputation. Patient states he understands and wished to proceed at this time. Description of procedure patient was brought to the operating room and underwent a general anesthetic. After adequate levels of anesthesia were obtained patient's left lower extremity was prepped using DuraPrep and draped into a sterile field. A 21 blade knife was used to debris this skin soft tissue and muscle with excision of skin soft tissue muscle down to bleeding viable granulation tissue. After debridement the wound measures 9 x 25 cm. The wound was then irrigated with pulsatile lavage x3 L. The meshed allograft skin graft was then placed 3 sheets 2 x 3" freed sheath and this was secured with staples. Medium VAC wound sponges x2 were then applied to cover the skin graft wound the area set to -95 mm of suction. This had a good suction fit. Patient was extubated taken to the PACU in stable condition.  PLAN OF CARE:  Admit to inpatient   PATIENT DISPOSITION:  PACU - hemodynamically stable.   Nadara Mustard, MD 07/31/2012 1:55 PM

## 2012-07-31 NOTE — Transfer of Care (Signed)
Immediate Anesthesia Transfer of Care Note  Patient: Jeremy [name]  Procedure(s) Performed: Procedure(s): Apply Skin Graft (Left) APPLICATION OF WOUND VAC (Left)  Patient Location: PACU  Anesthesia Type:General  Level of Consciousness: awake, alert  and oriented  Airway & Oxygen Therapy: Patient Spontanous Breathing and Patient connected to nasal cannula oxygen  Post-op Assessment: Report given to PACU RN, Post -op Vital signs reviewed and stable and Patient moving all extremities X 4  Post vital signs: Reviewed and stable  Complications: No apparent anesthesia complications

## 2012-07-31 NOTE — H&P (Signed)
Jeremy [name] is an 62 y.o. male.   Chief Complaint: Chronic venous stasis ulceration left leg HPI: Patient is a 62 year old gentleman who has had over 30 year history of ulceration to the left leg. Patient has undergone prolonged conservative therapy shows a initial worsening of the ulceration at this time and he presents for skin graft.  Past Medical History  Diagnosis Date  . Diabetes mellitus   . GERD (gastroesophageal reflux disease)   . Asthma   . Shortness of breath   . Leg pain   . Peripheral vascular disease   . Sleep apnea     uses bipap, sleep study done in [place] 2 years ago, dr Cala Bradford byrd  . Anemia   . Headache     sinus headaches  . COPD (chronic obstructive pulmonary disease)     3L continuous  . Peripheral neuropathy   . Cancer     hx basal cell skin cancer  . Psoriasis   . 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 CHF (congestive heart failure)     does not see a cardiologist    Past Surgical History  Procedure Laterality Date  . Arm surgery    . Vein ligation and stripping    . Tummy tuck    . Stomach stapling    . Skin grafts    . I&d extremity  03/13/2011    Procedure: IRRIGATION AND DEBRIDEMENT EXTREMITY;  Surgeon: Nadara Mustard, MD;  Location: [facility] OR;  Service: Orthopedics;  Laterality: Left;  . Skin split graft  03/13/2012    Procedure: SKIN GRAFT SPLIT THICKNESS;  Surgeon: Nadara Mustard, MD;  Location: [facility] OR;  Service: Orthopedics;  Laterality: Left;  Excisional Debridement Left Leg, Split Thickness Skin Graft, Wound VAC  . Back stimulator      Family History  Problem Relation Age of Onset  . Heart disease Brother    Social History:  reports that he quit smoking about 21 years ago. His smoking use included Cigarettes. He has a 3 pack-year smoking history. He does not have any smokeless tobacco history on file. He reports that  drinks alcohol. He reports that he does not use illicit drugs.  Allergies: No Known Allergies  No  prescriptions prior to admission    Results for orders placed during the hospital encounter of 07/29/12 (from the past 48 hour(s))  SURGICAL PCR SCREEN     Status: Abnormal   Collection Time    07/29/12  2:10 PM      Result Value Range   MRSA, PCR POSITIVE (*) NEGATIVE   Staphylococcus aureus POSITIVE (*) NEGATIVE   Comment:            The Xpert SA Assay (FDA     approved for NASAL specimens     in patients over 61 years of age),     is one component of     a comprehensive surveillance     program.  Test performance has     been validated by [employer] for patients greater     than or equal to 61 year old.     It is not intended     to diagnose infection nor to     guide or monitor treatment.  APTT     Status: None   Collection Time    07/29/12  2:11 PM      Result Value Range   aPTT  37  24 - 37 seconds   Comment:            IF BASELINE aPTT IS ELEVATED,     SUGGEST PATIENT RISK ASSESSMENT     BE USED TO DETERMINE APPROPRIATE     ANTICOAGULANT THERAPY.  CBC     Status: Abnormal   Collection Time    07/29/12  2:11 PM      Result Value Range   WBC 6.8  4.0 - 10.5 K/uL   RBC 3.90 (*) 4.22 - 5.81 MIL/uL   Hemoglobin 11.7 (*) 13.0 - 17.0 g/dL   HCT 13.0 (*) 86.5 - 78.4 %   MCV 89.5  78.0 - 100.0 fL   MCH 30.0  26.0 - 34.0 pg   MCHC 33.5  30.0 - 36.0 g/dL   RDW 69.6  29.5 - 28.4 %   Platelets 217  150 - 400 K/uL  COMPREHENSIVE METABOLIC PANEL     Status: Abnormal   Collection Time    07/29/12  2:11 PM      Result Value Range   Sodium 138  135 - 145 mEq/L   Potassium 3.8  3.5 - 5.1 mEq/L   Chloride 104  96 - 112 mEq/L   CO2 26  19 - 32 mEq/L   Glucose, Bld 109 (*) 70 - 99 mg/dL   BUN 17  6 - 23 mg/dL   Creatinine, Ser 1.32  0.50 - 1.35 mg/dL   Calcium 8.8  8.4 - 44.0 mg/dL   Total Protein 6.9  6.0 - 8.3 g/dL   Albumin 3.1 (*) 3.5 - 5.2 g/dL   AST 12  0 - 37 U/L   ALT 8  0 - 53 U/L   Alkaline Phosphatase 109  39 - 117 U/L   Total Bilirubin 0.3  0.3 - 1.2  mg/dL   GFR calc non Af Amer 90 (*) >90 mL/min   GFR calc Af Amer >90  >90 mL/min   Comment:            The eGFR has been calculated     using the CKD EPI equation.     This calculation has not been     validated in all clinical     situations.     eGFR's persistently     <90 mL/min signify     possible Chronic Kidney Disease.  PROTIME-INR     Status: Abnormal   Collection Time    07/29/12  2:11 PM      Result Value Range   Prothrombin Time 16.8 (*) 11.6 - 15.2 seconds   INR 1.40  0.00 - 1.49   Dg Chest 2 View  07/29/2012  *RADIOLOGY REPORT*  Clinical Data: 62 year old male preop study for skin graft, wound vac.  Shortness of breath hypertension diabetes.  CHEST - 2 VIEW  Comparison: 03/13/2012 and earlier.  Findings: New spinal stimulator device projecting over the lower thoracic spinal canal.  Cardiomegaly appears mildly progressed since 2013. Other mediastinal contours are within normal limits. Visualized tracheal air column is within normal limits.  No pneumothorax.  No pleural effusion or consolidation.  Mild increased interstitial markings versus vascular congestion.  No overt edema.  No acute pulmonary opacity.  IMPRESSION: Cardiomegaly which appears mildly progressed since 2013. No acute cardiopulmonary abnormality.  Interval thoracic spinal stimulator device placed.   Original Report Authenticated By: Erskine Speed, M.D.     Review of Systems  All other systems reviewed and are negative.  There were no vitals taken for this visit. Physical Exam  Examination patient has weeping edema from his leg mass of ulceration to the left lower extremity with approximately 50% of the circumference of his calf. Assessment/Plan Assessment: Massive ulceration left lower extremity failed conservative treatment.  Plan: Will plan for irrigation debridement application of split thickness skin graft allograft and placement of a wound VAC. Risks and benefits were discussed including infection  neurovascular injury nonhealing of the graft need for additional surgery. Patient states he understands and wishes to pursue at this time.   Jeremy [name] V 07/31/2012, 6:50 AM

## 2012-07-31 NOTE — Progress Notes (Signed)
Peripherally Inserted Central Catheter/Midline Placement  The IV Nurse has discussed with the patient and/or persons authorized to consent for the patient, the purpose of this procedure and the potential benefits and risks involved with this procedure.  The benefits include less needle sticks, lab draws from the catheter and patient may be discharged home with the catheter.  Risks include, but not limited to, infection, bleeding, blood clot (thrombus formation), and puncture of an artery; nerve damage and irregular heat beat.  Alternatives to this procedure were also discussed.  PICC/Midline Placement Documentation        Jeremy [name], Jeremy [name] 07/31/2012, 10:56 PM

## 2012-07-31 NOTE — Preoperative (Signed)
Beta Blockers   Reason not to administer Beta Blockers:Not Applicable 

## 2012-07-31 NOTE — Progress Notes (Signed)
Received a call from PACU nurse, Nita Sells informing me that this pt would need a PICC.  I took down the information and made her aware that the Dr. Laury Axon to enter the order in the computer.

## 2012-08-01 LAB — BASIC METABOLIC PANEL
CO2: 30 mEq/L (ref 19–32)
Chloride: 106 mEq/L (ref 96–112)
Creatinine, Ser: 0.87 mg/dL (ref 0.50–1.35)

## 2012-08-01 LAB — PROTIME-INR: INR: 1.18 (ref 0.00–1.49)

## 2012-08-01 MED ORDER — WARFARIN SODIUM 7.5 MG PO TABS
7.5000 mg | ORAL_TABLET | Freq: Once | ORAL | Status: AC
  Administered 2012-08-01: 7.5 mg via ORAL
  Filled 2012-08-01: qty 1

## 2012-08-01 NOTE — Progress Notes (Signed)
ANTICOAGULATION CONSULT NOTE - Initial Consult  Pharmacy Consult for warfarin Indication: history of DVT, VTE prophylaxis  No Known Allergies  Vital Signs: Temp: 97.6 F (36.4 C) (04/12 0639) BP: 110/68 mmHg (04/12 0639) Pulse Rate: 56 (04/12 0639)  Labs:  Recent Labs  07/29/12 1411 07/31/12 1044 08/01/12 0600  HGB 11.7*  --   --   HCT 34.9*  --   --   PLT 217  --   --   APTT 37  --   --   LABPROT 16.8* 14.4 14.8  INR 1.40 1.14 1.18  CREATININE 0.91  --  0.87    The CrCl is unknown because both a height and weight (above a minimum accepted value) are required for this calculation.   Medical History: Past Medical History  Diagnosis Date  . Diabetes mellitus   . GERD (gastroesophageal reflux disease)   . Asthma   . Shortness of breath   . Leg pain   . Peripheral vascular disease   . Sleep apnea     uses bipap, sleep study done in [place] 2 years ago, dr Cala Bradford byrd  . Anemia   . Headache     sinus headaches  . COPD (chronic obstructive pulmonary disease)     3L continuous  . Peripheral neuropathy   . Cancer     hx basal cell skin cancer  . Psoriasis   . 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 CHF (congestive heart failure)     does not see a cardiologist    Medications:  Prescriptions prior to admission  Medication Sig Dispense Refill  . albuterol (PROVENTIL) (2.5 MG/3ML) 0.083% nebulizer solution Take 2.5 mg by nebulization every 6 (six) hours as needed. For shortness of breath      . celecoxib (CELEBREX) 200 MG capsule Take 200 mg by mouth daily.      . DULoxetine (CYMBALTA) 30 MG capsule Take 30 mg by mouth daily.      . ferrous sulfate 325 (65 FE) MG tablet Take 325 mg by mouth 3 (three) times daily with meals.       . Fluticasone-Salmeterol (ADVAIR) 500-50 MCG/DOSE AEPB Inhale 1 puff into the lungs every 12 (twelve) hours.       . furosemide (LASIX) 40 MG tablet Take 40 mg by mouth daily.       [name] gabapentin (NEURONTIN) 800 MG tablet  Take 800 mg by mouth at bedtime.      . montelukast (SINGULAIR) 10 MG tablet Take 10 mg by mouth at bedtime.       [name] oxyCODONE (OXY IR/ROXICODONE) 5 MG immediate release tablet Take 5 mg by mouth 2 (two) times daily as needed for pain (prior to dressing changes.).      [name] potassium chloride SA (K-DUR,KLOR-CON) 20 MEQ tablet Take 20 mEq by mouth daily.      . pravastatin (PRAVACHOL) 10 MG tablet Take 10 mg by mouth daily.        [name] tiotropium (SPIRIVA) 18 MCG inhalation capsule Place 18 mcg into inhaler and inhale daily.       [name] topiramate (TOPAMAX) 50 MG tablet Take 50 mg by mouth 2 (two) times daily.       . Vitamin D, Ergocalciferol, (DRISDOL) 50000 UNITS CAPS Take 50,000 Units by mouth every 7 (seven) days. Sunday      . warfarin (COUMADIN) 5 MG tablet Take 5 mg by mouth daily. Tuesday and Friday takes 2.5 mg (  half-tablet) all other days take one tablet (5 mg).      [name] levalbuterol (XOPENEX HFA) 45 MCG/ACT inhaler Inhale 1-2 puffs into the lungs every 4 (four) hours as needed. For shortness of breath        Assessment: 62 year old man on chronic warfarin for history of DVT s/p I&D of left leg ulceration 4/11 to resume warfarin post op. INR 1.18 this morning. No bleeding noted  Home dose is 2.5mg  on Tuesday and Friday and 5mg  on other days.   Goal of Therapy:  INR 2-3    Plan:   Warfarin 7.5mg  po x 1 dose tonight.  Daily protimes   Bayard Hugger, PharmD, BCPS  Clinical Pharmacist  Pager: [phone]   08/01/2012,11:16 AM

## 2012-08-01 NOTE — Progress Notes (Signed)
Subjective: 1 Day Post-Op Procedure(s) (LRB): Apply Skin Graft (Left) APPLICATION OF WOUND VAC (Left) Patient reports pain as mild.  Doing well no complaints.   Objective: Vital signs in last 24 hours: Temp:  [97.6 F (36.4 C)-98.9 F (37.2 C)] 97.6 F (36.4 C) (04/12 0639) Pulse Rate:  [53-69] 56 (04/12 0639) Resp:  [6-18] 18 (04/12 0639) BP: (100-134)/(57-69) 110/68 mmHg (04/12 0639) SpO2:  [97 %-100 %] 100 % (04/12 0639)  Intake/Output from previous day: 04/11 0701 - 04/12 0700 In: 773.3 [I.V.:773.3] Out: 400 [Urine:400] Intake/Output this shift: Total I/O In: 480 [P.O.:480] Out: 200 [Drains:200]   Recent Labs  07/29/12 1411  HGB 11.7*    Recent Labs  07/29/12 1411  WBC 6.8  RBC 3.90*  HCT 34.9*  PLT 217    Recent Labs  07/29/12 1411 08/01/12 0600  NA 138 140  K 3.8 4.1  CL 104 106  CO2 26 30  BUN 17 15  CREATININE 0.91 0.87  GLUCOSE 109* 97  CALCIUM 8.8 8.5    Recent Labs  07/31/12 1044 08/01/12 0600  INR 1.14 1.18    Sensation intact distally Intact pulses distally Compartment soft Wound vac in place left lower leg   Assessment/Plan: 1 Day Post-Op Procedure(s) (LRB): Apply Skin Graft (Left) APPLICATION OF WOUND VAC (Left)  Continue wound vac plan change of wound vac on Monday  Dr. Lajoyce Corners to resume care on Monday  Richardean Canal 08/01/2012, 12:48 PM

## 2012-08-02 LAB — BASIC METABOLIC PANEL WITH GFR
BUN: 14 mg/dL (ref 6–23)
CO2: 31 meq/L (ref 19–32)
Calcium: 8.3 mg/dL — ABNORMAL LOW (ref 8.4–10.5)
Chloride: 104 meq/L (ref 96–112)
Creatinine, Ser: 0.94 mg/dL (ref 0.50–1.35)
GFR calc Af Amer: >90 mL/min
GFR calc non Af Amer: 88 mL/min — ABNORMAL LOW
Glucose, Bld: 89 mg/dL (ref 70–99)
Potassium: 3.8 meq/L (ref 3.5–5.1)
Sodium: 141 meq/L (ref 135–145)

## 2012-08-02 LAB — PROTIME-INR: Prothrombin Time: 16.2 seconds — ABNORMAL HIGH (ref 11.6–15.2)

## 2012-08-02 MED ORDER — WARFARIN SODIUM 7.5 MG PO TABS
7.5000 mg | ORAL_TABLET | Freq: Once | ORAL | Status: AC
  Administered 2012-08-02: 7.5 mg via ORAL
  Filled 2012-08-02: qty 1

## 2012-08-02 NOTE — Progress Notes (Signed)
Subjective: 2 Days Post-Op Procedure(s) (LRB): Apply Skin Graft (Left) APPLICATION OF WOUND VAC (Left) Patient reports pain as mild.  He reports no issues.  No acute changes over past 24 hours.  Objective: Vital signs in last 24 hours: Temp:  [97.9 F (36.6 C)-98 F (36.7 C)] 97.9 F (36.6 C) (04/12 2231) Pulse Rate:  [61-66] 61 (04/12 2231) Resp:  [18-20] 18 (04/12 2231) BP: (98-115)/(45-54) 115/54 mmHg (04/12 2231) SpO2:  [99 %-100 %] 100 % (04/12 2231)  Intake/Output from previous day: 04/12 0701 - 04/13 0700 In: 1486.7 [P.O.:980; I.V.:506.7] Out: 675 [Urine:600; Drains:75] Intake/Output this shift:    No results found for this basename: HGB,  in the last 72 hours No results found for this basename: WBC, RBC, HCT, PLT,  in the last 72 hours  Recent Labs  08/01/12 0600 08/02/12 0600  NA 140 141  K 4.1 3.8  CL 106 104  CO2 30 31  BUN 15 14  CREATININE 0.87 0.94  GLUCOSE 97 89  CALCIUM 8.5 8.3*    Recent Labs  08/01/12 0600 08/02/12 0600  INR 1.18 1.33    No cellulitis present Vac over leg wounds with good seal. Foot well perfused  Assessment/Plan: 2 Days Post-Op Procedure(s) (LRB): Apply Skin Graft (Left) APPLICATION OF WOUND VAC (Left) Continue vac over graft on leg to allow for wound healing and graft to mature.  Alazae Crymes Y 08/02/2012, 7:02 AM

## 2012-08-02 NOTE — Progress Notes (Signed)
ANTICOAGULATION CONSULT NOTE - Initial Consult  Pharmacy Consult for warfarin Indication: history of DVT, VTE prophylaxis  No Known Allergies  Vital Signs: Temp: 98.2 F (36.8 C) (04/13 0702) BP: 107/52 mmHg (04/13 0702) Pulse Rate: 57 (04/13 0702)  Labs:  Recent Labs  07/31/12 1044 08/01/12 0600 08/02/12 0600  LABPROT 14.4 14.8 16.2*  INR 1.14 1.18 1.33  CREATININE  --  0.87 0.94    The CrCl is unknown because both a height and weight (above a minimum accepted value) are required for this calculation.   Medical History: Past Medical History  Diagnosis Date  . Diabetes mellitus   . GERD (gastroesophageal reflux disease)   . Asthma   . Shortness of breath   . Leg pain   . Peripheral vascular disease   . Sleep apnea     uses bipap, sleep study done in [place] 2 years ago, dr Cala Bradford byrd  . Anemia   . Headache     sinus headaches  . COPD (chronic obstructive pulmonary disease)     3L continuous  . Peripheral neuropathy   . Cancer     hx basal cell skin cancer  . Psoriasis   . 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 CHF (congestive heart failure)     does not see a cardiologist    Medications:  Prescriptions prior to admission  Medication Sig Dispense Refill  . albuterol (PROVENTIL) (2.5 MG/3ML) 0.083% nebulizer solution Take 2.5 mg by nebulization every 6 (six) hours as needed. For shortness of breath      . celecoxib (CELEBREX) 200 MG capsule Take 200 mg by mouth daily.      . DULoxetine (CYMBALTA) 30 MG capsule Take 30 mg by mouth daily.      . ferrous sulfate 325 (65 FE) MG tablet Take 325 mg by mouth 3 (three) times daily with meals.       . Fluticasone-Salmeterol (ADVAIR) 500-50 MCG/DOSE AEPB Inhale 1 puff into the lungs every 12 (twelve) hours.       . furosemide (LASIX) 40 MG tablet Take 40 mg by mouth daily.       [name] gabapentin (NEURONTIN) 800 MG tablet Take 800 mg by mouth at bedtime.      . montelukast (SINGULAIR) 10 MG tablet Take 10  mg by mouth at bedtime.       [name] oxyCODONE (OXY IR/ROXICODONE) 5 MG immediate release tablet Take 5 mg by mouth 2 (two) times daily as needed for pain (prior to dressing changes.).      [name] potassium chloride SA (K-DUR,KLOR-CON) 20 MEQ tablet Take 20 mEq by mouth daily.      . pravastatin (PRAVACHOL) 10 MG tablet Take 10 mg by mouth daily.        [name] tiotropium (SPIRIVA) 18 MCG inhalation capsule Place 18 mcg into inhaler and inhale daily.       [name] topiramate (TOPAMAX) 50 MG tablet Take 50 mg by mouth 2 (two) times daily.       . Vitamin D, Ergocalciferol, (DRISDOL) 50000 UNITS CAPS Take 50,000 Units by mouth every 7 (seven) days. Sunday      . warfarin (COUMADIN) 5 MG tablet Take 5 mg by mouth daily. Tuesday and Friday takes 2.5 mg (half-tablet) all other days take one tablet (5 mg).      [name] levalbuterol (XOPENEX HFA) 45 MCG/ACT inhaler Inhale 1-2 puffs into the lungs every 4 (four) hours as needed. For shortness of  breath        Assessment: 62 year old man on chronic warfarin for history of DVT s/p I&D of left leg ulceration 4/11 to resume warfarin post op. INR trending up to 1.33 this morning. No bleeding noted per chart  Home dose is 2.5mg  on Tuesday and Friday and 5mg  on other days.   Goal of Therapy:  INR 2-3    Plan:   Warfarin 7.5mg  po x 1 dose tonight.  Daily PT/INR   Bayard Hugger, PharmD, BCPS  Clinical Pharmacist  Pager: [phone]   08/02/2012,10:52 AM

## 2012-08-03 LAB — BASIC METABOLIC PANEL
BUN: 14 mg/dL (ref 6–23)
CO2: 32 mEq/L (ref 19–32)
Calcium: 8.3 mg/dL — ABNORMAL LOW (ref 8.4–10.5)
Chloride: 103 mEq/L (ref 96–112)
Creatinine, Ser: 0.91 mg/dL (ref 0.50–1.35)

## 2012-08-03 LAB — PROTIME-INR: Prothrombin Time: 16.9 seconds — ABNORMAL HIGH (ref 11.6–15.2)

## 2012-08-03 MED ORDER — VANCOMYCIN HCL IN DEXTROSE 1-5 GM/200ML-% IV SOLN
1000.0000 mg | Freq: Two times a day (BID) | INTRAVENOUS | Status: DC
  Administered 2012-08-03: 1000 mg via INTRAVENOUS
  Filled 2012-08-03 (×3): qty 200

## 2012-08-03 MED ORDER — PIPERACILLIN-TAZOBACTAM 3.375 G IVPB
3.3750 g | Freq: Three times a day (TID) | INTRAVENOUS | Status: DC
  Administered 2012-08-03: 3.375 g via INTRAVENOUS
  Filled 2012-08-03 (×4): qty 50

## 2012-08-03 MED ORDER — PIPERACILLIN-TAZOBACTAM 3.375 G IVPB
3.3750 g | Freq: Once | INTRAVENOUS | Status: AC
  Administered 2012-08-03: 3.375 g via INTRAVENOUS
  Filled 2012-08-03: qty 50

## 2012-08-03 MED ORDER — HEPARIN SOD (PORK) LOCK FLUSH 100 UNIT/ML IV SOLN
250.0000 [IU] | INTRAVENOUS | Status: AC | PRN
  Administered 2012-08-03: 250 [IU]

## 2012-08-03 MED ORDER — CIPROFLOXACIN HCL 500 MG PO TABS: 500.0000 mg | ORAL_TABLET | Freq: Two times a day (BID) | ORAL | Status: DC

## 2012-08-03 MED ORDER — VANCOMYCIN HCL IN DEXTROSE 1-5 GM/200ML-% IV SOLN: 1000.0000 mg | Freq: Two times a day (BID) | INTRAVENOUS | Status: AC

## 2012-08-03 NOTE — Care Management Note (Signed)
CARE MANAGEMENT NOTE 08/03/2012  Patient:  Jeremy [name], Jeremy [name]   Account Number:  [id number]  Date Initiated:  08/03/2012  Documentation initiated by:  Vance Peper  Subjective/Objective Assessment:   9 yr olf male admitted with left lower extremity wound. S/p skin grafting. Patient will be on IV Vanc BID for 4 weeks.     Action/Plan:   CM spoke with patient and wife concerning home health needs. Choice offered. Discussed wife's concerns re: antibiotic administration . Faxed orders to [facility] [email or web address] [phone], contacted Pam with [facility] for IV Vanc management.   Anticipated DC Date:  08/03/2012   Anticipated DC Plan:  HOME W HOME HEALTH SERVICES      DC Planning Services  CM consult  Other      [facility] Choice  HOME HEALTH   Choice offered to / List presented to:  C-1 Patient        HH arranged  IV Antibiotics  HH-1 RN      [facility] agency  [facility]  [facility]   Status of service:  Completed, signed off Medicare Important Message given?   (If response is "NO", the following Medicare IM given date fields will be blank) Date Medicare IM given:   Date Additional Medicare IM given:    Discharge Disposition:  HOME W HOME HEALTH SERVICES  Per UR Regulation:    If discussed at Long Length of Stay Meetings, dates discussed:    Comments:

## 2012-08-03 NOTE — Progress Notes (Signed)
UR COMPLETED  

## 2012-08-03 NOTE — Discharge Summary (Signed)
Physician Discharge Summary  Patient ID: Jeremy [name] MRN: [id number] DOB/AGE: 08/15/1950 62 y.o.  Admit date: 07/31/2012 Discharge date: 08/03/2012  Admission Diagnoses: Chronic ulceration left leg  Discharge Diagnoses: Chronic ulceration left leg Active Problems:   * No active hospital problems. *   Discharged Condition: stable  Hospital Course: Patient's hospital course was essentially unremarkable. He underwent debridement of the left leg chronic ulcers and application of cadaver skin graft left leg and application of wound VAC. Patient progressed well through the weekend and will be discharged on antibiotics to include vancomycin and Cipro.  Consults: None  Significant Diagnostic Studies: labs: Routine labs and placement of PICC line  Treatments: antibiotics: vancomycin and Zosyn and surgery: See operative note  Discharge Exam: Blood pressure 108/57, pulse 55, temperature 98.5 F (36.9 C), temperature source Oral, resp. rate 16, SpO2 100.00%. Incision/Wound: incision with healthy granulation tissue at time of discharge  Disposition: 06-Home-Health Care Svc  Discharge Orders   Future Orders Complete By Expires     Ambulatory referral to Home Health  As directed     Comments:      Please evaluate Jeremy [name] for admission to [facility].  Disciplines requested: Nursing  Services to provide: IV Antibiotics  Physician to follow patient's care (the person listed here will be responsible for signing ongoing orders): Referring Provider  Requested Start of Care Date: Tomorrow  Special Instructions:  IV vancomycin for 4 weeks. Pharmacy to dose and monitor her vancomycin level.    Call MD / Call 911  As directed     Comments:      If you experience chest pain or shortness of breath, CALL 911 and be transported to the hospital emergency room.  If you develope a fever above 101 F, pus (white drainage) or increased drainage or redness at the wound, or calf pain, call your  surgeon's office.    Constipation Prevention  As directed     Comments:      Drink plenty of fluids.  Prune juice may be helpful.  You may use a stool softener, such as Colace (over the counter) 100 mg twice a day.  Use MiraLax (over the counter) for constipation as needed.    Diet - low sodium heart healthy  As directed     Increase activity slowly as tolerated  As directed         Medication List    TAKE these medications       albuterol (2.5 MG/3ML) 0.083% nebulizer solution  Commonly known as:  PROVENTIL  Take 2.5 mg by nebulization every 6 (six) hours as needed. For shortness of breath     celecoxib 200 MG capsule  Commonly known as:  CELEBREX  Take 200 mg by mouth daily.     ciprofloxacin 500 MG tablet  Commonly known as:  CIPRO  Take 1 tablet (500 mg total) by mouth 2 (two) times daily.     DULoxetine 30 MG capsule  Commonly known as:  CYMBALTA  Take 30 mg by mouth daily.     ferrous sulfate 325 (65 FE) MG tablet  Take 325 mg by mouth 3 (three) times daily with meals.     Fluticasone-Salmeterol 500-50 MCG/DOSE Aepb  Commonly known as:  ADVAIR  Inhale 1 puff into the lungs every 12 (twelve) hours.     furosemide 40 MG tablet  Commonly known as:  LASIX  Take 40 mg by mouth daily.     gabapentin 800 MG tablet  Commonly known as:  NEURONTIN  Take 800 mg by mouth at bedtime.     montelukast 10 MG tablet  Commonly known as:  SINGULAIR  Take 10 mg by mouth at bedtime.     oxyCODONE 5 MG immediate release tablet  Commonly known as:  Oxy IR/ROXICODONE  Take 5 mg by mouth 2 (two) times daily as needed for pain (prior to dressing changes.).     potassium chloride SA 20 MEQ tablet  Commonly known as:  K-DUR,KLOR-CON  Take 20 mEq by mouth daily.     pravastatin 10 MG tablet  Commonly known as:  PRAVACHOL  Take 10 mg by mouth daily.     tiotropium 18 MCG inhalation capsule  Commonly known as:  SPIRIVA  Place 18 mcg into inhaler and inhale daily.     topiramate  50 MG tablet  Commonly known as:  TOPAMAX  Take 50 mg by mouth 2 (two) times daily.     vancomycin 1 GM/200ML Soln  Commonly known as:  VANCOCIN  Inject 200 mLs (1,000 mg total) into the vein every 12 (twelve) hours.     Vitamin D (Ergocalciferol) 50000 UNITS Caps  Commonly known as:  DRISDOL  Take 50,000 Units by mouth every 7 (seven) days. Sunday     warfarin 5 MG tablet  Commonly known as:  COUMADIN  Take 5 mg by mouth daily. Tuesday and Friday takes 2.5 mg (half-tablet) all other days take one tablet (5 mg).     XOPENEX HFA 45 MCG/ACT inhaler  Generic drug:  levalbuterol  Inhale 1-2 puffs into the lungs every 4 (four) hours as needed. For shortness of breath           Follow-up Information   Follow up with Jeremy Heslin V, MD In 1 week.   Contact information:   [address] [place] [address] [place] [address] [phone]       Signed: Nadara Mustard 08/03/2012, 6:33 AM

## 2012-08-04 ENCOUNTER — Encounter (HOSPITAL_COMMUNITY): Payer: Self-pay | Admitting: Orthopedic Surgery

## 2012-08-04 NOTE — Anesthesia Postprocedure Evaluation (Signed)
  Anesthesia Post-op Note  Patient: Jeremy [name]  Procedure(s) Performed: Procedure(s): Apply Skin Graft (Left) APPLICATION OF WOUND VAC (Left)  Patient discharged prior to post op note.  VSS throughout and No reported anesthetic complications.

## 2012-08-17 ENCOUNTER — Other Ambulatory Visit (HOSPITAL_COMMUNITY): Payer: Self-pay | Admitting: Orthopedic Surgery

## 2012-08-17 ENCOUNTER — Ambulatory Visit (HOSPITAL_COMMUNITY)
Admission: RE | Admit: 2012-08-17 | Discharge: 2012-08-17 | Disposition: A | Discharge status: Home / Self Care | Payer: Medicare Other | Source: Ambulatory Visit | Attending: Orthopedic Surgery | Admitting: Orthopedic Surgery

## 2012-08-17 DIAGNOSIS — B999 Unspecified infectious disease: Secondary | ICD-10-CM

## 2012-08-17 DIAGNOSIS — L02419 Cutaneous abscess of limb, unspecified: Secondary | ICD-10-CM | POA: Insufficient documentation

## 2012-08-17 NOTE — Procedures (Signed)
Successful Rt UE basilic vein SL PICC. Length 40cm, tip at lower SVC/RA No complications

## 2012-11-05 ENCOUNTER — Other Ambulatory Visit (HOSPITAL_COMMUNITY): Payer: Self-pay | Admitting: Orthopedic Surgery

## 2012-11-05 ENCOUNTER — Encounter (HOSPITAL_COMMUNITY): Payer: Self-pay | Admitting: *Deleted

## 2012-11-05 ENCOUNTER — Encounter (HOSPITAL_COMMUNITY): Payer: Self-pay | Admitting: Pharmacy Technician

## 2012-11-05 NOTE — Progress Notes (Signed)
Pt denies SOB, chest pain and being under the care of a cardiologist. Pt states that he had an echo and stress test a year ago in [place], [place].; will request results DOS if needed. Pt also states that he had a cardiac cath in 1985 at [facility] of [place]. Pt states that he wears 3 liters of oxygen at all times in addition to wearing a BIPAP for sleep apnea. Revonda Standard, PA ( anesthesia) made aware but is not concerned because pt has had recent surgeries. Pt advised to bring in mask on DOS. Pt advised to take morning medications indicated on "Pre-Op Call"  with sips of water on DOS; pt stated that he doubt if he will take any medications at all. Pt was educated on the importance and the consequences of not taking medications. Pt stated that staff at Dr. Audrie Lia office advised him to continue with Coumadin. Pt advised to stop stop taking Aspirin and herbal medications. Do not take any NSAIDs ie: Ibuprofen, Advil, Naproxen or any medication containing Aspirin such as celecoxib (CELEBREX) 200 MG capsule.

## 2012-11-06 ENCOUNTER — Encounter (HOSPITAL_COMMUNITY): Payer: Self-pay | Admitting: *Deleted

## 2012-11-06 ENCOUNTER — Encounter (HOSPITAL_COMMUNITY): Payer: Self-pay | Admitting: Vascular Surgery

## 2012-11-06 ENCOUNTER — Other Ambulatory Visit: Payer: Self-pay

## 2012-11-06 ENCOUNTER — Inpatient Hospital Stay (HOSPITAL_COMMUNITY)
Admission: RE | Admit: 2012-11-06 | Discharge: 2012-11-10 | DRG: 575 | Disposition: A | Discharge status: Home / Self Care | Payer: Medicare Other | Source: Ambulatory Visit | Attending: Orthopedic Surgery | Admitting: Orthopedic Surgery

## 2012-11-06 ENCOUNTER — Encounter (HOSPITAL_COMMUNITY)
Admission: RE | Disposition: A | Discharge status: Home / Self Care | Payer: Self-pay | Source: Ambulatory Visit | Attending: Orthopedic Surgery

## 2012-11-06 ENCOUNTER — Inpatient Hospital Stay (HOSPITAL_COMMUNITY): Payer: Medicare Other | Admitting: Vascular Surgery

## 2012-11-06 DIAGNOSIS — I739 Peripheral vascular disease, unspecified: Secondary | ICD-10-CM | POA: Diagnosis present

## 2012-11-06 DIAGNOSIS — K219 Gastro-esophageal reflux disease without esophagitis: Secondary | ICD-10-CM | POA: Diagnosis present

## 2012-11-06 DIAGNOSIS — I872 Venous insufficiency (chronic) (peripheral): Secondary | ICD-10-CM | POA: Diagnosis present

## 2012-11-06 DIAGNOSIS — Z85828 Personal history of other malignant neoplasm of skin: Secondary | ICD-10-CM

## 2012-11-06 DIAGNOSIS — Z7901 Long term (current) use of anticoagulants: Secondary | ICD-10-CM

## 2012-11-06 DIAGNOSIS — J449 Chronic obstructive pulmonary disease, unspecified: Secondary | ICD-10-CM | POA: Diagnosis present

## 2012-11-06 DIAGNOSIS — I83221 Varicose veins of left lower extremity with both ulcer of thigh and inflammation: Secondary | ICD-10-CM

## 2012-11-06 DIAGNOSIS — D649 Anemia, unspecified: Secondary | ICD-10-CM | POA: Diagnosis present

## 2012-11-06 DIAGNOSIS — G609 Hereditary and idiopathic neuropathy, unspecified: Secondary | ICD-10-CM | POA: Diagnosis present

## 2012-11-06 DIAGNOSIS — E119 Type 2 diabetes mellitus without complications: Secondary | ICD-10-CM | POA: Diagnosis present

## 2012-11-06 DIAGNOSIS — J4489 Other specified chronic obstructive pulmonary disease: Secondary | ICD-10-CM | POA: Diagnosis present

## 2012-11-06 DIAGNOSIS — I509 Heart failure, unspecified: Secondary | ICD-10-CM | POA: Diagnosis present

## 2012-11-06 DIAGNOSIS — Z86718 Personal history of other venous thrombosis and embolism: Secondary | ICD-10-CM

## 2012-11-06 DIAGNOSIS — Z87891 Personal history of nicotine dependence: Secondary | ICD-10-CM

## 2012-11-06 DIAGNOSIS — L98499 Non-pressure chronic ulcer of skin of other sites with unspecified severity: Principal | ICD-10-CM | POA: Diagnosis present

## 2012-11-06 DIAGNOSIS — G4730 Sleep apnea, unspecified: Secondary | ICD-10-CM | POA: Diagnosis present

## 2012-11-06 HISTORY — PX: I & D EXTREMITY: SHX5045

## 2012-11-06 LAB — CBC
HCT: 37.4 % — ABNORMAL LOW (ref 39.0–52.0)
Hemoglobin: 12.8 g/dL — ABNORMAL LOW (ref 13.0–17.0)
RDW: 14.4 % (ref 11.5–15.5)
WBC: 6.2 10*3/uL (ref 4.0–10.5)

## 2012-11-06 LAB — COMPREHENSIVE METABOLIC PANEL
ALT: 12 U/L (ref 0–53)
Albumin: 3.2 g/dL — ABNORMAL LOW (ref 3.5–5.2)
Alkaline Phosphatase: 118 U/L — ABNORMAL HIGH (ref 39–117)
BUN: 18 mg/dL (ref 6–23)
Chloride: 106 mEq/L (ref 96–112)
Glucose, Bld: 112 mg/dL — ABNORMAL HIGH (ref 70–99)
Potassium: 3.7 mEq/L (ref 3.5–5.1)
Sodium: 143 mEq/L (ref 135–145)
Total Bilirubin: 0.4 mg/dL (ref 0.3–1.2)

## 2012-11-06 LAB — GLUCOSE, CAPILLARY
Glucose-Capillary: 95 mg/dL (ref 70–99)
Glucose-Capillary: 103 mg/dL — ABNORMAL HIGH (ref 70–99)

## 2012-11-06 LAB — SURGICAL PCR SCREEN: Staphylococcus aureus: NEGATIVE

## 2012-11-06 LAB — APTT: aPTT: 36 seconds (ref 24–37)

## 2012-11-06 LAB — PROTIME-INR: Prothrombin Time: 18.4 seconds — ABNORMAL HIGH (ref 11.6–15.2)

## 2012-11-06 SURGERY — IRRIGATION AND DEBRIDEMENT EXTREMITY
Anesthesia: General | Site: Leg Lower | Laterality: Left | Wound class: Dirty or Infected

## 2012-11-06 MED ORDER — MUPIROCIN 2 % EX OINT
TOPICAL_OINTMENT | Freq: Two times a day (BID) | CUTANEOUS | Status: DC
  Filled 2012-11-06: qty 22

## 2012-11-06 MED ORDER — METOCLOPRAMIDE HCL 5 MG/ML IJ SOLN: 5.0000 mg | Freq: Three times a day (TID) | INTRAMUSCULAR | Status: DC | PRN

## 2012-11-06 MED ORDER — SODIUM CHLORIDE 0.9 % IV SOLN
INTRAVENOUS | Status: DC
  Administered 2012-11-06: 20:00:00 via INTRAVENOUS

## 2012-11-06 MED ORDER — ONDANSETRON HCL 4 MG PO TABS: 4.0000 mg | ORAL_TABLET | Freq: Four times a day (QID) | ORAL | Status: DC | PRN

## 2012-11-06 MED ORDER — ALBUTEROL SULFATE (5 MG/ML) 0.5% IN NEBU: 2.5000 mg | INHALATION_SOLUTION | Freq: Four times a day (QID) | RESPIRATORY_TRACT | Status: DC | PRN

## 2012-11-06 MED ORDER — FENTANYL CITRATE 0.05 MG/ML IJ SOLN
INTRAMUSCULAR | Status: DC | PRN
  Administered 2012-11-06 (×5): 50 ug via INTRAVENOUS

## 2012-11-06 MED ORDER — HYDROMORPHONE HCL PF 1 MG/ML IJ SOLN
INTRAMUSCULAR | Status: AC
  Filled 2012-11-06: qty 1

## 2012-11-06 MED ORDER — HYDROMORPHONE HCL PF 1 MG/ML IJ SOLN
0.5000 mg | INTRAMUSCULAR | Status: DC | PRN
  Administered 2012-11-06 – 2012-11-10 (×20): 1 mg via INTRAVENOUS
  Filled 2012-11-06 (×21): qty 1

## 2012-11-06 MED ORDER — PROPOFOL 10 MG/ML IV BOLUS
INTRAVENOUS | Status: DC | PRN
  Administered 2012-11-06: 140 mg via INTRAVENOUS

## 2012-11-06 MED ORDER — MIDAZOLAM HCL 5 MG/5ML IJ SOLN
INTRAMUSCULAR | Status: DC | PRN
  Administered 2012-11-06 (×2): 1 mg via INTRAVENOUS

## 2012-11-06 MED ORDER — FERROUS SULFATE 325 (65 FE) MG PO TABS
325.0000 mg | ORAL_TABLET | Freq: Three times a day (TID) | ORAL | Status: DC
  Administered 2012-11-07 – 2012-11-09 (×9): 325 mg via ORAL
  Filled 2012-11-06 (×13): qty 1

## 2012-11-06 MED ORDER — TOPIRAMATE 25 MG PO TABS
50.0000 mg | ORAL_TABLET | Freq: Every day | ORAL | Status: DC
  Administered 2012-11-07 – 2012-11-08 (×2): 50 mg via ORAL
  Filled 2012-11-06 (×4): qty 2

## 2012-11-06 MED ORDER — TOPIRAMATE 100 MG PO TABS
100.0000 mg | ORAL_TABLET | Freq: Two times a day (BID) | ORAL | Status: DC
  Administered 2012-11-06 – 2012-11-09 (×7): 100 mg via ORAL
  Filled 2012-11-06 (×10): qty 1

## 2012-11-06 MED ORDER — OXYCODONE HCL 5 MG/5ML PO SOLN: 5.0000 mg | Freq: Once | ORAL | Status: AC | PRN

## 2012-11-06 MED ORDER — FUROSEMIDE 40 MG PO TABS
40.0000 mg | ORAL_TABLET | Freq: Every day | ORAL | Status: DC
  Administered 2012-11-07 – 2012-11-09 (×3): 40 mg via ORAL
  Filled 2012-11-06 (×5): qty 1

## 2012-11-06 MED ORDER — ONDANSETRON HCL 4 MG/2ML IJ SOLN
INTRAMUSCULAR | Status: DC | PRN
  Administered 2012-11-06: 4 mg via INTRAVENOUS

## 2012-11-06 MED ORDER — EPHEDRINE SULFATE 50 MG/ML IJ SOLN
INTRAMUSCULAR | Status: DC | PRN
  Administered 2012-11-06: 10 mg via INTRAVENOUS

## 2012-11-06 MED ORDER — WARFARIN - PHYSICIAN DOSING INPATIENT: Freq: Every day | Status: DC

## 2012-11-06 MED ORDER — ARTIFICIAL TEARS OP OINT
TOPICAL_OINTMENT | OPHTHALMIC | Status: DC | PRN
  Administered 2012-11-06: 1 via OPHTHALMIC

## 2012-11-06 MED ORDER — VANCOMYCIN HCL IN DEXTROSE 1-5 GM/200ML-% IV SOLN: 1000.0000 mg | Freq: Two times a day (BID) | INTRAVENOUS | Status: DC

## 2012-11-06 MED ORDER — MOMETASONE FURO-FORMOTEROL FUM 100-5 MCG/ACT IN AERO
2.0000 | INHALATION_SPRAY | Freq: Two times a day (BID) | RESPIRATORY_TRACT | Status: DC
  Administered 2012-11-06 – 2012-11-09 (×7): 2 via RESPIRATORY_TRACT
  Filled 2012-11-06: qty 8.8

## 2012-11-06 MED ORDER — VANCOMYCIN HCL 10 G IV SOLR
1250.0000 mg | Freq: Two times a day (BID) | INTRAVENOUS | Status: DC
  Administered 2012-11-06 – 2012-11-09 (×7): 1250 mg via INTRAVENOUS
  Filled 2012-11-06 (×9): qty 1250

## 2012-11-06 MED ORDER — PROMETHAZINE HCL 25 MG/ML IJ SOLN: 6.2500 mg | INTRAMUSCULAR | Status: DC | PRN

## 2012-11-06 MED ORDER — OXYCODONE HCL 5 MG PO TABS
5.0000 mg | ORAL_TABLET | ORAL | Status: DC | PRN
  Administered 2012-11-06 – 2012-11-09 (×12): 5 mg via ORAL
  Filled 2012-11-06 (×12): qty 1

## 2012-11-06 MED ORDER — MONTELUKAST SODIUM 10 MG PO TABS
10.0000 mg | ORAL_TABLET | Freq: Every day | ORAL | Status: DC
  Administered 2012-11-06 – 2012-11-09 (×4): 10 mg via ORAL
  Filled 2012-11-06 (×5): qty 1

## 2012-11-06 MED ORDER — WARFARIN SODIUM 7.5 MG PO TABS
7.5000 mg | ORAL_TABLET | Freq: Every day | ORAL | Status: DC
  Administered 2012-11-06 – 2012-11-09 (×4): 7.5 mg via ORAL
  Filled 2012-11-06 (×5): qty 1

## 2012-11-06 MED ORDER — LIDOCAINE HCL (CARDIAC) 20 MG/ML IV SOLN
INTRAVENOUS | Status: DC | PRN
  Administered 2012-11-06: 90 mg via INTRAVENOUS

## 2012-11-06 MED ORDER — SODIUM CHLORIDE 0.9 % IR SOLN
Status: DC | PRN
  Administered 2012-11-06: 1000 mL

## 2012-11-06 MED ORDER — LEVALBUTEROL TARTRATE 45 MCG/ACT IN AERO: 2.0000 | INHALATION_SPRAY | Freq: Every day | RESPIRATORY_TRACT | Status: DC | PRN

## 2012-11-06 MED ORDER — DULOXETINE HCL 30 MG PO CPEP
30.0000 mg | ORAL_CAPSULE | Freq: Every day | ORAL | Status: DC
  Administered 2012-11-07 – 2012-11-09 (×3): 30 mg via ORAL
  Filled 2012-11-06 (×4): qty 1

## 2012-11-06 MED ORDER — FENTANYL 25 MCG/HR TD PT72
25.0000 ug | MEDICATED_PATCH | TRANSDERMAL | Status: DC
  Administered 2012-11-09: 25 ug via TRANSDERMAL
  Filled 2012-11-06: qty 1

## 2012-11-06 MED ORDER — MUPIROCIN 2 % EX OINT
TOPICAL_OINTMENT | CUTANEOUS | Status: AC
  Filled 2012-11-06: qty 22

## 2012-11-06 MED ORDER — LACTATED RINGERS IV SOLN
INTRAVENOUS | Status: DC
  Administered 2012-11-06: 12:00:00 via INTRAVENOUS

## 2012-11-06 MED ORDER — HYDROMORPHONE HCL PF 1 MG/ML IJ SOLN
0.2500 mg | INTRAMUSCULAR | Status: DC | PRN
  Administered 2012-11-06: 1 mg via INTRAVENOUS

## 2012-11-06 MED ORDER — OXYCODONE HCL 5 MG PO TABS
ORAL_TABLET | ORAL | Status: AC
  Filled 2012-11-06: qty 2

## 2012-11-06 MED ORDER — OXYCODONE HCL 5 MG PO TABS
5.0000 mg | ORAL_TABLET | Freq: Once | ORAL | Status: AC | PRN
  Administered 2012-11-06: 5 mg via ORAL

## 2012-11-06 MED ORDER — ONDANSETRON HCL 4 MG/2ML IJ SOLN
4.0000 mg | Freq: Four times a day (QID) | INTRAMUSCULAR | Status: DC | PRN
  Administered 2012-11-08: 4 mg via INTRAVENOUS
  Filled 2012-11-06: qty 2

## 2012-11-06 MED ORDER — POTASSIUM CHLORIDE CRYS ER 20 MEQ PO TBCR
20.0000 meq | EXTENDED_RELEASE_TABLET | Freq: Every day | ORAL | Status: DC
  Administered 2012-11-07 – 2012-11-09 (×3): 20 meq via ORAL
  Filled 2012-11-06 (×4): qty 1

## 2012-11-06 MED ORDER — METOCLOPRAMIDE HCL 10 MG PO TABS: 5.0000 mg | ORAL_TABLET | Freq: Three times a day (TID) | ORAL | Status: DC | PRN

## 2012-11-06 MED ORDER — LACTATED RINGERS IV SOLN
INTRAVENOUS | Status: DC | PRN
  Administered 2012-11-06: 16:00:00 via INTRAVENOUS

## 2012-11-06 MED ORDER — CEFAZOLIN SODIUM-DEXTROSE 2-3 GM-% IV SOLR
2.0000 g | INTRAVENOUS | Status: AC
  Administered 2012-11-06: 2 g via INTRAVENOUS
  Filled 2012-11-06: qty 50

## 2012-11-06 SURGICAL SUPPLY — 46 items
BLADE SURG 10 STRL SS (BLADE) ×2 IMPLANT
BNDG COHESIVE 4X5 TAN STRL (GAUZE/BANDAGES/DRESSINGS) IMPLANT
BNDG COHESIVE 6X5 TAN STRL LF (GAUZE/BANDAGES/DRESSINGS) ×2 IMPLANT
BNDG GAUZE STRTCH 6 (GAUZE/BANDAGES/DRESSINGS) ×2 IMPLANT
CLOTH BEACON ORANGE TIMEOUT ST (SAFETY) ×2 IMPLANT
COTTON STERILE ROLL (GAUZE/BANDAGES/DRESSINGS) ×2 IMPLANT
COVER SURGICAL LIGHT HANDLE (MISCELLANEOUS) ×2 IMPLANT
CUFF TOURNIQUET SINGLE 18IN (TOURNIQUET CUFF) IMPLANT
CUFF TOURNIQUET SINGLE 24IN (TOURNIQUET CUFF) IMPLANT
CUFF TOURNIQUET SINGLE 34IN LL (TOURNIQUET CUFF) IMPLANT
CUFF TOURNIQUET SINGLE 44IN (TOURNIQUET CUFF) IMPLANT
DRAPE U-SHAPE 47X51 STRL (DRAPES) ×2 IMPLANT
DRSG ADAPTIC 3X8 NADH LF (GAUZE/BANDAGES/DRESSINGS) IMPLANT
DRSG MEPITEL 4X7.2 (GAUZE/BANDAGES/DRESSINGS) ×2 IMPLANT
DRSG VAC ATS MED SENSATRAC (GAUZE/BANDAGES/DRESSINGS) ×2 IMPLANT
DRSG VAC ATS SM SENSATRAC (GAUZE/BANDAGES/DRESSINGS) ×2 IMPLANT
DURAPREP 26ML APPLICATOR (WOUND CARE) ×2 IMPLANT
ELECT CAUTERY BLADE 6.4 (BLADE) ×2 IMPLANT
ELECT REM PT RETURN 9FT ADLT (ELECTROSURGICAL)
ELECTRODE REM PT RTRN 9FT ADLT (ELECTROSURGICAL) IMPLANT
GLOVE BIOGEL PI IND STRL 9 (GLOVE) ×1 IMPLANT
GLOVE BIOGEL PI INDICATOR 9 (GLOVE) ×1
GLOVE SURG ORTHO 9.0 STRL STRW (GLOVE) ×2 IMPLANT
GOWN PREVENTION PLUS XLARGE (GOWN DISPOSABLE) ×2 IMPLANT
GOWN SRG XL XLNG 56XLVL 4 (GOWN DISPOSABLE) ×1 IMPLANT
GOWN STRL NON-REIN XL XLG LVL4 (GOWN DISPOSABLE) ×1
HANDPIECE INTERPULSE COAX TIP (DISPOSABLE)
KIT BASIN OR (CUSTOM PROCEDURE TRAY) ×2 IMPLANT
KIT ROOM TURNOVER OR (KITS) ×2 IMPLANT
MANIFOLD NEPTUNE II (INSTRUMENTS) ×2 IMPLANT
NS IRRIG 1000ML POUR BTL (IV SOLUTION) ×2 IMPLANT
PACK ORTHO EXTREMITY (CUSTOM PROCEDURE TRAY) ×2 IMPLANT
PAD ARMBOARD 7.5X6 YLW CONV (MISCELLANEOUS) ×2 IMPLANT
PADDING CAST COTTON 6X4 STRL (CAST SUPPLIES) ×2 IMPLANT
SET HNDPC FAN SPRY TIP SCT (DISPOSABLE) IMPLANT
SPONGE GAUZE 4X4 12PLY (GAUZE/BANDAGES/DRESSINGS) ×2 IMPLANT
SPONGE LAP 18X18 X RAY DECT (DISPOSABLE) ×2 IMPLANT
STOCKINETTE IMPERVIOUS 9X36 MD (GAUZE/BANDAGES/DRESSINGS) ×2 IMPLANT
TISSUE THERASKIN 2X3 (Tissue) ×8 IMPLANT
TOWEL OR 17X24 6PK STRL BLUE (TOWEL DISPOSABLE) ×2 IMPLANT
TOWEL OR 17X26 10 PK STRL BLUE (TOWEL DISPOSABLE) ×2 IMPLANT
TUBE ANAEROBIC SPECIMEN COL (MISCELLANEOUS) IMPLANT
TUBE CONNECTING 12X1/4 (SUCTIONS) ×2 IMPLANT
UNDERPAD 30X30 INCONTINENT (UNDERPADS AND DIAPERS) IMPLANT
WATER STERILE IRR 1000ML POUR (IV SOLUTION) IMPLANT
YANKAUER SUCT BULB TIP NO VENT (SUCTIONS) ×2 IMPLANT

## 2012-11-06 NOTE — Anesthesia Procedure Notes (Signed)
Procedure Name: LMA Insertion Date/Time: 11/06/2012 4:17 PM Performed by: Gayla Medicus Pre-anesthesia Checklist: Patient identified, Timeout performed, Emergency Drugs available, Patient being monitored and Suction available Patient Re-evaluated:Patient Re-evaluated prior to inductionOxygen Delivery Method: Circle system utilized Preoxygenation: Pre-oxygenation with 100% oxygen Intubation Type: IV induction LMA: LMA inserted LMA Size: 5.0 Number of attempts: 1 Placement Confirmation: positive ETCO2 and breath sounds checked- equal and bilateral Tube secured with: Tape Dental Injury: Teeth and Oropharynx as per pre-operative assessment

## 2012-11-06 NOTE — Progress Notes (Signed)
ANTIBIOTIC CONSULT NOTE - INITIAL  Pharmacy Consult for vancomycin Indication: left lower leg ulcer  No Known Allergies  Patient Measurements: Height: 5\' 10"  (177.8 cm) Weight: 272 lb 5 oz (123.52 kg) IBW/kg (Calculated) : 73  Vital Signs: Temp: 97.6 F (36.4 C) (07/18 1850) Temp src: Oral (07/18 1850) BP: 141/57 mmHg (07/18 1850) Pulse Rate: 73 (07/18 1850) Intake/Output from previous day:   Intake/Output from this shift:    Labs:  Recent Labs  11/06/12 1134  WBC 6.2  HGB 12.8*  PLT 167  CREATININE 0.94   Estimated Creatinine Clearance: 108.8 ml/min (by C-G formula based on Cr of 0.94). No results found for this basename: VANCOTROUGH, Leodis Binet, VANCORANDOM, GENTTROUGH, GENTPEAK, GENTRANDOM, TOBRATROUGH, TOBRAPEAK, TOBRARND, AMIKACINPEAK, AMIKACINTROU, AMIKACIN,  in the last 72 hours   Microbiology: Recent Results (from the past 720 hour(s))  SURGICAL PCR SCREEN     Status: None   Collection Time    11/06/12 12:07 PM      Result Value Range Status   MRSA, PCR NEGATIVE  NEGATIVE Final   Staphylococcus aureus NEGATIVE  NEGATIVE Final   Comment:            The Xpert SA Assay (FDA     approved for NASAL specimens     in patients over 49 years of age),     is one component of     a comprehensive surveillance     program.  Test performance has     been validated by [employer] for patients greater     than or equal to 45 year old.     It is not intended     to diagnose infection nor to     guide or monitor treatment.    Medical History: Past Medical History  Diagnosis Date  . Diabetes mellitus   . GERD (gastroesophageal reflux disease)   . Asthma   . Shortness of breath   . Leg pain   . Peripheral vascular disease   . Sleep apnea     uses bipap, sleep study done in [place] 2 years ago, dr Cala Bradford byrd  . Anemia   . Headache(784.0)     sinus headaches  . COPD (chronic obstructive pulmonary disease)     3L continuous  . Peripheral neuropathy    . Cancer     hx basal cell skin cancer  . Psoriasis   . 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 CHF (congestive heart failure)     does not see a cardiologist    Medications:  Prescriptions prior to admission  Medication Sig Dispense Refill  . celecoxib (CELEBREX) 200 MG capsule Take 200 mg by mouth daily.      [name] doxycycline (VIBRA-TABS) 100 MG tablet Take 100 mg by mouth 2 (two) times daily. 30 day course started 10/26/12      . DULoxetine (CYMBALTA) 30 MG capsule Take 30 mg by mouth daily.      . fentaNYL (DURAGESIC - DOSED MCG/HR) 25 MCG/HR Place 1 patch onto the skin every 3 (three) days.      . ferrous sulfate 325 (65 FE) MG tablet Take 325 mg by mouth 3 (three) times daily with meals.       . Fluticasone-Salmeterol (ADVAIR) 250-50 MCG/DOSE AEPB Inhale 1 puff into the lungs daily.      . furosemide (LASIX) 40 MG tablet Take 40 mg by mouth daily.       [name]  montelukast (SINGULAIR) 10 MG tablet Take 10 mg by mouth at bedtime.       [name] oxyCODONE (OXY IR/ROXICODONE) 5 MG immediate release tablet Take 5 mg by mouth 5 (five) times daily as needed for pain.       . potassium chloride SA (K-DUR,KLOR-CON) 20 MEQ tablet Take 20 mEq by mouth daily.      . pravastatin (PRAVACHOL) 10 MG tablet Take 10 mg by mouth at bedtime.       . topiramate (TOPAMAX) 100 MG tablet Take 100 mg by mouth 2 (two) times daily. Also takes 50 mg daily at noon      . topiramate (TOPAMAX) 50 MG tablet Take 50 mg by mouth daily with lunch. Also takes 100 mg morning and evening      . Vitamin D, Ergocalciferol, (DRISDOL) 50000 UNITS CAPS Take 50,000 Units by mouth every 7 (seven) days. Sunday      . warfarin (COUMADIN) 5 MG tablet Take 7.5 mg by mouth daily.      [name] albuterol (PROVENTIL) (2.5 MG/3ML) 0.083% nebulizer solution Take 2.5 mg by nebulization daily as needed for wheezing or shortness of breath.       . levalbuterol (XOPENEX HFA) 45 MCG/ACT inhaler Inhale 2 puffs into the lungs daily as needed for wheezing  or shortness of breath.        Assessment: 62 y/o male with a chronic left lower leg ulcer s/p I&D and placement of wound VAC. Pharmacy consulted to continue vancomycin post-op for 5 days. Renal function is normal at baseline. Ancef 2 g IV given pre-op at 16:25. MRSA and Staph PCR were negative.  Goal of Therapy:  Vancomycin trough level 10-15 mcg/ml  Plan:  -Vancomycin 1250 mg IV q12h for 5 days -Monitor renal function and clinical progress  [facility], [address]D., BCPS Clinical Pharmacist Pager: [phone] 11/06/2012 8:07 PM

## 2012-11-06 NOTE — Anesthesia Postprocedure Evaluation (Signed)
Anesthesia Post Note  Patient: Jeremy [name]  Procedure(s) Performed: Procedure(s) (LRB): IRRIGATION AND DEBRIDEMENT EXTREMITY-Left lower  (Left)  Anesthesia type: general  Patient location: PACU  Post pain: Pain level controlled  Post assessment: Patient's Cardiovascular Status Stable  Last Vitals:  Filed Vitals:   11/06/12 1730  BP:   Pulse: 73  Temp:   Resp: 20    Post vital signs: Reviewed and stable  Level of consciousness: sedated  Complications: No apparent anesthesia complications

## 2012-11-06 NOTE — Transfer of Care (Signed)
Immediate Anesthesia Transfer of Care Note  Patient: Jeremy [name]  Procedure(s) Performed: Procedure(s) with comments: IRRIGATION AND DEBRIDEMENT EXTREMITY-Left lower  (Left) - Left Leg Irrigation and Debridement, Place Skin Graft, Wound VAC, [place]in  Patient Location: PACU  Anesthesia Type:General  Level of Consciousness: awake, alert  and oriented  Airway & Oxygen Therapy: Patient Spontanous Breathing and Patient connected to nasal cannula oxygen  Post-op Assessment: Report given to PACU RN, Post -op Vital signs reviewed and stable and Patient moving all extremities X 4  Post vital signs: Reviewed and stable  Complications: No apparent anesthesia complications

## 2012-11-06 NOTE — Progress Notes (Signed)
Patient has CPAP from home.  Patient states that he will set up and administer himself.  Sterile water was provided for humidifier.  Patient is familiar with equipment and procedure.

## 2012-11-06 NOTE — Anesthesia Preprocedure Evaluation (Signed)
Anesthesia Evaluation    Reviewed: Allergy & Precautions, H&P , NPO status , Patient's Chart, lab work & pertinent test results  History of Anesthesia Complications Negative for: history of anesthetic complications  Airway       Dental   Pulmonary shortness of breath, asthma , sleep apnea and Continuous Positive Airway Pressure Ventilation , COPD oxygen dependent, former smoker,          Cardiovascular + Peripheral Vascular Disease and +CHF     Neuro/Psych  Headaches, negative psych ROS   GI/Hepatic Neg liver ROS, GERD-  Medicated,  Endo/Other  diabetes, Insulin Dependent  Renal/GU negative Renal ROS     Musculoskeletal   Abdominal   Peds  Hematology   Anesthesia Other Findings   Reproductive/Obstetrics                           Anesthesia Physical Anesthesia Plan  ASA: III  Anesthesia Plan: General   Post-op Pain Management:    Induction: Intravenous  Airway Management Planned: LMA  Additional Equipment:   Intra-op Plan:   Post-operative Plan: Extubation in OR  Informed Consent:   Plan Discussed with: CRNA, Anesthesiologist and Surgeon  Anesthesia Plan Comments:         Anesthesia Quick Evaluation

## 2012-11-06 NOTE — Preoperative (Signed)
Beta Blockers   Reason not to administer Beta Blockers:Not Applicable 

## 2012-11-06 NOTE — Op Note (Signed)
OPERATIVE REPORT  DATE OF SURGERY: 11/06/2012  PATIENT:  Jeremy [name],  62 y.o. male  PRE-OPERATIVE DIAGNOSIS:  Ulcer Left Lower Leg chronic  POST-OPERATIVE DIAGNOSIS:  Ulcer Left Lower Leg chronic  PROCEDURE:  Procedure(s): IRRIGATION AND DEBRIDEMENT EXTREMITY-Left lower  Excision skin soft tissue muscle and fascia. Application of allograft split thickness skin graft wound area 15 x 10 cm. 4 pieces of skin graft. Application of wound VAC.  SURGEON:  Surgeon(s): Nadara Mustard, MD  ANESTHESIA:   general  EBL:  Minimal ML  SPECIMEN:  No Specimen  TOURNIQUET:  * No tourniquets in log *  PROCEDURE DETAILS: Patient is a 62 year old gentleman with over 30 year ulceration to his left lower extremity. He is undergone multiple treatments he presents at this time with purulent drainage increasing pain increasing odor and presents for the above-mentioned procedure. Risks and benefits were discussed including persistent infection neurovascular injury need for additional surgery potential for loss of limb. Patient states he understands and wished to proceed at this time. Description of procedure patient was brought to the operating room and underwent a general anesthetic. After adequate levels of anesthesia were obtained patient's left lower extremity was prepped using DuraPrep draped into a sterile field and the foot was draped out into an impervious stocking at. Using pulsatile lavage 10 blade knife and a Ronjair the skin soft tissue muscle and fascia was debrided from the wound. This is debrided back to healthy viable granulation tissue which had good bleeding. The allograft split thickness skin graft x4 was then applied held in place with staples. This is covered with the Mepitel dressing followed by a wound VAC this was secured with Ioban drape set to -75 mm of suction fashion. This had a good suction fit. Patient was extubated taken to the PACU in stable condition.  PLAN OF CARE: Admit to  inpatient   PATIENT DISPOSITION:  PACU - hemodynamically stable.   Nadara Mustard, MD 11/06/2012 4:59 PM

## 2012-11-06 NOTE — H&P (Signed)
Jeremy [name] is an 62 y.o. male.   Chief Complaint: Chronic ulceration left leg with infection HPI: Patient is a 62 year old gentleman who presents with worsening infection drainage pain ulceration of the left leg. Patient is failed prolonged conservative therapy.  Past Medical History  Diagnosis Date  . Diabetes mellitus   . GERD (gastroesophageal reflux disease)   . Asthma   . Shortness of breath   . Leg pain   . Peripheral vascular disease   . Sleep apnea     uses bipap, sleep study done in [place] 2 years ago, dr Cala Bradford byrd  . Anemia   . Headache(784.0)     sinus headaches  . COPD (chronic obstructive pulmonary disease)     3L continuous  . Peripheral neuropathy   . Cancer     hx basal cell skin cancer  . Psoriasis   . 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 CHF (congestive heart failure)     does not see a cardiologist    Past Surgical History  Procedure Laterality Date  . Arm surgery    . Vein ligation and stripping    . Tummy tuck    . Stomach stapling    . Skin grafts    . I&d extremity  03/13/2011    Procedure: IRRIGATION AND DEBRIDEMENT EXTREMITY;  Surgeon: Nadara Mustard, MD;  Location: [facility] OR;  Service: Orthopedics;  Laterality: Left;  . Skin split graft  03/13/2012    Procedure: SKIN GRAFT SPLIT THICKNESS;  Surgeon: Nadara Mustard, MD;  Location: [facility] OR;  Service: Orthopedics;  Laterality: Left;  Excisional Debridement Left Leg, Split Thickness Skin Graft, Wound VAC  . Back stimulator    . Apligraft placement Left 07/31/2012    Procedure: Apply Skin Graft;  Surgeon: Nadara Mustard, MD;  Location: [facility] OR;  Service: Orthopedics;  Laterality: Left;  . Application of wound vac Left 07/31/2012    Procedure: APPLICATION OF WOUND VAC;  Surgeon: Nadara Mustard, MD;  Location: [facility] OR;  Service: Orthopedics;  Laterality: Left;  . Cardiac catheterization      Hx: of " around 1985 at [facility]"  . Cyst excision      Hx: of    Family History  Problem Relation Age of  Onset  . Heart disease Brother    Social History:  reports that he quit smoking about 21 years ago. His smoking use included Cigarettes. He has a 3 pack-year smoking history. He has never used smokeless tobacco. He reports that  drinks alcohol. He reports that he does not use illicit drugs.  Allergies: No Known Allergies  Medications Prior to Admission  Medication Sig Dispense Refill  . albuterol (PROVENTIL) (2.5 MG/3ML) 0.083% nebulizer solution Take 2.5 mg by nebulization daily as needed for wheezing or shortness of breath.       . celecoxib (CELEBREX) 200 MG capsule Take 200 mg by mouth daily.      [name] doxycycline (VIBRA-TABS) 100 MG tablet Take 100 mg by mouth 2 (two) times daily. 30 day course started 10/26/12      . DULoxetine (CYMBALTA) 30 MG capsule Take 30 mg by mouth daily.      . fentaNYL (DURAGESIC - DOSED MCG/HR) 25 MCG/HR Place 1 patch onto the skin every 3 (three) days.      . ferrous sulfate 325 (65 FE) MG tablet Take 325 mg by mouth 3 (three) times daily with meals.       [name]  Fluticasone-Salmeterol (ADVAIR) 250-50 MCG/DOSE AEPB Inhale 1 puff into the lungs daily.      . furosemide (LASIX) 40 MG tablet Take 40 mg by mouth daily.       [name] levalbuterol (XOPENEX HFA) 45 MCG/ACT inhaler Inhale 2 puffs into the lungs daily as needed for wheezing or shortness of breath.       . montelukast (SINGULAIR) 10 MG tablet Take 10 mg by mouth at bedtime.       [name] oxyCODONE (OXY IR/ROXICODONE) 5 MG immediate release tablet Take 5 mg by mouth 5 (five) times daily as needed for pain.       . potassium chloride SA (K-DUR,KLOR-CON) 20 MEQ tablet Take 20 mEq by mouth daily.      . pravastatin (PRAVACHOL) 10 MG tablet Take 10 mg by mouth at bedtime.       . topiramate (TOPAMAX) 100 MG tablet Take 100 mg by mouth 2 (two) times daily. Also takes 50 mg daily at noon      . topiramate (TOPAMAX) 50 MG tablet Take 50 mg by mouth daily with lunch. Also takes 100 mg morning and evening      . Vitamin D,  Ergocalciferol, (DRISDOL) 50000 UNITS CAPS Take 50,000 Units by mouth every 7 (seven) days. Sunday      . warfarin (COUMADIN) 5 MG tablet Take 7.5 mg by mouth daily.        No results found for this or any previous visit (from the past 48 hour(s)). No results found.  Review of Systems  All other systems reviewed and are negative.    Height 5\' 10"  (1.778 m), weight 124.739 kg (275 lb). Physical Exam  On examination patient has worsening of the cellulitis drainage pain and increased size of the wound left lower extremity. Assessment/Plan Assessment cellulitis ulceration abscess left leg.  Plan: We'll plan for a left leg excisional debridement placement of allograft skin graft placement of a wound VAC placement of PICC line for prolonged antibiotics. Risks and benefits were discussed including potential for transtibial amputation. Patient states he understands and wished to proceed at this time.  Arshiya Jakes V 11/06/2012, 11:46 AM

## 2012-11-07 NOTE — Evaluation (Signed)
Physical Therapy Evaluation Patient Details Name: Jeremy [name] MRN: [id number] DOB: [birth year] Today's Date: 11/07/2012 Time: 1308-6578 PT Time Calculation (min): 23 min  PT Assessment / Plan / Recommendation History of Present Illness  62 year old gentleman who presents with worsening infection drainage pain ulceration of the left leg. Patient is failed prolonged conservative therapy  Clinical Impression  Pt s/p I&D, skin graft, and wound vac placement of chronic L lower leg ulceration. Pt currently with functional limitations due to the deficits listed below (see PT Problem List).  Pt will benefit from skilled PT to increase their independence and safety with mobility to allow discharge home with spouse.  Recommend RW however pt declines at this time.     PT Assessment  Patient needs continued PT services    Follow Up Recommendations  No PT follow up    Does the patient have the potential to tolerate intense rehabilitation      Barriers to Discharge        Equipment Recommendations  Rolling walker with 5" wheels (pt declines at this time)    Recommendations for Other Services     Frequency Min 3X/week    Precautions / Restrictions Precautions Precautions: Fall Precaution Comments: chronic O2 Restrictions Other Position/Activity Restrictions: WBAT L LE   Pertinent Vitals/Pain 4/10 L lower leg pain with ambulation, pt states not due for pain meds yet however used call bell to alert nurse, elevated L LE      Mobility  Bed Mobility Bed Mobility: Supine to Sit;Sit to Supine Supine to Sit: 5: Supervision Sit to Supine: 5: Supervision Details for Bed Mobility Assistance: verbal cues for safety with line/vac/O2 management Transfers Transfers: Sit to Stand;Stand to Sit Sit to Stand: 4: Min guard;With upper extremity assist;From bed Stand to Sit: 4: Min guard;With upper extremity assist;To bed Details for Transfer Assistance: verbal cues for safety with line/vac/O2  management Ambulation/Gait Ambulation/Gait Assistance: 4: Min guard Ambulation Distance (Feet): 120 Feet Assistive device: Rolling walker Ambulation/Gait Assistance Details: encouraged UE assist through RW for pain control L LE Gait Pattern: Step-through pattern;Decreased stance time - left;Decreased step length - right;Antalgic Gait velocity: decreased    Exercises     PT Diagnosis: Difficulty walking;Acute pain  PT Problem List: Decreased mobility;Decreased activity tolerance;Pain PT Treatment Interventions: DME instruction;Gait training;Functional mobility training;Therapeutic activities;Therapeutic exercise;Patient/family education     PT Goals(Current goals can be found in the care plan section) Acute Rehab PT Goals Patient Stated Goal: home monday PT Goal Formulation: With patient Time For Goal Achievement: 11/14/12 Potential to Achieve Goals: Good  Visit Information  Last PT Received On: 11/07/12 Assistance Needed: +1 History of Present Illness: 62 year old gentleman who presents with worsening infection drainage pain ulceration of the left leg. Patient is failed prolonged conservative therapy       Prior Functioning  Home Living Family/patient expects to be discharged to:: Private residence Living Arrangements: Spouse/significant other Type of Home: House Home Access: Level entry Home Layout: One level Home Equipment: None Prior Function Level of Independence: Independent Comments: on 3LO2 at home Communication Communication: No difficulties    Cognition  Cognition Arousal/Alertness: Awake/alert Behavior During Therapy: WFL for tasks assessed/performed Overall Cognitive Status: Within Functional Limits for tasks assessed    Extremity/Trunk Assessment Lower Extremity Assessment Lower Extremity Assessment: Overall WFL for tasks assessed   Balance    End of Session PT - End of Session Equipment Utilized During Treatment: Gait belt;Oxygen Activity Tolerance:  Patient tolerated treatment well Patient left: in bed;with call bell/phone  within reach;with family/visitor present  GP     Timmothy Baranowski,KATHrine E 11/07/2012, 1:46 PM Zenovia Jarred, PT, DPT 11/07/2012 Pager: [phone]

## 2012-11-07 NOTE — Progress Notes (Signed)
Patient ID: Jeremy [name], male   DOB: 12-16-1950, 62 y.o.   MRN: [id number] Postoperative day 1 status post split thickness skin graft and debridement chronic wound left leg with placement of wound VAC. Patient is comfortable this morning. Plan for discharge to home with a home wound VAC for approximately 4 weeks.

## 2012-11-08 LAB — PROTIME-INR
INR: 1.6 — ABNORMAL HIGH (ref 0.00–1.49)
Prothrombin Time: 18.6 seconds — ABNORMAL HIGH (ref 11.6–15.2)

## 2012-11-08 NOTE — Progress Notes (Signed)
Patient ID: Jeremy [name], male   DOB: [birth year], 62 y.o.   MRN: [id number] Continue IV vancomycin. Patient's wound bed did not show any significant infection there is no cellulitis there is no dermatitis and do not feel the patient needs to home IV antibiotics. Will discharge on by mouth antibiotics. Will need home wound VAC therapy at discharge will change the VAC sponge tomorrow morning and discharge once the home wound VAC is approved.

## 2012-11-09 ENCOUNTER — Encounter (HOSPITAL_COMMUNITY): Payer: Self-pay | Admitting: Orthopedic Surgery

## 2012-11-09 MED FILL — Mupirocin Oint 2%: CUTANEOUS | Qty: 22 | Status: AC

## 2012-11-09 NOTE — Progress Notes (Signed)
PT Cancellation Note  Patient Details Name: LAMBERT JEANTY MRN: [id number] DOB: 08-Jun-1950   Cancelled Treatment:    Reason Eval/Treat Not Completed: Pain limiting ability to participate (vac removed and changed)   Alaila Pillard, Adline Potter 11/09/2012, 12:00 PM

## 2012-11-09 NOTE — Progress Notes (Signed)
UR COMPLETED  

## 2012-11-09 NOTE — Discharge Summary (Signed)
Physician Discharge Summary  Patient ID: BRAYCEN BURANDT MRN: [id number] DOB/AGE: Aug 26, 1950 62 y.o.  Admit date: 11/06/2012 Discharge date: 11/09/2012  Admission Diagnoses: Chronic venous stasis ulcer left leg  Discharge Diagnoses: Chronic venous stasis ulcer left leg Active Problems:   * No active hospital problems. *   Discharged Condition: stable  Hospital Course: Patient's hospital course was essentially unremarkable. He underwent excisional debridement of a massive venous stasis ulcer left leg. Allograft split thickness skin graft was applied as well as a wound VAC. Patient has had significant amount of drainage and is to be discharged to home with a home wound VAC.  Consults: None  Significant Diagnostic Studies: labs: Routine labs  Treatments: surgery: See operative note  Discharge Exam: Blood pressure 119/52, pulse 65, temperature 98.8 F (37.1 C), temperature source Oral, resp. rate 16, height 5\' 10"  (1.778 m), weight 123.52 kg (272 lb 5 oz), SpO2 100.00%. Incision/Wound: wound clean and dry no signs of cellulitis or infection or drainage.  Disposition: 06-Home-Health Care Svc  Discharge Orders   Future Orders Complete By Expires     Ambulatory referral to Home Health  As directed     Comments:      Please evaluate [name] for admission to [facility].  Disciplines requested: Nursing  Services to provide: Other: Home wound VAC changed Monday Wednesday Friday patient will need a large sponge suction set to -75 mm of mercury high intensity.  Physician to follow patient's care (the person listed here will be responsible for signing ongoing orders): Referring Provider  Requested Start of Care Date: Tomorrow  Special Instructions:  Do not remove Mepilex dressing on the wound.    Call MD / Call 911  As directed     Comments:      If you experience chest pain or shortness of breath, CALL 911 and be transported to the hospital emergency room.  If you develope a fever  above 101 F, pus (white drainage) or increased drainage or redness at the wound, or calf pain, call your surgeon's office.    Constipation Prevention  As directed     Comments:      Drink plenty of fluids.  Prune juice may be helpful.  You may use a stool softener, such as Colace (over the counter) 100 mg twice a day.  Use MiraLax (over the counter) for constipation as needed.    Diet - low sodium heart healthy  As directed     Increase activity slowly as tolerated  As directed         Medication List         albuterol (2.5 MG/3ML) 0.083% nebulizer solution  Commonly known as:  PROVENTIL  Take 2.5 mg by nebulization daily as needed for wheezing or shortness of breath.     celecoxib 200 MG capsule  Commonly known as:  CELEBREX  Take 200 mg by mouth daily.     doxycycline 100 MG tablet  Commonly known as:  VIBRA-TABS  Take 100 mg by mouth 2 (two) times daily. 30 day course started 10/26/12     DULoxetine 30 MG capsule  Commonly known as:  CYMBALTA  Take 30 mg by mouth daily.     fentaNYL 25 MCG/HR  Commonly known as:  DURAGESIC - dosed mcg/hr  Place 1 patch onto the skin every 3 (three) days.     ferrous sulfate 325 (65 FE) MG tablet  Take 325 mg by mouth 3 (three) times daily with meals.  Fluticasone-Salmeterol 250-50 MCG/DOSE Aepb  Commonly known as:  ADVAIR  Inhale 1 puff into the lungs daily.     furosemide 40 MG tablet  Commonly known as:  LASIX  Take 40 mg by mouth daily.     montelukast 10 MG tablet  Commonly known as:  SINGULAIR  Take 10 mg by mouth at bedtime.     oxyCODONE 5 MG immediate release tablet  Commonly known as:  Oxy IR/ROXICODONE  Take 5 mg by mouth 5 (five) times daily as needed for pain.     potassium chloride SA 20 MEQ tablet  Commonly known as:  K-DUR,KLOR-CON  Take 20 mEq by mouth daily.     pravastatin 10 MG tablet  Commonly known as:  PRAVACHOL  Take 10 mg by mouth at bedtime.     topiramate 50 MG tablet  Commonly known as:   TOPAMAX  Take 50 mg by mouth daily with lunch. Also takes 100 mg morning and evening     topiramate 100 MG tablet  Commonly known as:  TOPAMAX  Take 100 mg by mouth 2 (two) times daily. Also takes 50 mg daily at noon     Vitamin D (Ergocalciferol) 50000 UNITS Caps  Commonly known as:  DRISDOL  Take 50,000 Units by mouth every 7 (seven) days. Sunday     warfarin 5 MG tablet  Commonly known as:  COUMADIN  Take 7.5 mg by mouth daily.     XOPENEX HFA 45 MCG/ACT inhaler  Generic drug:  levalbuterol  Inhale 2 puffs into the lungs daily as needed for wheezing or shortness of breath.           Follow-up Information   Follow up with DUDA,MARCUS V, MD In 2 weeks.   Contact information:   [address] [place] [address] [phone]       Signed: Nadara Mustard 11/09/2012, 6:56 AM

## 2012-11-09 NOTE — Progress Notes (Signed)
Patients home wound vac did not arrive until 1800. Patient's wife uncomfortable with driving home to [place] in the dark with the bad weather, so patient is waiting until the morning to be discharged home. All discharge instructions ready and home heath set up.

## 2012-11-09 NOTE — Progress Notes (Signed)
11/09/12 Completed VAC form, spoke with [employer] rep Rickie,faxed form, demographic sheet, op note, H and P, d/c summary as well as H and P and CV note from 07/31/12, to [phone]. Contacted Rickie and confirmed receipt of information.Awaiting notification of approval for VAC. Spoke with patient and his wife about HHC. They chose [facility]. Contacted [facility] [phone] with Misty, set up [facility] for [facility] care, faxed demographics, order for [facility], face to face, H and P and discharge summary to [phone]. [facility] and confirmed info received. Continuing to follow for d/c needs. Jacquelynn Cree RN, BSN, CCM

## 2012-11-10 LAB — PROTIME-INR: Prothrombin Time: 18.8 seconds — ABNORMAL HIGH (ref 11.6–15.2)

## 2012-11-10 NOTE — Discharge Summary (Signed)
  Discharge delayed do to delay in obtaining wound VAC. Plan for discharge today no change in condition discharged to home in stable condition chronic ulceration left lower extremity with allograft skin graft and wound VAC in place.

## 2012-11-10 NOTE — Progress Notes (Signed)
Discharge instructions provided and follow up appointment information given. Patient provided with [employer] wound vac and educated on the vac. He will have home health RN coming to his home to do dressing changes. He is going home to [place] with his wife at this time.

## 2012-11-16 ENCOUNTER — Inpatient Hospital Stay (HOSPITAL_COMMUNITY)
Admission: EM | Admit: 2012-11-16 | Discharge: 2012-11-20 | DRG: 603 | Disposition: A | Discharge status: Home Health | Payer: Medicare Other | Attending: Internal Medicine | Admitting: Internal Medicine

## 2012-11-16 ENCOUNTER — Encounter (HOSPITAL_COMMUNITY): Payer: Self-pay | Admitting: Emergency Medicine

## 2012-11-16 ENCOUNTER — Inpatient Hospital Stay (HOSPITAL_COMMUNITY): Payer: Medicare Other

## 2012-11-16 DIAGNOSIS — J449 Chronic obstructive pulmonary disease, unspecified: Secondary | ICD-10-CM | POA: Diagnosis present

## 2012-11-16 DIAGNOSIS — Z86718 Personal history of other venous thrombosis and embolism: Secondary | ICD-10-CM

## 2012-11-16 DIAGNOSIS — I739 Peripheral vascular disease, unspecified: Secondary | ICD-10-CM | POA: Diagnosis present

## 2012-11-16 DIAGNOSIS — D649 Anemia, unspecified: Secondary | ICD-10-CM | POA: Diagnosis present

## 2012-11-16 DIAGNOSIS — G894 Chronic pain syndrome: Secondary | ICD-10-CM | POA: Diagnosis present

## 2012-11-16 DIAGNOSIS — E876 Hypokalemia: Secondary | ICD-10-CM | POA: Diagnosis present

## 2012-11-16 DIAGNOSIS — K219 Gastro-esophageal reflux disease without esophagitis: Secondary | ICD-10-CM | POA: Diagnosis present

## 2012-11-16 DIAGNOSIS — T502X5A Adverse effect of carbonic-anhydrase inhibitors, benzothiadiazides and other diuretics, initial encounter: Secondary | ICD-10-CM | POA: Diagnosis not present

## 2012-11-16 DIAGNOSIS — Z7901 Long term (current) use of anticoagulants: Secondary | ICD-10-CM

## 2012-11-16 DIAGNOSIS — L02419 Cutaneous abscess of limb, unspecified: Principal | ICD-10-CM | POA: Diagnosis present

## 2012-11-16 DIAGNOSIS — E119 Type 2 diabetes mellitus without complications: Secondary | ICD-10-CM

## 2012-11-16 DIAGNOSIS — L0390 Cellulitis, unspecified: Secondary | ICD-10-CM | POA: Diagnosis present

## 2012-11-16 DIAGNOSIS — J4489 Other specified chronic obstructive pulmonary disease: Secondary | ICD-10-CM | POA: Diagnosis present

## 2012-11-16 DIAGNOSIS — G4733 Obstructive sleep apnea (adult) (pediatric): Secondary | ICD-10-CM | POA: Diagnosis present

## 2012-11-16 DIAGNOSIS — I82409 Acute embolism and thrombosis of unspecified deep veins of unspecified lower extremity: Secondary | ICD-10-CM | POA: Diagnosis present

## 2012-11-16 DIAGNOSIS — E1159 Type 2 diabetes mellitus with other circulatory complications: Secondary | ICD-10-CM | POA: Diagnosis present

## 2012-11-16 DIAGNOSIS — R7881 Bacteremia: Secondary | ICD-10-CM | POA: Diagnosis present

## 2012-11-16 DIAGNOSIS — L03116 Cellulitis of left lower limb: Secondary | ICD-10-CM

## 2012-11-16 DIAGNOSIS — B965 Pseudomonas (aeruginosa) (mallei) (pseudomallei) as the cause of diseases classified elsewhere: Secondary | ICD-10-CM | POA: Diagnosis present

## 2012-11-16 DIAGNOSIS — Z87891 Personal history of nicotine dependence: Secondary | ICD-10-CM

## 2012-11-16 DIAGNOSIS — L409 Psoriasis, unspecified: Secondary | ICD-10-CM | POA: Diagnosis present

## 2012-11-16 DIAGNOSIS — E1151 Type 2 diabetes mellitus with diabetic peripheral angiopathy without gangrene: Secondary | ICD-10-CM | POA: Diagnosis present

## 2012-11-16 DIAGNOSIS — I509 Heart failure, unspecified: Secondary | ICD-10-CM | POA: Diagnosis present

## 2012-11-16 DIAGNOSIS — Z85828 Personal history of other malignant neoplasm of skin: Secondary | ICD-10-CM

## 2012-11-16 DIAGNOSIS — I798 Other disorders of arteries, arterioles and capillaries in diseases classified elsewhere: Secondary | ICD-10-CM | POA: Diagnosis present

## 2012-11-16 DIAGNOSIS — L408 Other psoriasis: Secondary | ICD-10-CM | POA: Diagnosis present

## 2012-11-16 DIAGNOSIS — L97909 Non-pressure chronic ulcer of unspecified part of unspecified lower leg with unspecified severity: Secondary | ICD-10-CM | POA: Diagnosis present

## 2012-11-16 LAB — PROTIME-INR
INR: 1.45 (ref 0.00–1.49)
INR: 1.54 — ABNORMAL HIGH (ref 0.00–1.49)
Prothrombin Time: 17.3 seconds — ABNORMAL HIGH (ref 11.6–15.2)

## 2012-11-16 LAB — BASIC METABOLIC PANEL
BUN: 20 mg/dL (ref 6–23)
CO2: 25 mEq/L (ref 19–32)
Calcium: 8.7 mg/dL (ref 8.4–10.5)
Chloride: 107 mEq/L (ref 96–112)
Creatinine, Ser: 1.1 mg/dL (ref 0.50–1.35)
GFR calc Af Amer: 82 mL/min — ABNORMAL LOW (ref >90)
GFR calc non Af Amer: 71 mL/min — ABNORMAL LOW (ref >90)
Glucose, Bld: 185 mg/dL — ABNORMAL HIGH (ref 70–99)
Potassium: 3.3 mEq/L — ABNORMAL LOW (ref 3.5–5.1)
Sodium: 140 mEq/L (ref 135–145)

## 2012-11-16 LAB — GLUCOSE, CAPILLARY: Glucose-Capillary: 246 mg/dL — ABNORMAL HIGH (ref 70–99)

## 2012-11-16 LAB — CBC WITH DIFFERENTIAL/PLATELET
Basophils Absolute: 0 10*3/uL (ref 0.0–0.1)
Basophils Relative: 1 % (ref 0–1)
Eosinophils Absolute: 0.2 10*3/uL (ref 0.0–0.7)
Eosinophils Relative: 3 % (ref 0–5)
HCT: 35.5 % — ABNORMAL LOW (ref 39.0–52.0)
Hemoglobin: 12.4 g/dL — ABNORMAL LOW (ref 13.0–17.0)
Lymphocytes Relative: 17 % (ref 12–46)
Lymphs Abs: 1.3 10*3/uL (ref 0.7–4.0)
MCH: 32.9 pg (ref 26.0–34.0)
MCHC: 34.9 g/dL (ref 30.0–36.0)
MCV: 94.2 fL (ref 78.0–100.0)
Monocytes Absolute: 1 10*3/uL (ref 0.1–1.0)
Monocytes Relative: 13 % — ABNORMAL HIGH (ref 3–12)
Neutro Abs: 5 10*3/uL (ref 1.7–7.7)
Neutrophils Relative %: 67 % (ref 43–77)
Platelets: 219 10*3/uL (ref 150–400)
RBC: 3.77 MIL/uL — ABNORMAL LOW (ref 4.22–5.81)
RDW: 13.7 % (ref 11.5–15.5)
WBC: 7.5 10*3/uL (ref 4.0–10.5)

## 2012-11-16 LAB — TROPONIN I: Troponin I: <0.3 ng/mL (ref <0.30)

## 2012-11-16 MED ORDER — VANCOMYCIN HCL IN DEXTROSE 1-5 GM/200ML-% IV SOLN
1000.0000 mg | Freq: Once | INTRAVENOUS | Status: AC
  Administered 2012-11-16: 1000 mg via INTRAVENOUS
  Filled 2012-11-16: qty 200

## 2012-11-16 MED ORDER — HYDROMORPHONE HCL PF 1 MG/ML IJ SOLN
1.0000 mg | Freq: Once | INTRAMUSCULAR | Status: AC
  Administered 2012-11-16: 1 mg via INTRAVENOUS
  Filled 2012-11-16: qty 1

## 2012-11-16 MED ORDER — ACETAMINOPHEN 325 MG PO TABS
650.0000 mg | ORAL_TABLET | Freq: Four times a day (QID) | ORAL | Status: DC | PRN
  Administered 2012-11-18: 650 mg via ORAL
  Filled 2012-11-16 (×2): qty 2

## 2012-11-16 MED ORDER — IOHEXOL 300 MG/ML  SOLN
100.0000 mL | Freq: Once | INTRAMUSCULAR | Status: AC | PRN
  Administered 2012-11-16: 100 mL via INTRAVENOUS

## 2012-11-16 MED ORDER — ONDANSETRON HCL 4 MG PO TABS: 4.0000 mg | ORAL_TABLET | Freq: Four times a day (QID) | ORAL | Status: DC | PRN

## 2012-11-16 MED ORDER — ACETAMINOPHEN 650 MG RE SUPP: 650.0000 mg | Freq: Four times a day (QID) | RECTAL | Status: DC | PRN

## 2012-11-16 MED ORDER — OXYCODONE HCL 5 MG PO TABS
5.0000 mg | ORAL_TABLET | ORAL | Status: DC | PRN
  Administered 2012-11-16 – 2012-11-20 (×19): 5 mg via ORAL
  Filled 2012-11-16 (×20): qty 1

## 2012-11-16 MED ORDER — HYDROMORPHONE HCL PF 1 MG/ML IJ SOLN
1.0000 mg | INTRAMUSCULAR | Status: DC | PRN
  Administered 2012-11-16 – 2012-11-18 (×11): 1 mg via INTRAVENOUS
  Filled 2012-11-16 (×12): qty 1

## 2012-11-16 MED ORDER — LEVALBUTEROL HCL 0.63 MG/3ML IN NEBU
0.6300 mg | INHALATION_SOLUTION | Freq: Four times a day (QID) | RESPIRATORY_TRACT | Status: DC | PRN
  Filled 2012-11-16: qty 3

## 2012-11-16 MED ORDER — SODIUM CHLORIDE 0.9 % IV SOLN
500.0000 mg | Freq: Four times a day (QID) | INTRAVENOUS | Status: DC
  Administered 2012-11-16 – 2012-11-19 (×9): 500 mg via INTRAVENOUS
  Filled 2012-11-16 (×15): qty 500

## 2012-11-16 MED ORDER — SODIUM CHLORIDE 0.9 % IV SOLN
500.0000 mg | INTRAVENOUS | Status: AC
  Administered 2012-11-16: 500 mg via INTRAVENOUS
  Filled 2012-11-16: qty 500

## 2012-11-16 MED ORDER — INSULIN ASPART 100 UNIT/ML ~~LOC~~ SOLN
0.0000 [IU] | Freq: Three times a day (TID) | SUBCUTANEOUS | Status: DC
  Administered 2012-11-17: 1 [IU] via SUBCUTANEOUS
  Administered 2012-11-18: 2 [IU] via SUBCUTANEOUS
  Administered 2012-11-19 (×2): 1 [IU] via SUBCUTANEOUS

## 2012-11-16 MED ORDER — SODIUM CHLORIDE 0.9 % IJ SOLN
3.0000 mL | Freq: Two times a day (BID) | INTRAMUSCULAR | Status: DC
  Administered 2012-11-17 – 2012-11-18 (×2): 3 mL via INTRAVENOUS

## 2012-11-16 MED ORDER — ONDANSETRON HCL 4 MG/2ML IJ SOLN: 4.0000 mg | Freq: Four times a day (QID) | INTRAMUSCULAR | Status: DC | PRN

## 2012-11-16 MED ORDER — SODIUM CHLORIDE 0.9 % IV SOLN
INTRAVENOUS | Status: DC
  Administered 2012-11-18 – 2012-11-19 (×2): via INTRAVENOUS

## 2012-11-16 NOTE — Progress Notes (Signed)
ANTIBIOTIC CONSULT NOTE - INITIAL  Pharmacy Consult for Primaxin Indication: empiric coverage for suspected skin graft infection  No Known Allergies  Patient Measurements: weight ~123 kg   Vital Signs: Temp: 97 F (36.1 C) (07/28 1000) Temp src: Oral (07/28 1000) BP: 117/64 mmHg (07/28 1347) Pulse Rate: 60 (07/28 1347) Intake/Output from previous day:   Intake/Output from this shift:    Labs:  Recent Labs  11/16/12 1045  WBC 7.5  HGB 12.4*  PLT 219  CREATININE 1.10   The CrCl is unknown because both a height and weight (above a minimum accepted value) are required for this calculation. No results found for this basename: VANCOTROUGH, Leodis Binet, VANCORANDOM, GENTTROUGH, GENTPEAK, GENTRANDOM, TOBRATROUGH, TOBRAPEAK, TOBRARND, AMIKACINPEAK, AMIKACINTROU, AMIKACIN,  in the last 72 hours   Microbiology: Recent Results (from the past 720 hour(s))  SURGICAL PCR SCREEN     Status: None   Collection Time    11/06/12 12:07 PM      Result Value Range Status   MRSA, PCR NEGATIVE  NEGATIVE Final   Staphylococcus aureus NEGATIVE  NEGATIVE Final   Comment:            The Xpert SA Assay (FDA     approved for NASAL specimens     in patients over 40 years of age),     is one component of     a comprehensive surveillance     program.  Test performance has     been validated by [employer] for patients greater     than or equal to 41 year old.     It is not intended     to diagnose infection nor to     guide or monitor treatment.    Medical History: Past Medical History  Diagnosis Date  . Diabetes mellitus   . GERD (gastroesophageal reflux disease)   . Asthma   . Shortness of breath   . Leg pain   . Peripheral vascular disease   . Sleep apnea     uses bipap, sleep study done in [place] 2 years ago, dr Cala Bradford byrd  . Anemia   . Headache(784.0)     sinus headaches  . COPD (chronic obstructive pulmonary disease)     3L continuous  . Peripheral neuropathy   .  Cancer     hx basal cell skin cancer  . Psoriasis   . 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 CHF (congestive heart failure)     does not see a cardiologist    Medications:  See med rec   Assessment: Patient is a 62 y.o M with chronic venous stasis ulcer of left leg-- s/p debridement and skin graft on 7/18. He was discharged home about a week ago with wound VAC and doxycycline (for 30 day course, started on 10/26/12). He presented to the ED today with c/o of leg pain from skin graft/wound site.  He received vancomycin 1gm in the ED at ~ 3PM.  To start primaxin for broad coverage.  Patient has no hx of seizures, on topamax at home for pain.  Plan:  1) primaxin 500mg  IV q6h  Christie Copley P 11/16/2012,3:40 PM

## 2012-11-16 NOTE — H&P (Signed)
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  The patient had an ulcer on the left lower extremity and status post irrigation and debridement and application of  allograft split thickness skin graft wound area 15 x 10 cm, on 7/18. The patient was discharged on 7/22 by mouth doxycycline. He was discharged with home health.. Patient comes in today with left leg pain, home health noted increased redness, no fever no chills, and denies any other cardiopulmonary symptoms       Review of Systems: negative for the following  Constitutional: Denies fever, chills, diaphoresis, appetite change and fatigue.  HEENT: Denies photophobia, eye pain, redness, hearing loss, ear pain, congestion, sore throat, rhinorrhea, sneezing, mouth sores, trouble swallowing, neck pain, neck stiffness and tinnitus.  Respiratory: Denies SOB, DOE, cough, chest tightness, and wheezing.  Cardiovascular: Denies chest pain, palpitations and leg swelling.  Gastrointestinal: Denies nausea, vomiting, abdominal pain, diarrhea, constipation, blood in stool and abdominal distention.  Genitourinary: Denies dysuria, urgency, frequency, hematuria, flank pain and difficulty urinating.  Musculoskeletal: Denies myalgias, back pain, joint swelling, arthralgias and gait problem.  Skin: Erythema and redness of the left lower extremity Neurological: Denies dizziness, seizures, syncope, weakness, light-headedness, numbness and headaches.  Hematological: Denies adenopathy. Easy bruising, personal or family bleeding history  Psychiatric/Behavioral: Denies suicidal ideation, mood changes, confusion, nervousness, sleep  disturbance and agitation       Past Medical History  Diagnosis Date  . Diabetes mellitus   . GERD (gastroesophageal reflux disease)   . Asthma   . Shortness of breath   . Leg pain   . Peripheral vascular disease   . Sleep apnea     uses bipap, sleep study done in [place] 2 years ago, dr Cala Bradford byrd  . Anemia   . Headache(784.0)     sinus headaches  . COPD (chronic obstructive pulmonary disease)     3L continuous  . Peripheral neuropathy   . Cancer     hx basal cell skin cancer  . Psoriasis   . 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 CHF (congestive heart failure)     does not see a cardiologist     Past Surgical History  Procedure Laterality Date  . Arm surgery    . Vein ligation and stripping    . Tummy tuck    . Stomach stapling    . Skin grafts    . I&d extremity  03/13/2011    Procedure: IRRIGATION AND DEBRIDEMENT EXTREMITY;  Surgeon: Nadara Mustard, MD;  Location: [facility] OR;  Service: Orthopedics;  Laterality: Left;  . Skin split graft  03/13/2012    Procedure: SKIN GRAFT SPLIT THICKNESS;  Surgeon: Nadara Mustard, MD;  Location: [facility] OR;  Service: Orthopedics;  Laterality: Left;  Excisional Debridement Left Leg, Split Thickness Skin Graft, Wound VAC  . Back stimulator    . Apligraft placement Left 07/31/2012    Procedure: Apply Skin Graft;  Surgeon: Nadara Mustard, MD;  Location: [facility] OR;  Service: Orthopedics;  Laterality: Left;  . Application of wound vac Left 07/31/2012    Procedure: APPLICATION OF WOUND VAC;  Surgeon: Nadara Mustard, MD;  Location: [facility] OR;  Service: Orthopedics;  Laterality: Left;  . Cardiac catheterization      Hx: of " around 1985 at [facility]"  . Cyst excision      Hx: of  . I&d extremity Left 11/06/2012    Procedure: IRRIGATION AND DEBRIDEMENT EXTREMITY-Left lower ;  Surgeon: Nadara Mustard, MD;  Location: [facility] OR;  Service: Orthopedics;  Laterality: Left;  Left Leg Irrigation and Debridement, Place Skin Graft, Wound VAC, [place]       Social History:  reports that he quit smoking about 21 years ago. His smoking use included Cigarettes. He has a 3 pack-year smoking history. He has never used smokeless tobacco. He reports that  drinks alcohol. He reports that he does not use illicit drugs.    No Known Allergies  Family History  Problem Relation Age of Onset  . Heart disease Brother      Prior to Admission medications   Medication Sig Start Date End Date Taking? Authorizing Provider  celecoxib (CELEBREX) 200 MG capsule Take 200 mg by mouth daily.   Yes Historical Provider, MD  doxycycline (VIBRA-TABS) 100 MG tablet Take 100 mg by mouth 2 (two) times daily. 30 day course started 10/26/12   Yes Historical Provider, MD  DULoxetine (CYMBALTA) 30 MG capsule Take 30 mg by mouth daily.   Yes Historical Provider, MD  fentaNYL (DURAGESIC - DOSED MCG/HR) 25 MCG/HR Place 1 patch onto the skin every 3 (three) days.   Yes Historical Provider, MD  ferrous sulfate 325 (65 FE) MG tablet Take 325 mg by mouth 3 (three) times daily with meals.    Yes Historical Provider, MD  Fluticasone-Salmeterol (ADVAIR) 250-50 MCG/DOSE AEPB Inhale 1 puff into the lungs daily.   Yes Historical Provider, MD  furosemide (LASIX) 40 MG tablet Take 40 mg by mouth daily.    Yes Historical Provider, MD  levalbuterol [facility] HFA) 45 MCG/ACT inhaler Inhale 2 puffs into the lungs daily as needed for wheezing or shortness of breath.    Yes Historical Provider, MD  montelukast (SINGULAIR) 10 MG tablet Take 10 mg by mouth at bedtime.    Yes Historical Provider, MD  oxyCODONE (OXY IR/ROXICODONE) 5 MG immediate release tablet Take 5 mg by mouth 5 (five) times daily as needed for pain.    Yes Historical Provider, MD  potassium chloride SA (K-DUR,KLOR-CON) 20 MEQ tablet Take 20 mEq by mouth daily.   Yes Historical Provider, MD  pravastatin (PRAVACHOL) 10 MG tablet Take 10 mg by mouth at bedtime.    Yes Historical Provider, MD  topiramate (TOPAMAX) 100 MG tablet Take  100 mg by mouth 2 (two) times daily. Also takes 50 mg daily at noon   Yes Historical Provider, MD  Vitamin D, Ergocalciferol, (DRISDOL) 50000 UNITS CAPS Take 50,000 Units by mouth every 7 (seven) days. Sunday   Yes Historical Provider, MD  warfarin (COUMADIN) 5 MG tablet Take 7.5 mg by mouth daily.   Yes Historical Provider, MD  albuterol (PROVENTIL) (2.5 MG/3ML) 0.083% nebulizer solution Take 2.5 mg by nebulization daily as needed for wheezing or shortness of breath.     Historical Provider, MD     Physical Exam: Filed Vitals:   11/16/12 1000 11/16/12 1347  BP: 121/72 117/64  Pulse: 86 60  Temp: 97 F (36.1 C)   TempSrc: Oral   Resp: 20 18  SpO2: 99% 100%     Constitutional: Vital signs reviewed. Patient is a well-developed and well-nourished in no acute distress and cooperative  with exam. Alert and oriented x3.  Head: Normocephalic and atraumatic  Ear: TM normal bilaterally  Mouth: no erythema or exudates, MMM  Eyes: PERRL, EOMI, conjunctivae normal, No scleral icterus.  Neck: Supple, Trachea midline normal ROM, No JVD, mass, thyromegaly, or carotid bruit present.  Cardiovascular: RRR, S1 normal, S2 normal, no MRG, pulses symmetric and intact bilaterally  Pulmonary/Chest: CTAB, no wheezes, rales, or rhonchi  Abdominal: Soft. Non-tender, non-distended, bowel sounds are normal, no masses, organomegaly, or guarding present.  GU: no CVA tenderness Musculoskeletal: No joint deformities, erythema, or stiffness, ROM full and no nontender Ext: no edema and no cyanosis, pulses palpable bilaterally (DP and PT)  Hematology: no cervical, inginal, or axillary adenopathy.  Neurological: A&O x3, Strenght is normal and symmetric bilaterally, cranial nerve II-XII are grossly intact, no focal motor deficit, sensory intact to light touch bilaterally.  Skin: Warm, dry and intact. No rash, cyanosis, or clubbing.  Psychiatric: Normal mood and affect. speech and behavior is normal. Judgment and thought  content normal. Cognition and memory are normal.       Labs on Admission:    Basic Metabolic Panel:  Recent Labs Lab 11/16/12 1045  NA 140  K 3.3*  CL 107  CO2 25  GLUCOSE 185*  BUN 20  CREATININE 1.10  CALCIUM 8.7   Liver Function Tests: No results found for this basename: AST, ALT, ALKPHOS, BILITOT, PROT, ALBUMIN,  in the last 168 hours No results found for this basename: LIPASE, AMYLASE,  in the last 168 hours No results found for this basename: AMMONIA,  in the last 168 hours CBC:  Recent Labs Lab 11/16/12 1045  WBC 7.5  NEUTROABS 5.0  HGB 12.4*  HCT 35.5*  MCV 94.2  PLT 219   Cardiac Enzymes: No results found for this basename: CKTOTAL, CKMB, CKMBINDEX, TROPONINI,  in the last 168 hours  BNP (last 3 results) No results found for this basename: PROBNP,  in the last 8760 hours    CBG: No results found for this basename: GLUCAP,  in the last 168 hours  Radiological Exams on Admission: No results found.  EKG: Independently reviewed.   Assessment/Plan Active Problems:   * No active hospital problems. *   Left lower extremity cellulitis Patient has been started on broad-spectrum antibiotics, vancomycin Primaxin Blood cultures been obtained Dr. Lajoyce Corners to has been notified by EDP We'll obtain a CT to rule out osteomyelitis, necrotizing fascia is  Diabetes mellitus on oral hypoglycemics at home Will be started on sliding scale insulin, hold metformin  Hypokalemia secondary to diuretics Replete  History of DVT Continue Coumadin per pharmacy protocol   Code Status:   full Family Communication: bedside Disposition Plan: admit   Time spent: 70 mins   [facility] Pager [phone]  If 7PM-7AM, please contact night-coverage [email or web address] Password [facility] 11/16/2012, 3:44 PM

## 2012-11-16 NOTE — ED Notes (Signed)
Pt from home c/o left leg pain from skin graft/wound. Pt has difficulty ambulating/adl/ Per wife leg is redder. Pt has wound vac.

## 2012-11-16 NOTE — ED Notes (Signed)
Patient transported to CT 

## 2012-11-17 DIAGNOSIS — E1151 Type 2 diabetes mellitus with diabetic peripheral angiopathy without gangrene: Secondary | ICD-10-CM | POA: Diagnosis present

## 2012-11-17 DIAGNOSIS — L0390 Cellulitis, unspecified: Secondary | ICD-10-CM | POA: Diagnosis present

## 2012-11-17 DIAGNOSIS — I82409 Acute embolism and thrombosis of unspecified deep veins of unspecified lower extremity: Secondary | ICD-10-CM | POA: Diagnosis present

## 2012-11-17 DIAGNOSIS — I739 Peripheral vascular disease, unspecified: Secondary | ICD-10-CM | POA: Diagnosis present

## 2012-11-17 DIAGNOSIS — L409 Psoriasis, unspecified: Secondary | ICD-10-CM | POA: Diagnosis present

## 2012-11-17 DIAGNOSIS — G894 Chronic pain syndrome: Secondary | ICD-10-CM | POA: Diagnosis present

## 2012-11-17 DIAGNOSIS — E119 Type 2 diabetes mellitus without complications: Secondary | ICD-10-CM

## 2012-11-17 DIAGNOSIS — L03119 Cellulitis of unspecified part of limb: Principal | ICD-10-CM

## 2012-11-17 DIAGNOSIS — D649 Anemia, unspecified: Secondary | ICD-10-CM | POA: Diagnosis present

## 2012-11-17 DIAGNOSIS — L408 Other psoriasis: Secondary | ICD-10-CM

## 2012-11-17 DIAGNOSIS — J449 Chronic obstructive pulmonary disease, unspecified: Secondary | ICD-10-CM | POA: Diagnosis present

## 2012-11-17 DIAGNOSIS — L02419 Cutaneous abscess of limb, unspecified: Principal | ICD-10-CM

## 2012-11-17 LAB — GLUCOSE, CAPILLARY
Glucose-Capillary: 96 mg/dL (ref 70–99)
Glucose-Capillary: 76 mg/dL (ref 70–99)

## 2012-11-17 LAB — COMPREHENSIVE METABOLIC PANEL
ALT: 9 U/L (ref 0–53)
AST: 12 U/L (ref 0–37)
Albumin: 2.7 g/dL — ABNORMAL LOW (ref 3.5–5.2)
Alkaline Phosphatase: 95 U/L (ref 39–117)
Glucose, Bld: 89 mg/dL (ref 70–99)
Potassium: 3.9 mEq/L (ref 3.5–5.1)
Sodium: 139 mEq/L (ref 135–145)
Total Protein: 6.4 g/dL (ref 6.0–8.3)

## 2012-11-17 LAB — CBC
Platelets: 193 10*3/uL (ref 150–400)
RBC: 3.41 MIL/uL — ABNORMAL LOW (ref 4.22–5.81)
RDW: 13.8 % (ref 11.5–15.5)
WBC: 7.1 10*3/uL (ref 4.0–10.5)

## 2012-11-17 LAB — HEMOGLOBIN A1C
Hgb A1c MFr Bld: 5.5 % (ref <5.7)
Mean Plasma Glucose: 111 mg/dL (ref <117)

## 2012-11-17 MED ORDER — SIMVASTATIN 5 MG PO TABS
5.0000 mg | ORAL_TABLET | Freq: Every day | ORAL | Status: DC
  Administered 2012-11-17 – 2012-11-19 (×3): 5 mg via ORAL
  Filled 2012-11-17 (×4): qty 1

## 2012-11-17 MED ORDER — TOPIRAMATE 100 MG PO TABS
100.0000 mg | ORAL_TABLET | Freq: Two times a day (BID) | ORAL | Status: DC
  Administered 2012-11-17 – 2012-11-20 (×6): 100 mg via ORAL
  Filled 2012-11-17 (×7): qty 1

## 2012-11-17 MED ORDER — FENTANYL 25 MCG/HR TD PT72
25.0000 ug | MEDICATED_PATCH | TRANSDERMAL | Status: DC
  Administered 2012-11-19: 25 ug via TRANSDERMAL
  Filled 2012-11-17: qty 1

## 2012-11-17 MED ORDER — HYDROMORPHONE HCL PF 1 MG/ML IJ SOLN
0.5000 mg | Freq: Once | INTRAMUSCULAR | Status: AC
  Administered 2012-11-17: 0.5 mg via INTRAVENOUS

## 2012-11-17 MED ORDER — ALBUTEROL SULFATE (5 MG/ML) 0.5% IN NEBU: 2.5000 mg | INHALATION_SOLUTION | RESPIRATORY_TRACT | Status: DC | PRN

## 2012-11-17 MED ORDER — WARFARIN SODIUM 10 MG PO TABS
10.0000 mg | ORAL_TABLET | Freq: Once | ORAL | Status: AC
  Administered 2012-11-17: 10 mg via ORAL
  Filled 2012-11-17: qty 1

## 2012-11-17 MED ORDER — POTASSIUM CHLORIDE CRYS ER 20 MEQ PO TBCR
20.0000 meq | EXTENDED_RELEASE_TABLET | Freq: Every day | ORAL | Status: DC
  Administered 2012-11-17 – 2012-11-20 (×4): 20 meq via ORAL
  Filled 2012-11-17 (×5): qty 1

## 2012-11-17 MED ORDER — FERROUS SULFATE 325 (65 FE) MG PO TABS
325.0000 mg | ORAL_TABLET | Freq: Three times a day (TID) | ORAL | Status: DC
  Administered 2012-11-18 – 2012-11-20 (×8): 325 mg via ORAL
  Filled 2012-11-17 (×10): qty 1

## 2012-11-17 MED ORDER — MOMETASONE FURO-FORMOTEROL FUM 100-5 MCG/ACT IN AERO
2.0000 | INHALATION_SPRAY | Freq: Two times a day (BID) | RESPIRATORY_TRACT | Status: DC
  Administered 2012-11-17 – 2012-11-19 (×5): 2 via RESPIRATORY_TRACT
  Filled 2012-11-17: qty 8.8

## 2012-11-17 MED ORDER — MONTELUKAST SODIUM 10 MG PO TABS
10.0000 mg | ORAL_TABLET | Freq: Every day | ORAL | Status: DC
  Administered 2012-11-17 – 2012-11-19 (×3): 10 mg via ORAL
  Filled 2012-11-17 (×4): qty 1

## 2012-11-17 MED ORDER — DULOXETINE HCL 30 MG PO CPEP
30.0000 mg | ORAL_CAPSULE | Freq: Every day | ORAL | Status: DC
  Administered 2012-11-17 – 2012-11-20 (×4): 30 mg via ORAL
  Filled 2012-11-17 (×4): qty 1

## 2012-11-17 MED ORDER — FUROSEMIDE 40 MG PO TABS
40.0000 mg | ORAL_TABLET | Freq: Every day | ORAL | Status: DC
  Administered 2012-11-17 – 2012-11-19 (×3): 40 mg via ORAL
  Filled 2012-11-17 (×4): qty 1

## 2012-11-17 MED ORDER — WARFARIN - PHARMACIST DOSING INPATIENT: Freq: Every day | Status: DC

## 2012-11-17 MED ORDER — MUPIROCIN CALCIUM 2 % EX CREA
TOPICAL_CREAM | Freq: Two times a day (BID) | CUTANEOUS | Status: DC
  Filled 2012-11-17 (×2): qty 15

## 2012-11-17 NOTE — Progress Notes (Signed)
Patient refusing daily dressing change. He wants to wait until he can receive IV pain medication at 7pm to change the dressing. Will relay onto night shift.

## 2012-11-17 NOTE — Progress Notes (Signed)
Patient now agreeable to dressing change. Will apply bactroban and Kerlex to wound.

## 2012-11-17 NOTE — Progress Notes (Signed)
ANTICOAGULATION CONSULT NOTE - Initial Consult  Pharmacy Consult for warfarin Indication: recurrent DVT  No Known Allergies  Patient Measurements:    Vital Signs: Temp: 98.3 F (36.8 C) (07/29 0438) Temp src: Oral (07/29 0438) BP: 114/57 mmHg (07/29 0438) Pulse Rate: 66 (07/29 0438)  Labs:  Recent Labs  11/16/12 1045 11/16/12 1605 11/16/12 1607 11/16/12 1850 11/16/12 2206 11/17/12 0400 11/17/12 0555  HGB 12.4*  --   --   --   --   --  10.7*  HCT 35.5*  --   --   --   --   --  32.3*  PLT 219  --   --   --   --   --  193  LABPROT  --  17.3*  --  18.1*  --  16.9*  --   INR  --  1.45  --  1.54*  --  1.41  --   CREATININE 1.10  --   --   --   --  0.87  --   TROPONINI  --   --  <0.30  --  <0.30 <0.30  --     The CrCl is unknown because both a height and weight (above a minimum accepted value) are required for this calculation.   Medical History: Past Medical History  Diagnosis Date  . Diabetes mellitus   . GERD (gastroesophageal reflux disease)   . Asthma   . Shortness of breath   . Leg pain   . Peripheral vascular disease   . Sleep apnea     uses bipap, sleep study done in [place] 2 years ago, dr Cala Bradford byrd  . Anemia   . Headache(784.0)     sinus headaches  . COPD (chronic obstructive pulmonary disease)     3L continuous  . Peripheral neuropathy   . Cancer     hx basal cell skin cancer  . Psoriasis   . 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 CHF (congestive heart failure)     does not see a cardiologist    Medications:  Prescriptions prior to admission  Medication Sig Dispense Refill  . celecoxib (CELEBREX) 200 MG capsule Take 200 mg by mouth daily.      [name] doxycycline (VIBRA-TABS) 100 MG tablet Take 100 mg by mouth 2 (two) times daily. 30 day course started 10/26/12      . DULoxetine (CYMBALTA) 30 MG capsule Take 30 mg by mouth daily.      . fentaNYL (DURAGESIC - DOSED MCG/HR) 25 MCG/HR Place 1 patch onto the skin every 3 (three) days.       . ferrous sulfate 325 (65 FE) MG tablet Take 325 mg by mouth 3 (three) times daily with meals.       . Fluticasone-Salmeterol (ADVAIR) 250-50 MCG/DOSE AEPB Inhale 1 puff into the lungs daily.      . furosemide (LASIX) 40 MG tablet Take 40 mg by mouth daily.       [name] levalbuterol (XOPENEX HFA) 45 MCG/ACT inhaler Inhale 2 puffs into the lungs daily as needed for wheezing or shortness of breath.       . montelukast (SINGULAIR) 10 MG tablet Take 10 mg by mouth at bedtime.       [name] oxyCODONE (OXY IR/ROXICODONE) 5 MG immediate release tablet Take 5 mg by mouth 5 (five) times daily as needed for pain.       . potassium chloride SA (K-DUR,KLOR-CON) 20 MEQ tablet Take  20 mEq by mouth daily.      . pravastatin (PRAVACHOL) 10 MG tablet Take 10 mg by mouth at bedtime.       . topiramate (TOPAMAX) 100 MG tablet Take 100 mg by mouth 2 (two) times daily. Also takes 50 mg daily at noon      . Vitamin D, Ergocalciferol, (DRISDOL) 50000 UNITS CAPS Take 50,000 Units by mouth every 7 (seven) days. Sunday      . warfarin (COUMADIN) 5 MG tablet Take 7.5 mg by mouth daily.      [name] albuterol (PROVENTIL) (2.5 MG/3ML) 0.083% nebulizer solution Take 2.5 mg by nebulization daily as needed for wheezing or shortness of breath.         Assessment: 62 yom on chronic coumadin for hx DVT. INR is subtherapeutic 1.41. H/H 10.7/32.3, plts 193, no bleeding noted. Missed dose last night but was also subtherapeutic upon presentation. MD to evaluate need for possible lovenox/heparin bridge.   Goal of Therapy:  INR 2-3   Plan:  1. Warfarin 10mg  PO x 1 tonight 2. Daily INR 3. F/u need for lovenox or heparin bridge   Kairee Isa, Drake Leach 11/17/2012,11:12 AM

## 2012-11-17 NOTE — Consult Note (Signed)
Reason for Consult: Cellulitis left leg Referring Physician: [name] is an 62 y.o. male.  HPI: Patient is a 62 year old gentleman with chronic ulceration left lower extremity. He has undergone multiple surgical interventions for limb salvage surgery. Patient was most recently discharged with a wound VAC with allograft split thickness skin graft. Patient presents at this time with acute cellulitis involving the left lower extremity.  Past Medical History  Diagnosis Date  . Diabetes mellitus   . GERD (gastroesophageal reflux disease)   . Asthma   . Shortness of breath   . Leg pain   . Peripheral vascular disease   . Sleep apnea     uses bipap, sleep study done in [place] 2 years ago, dr Cala Bradford byrd  . Anemia   . Headache(784.0)     sinus headaches  . COPD (chronic obstructive pulmonary disease)     3L continuous  . Peripheral neuropathy   . Cancer     hx basal cell skin cancer  . Psoriasis   . 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 CHF (congestive heart failure)     does not see a cardiologist    Past Surgical History  Procedure Laterality Date  . Arm surgery    . Vein ligation and stripping    . Tummy tuck    . Stomach stapling    . Skin grafts    . I&d extremity  03/13/2011    Procedure: IRRIGATION AND DEBRIDEMENT EXTREMITY;  Surgeon: Nadara Mustard, MD;  Location: [facility] OR;  Service: Orthopedics;  Laterality: Left;  . Skin split graft  03/13/2012    Procedure: SKIN GRAFT SPLIT THICKNESS;  Surgeon: Nadara Mustard, MD;  Location: [facility] OR;  Service: Orthopedics;  Laterality: Left;  Excisional Debridement Left Leg, Split Thickness Skin Graft, Wound VAC  . Back stimulator    . Apligraft placement Left 07/31/2012    Procedure: Apply Skin Graft;  Surgeon: Nadara Mustard, MD;  Location: [facility] OR;  Service: Orthopedics;  Laterality: Left;  . Application of wound vac Left 07/31/2012    Procedure: APPLICATION OF WOUND VAC;  Surgeon: Nadara Mustard, MD;  Location:  [facility] OR;  Service: Orthopedics;  Laterality: Left;  . Cardiac catheterization      Hx: of " around 1985 at [facility]"  . Cyst excision      Hx: of  . I&d extremity Left 11/06/2012    Procedure: IRRIGATION AND DEBRIDEMENT EXTREMITY-Left lower ;  Surgeon: Nadara Mustard, MD;  Location: [facility] OR;  Service: Orthopedics;  Laterality: Left;  Left Leg Irrigation and Debridement, Place Skin Graft, Wound VAC, [place]    Family History  Problem Relation Age of Onset  . Heart disease Brother     Social History:  reports that he quit smoking about 21 years ago. His smoking use included Cigarettes. He has a 3 pack-year smoking history. He has never used smokeless tobacco. He reports that  drinks alcohol. He reports that he does not use illicit drugs.  Allergies: No Known Allergies  Medications: I have reviewed the patient's current medications.  Results for orders placed during the hospital encounter of 11/16/12 (from the past 48 hour(s))  CBC WITH DIFFERENTIAL     Status: Abnormal   Collection Time    11/16/12 10:45 AM      Result Value Range   WBC 7.5  4.0 - 10.5 K/uL   RBC 3.77 (*) 4.22 - 5.81 MIL/uL  Hemoglobin 12.4 (*) 13.0 - 17.0 g/dL   HCT 29.5 (*) 62.1 - 30.8 %   MCV 94.2  78.0 - 100.0 fL   MCH 32.9  26.0 - 34.0 pg   MCHC 34.9  30.0 - 36.0 g/dL   RDW 65.7  84.6 - 96.2 %   Platelets 219  150 - 400 K/uL   Neutrophils Relative % 67  43 - 77 %   Neutro Abs 5.0  1.7 - 7.7 K/uL   Lymphocytes Relative 17  12 - 46 %   Lymphs Abs 1.3  0.7 - 4.0 K/uL   Monocytes Relative 13 (*) 3 - 12 %   Monocytes Absolute 1.0  0.1 - 1.0 K/uL   Eosinophils Relative 3  0 - 5 %   Eosinophils Absolute 0.2  0.0 - 0.7 K/uL   Basophils Relative 1  0 - 1 %   Basophils Absolute 0.0  0.0 - 0.1 K/uL  BASIC METABOLIC PANEL     Status: Abnormal   Collection Time    11/16/12 10:45 AM      Result Value Range   Sodium 140  135 - 145 mEq/L   Potassium 3.3 (*) 3.5 - 5.1 mEq/L   Chloride 107  96 - 112 mEq/L   CO2 25  19 -  32 mEq/L   Glucose, Bld 185 (*) 70 - 99 mg/dL   BUN 20  6 - 23 mg/dL   Creatinine, Ser 9.52  0.50 - 1.35 mg/dL   Calcium 8.7  8.4 - 84.1 mg/dL   GFR calc non Af Amer 71 (*) >90 mL/min   GFR calc Af Amer 82 (*) >90 mL/min   Comment:            The eGFR has been calculated     using the CKD EPI equation.     This calculation has not been     validated in all clinical     situations.     eGFR's persistently     <90 mL/min signify     possible Chronic Kidney Disease.  PROTIME-INR     Status: Abnormal   Collection Time    11/16/12  4:05 PM      Result Value Range   Prothrombin Time 17.3 (*) 11.6 - 15.2 seconds   INR 1.45  0.00 - 1.49  TROPONIN I     Status: None   Collection Time    11/16/12  4:07 PM      Result Value Range   Troponin I <0.30  <0.30 ng/mL   Comment:            Due to the release kinetics of cTnI,     a negative result within the first hours     of the onset of symptoms does not rule out     myocardial infarction with certainty.     If myocardial infarction is still suspected,     repeat the test at appropriate intervals.  GLUCOSE, CAPILLARY     Status: None   Collection Time    11/16/12  5:33 PM      Result Value Range   Glucose-Capillary 99  70 - 99 mg/dL   Comment 1 Notify RN     Comment 2 Documented in Chart    PROTIME-INR     Status: Abnormal   Collection Time    11/16/12  6:50 PM      Result Value Range   Prothrombin Time 18.1 (*) 11.6 -  15.2 seconds   INR 1.54 (*) 0.00 - 1.49  GLUCOSE, CAPILLARY     Status: Abnormal   Collection Time    11/16/12 10:03 PM      Result Value Range   Glucose-Capillary 246 (*) 70 - 99 mg/dL  TROPONIN I     Status: None   Collection Time    11/16/12 10:06 PM      Result Value Range   Troponin I <0.30  <0.30 ng/mL   Comment:            Due to the release kinetics of cTnI,     a negative result within the first hours     of the onset of symptoms does not rule out     myocardial infarction with certainty.     If  myocardial infarction is still suspected,     repeat the test at appropriate intervals.  TROPONIN I     Status: None   Collection Time    11/17/12  4:00 AM      Result Value Range   Troponin I <0.30  <0.30 ng/mL   Comment:            Due to the release kinetics of cTnI,     a negative result within the first hours     of the onset of symptoms does not rule out     myocardial infarction with certainty.     If myocardial infarction is still suspected,     repeat the test at appropriate intervals.  COMPREHENSIVE METABOLIC PANEL     Status: Abnormal   Collection Time    11/17/12  4:00 AM      Result Value Range   Sodium 139  135 - 145 mEq/L   Potassium 3.9  3.5 - 5.1 mEq/L   Chloride 108  96 - 112 mEq/L   CO2 22  19 - 32 mEq/L   Glucose, Bld 89  70 - 99 mg/dL   BUN 17  6 - 23 mg/dL   Creatinine, Ser 4.09  0.50 - 1.35 mg/dL   Calcium 8.5  8.4 - 81.1 mg/dL   Total Protein 6.4  6.0 - 8.3 g/dL   Albumin 2.7 (*) 3.5 - 5.2 g/dL   AST 12  0 - 37 U/L   ALT 9  0 - 53 U/L   Alkaline Phosphatase 95  39 - 117 U/L   Total Bilirubin 0.3  0.3 - 1.2 mg/dL   GFR calc non Af Amer >90  >90 mL/min   GFR calc Af Amer >90  >90 mL/min   Comment:            The eGFR has been calculated     using the CKD EPI equation.     This calculation has not been     validated in all clinical     situations.     eGFR's persistently     <90 mL/min signify     possible Chronic Kidney Disease.  PROTIME-INR     Status: Abnormal   Collection Time    11/17/12  4:00 AM      Result Value Range   Prothrombin Time 16.9 (*) 11.6 - 15.2 seconds   INR 1.41  0.00 - 1.49    Dg Chest 2 View  11/16/2012   *RADIOLOGY REPORT*  Clinical Data: Shortness of breath  CHEST - 2 VIEW  Comparison: July 29, 2012  Findings: Lungs clear.  Heart is mildly enlarged with  normal pulmonary vascularity.  No adenopathy.  There is a thoracic stimulator with the tip at the level of T9.  IMPRESSION: No edema or consolidation.  Stable cardiac  prominence.   Original Report Authenticated By: Bretta Bang, M.D.   Ct Tibia Fibula Left W Contrast  11/16/2012   *RADIOLOGY REPORT*  Clinical Data: Recent ulcer debridement and placement of split thickness skin graft on 11/06/2012.  Increasing pain and redness.  CT OF THE LEFT TIBIA AND FIBULA WITH CONTRAST  Contrast: OMNIPAQUE IOHEXOL 300 MG/ML  SOLN  Comparison: None.  Findings: There are extensive and calcifications within the skin and subcutaneous soft tissues throughout the entire left calf, this is most pronounced in the mid calf region.  Large area of heterotopic bone extends in the anterior tibia in the mid calf and extends laterally with an area of focal bridging to the fibula.  No focal fluid collections within the left calf.  No changes of acute osteomyelitis or acute fracture.  IMPRESSION: Extensive skin and subcutaneous calcifications.  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  No visible abscess or focal fluid collection.  No changes of acute osteomyelitis.   Original Report Authenticated By: Charlett Nose, M.D.    Review of Systems  All other systems reviewed and are negative.   Blood pressure 114/57, pulse 66, temperature 98.3 F (36.8 C), temperature source Oral, resp. rate 18, SpO2 100.00%. Physical Exam On examination the dressing was removed the Mepitel gauze was removed. Patient had greenish exudate consistent with possible Pseudomonas-like gram-negative infection. Patient's current antibiotics prior to admission where for MRSA gram-positive coverage. It appears that patient has developed an opportunistic Pseudomonas infection secondary to use of the wound VAC. Assessment/Plan: Cellulitis left leg status post skin graft and application of a wound VAC with opportunistic Pseudomonas infection.  Plan: I agree with coverage for both gram-positive and gram-negative bacteria. Patient will definitely need to be discharged  on antibiotics for gram-negative coverage. Will start dressing changes twice a day during his hospital stay and anticipated discharge once patient shows improvement of the cellulitis. Patient has had no fever chills no elevated white cell count. He may be weightbearing as tolerated.  Jeremy [name] V 11/17/2012, 6:46 AM

## 2012-11-17 NOTE — Progress Notes (Signed)
UR COMPLETED  

## 2012-11-17 NOTE — Progress Notes (Signed)
[facility] PROGRESS NOTE  Jeremy [name] ZOX:[id number] DOB: [birth year] DOA: 11/16/2012 PCP: Provider Not In System  Assessment/Plan:  Principal Problem:   Cellulitis: Continue current. Appreciate Dr. Lajoyce Corners Active Problems:   DVT (deep venous thrombosis): Coumadin per pharmacy   PVD (peripheral vascular disease)   COPD (chronic obstructive pulmonary disease)   Psoriasis   Chronic pain syndrome   Chronic anemia  Code Status: full Family Communication: wife at bedside Disposition Plan: home  Consultants:  Lajoyce Corners  Procedures:    Antibiotics:  Vancomycin, 7/29  primaxin  7/29  HPI/Subjective: C/o pain. Frustrated with ongoing leg problems. No other complaints.  Objective: Filed Vitals:   11/16/12 1730 11/16/12 2145 11/17/12 0438 11/17/12 1348  BP: 138/65 114/92 114/57 134/53  Pulse: 63 77 66 67  Temp: 97.9 F (36.6 C) 98.8 F (37.1 C) 98.3 F (36.8 C) 97.6 F (36.4 C)  TempSrc:  Oral Oral   Resp: 18 18 18 18   SpO2: 100% 100% 100% 100%    Intake/Output Summary (Last 24 hours) at 11/17/12 1730 Last data filed at 11/17/12 1300  Gross per 24 hour  Intake   1400 ml  Output    400 ml  Net   1000 ml   There were no vitals filed for this visit.  Exam:   General:  Talkative. See that the side of the bed. Nontoxic.  Cardiovascular: Regular rate rhythm without murmurs gallops rub  Respiratory: Clear to auscultation bilaterally without wheeze rhonchi or rales  Abdomen: Soft nontender nondistended  Musculoskeletal: Diminished pulses. Dressing on left leg. Patient does not allow me to remove it due to pain.  Data Reviewed: Basic Metabolic Panel:  Recent Labs Lab 11/16/12 1045 11/17/12 0400  NA 140 139  K 3.3* 3.9  CL 107 108  CO2 25 22  GLUCOSE 185* 89  BUN 20 17  CREATININE 1.10 0.87  CALCIUM 8.7 8.5   Liver Function Tests:  Recent Labs Lab 11/17/12 0400  AST 12  ALT 9  ALKPHOS 95  BILITOT 0.3  PROT 6.4  ALBUMIN 2.7*   No results  found for this basename: LIPASE, AMYLASE,  in the last 168 hours No results found for this basename: AMMONIA,  in the last 168 hours CBC:  Recent Labs Lab 11/16/12 1045 11/17/12 0555  WBC 7.5 7.1  NEUTROABS 5.0  --   HGB 12.4* 10.7*  HCT 35.5* 32.3*  MCV 94.2 94.7  PLT 219 193   Cardiac Enzymes:  Recent Labs Lab 11/16/12 1607 11/16/12 2206 11/17/12 0400  TROPONINI <0.30 <0.30 <0.30   BNP (last 3 results) No results found for this basename: PROBNP,  in the last 8760 hours CBG:  Recent Labs Lab 11/16/12 1733 11/16/12 2203 11/17/12 0646 11/17/12 1140 11/17/12 1712  GLUCAP 99 246* 96 136* 76    Recent Results (from the past 240 hour(s))  CULTURE, BLOOD (ROUTINE X 2)     Status: None   Collection Time    11/16/12  3:41 PM      Result Value Range Status   Specimen Description BLOOD ARM LEFT   Final   Special Requests     Final   Value: BOTTLES DRAWN AEROBIC AND ANAEROBIC 10CC PATIENT ON FOLLOWING VANC   Culture  Setup Time 11/16/2012 22:27   Final   Culture     Final   Value:        BLOOD CULTURE RECEIVED NO GROWTH TO DATE CULTURE WILL BE HELD FOR 5 DAYS BEFORE ISSUING A  FINAL NEGATIVE REPORT   Report Status PENDING   Incomplete  CULTURE, BLOOD (ROUTINE X 2)     Status: None   Collection Time    11/16/12  4:02 PM      Result Value Range Status   Specimen Description BLOOD HAND LEFT   Final   Special Requests     Final   Value: BOTTLES DRAWN AEROBIC ONLY 5CC PATIENT ON FOLLOWING VANC   Culture  Setup Time 11/16/2012 22:26   Final   Culture     Final   Value:        BLOOD CULTURE RECEIVED NO GROWTH TO DATE CULTURE WILL BE HELD FOR 5 DAYS BEFORE ISSUING A FINAL NEGATIVE REPORT   Report Status PENDING   Incomplete     Studies: Dg Chest 2 View  11/16/2012   *RADIOLOGY REPORT*  Clinical Data: Shortness of breath  CHEST - 2 VIEW  Comparison: July 29, 2012  Findings: Lungs clear.  Heart is mildly enlarged with normal pulmonary vascularity.  No adenopathy.  There is  a thoracic stimulator with the tip at the level of T9.  IMPRESSION: No edema or consolidation.  Stable cardiac prominence.   Original Report Authenticated By: Bretta Bang, M.D.   Ct Tibia Fibula Left W Contrast  11/16/2012   *RADIOLOGY REPORT*  Clinical Data: Recent ulcer debridement and placement of split thickness skin graft on 11/06/2012.  Increasing pain and redness.  CT OF THE LEFT TIBIA AND FIBULA WITH CONTRAST  Contrast: OMNIPAQUE IOHEXOL 300 MG/ML  SOLN  Comparison: None.  Findings: There are extensive and calcifications within the skin and subcutaneous soft tissues throughout the entire left calf, this is most pronounced in the mid calf region.  Large area of heterotopic bone extends in the anterior tibia in the mid calf and extends laterally with an area of focal bridging to the fibula.  No focal fluid collections within the left calf.  No changes of acute osteomyelitis or acute fracture.  IMPRESSION: Extensive skin and subcutaneous calcifications.  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  No visible abscess or focal fluid collection.  No changes of acute osteomyelitis.   Original Report Authenticated By: Charlett Nose, M.D.    Scheduled Meds: . DULoxetine  30 mg Oral Daily  . [START ON 11/19/2012] fentaNYL  25 mcg Transdermal Q72H  . [START ON 11/18/2012] ferrous sulfate  325 mg Oral TID WC  . furosemide  40 mg Oral Daily  . imipenem-cilastatin  500 mg Intravenous Q6H  . insulin aspart  0-9 Units Subcutaneous TID WC  . mometasone-formoterol  2 puff Inhalation BID  . montelukast  10 mg Oral QHS  . mupirocin cream   Topical BID  . potassium chloride SA  20 mEq Oral Daily  . simvastatin  5 mg Oral q1800  . sodium chloride  3 mL Intravenous Q12H  . topiramate  100 mg Oral BID  . Warfarin - Pharmacist Dosing Inpatient   Does not apply q1800   Continuous Infusions: . sodium chloride     Time spent: 35  minutes  Emery Binz L  [facility] Pager [phone]. If 7PM-7AM, please contact night-coverage at [email or web address], password [facility] 11/17/2012, 5:30 PM  LOS: 1 day

## 2012-11-18 LAB — PROTIME-INR
INR: 1.51 — ABNORMAL HIGH (ref 0.00–1.49)
Prothrombin Time: 17.8 seconds — ABNORMAL HIGH (ref 11.6–15.2)

## 2012-11-18 LAB — GLUCOSE, CAPILLARY
Glucose-Capillary: 97 mg/dL (ref 70–99)
Glucose-Capillary: 90 mg/dL (ref 70–99)

## 2012-11-18 MED ORDER — WARFARIN SODIUM 10 MG PO TABS
10.0000 mg | ORAL_TABLET | Freq: Once | ORAL | Status: AC
  Administered 2012-11-18: 10 mg via ORAL
  Filled 2012-11-18: qty 1

## 2012-11-18 MED ORDER — MUPIROCIN CALCIUM 2 % EX CREA
TOPICAL_CREAM | Freq: Three times a day (TID) | CUTANEOUS | Status: DC
  Administered 2012-11-18: 1 via TOPICAL
  Administered 2012-11-18 – 2012-11-20 (×6): via TOPICAL
  Filled 2012-11-18 (×6): qty 15

## 2012-11-18 MED ORDER — OXYCODONE HCL ER 10 MG PO T12A
10.0000 mg | EXTENDED_RELEASE_TABLET | Freq: Two times a day (BID) | ORAL | Status: DC
  Administered 2012-11-18 – 2012-11-19 (×3): 10 mg via ORAL
  Filled 2012-11-18 (×3): qty 1

## 2012-11-18 MED ORDER — HYDROMORPHONE HCL PF 1 MG/ML IJ SOLN
0.5000 mg | INTRAMUSCULAR | Status: DC | PRN
  Administered 2012-11-18 – 2012-11-20 (×10): 0.5 mg via INTRAVENOUS
  Filled 2012-11-18 (×11): qty 1

## 2012-11-18 NOTE — ED Provider Notes (Signed)
CSN: [id number]     Arrival date & time 11/16/12  9562 History     First MD Initiated Contact with Patient 11/16/12 0957     Chief Complaint  Patient presents with  . Leg Pain   (Consider location/radiation/quality/duration/timing/severity/associated sxs/prior Treatment) HPI Patient, since emergency department with cellulitis and redness of his left lower leg.  Patient has a wound VAC and skin grafting in place.  Patient was seen by Dr. Lajoyce Corners for these issues.  Patient, states he woke this morning with significant redness around the wound VAC site.  Patient denies fevers, nausea, vomiting, diarrhea, blurred vision, headache, weakness, numbness, dizziness, or syncope.  Patient, states he normally does not get, fevers with infection.  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  Patient, states the pain in the area has increased over the last 3 days. Past Medical History  Diagnosis Date  . Diabetes mellitus   . GERD (gastroesophageal reflux disease)   . Asthma   . Shortness of breath   . Leg pain   . Peripheral vascular disease   . Sleep apnea     uses bipap, sleep study done in [place] 2 years ago, dr Cala Bradford byrd  . Anemia   . Headache(784.0)     sinus headaches  . COPD (chronic obstructive pulmonary disease)     3L continuous  . Peripheral neuropathy   . Cancer     hx basal cell skin cancer  . Psoriasis   . 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 CHF (congestive heart failure)     does not see a cardiologist   Past Surgical History  Procedure Laterality Date  . Arm surgery    . Vein ligation and stripping    . Tummy tuck    . Stomach stapling    . Skin grafts    . I&d extremity  03/13/2011    Procedure: IRRIGATION AND DEBRIDEMENT EXTREMITY;  Surgeon: Nadara Mustard, MD;  Location: [facility] OR;  Service: Orthopedics;  Laterality: Left;  . Skin split graft  03/13/2012    Procedure: SKIN GRAFT SPLIT THICKNESS;  Surgeon: Nadara Mustard, MD;   Location: [facility] OR;  Service: Orthopedics;  Laterality: Left;  Excisional Debridement Left Leg, Split Thickness Skin Graft, Wound VAC  . Back stimulator    . Apligraft placement Left 07/31/2012    Procedure: Apply Skin Graft;  Surgeon: Nadara Mustard, MD;  Location: [facility] OR;  Service: Orthopedics;  Laterality: Left;  . Application of wound vac Left 07/31/2012    Procedure: APPLICATION OF WOUND VAC;  Surgeon: Nadara Mustard, MD;  Location: [facility] OR;  Service: Orthopedics;  Laterality: Left;  . Cardiac catheterization      Hx: of " around 1985 at [facility]"  . Cyst excision      Hx: of  . I&d extremity Left 11/06/2012    Procedure: IRRIGATION AND DEBRIDEMENT EXTREMITY-Left lower ;  Surgeon: Nadara Mustard, MD;  Location: [facility] OR;  Service: Orthopedics;  Laterality: Left;  Left Leg Irrigation and Debridement, Place Skin Graft, Wound VAC, [place]   Family History  Problem Relation Age of Onset  . Heart disease Brother    History  Substance Use Topics  . Smoking status: Former Smoker -- 0.50 packs/day for 6 years    Types: Cigarettes    Quit date: 12/03/1990  . Smokeless tobacco: Never Used  . Alcohol Use: 0.0 oz/week    0 Cans of  beer per week     Comment: rare    Review of Systems All other systems negative except as documented in the HPI. All pertinent positives and negatives as reviewed in the HPI. Allergies  Review of patient's allergies indicates no known allergies.  Home Medications  No current outpatient prescriptions on file. BP 110/52  Pulse 54  Temp(Src) 97.8 F (36.6 C) (Oral)  Resp 18  SpO2 100% Physical Exam  Nursing note and vitals reviewed. Constitutional: He is oriented to person, place, and time. He appears well-developed and well-nourished.  HENT:  Head: Normocephalic and atraumatic.  Mouth/Throat: Oropharynx is clear and moist.  Eyes: Pupils are equal, round, and reactive to light.  Neck: Normal range of motion. Neck supple.  Cardiovascular: Normal rate and regular  rhythm.  Exam reveals no gallop and no friction rub.   No murmur heard. Pulmonary/Chest: Effort normal and breath sounds normal.  Neurological: He is alert and oriented to person, place, and time. He exhibits normal muscle tone. Coordination normal.  Skin: Skin is warm and dry.  Patient has redness around the area of the wound VAC, increased, palpable pain    ED Course   Procedures (including critical care time)  Labs Reviewed  CBC WITH DIFFERENTIAL - Abnormal; Notable for the following:    RBC 3.77 (*)    Hemoglobin 12.4 (*)    HCT 35.5 (*)    Monocytes Relative 13 (*)    All other components within normal limits  BASIC METABOLIC PANEL - Abnormal; Notable for the following:    Potassium 3.3 (*)    Glucose, Bld 185 (*)    GFR calc non Af Amer 71 (*)    GFR calc Af Amer 82 (*)    All other components within normal limits  PROTIME-INR - Abnormal; Notable for the following:    Prothrombin Time 17.3 (*)    All other components within normal limits  PROTIME-INR - Abnormal; Notable for the following:    Prothrombin Time 18.1 (*)    INR 1.54 (*)    All other components within normal limits  COMPREHENSIVE METABOLIC PANEL - Abnormal; Notable for the following:    Albumin 2.7 (*)    All other components within normal limits  PROTIME-INR - Abnormal; Notable for the following:    Prothrombin Time 16.9 (*)    All other components within normal limits  GLUCOSE, CAPILLARY - Abnormal; Notable for the following:    Glucose-Capillary 246 (*)    All other components within normal limits  CBC - Abnormal; Notable for the following:    RBC 3.41 (*)    Hemoglobin 10.7 (*)    HCT 32.3 (*)    All other components within normal limits  GLUCOSE, CAPILLARY - Abnormal; Notable for the following:    Glucose-Capillary 136 (*)    All other components within normal limits  PROTIME-INR - Abnormal; Notable for the following:    Prothrombin Time 17.8 (*)    INR 1.51 (*)    All other components within  normal limits  GLUCOSE, CAPILLARY - Abnormal; Notable for the following:    Glucose-Capillary 179 (*)    All other components within normal limits  GLUCOSE, CAPILLARY - Abnormal; Notable for the following:    Glucose-Capillary 176 (*)    All other components within normal limits  CULTURE, BLOOD (ROUTINE X 2)  CULTURE, BLOOD (ROUTINE X 2)  TROPONIN I  TROPONIN I  TROPONIN I  HEMOGLOBIN A1C  GLUCOSE, CAPILLARY  GLUCOSE,  CAPILLARY  GLUCOSE, CAPILLARY  GLUCOSE, CAPILLARY  GLUCOSE, CAPILLARY  PROTIME-INR   No results found. 1. Cellulitis of left lower leg   2. Chronic anemia   3. Chronic pain syndrome   4. DM type 2 (diabetes mellitus, type 2)   5. Psoriasis   6. PVD (peripheral vascular disease)   7. DVT (deep venous thrombosis), unspecified laterality    he'll be admitted to the hospital for further evaluation of this area of cellulitis.  I spoke with Dr. Lajoyce Corners and [facility] about the patient.  Patient be admitted to the hospital for IV antibiotics and further care. MDM  MDM Reviewed: vitals, nursing note and previous chart Reviewed previous: labs Interpretation: labs Consults: admitting MD and orthopedics      Carlyle Dolly, PA-C 11/18/12 2013

## 2012-11-18 NOTE — Progress Notes (Signed)
[facility] PROGRESS NOTE  Jeremy [name] ZHY:[id number] DOB: [birth year] DOA: 11/16/2012 PCP: Provider Not In System  Assessment/Plan: Principal Problem:   Cellulitis: Seen by or so. Plan is for amputation. Orthopedic surgery when I get about x1 week. Trying to arrange for coverage with home health, but benefits for antibiotics are used up Active Problems:   DVT (deep venous thrombosis): INR subtherapeutic, pharmacy managing   PVD (peripheral vascular disease)   COPD (chronic obstructive pulmonary disease): Stable.   Psoriasis   Chronic pain syndrome: Discussed with patient, and he'll take when necessary pain medication. Has started extended release OxyContin with plans for OxyIR for breakthrough   Chronic anemia   DM type 2 (diabetes mellitus, type 2): CBG surprisingly stable   Code Status: Full code  Family Communication: Plan discussed with patient's wife at the bedside.  Disposition Plan: Initially plan had been for patient to go home on ID antibiotics x1 week, but looks like benefits not covered. Discuss with infectious disease to see if we can find suitable alternative IM/by mouth medicines. If not, patient will stay here until surgery.   Consultants:  Duda-orthopedic surgery  Procedures:  None  Antibiotics:  IV Primaxin and vancomycin day 3 started 7/28  HPI/Subjective: Patient doing okay. Still complains of ongoing pain in leg.  Objective: Filed Vitals:   11/17/12 2239 11/18/12 0525 11/18/12 0924 11/18/12 1435  BP:  110/53  110/52  Pulse:  56  54  Temp:  97.5 F (36.4 C)  97.8 F (36.6 C)  TempSrc:      Resp:  16  18  SpO2: 98% 100% 98% 100%    Intake/Output Summary (Last 24 hours) at 11/18/12 1603 Last data filed at 11/18/12 1300  Gross per 24 hour  Intake   1680 ml  Output   1100 ml  Net    580 ml   Filed Vitals:   11/18/12 1435  BP: 110/52  Pulse: 54  Temp: 97.8 F (36.6 C)  Resp: 18    Exam:   General:  Alert and oriented x3, mild  distress secondary to pain and leg  Cardiovascular: Regular rate and rhythm, S1-S2  Respiratory: Decreased breath sounds throughout  Abdomen: Soft nontender, nondistended, obese, positive bowel sounds  Musculoskeletal: Left lower extremity with 2+ edema, chronic venous stasis, wrapped with wound with minimal drainage   Data Reviewed: Basic Metabolic Panel:  Recent Labs Lab 11/16/12 1045 11/17/12 0400  NA 140 139  K 3.3* 3.9  CL 107 108  CO2 25 22  GLUCOSE 185* 89  BUN 20 17  CREATININE 1.10 0.87  CALCIUM 8.7 8.5   Liver Function Tests:  Recent Labs Lab 11/17/12 0400  AST 12  ALT 9  ALKPHOS 95  BILITOT 0.3  PROT 6.4  ALBUMIN 2.7*   CBC:  Recent Labs Lab 11/16/12 1045 11/17/12 0555  WBC 7.5 7.1  NEUTROABS 5.0  --   HGB 12.4* 10.7*  HCT 35.5* 32.3*  MCV 94.2 94.7  PLT 219 193   Cardiac Enzymes:  Recent Labs Lab 11/16/12 1607 11/16/12 2206 11/17/12 0400  TROPONINI <0.30 <0.30 <0.30   BNP (last 3 results) No results found for this basename: PROBNP,  in the last 8760 hours CBG:  Recent Labs Lab 11/17/12 1140 11/17/12 1712 11/17/12 2125 11/18/12 0708 11/18/12 1051  GLUCAP 136* 76 179* 97 176*    Recent Results (from the past 240 hour(s))  CULTURE, BLOOD (ROUTINE X 2)     Status: None   Collection  Time    11/16/12  3:41 PM      Result Value Range Status   Specimen Description BLOOD ARM LEFT   Final   Special Requests     Final   Value: BOTTLES DRAWN AEROBIC AND ANAEROBIC 10CC PATIENT ON FOLLOWING VANC   Culture  Setup Time 11/16/2012 22:27   Final   Culture     Final   Value:        BLOOD CULTURE RECEIVED NO GROWTH TO DATE CULTURE WILL BE HELD FOR 5 DAYS BEFORE ISSUING A FINAL NEGATIVE REPORT   Report Status PENDING   Incomplete  CULTURE, BLOOD (ROUTINE X 2)     Status: None   Collection Time    11/16/12  4:02 PM      Result Value Range Status   Specimen Description BLOOD HAND LEFT   Final   Special Requests     Final   Value:  BOTTLES DRAWN AEROBIC ONLY 5CC PATIENT ON FOLLOWING VANC   Culture  Setup Time 11/16/2012 22:26   Final   Culture     Final   Value:        BLOOD CULTURE RECEIVED NO GROWTH TO DATE CULTURE WILL BE HELD FOR 5 DAYS BEFORE ISSUING A FINAL NEGATIVE REPORT   Report Status PENDING   Incomplete     Studies: Dg Chest 2 View  11/16/2012   IMPRESSION: No edema or consolidation.  Stable cardiac prominence.   Original Report Authenticated By: Bretta Bang, M.D.   Ct Tibia Fibula Left W Contrast  11/16/2012   IMPRESSION: Extensive skin and subcutaneous calcifications.  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  No visible abscess or focal fluid collection.  No changes of acute osteomyelitis.   Original Report Authenticated By: Charlett Nose, M.D.    Scheduled Meds: . DULoxetine  30 mg Oral Daily  . [START ON 11/19/2012] fentaNYL  25 mcg Transdermal Q72H  . ferrous sulfate  325 mg Oral TID WC  . furosemide  40 mg Oral Daily  . imipenem-cilastatin  500 mg Intravenous Q6H  . insulin aspart  0-9 Units Subcutaneous TID WC  . mometasone-formoterol  2 puff Inhalation BID  . montelukast  10 mg Oral QHS  . mupirocin cream   Topical TID  . OxyCODONE  10 mg Oral Q12H  . potassium chloride SA  20 mEq Oral Daily  . simvastatin  5 mg Oral q1800  . sodium chloride  3 mL Intravenous Q12H  . topiramate  100 mg Oral BID  . warfarin  10 mg Oral ONCE-1800  . Warfarin - Pharmacist Dosing Inpatient   Does not apply q1800   Continuous Infusions: . sodium chloride 50 mL/hr at 11/18/12 1219    Principal Problem:   Cellulitis Active Problems:   DVT (deep venous thrombosis)   PVD (peripheral vascular disease)   COPD (chronic obstructive pulmonary disease)   Psoriasis   Chronic pain syndrome   Chronic anemia   DM type 2 (diabetes mellitus, type 2)    Time spent: 35 min    Jeremy [name]  [facility] Pager [phone]. If 7PM-7AM, please  contact night-coverage at [email or web address], password [facility] 11/18/2012, 4:03 PM  LOS: 2 days

## 2012-11-18 NOTE — Progress Notes (Signed)
ANTICOAGULATION CONSULT NOTE - Follow-Up Consult  Pharmacy Consult for warfarin Indication: recurrent DVT  No Known Allergies  Patient Measurements:    Vital Signs: Temp: 97.5 F (36.4 C) (07/30 0525) BP: 110/53 mmHg (07/30 0525) Pulse Rate: 56 (07/30 0525)  Labs:  Recent Labs  11/16/12 1045  11/16/12 1607 11/16/12 1850 11/16/12 2206 11/17/12 0400 11/17/12 0555 11/18/12 0606  HGB 12.4*  --   --   --   --   --  10.7*  --   HCT 35.5*  --   --   --   --   --  32.3*  --   PLT 219  --   --   --   --   --  193  --   LABPROT  --   < >  --  18.1*  --  16.9*  --  17.8*  INR  --   < >  --  1.54*  --  1.41  --  1.51*  CREATININE 1.10  --   --   --   --  0.87  --   --   TROPONINI  --   --  <0.30  --  <0.30 <0.30  --   --   < > = values in this interval not displayed.  The CrCl is unknown because both a height and weight (above a minimum accepted value) are required for this calculation.   Medical History: Past Medical History  Diagnosis Date  . Diabetes mellitus   . GERD (gastroesophageal reflux disease)   . Asthma   . Shortness of breath   . Leg pain   . Peripheral vascular disease   . Sleep apnea     uses bipap, sleep study done in [place] 2 years ago, dr Cala Bradford byrd  . Anemia   . Headache(784.0)     sinus headaches  . COPD (chronic obstructive pulmonary disease)     3L continuous  . Peripheral neuropathy   . Cancer     hx basal cell skin cancer  . Psoriasis   . 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 CHF (congestive heart failure)     does not see a cardiologist    Medications:  Prescriptions prior to admission  Medication Sig Dispense Refill  . celecoxib (CELEBREX) 200 MG capsule Take 200 mg by mouth daily.      [name] doxycycline (VIBRA-TABS) 100 MG tablet Take 100 mg by mouth 2 (two) times daily. 30 day course started 10/26/12      . DULoxetine (CYMBALTA) 30 MG capsule Take 30 mg by mouth daily.      . fentaNYL (DURAGESIC - DOSED MCG/HR) 25  MCG/HR Place 1 patch onto the skin every 3 (three) days.      . ferrous sulfate 325 (65 FE) MG tablet Take 325 mg by mouth 3 (three) times daily with meals.       . Fluticasone-Salmeterol (ADVAIR) 250-50 MCG/DOSE AEPB Inhale 1 puff into the lungs daily.      . furosemide (LASIX) 40 MG tablet Take 40 mg by mouth daily.       [name] levalbuterol (XOPENEX HFA) 45 MCG/ACT inhaler Inhale 2 puffs into the lungs daily as needed for wheezing or shortness of breath.       . montelukast (SINGULAIR) 10 MG tablet Take 10 mg by mouth at bedtime.       [name] oxyCODONE (OXY IR/ROXICODONE) 5 MG immediate release tablet Take 5 mg  by mouth 5 (five) times daily as needed for pain.       . potassium chloride SA (K-DUR,KLOR-CON) 20 MEQ tablet Take 20 mEq by mouth daily.      . pravastatin (PRAVACHOL) 10 MG tablet Take 10 mg by mouth at bedtime.       . topiramate (TOPAMAX) 100 MG tablet Take 100 mg by mouth 2 (two) times daily. Also takes 50 mg daily at noon      . Vitamin D, Ergocalciferol, (DRISDOL) 50000 UNITS CAPS Take 50,000 Units by mouth every 7 (seven) days. Sunday      . warfarin (COUMADIN) 5 MG tablet Take 7.5 mg by mouth daily.      [name] albuterol (PROVENTIL) (2.5 MG/3ML) 0.083% nebulizer solution Take 2.5 mg by nebulization daily as needed for wheezing or shortness of breath.         Assessment: 62 yom on chronic coumadin for hx DVT. INR is subtherapeutic 1.51. H/H 10.7/32.3, plts 193, no bleeding noted. INR subtherapeutic upon presentation. MD to evaluate need for possible lovenox/heparin bridge.   Goal of Therapy:  INR 2-3   Plan:  1. Warfarin 10mg  PO x 1 tonight 2. Daily INR 3. F/u need for lovenox or heparin bridge?  Tad Moore, BCPS  Clinical Pharmacist Pager [phone]  11/18/2012 1:16 PM

## 2012-11-18 NOTE — Progress Notes (Signed)
Patient ID: Jeremy [name], male   DOB: 12-25-1950, 62 y.o.   MRN: [id number] Dressing changed this morning. Patient's granulation tissue skin graft and the wound bed appears significantly improved over 24 hours. The cellulitis and dermatitis shows excellent improvement.  Patient is starting to develop more frequent infections in his leg due to the chronic nature of the wound.  Patient's wife states that his pain is worsening and that the pain control clinic will not provide him enough pain medication for him to be comfortable. Patient and his wife states that he is miserable and unable to weight-bear do to the pain.  Patient's wife inquires about a transtibial amputation. We had a long discussion and I feel that this would be the patient's best option. It should relieve his pain decrease the bacterial load on his body. This should decrease the stress on his body from a chronic ulceration. Anticipate patient would need to be in skilled nursing postoperatively. Discussed that the wound tissue is in the surgical field for a transtibial amputation that healing of the transtibial amputation will be difficult and prolonged but should be better than patient's current treatment wound care. Anticipate that we could proceed with surgery next week. Would like to have patient's IV antibiotics completed for one week prior to proceeding with surgery.  Patient and his wife will discuss the transtibial amputation option.

## 2012-11-19 DIAGNOSIS — E1159 Type 2 diabetes mellitus with other circulatory complications: Secondary | ICD-10-CM

## 2012-11-19 DIAGNOSIS — J449 Chronic obstructive pulmonary disease, unspecified: Secondary | ICD-10-CM

## 2012-11-19 DIAGNOSIS — I798 Other disorders of arteries, arterioles and capillaries in diseases classified elsewhere: Secondary | ICD-10-CM

## 2012-11-19 LAB — GLUCOSE, CAPILLARY
Glucose-Capillary: 129 mg/dL — ABNORMAL HIGH (ref 70–99)
Glucose-Capillary: 144 mg/dL — ABNORMAL HIGH (ref 70–99)

## 2012-11-19 LAB — PROTIME-INR
INR: 1.49 (ref 0.00–1.49)
Prothrombin Time: 17.6 seconds — ABNORMAL HIGH (ref 11.6–15.2)

## 2012-11-19 MED ORDER — DEXTROSE 5 % IV SOLN
2.0000 g | Freq: Two times a day (BID) | INTRAVENOUS | Status: DC
  Administered 2012-11-19 – 2012-11-20 (×3): 2 g via INTRAVENOUS
  Filled 2012-11-19 (×5): qty 2

## 2012-11-19 MED ORDER — DEXTROSE 5 % IV SOLN
1.0000 g | Freq: Three times a day (TID) | INTRAVENOUS | Status: DC
  Filled 2012-11-19 (×2): qty 1

## 2012-11-19 MED ORDER — VANCOMYCIN HCL IN DEXTROSE 1-5 GM/200ML-% IV SOLN
1000.0000 mg | Freq: Two times a day (BID) | INTRAVENOUS | Status: DC
  Administered 2012-11-20 (×2): 1000 mg via INTRAVENOUS
  Filled 2012-11-19 (×3): qty 200

## 2012-11-19 MED ORDER — OXYCODONE HCL ER 15 MG PO T12A
15.0000 mg | EXTENDED_RELEASE_TABLET | Freq: Two times a day (BID) | ORAL | Status: DC
  Administered 2012-11-19 – 2012-11-20 (×2): 15 mg via ORAL
  Filled 2012-11-19 (×2): qty 1

## 2012-11-19 MED ORDER — WARFARIN SODIUM 2.5 MG PO TABS
12.5000 mg | ORAL_TABLET | Freq: Once | ORAL | Status: AC
  Administered 2012-11-19: 12.5 mg via ORAL
  Filled 2012-11-19: qty 1

## 2012-11-19 MED ORDER — VANCOMYCIN HCL 10 G IV SOLR
2000.0000 mg | Freq: Once | INTRAVENOUS | Status: AC
  Administered 2012-11-19: 2000 mg via INTRAVENOUS
  Filled 2012-11-19: qty 2000

## 2012-11-19 NOTE — Progress Notes (Signed)
[facility]  Patient Status:   Mr. Bunton is not currently active with [facility], however, he has had home infusion services with [place] in April 2014.  [facility] is providing the following services:  Pt will have home infusion services from [facility].  [facility] will provide [facility] RN.  [facility] hospital team will help coordinate DC home with agency.   If patient discharges after hours, please call [phone].   Sedalia Muta 11/19/2012, 4:56 PM

## 2012-11-19 NOTE — Progress Notes (Signed)
[facility] PROGRESS NOTE  Jeremy [name] ZOX:[id number] DOB: August 13, 1950 DOA: 11/16/2012 PCP: Provider Not In System  Assessment/Plan: Principal Problem:   Cellulitis: Seen by or so. Plan is for amputation. Orthopedic surgery when I get about x1 week. Trying to arrange for coverage with home health, will try to find out when patient surgery is scheduled  Active Problems:   DVT (deep venous thrombosis): INR still subtherapeutic, pharmacy managing    PVD (peripheral vascular disease)    COPD (chronic obstructive pulmonary disease): Stable.   Psoriasis    Chronic pain syndrome: Discussed with patient, and he'll take when necessary pain medication. Responding well to OxyContin extended release. Titrate up to 15 mg every 12 hours to try to limit breakthrough pain needs    Chronic anemia   DM type 2 (diabetes mellitus, type 2): CBG surprisingly stable   Code Status: Full code  Family Communication: Plan discussed with patient's wife at the bedside.  Disposition Plan: Initially plan had been for patient to go home on ID antibiotics x1 week,    Consultants:  Duda-orthopedic surgery  Procedures:  None  Antibiotics:  IV Primaxin day 3 started 7/28-stopped 7/31  IV vancomycin x1 dose on 7/28, restarted on 7/31  IV cefepime started 7/31  HPI/Subjective: Patient doing better today, pain better controlled. Slightly fatigued  Objective: Filed Vitals:   11/18/12 1435 11/18/12 2023 11/19/12 0624 11/19/12 0753  BP: 110/52  119/52   Pulse: 54  63   Temp: 97.8 F (36.6 C)  98.5 F (36.9 C)   TempSrc:   Oral   Resp: 18  16   SpO2: 100% 100% 100% 100%    Intake/Output Summary (Last 24 hours) at 11/19/12 1153 Last data filed at 11/19/12 0900  Gross per 24 hour  Intake    480 ml  Output    500 ml  Net    -20 ml   Filed Vitals:   11/19/12 0624  BP: 119/52  Pulse: 63  Temp: 98.5 F (36.9 C)  Resp: 16    Exam:   General:  Alert and oriented x3, no acute  distress  Cardiovascular: Regular rate and rhythm, S1-S2  Respiratory: Decreased breath sounds throughout  Abdomen: Soft nontender, nondistended, obese, positive bowel sounds  Musculoskeletal: Left lower extremity with 2+ edema, chronic venous stasis, wrapped with wound with minimal drainage   Data Reviewed: Basic Metabolic Panel:  Recent Labs Lab 11/16/12 1045 11/17/12 0400  NA 140 139  K 3.3* 3.9  CL 107 108  CO2 25 22  GLUCOSE 185* 89  BUN 20 17  CREATININE 1.10 0.87  CALCIUM 8.7 8.5   Liver Function Tests:  Recent Labs Lab 11/17/12 0400  AST 12  ALT 9  ALKPHOS 95  BILITOT 0.3  PROT 6.4  ALBUMIN 2.7*   CBC:  Recent Labs Lab 11/16/12 1045 11/17/12 0555  WBC 7.5 7.1  NEUTROABS 5.0  --   HGB 12.4* 10.7*  HCT 35.5* 32.3*  MCV 94.2 94.7  PLT 219 193   Cardiac Enzymes:  Recent Labs Lab 11/16/12 1607 11/16/12 2206 11/17/12 0400  TROPONINI <0.30 <0.30 <0.30   CBG:  Recent Labs Lab 11/18/12 0708 11/18/12 1051 11/18/12 1612 11/18/12 2048 11/19/12 0613  GLUCAP 97 176* 90 165* 95    Recent Results (from the past 240 hour(s))  CULTURE, BLOOD (ROUTINE X 2)     Status: None   Collection Time    11/16/12  3:41 PM      Result Value  Range Status   Specimen Description BLOOD ARM LEFT   Final   Special Requests     Final   Value: BOTTLES DRAWN AEROBIC AND ANAEROBIC 10CC PATIENT ON FOLLOWING VANC   Culture  Setup Time 11/16/2012 22:27   Final   Culture     Final   Value:        BLOOD CULTURE RECEIVED NO GROWTH TO DATE CULTURE WILL BE HELD FOR 5 DAYS BEFORE ISSUING A FINAL NEGATIVE REPORT   Report Status PENDING   Incomplete  CULTURE, BLOOD (ROUTINE X 2)     Status: None   Collection Time    11/16/12  4:02 PM      Result Value Range Status   Specimen Description BLOOD HAND LEFT   Final   Special Requests     Final   Value: BOTTLES DRAWN AEROBIC ONLY 5CC PATIENT ON FOLLOWING VANC   Culture  Setup Time 11/16/2012 22:26   Final   Culture     Final    Value:        BLOOD CULTURE RECEIVED NO GROWTH TO DATE CULTURE WILL BE HELD FOR 5 DAYS BEFORE ISSUING A FINAL NEGATIVE REPORT   Report Status PENDING   Incomplete     Studies: Dg Chest 2 View  11/16/2012   IMPRESSION: No edema or consolidation.  Stable cardiac prominence.   Original Report Authenticated By: Bretta Bang, M.D.   Ct Tibia Fibula Left W Contrast  11/16/2012   IMPRESSION: Extensive skin and subcutaneous calcifications.  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  No visible abscess or focal fluid collection.  No changes of acute osteomyelitis.   Original Report Authenticated By: Charlett Nose, M.D.    Scheduled Meds: . ceFEPime (MAXIPIME) IV  1 g Intravenous Q8H  . DULoxetine  30 mg Oral Daily  . fentaNYL  25 mcg Transdermal Q72H  . ferrous sulfate  325 mg Oral TID WC  . furosemide  40 mg Oral Daily  . insulin aspart  0-9 Units Subcutaneous TID WC  . mometasone-formoterol  2 puff Inhalation BID  . montelukast  10 mg Oral QHS  . mupirocin cream   Topical TID  . OxyCODONE  10 mg Oral Q12H  . potassium chloride SA  20 mEq Oral Daily  . simvastatin  5 mg Oral q1800  . sodium chloride  3 mL Intravenous Q12H  . topiramate  100 mg Oral BID  . vancomycin  2,000 mg Intravenous Once  . [START ON 11/20/2012] vancomycin  1,000 mg Intravenous Q12H  . warfarin  12.5 mg Oral ONCE-1800  . Warfarin - Pharmacist Dosing Inpatient   Does not apply q1800   Continuous Infusions: . sodium chloride 50 mL/hr at 11/18/12 1219    Principal Problem:   Cellulitis Active Problems:   DVT (deep venous thrombosis)   PVD (peripheral vascular disease)   COPD (chronic obstructive pulmonary disease)   Psoriasis   Chronic pain syndrome   Chronic anemia   Diabetes mellitus with peripheral vascular disease    Time spent: 35 min    [name]  [facility] Pager [phone]. If 7PM-7AM, please contact night-coverage at  [email or web address], password [facility] 11/19/2012, 11:53 AM  LOS: 3 days

## 2012-11-19 NOTE — ED Provider Notes (Signed)
Medical screening examination/treatment/procedure(s) were conducted as a shared visit with non-physician practitioner(s) and myself.  I personally evaluated the patient during the encounter Pt with recent skin graft which now has erythema/induration and warmth around the graft concerning for cellulitis. Admitted for IV abx.  Gwyneth Sprout, MD 11/19/12 1556

## 2012-11-19 NOTE — Progress Notes (Signed)
ANTICOAGULATION CONSULT NOTE - Follow-Up Consult  Pharmacy Consult for warfarin Indication: recurrent DVT  No Known Allergies  Patient Measurements:    Vital Signs: Temp: 98.5 F (36.9 C) (07/31 0624) Temp src: Oral (07/31 0624) BP: 119/52 mmHg (07/31 0624) Pulse Rate: 63 (07/31 0624)  Labs:  Recent Labs  11/16/12 1607  11/16/12 2206 11/17/12 0400 11/17/12 0555 11/18/12 0606 11/19/12 0425  HGB  --   --   --   --  10.7*  --   --   HCT  --   --   --   --  32.3*  --   --   PLT  --   --   --   --  193  --   --   LABPROT  --   < >  --  16.9*  --  17.8* 17.6*  INR  --   < >  --  1.41  --  1.51* 1.49  CREATININE  --   --   --  0.87  --   --   --   TROPONINI <0.30  --  <0.30 <0.30  --   --   --   < > = values in this interval not displayed.  The CrCl is unknown because both a height and weight (above a minimum accepted value) are required for this calculation.  Assessment: 62 yom on chronic coumadin for hx DVT. INR is subtherapeutic 1.49 without much increase with 2 doses of 10mg  of coumadin. H/H 10.7/32.3, plts 193 as of 7/29, no bleeding noted. INR subtherapeutic upon presentation. MD to evaluate need for possible lovenox/heparin bridge.   Goal of Therapy:  INR 2-3   Plan:  1. Warfarin 12.5mg  PO x 1 tonight 2. F/u AM INR 3. F/u need for lovenox or heparin bridge?  Lysle Pearl, PharmD, BCPS Pager # [phone] 11/19/2012 11:26 AM

## 2012-11-19 NOTE — Progress Notes (Addendum)
ANTIBIOTIC CONSULT NOTE - INITIAL  Pharmacy Consult for vancomycin + cefepime Indication: cellulitis  No Known Allergies  Patient Measurements:    Vital Signs: Temp: 98.5 F (36.9 C) (07/31 0624) Temp src: Oral (07/31 0624) BP: 119/52 mmHg (07/31 0624) Pulse Rate: 63 (07/31 0624) Intake/Output from previous day: 07/30 0701 - 07/31 0700 In: 240 [P.O.:240] Out: 500 [Urine:500] Intake/Output from this shift: Total I/O In: 240 [P.O.:240] Out: -   Labs:  Recent Labs  11/17/12 0400 11/17/12 0555  WBC  --  7.1  HGB  --  10.7*  PLT  --  193  CREATININE 0.87  --    The CrCl is unknown because both a height and weight (above a minimum accepted value) are required for this calculation. No results found for this basename: VANCOTROUGH, Leodis Binet, VANCORANDOM, GENTTROUGH, GENTPEAK, GENTRANDOM, TOBRATROUGH, TOBRAPEAK, TOBRARND, AMIKACINPEAK, AMIKACINTROU, AMIKACIN,  in the last 72 hours   Microbiology: Recent Results (from the past 720 hour(s))  SURGICAL PCR SCREEN     Status: None   Collection Time    11/06/12 12:07 PM      Result Value Range Status   MRSA, PCR NEGATIVE  NEGATIVE Final   Staphylococcus aureus NEGATIVE  NEGATIVE Final   Comment:            The Xpert SA Assay (FDA     approved for NASAL specimens     in patients over 45 years of age),     is one component of     a comprehensive surveillance     program.  Test performance has     been validated by [employer] for patients greater     than or equal to 75 year old.     It is not intended     to diagnose infection nor to     guide or monitor treatment.  CULTURE, BLOOD (ROUTINE X 2)     Status: None   Collection Time    11/16/12  3:41 PM      Result Value Range Status   Specimen Description BLOOD ARM LEFT   Final   Special Requests     Final   Value: BOTTLES DRAWN AEROBIC AND ANAEROBIC 10CC PATIENT ON FOLLOWING VANC   Culture  Setup Time 11/16/2012 22:27   Final   Culture     Final   Value:         BLOOD CULTURE RECEIVED NO GROWTH TO DATE CULTURE WILL BE HELD FOR 5 DAYS BEFORE ISSUING A FINAL NEGATIVE REPORT   Report Status PENDING   Incomplete  CULTURE, BLOOD (ROUTINE X 2)     Status: None   Collection Time    11/16/12  4:02 PM      Result Value Range Status   Specimen Description BLOOD HAND LEFT   Final   Special Requests     Final   Value: BOTTLES DRAWN AEROBIC ONLY 5CC PATIENT ON FOLLOWING VANC   Culture  Setup Time 11/16/2012 22:26   Final   Culture     Final   Value:        BLOOD CULTURE RECEIVED NO GROWTH TO DATE CULTURE WILL BE HELD FOR 5 DAYS BEFORE ISSUING A FINAL NEGATIVE REPORT   Report Status PENDING   Incomplete    Medical History: Past Medical History  Diagnosis Date  . Diabetes mellitus   . GERD (gastroesophageal reflux disease)   . Asthma   . Shortness of breath   .  Leg pain   . Peripheral vascular disease   . Sleep apnea     uses bipap, sleep study done in [place] 2 years ago, dr Cala Bradford byrd  . Anemia   . Headache(784.0)     sinus headaches  . COPD (chronic obstructive pulmonary disease)     3L continuous  . Peripheral neuropathy   . Cancer     hx basal cell skin cancer  . Psoriasis   . 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 CHF (congestive heart failure)     does not see a cardiologist    Medications:  Anti-infectives   Start     Dose/Rate Route Frequency Ordered Stop   11/20/12 0100  vancomycin (VANCOCIN) IVPB 1000 mg/200 mL premix     1,000 mg 200 mL/hr over 60 Minutes Intravenous Every 12 hours 11/19/12 1127     11/19/12 1200  vancomycin (VANCOCIN) 2,000 mg in sodium chloride 0.9 % 500 mL IVPB     2,000 mg 250 mL/hr over 120 Minutes Intravenous  Once 11/19/12 1127     11/19/12 1200  ceFEPIme (MAXIPIME) 1 g in dextrose 5 % 50 mL IVPB     1 g 100 mL/hr over 30 Minutes Intravenous Every 8 hours 11/19/12 1127     11/16/12 2300  imipenem-cilastatin (PRIMAXIN) 500 mg in sodium chloride 0.9 % 100 mL IVPB  Status:  Discontinued      500 mg 200 mL/hr over 30 Minutes Intravenous Every 6 hours 11/16/12 1606 11/19/12 1109   11/16/12 1615  imipenem-cilastatin (PRIMAXIN) 500 mg in sodium chloride 0.9 % 100 mL IVPB     500 mg 200 mL/hr over 30 Minutes Intravenous NOW 11/16/12 1606 11/16/12 1801   11/16/12 1500  vancomycin (VANCOCIN) IVPB 1000 mg/200 mL premix     1,000 mg 200 mL/hr over 60 Minutes Intravenous  Once 11/16/12 1457 11/16/12 1618     Assessment: 62 yom presented do the hospital with cellulitis and was initially started on primaxin. Today antibiotics are being switched to vancomycin and cefepime. Pt is currently afebrile and WBC was WNL as of 7/29. Scr was also WNL as of 7/29.   Primaxin 7/28>>7/31 Vanc x 1 7/28; 7/31>> Cefepime 7/31>>  7/28 Blood - NGTD  Goal of Therapy:  Vancomycin trough level 10-15 mcg/ml  Plan:  1. Vancomycin 2gm IV x 1 then 1gm IV Q12H 2. Cefepime 1gm IV Q8H 3. F/u renal fxn, C&S, clinical status and trough at [facility]  Juliany Daughety, Drake Leach 11/19/2012,11:28 AM  Addendum: Planning to send patient home soon so physician requests simplifying antibiotic dosing regimen.   Plan: 1. Change cefepime to 2gm IV Q12H 2. F/u renal fxn, C&S, clinical status  Lysle Pearl, PharmD, BCPS Pager # [phone] 11/19/2012 12:01 PM

## 2012-11-20 DIAGNOSIS — R7881 Bacteremia: Secondary | ICD-10-CM | POA: Diagnosis present

## 2012-11-20 LAB — GLUCOSE, CAPILLARY
Glucose-Capillary: 97 mg/dL (ref 70–99)
Glucose-Capillary: 98 mg/dL (ref 70–99)
Glucose-Capillary: 125 mg/dL — ABNORMAL HIGH (ref 70–99)

## 2012-11-20 LAB — BASIC METABOLIC PANEL
BUN: 11 mg/dL (ref 6–23)
CO2: 28 mEq/L (ref 19–32)
Calcium: 8.4 mg/dL (ref 8.4–10.5)
GFR calc non Af Amer: >90 mL/min (ref >90)
Glucose, Bld: 96 mg/dL (ref 70–99)
Sodium: 143 mEq/L (ref 135–145)

## 2012-11-20 MED ORDER — WARFARIN SODIUM 10 MG PO TABS
10.0000 mg | ORAL_TABLET | Freq: Once | ORAL | Status: DC
  Filled 2012-11-20: qty 1

## 2012-11-20 MED ORDER — VANCOMYCIN HCL IN DEXTROSE 1-5 GM/200ML-% IV SOLN: 1000.0000 mg | Freq: Two times a day (BID) | INTRAVENOUS | Status: DC

## 2012-11-20 MED ORDER — DEXTROSE 5 % IV SOLN: 2.0000 g | Freq: Two times a day (BID) | INTRAVENOUS | Status: DC

## 2012-11-20 MED ORDER — HEPARIN SOD (PORK) LOCK FLUSH 100 UNIT/ML IV SOLN
250.0000 [IU] | INTRAVENOUS | Status: DC | PRN
  Administered 2012-11-20: 500 [IU]
  Filled 2012-11-20: qty 3

## 2012-11-20 MED ORDER — WARFARIN SODIUM 10 MG PO TABS
10.0000 mg | ORAL_TABLET | ORAL | Status: AC
  Administered 2012-11-20: 10 mg via ORAL
  Filled 2012-11-20: qty 1

## 2012-11-20 MED ORDER — OXYCODONE HCL 5 MG PO TABS: 5.0000 mg | ORAL_TABLET | Freq: Every day | ORAL | Status: DC | PRN

## 2012-11-20 MED ORDER — OXYCODONE HCL ER 15 MG PO T12A: 15.0000 mg | EXTENDED_RELEASE_TABLET | Freq: Two times a day (BID) | ORAL | Status: DC

## 2012-11-20 MED ORDER — SODIUM CHLORIDE 0.9 % IJ SOLN
10.0000 mL | INTRAMUSCULAR | Status: DC | PRN
  Administered 2012-11-20: 10 mL

## 2012-11-20 MED ORDER — FENTANYL 25 MCG/HR TD PT72: 1.0000 | MEDICATED_PATCH | TRANSDERMAL | Status: DC

## 2012-11-20 MED ORDER — HEPARIN SOD (PORK) LOCK FLUSH 100 UNIT/ML IV SOLN
250.0000 [IU] | Freq: Every day | INTRAVENOUS | Status: DC
  Filled 2012-11-20: qty 3

## 2012-11-20 NOTE — Progress Notes (Signed)
Peripherally Inserted Central Catheter/Midline Placement  The IV Nurse has discussed with the patient and/or persons authorized to consent for the patient, the purpose of this procedure and the potential benefits and risks involved with this procedure.  The benefits include less needle sticks, lab draws from the catheter and patient may be discharged home with the catheter.  Risks include, but not limited to, infection, bleeding, blood clot (thrombus formation), and puncture of an artery; nerve damage and irregular heat beat.  Alternatives to this procedure were also discussed.  PICC/Midline Placement Documentation  PICC / Midline Single Lumen 07/31/12 PICC Right Basilic (Active)     PICC / Midline Single Lumen 08/17/12 PICC Right Brachial (Active)       Franne Grip Renee 11/20/2012, 9:15 AM

## 2012-11-20 NOTE — Progress Notes (Signed)
Patient ID: Jeremy [name], male   DOB: 1950/08/12, 62 y.o.   MRN: [id number] Patient anticipates discharge to home today. Patient is scheduled for transtibial amputation for Wednesday.

## 2012-11-20 NOTE — Discharge Summary (Signed)
Physician Discharge Summary  Jeremy [name]:[id number] DOB: [birth year] DOA: 11/16/2012  PCP: Provider Not In System  Admit date: 11/16/2012 Discharge date: 11/20/2012  Time spent: 35 minutes  Recommendations for Outpatient Follow-up:  1. Continue daily Bactroban dressing changes twice a day plus IV antibiotics cefepime and vancomycin twice a day for the next week. 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 The patient had an ulcer on the left lower extremity and status post irrigation and debridement and application of allograft split thickness skin graft wound area 15 x 10 cm, on 7/18. The patient was discharged on 7/22 by mouth doxycycline. He was discharged with home health.. Patient comes in today with left leg pain, home health noted increased redness, no fever no chills, and denies any other cardiopulmonary symptoms   Hospital Course and Discharge Diagnoses:  Principal Problem:   Cellulitis: Patient evaluated by orthopedic surgery. He has been followed by orthopedics for quite some time and after discussion with the plan for transtibial amputation. With that except he would benefit from one week of IV antibiotics prior to surgery. Arrangements are made after several days, patient had a PICC line placed and discharged on IV cefepime 2 g every 12 hours and IV vancomycin 1 g every 12 hours. Initially the plan was to continue this x1 week. Given positive blood cultures, would recommend continuing IV vancomycin for an additional week. Surgery is planned for next Wednesday.  Active  Problems:   DVT (deep venous thrombosis): Patient's last DVT was quite a number of years ago. INR slightly subtherapeutic on admission. Coumadin per pharmacy and he was given increased doses. INR on day of discharge is 1.74.    PVD (peripheral vascular disease)   COPD (chronic obstructive pulmonary disease): Stable. Continued on when necessary meds.   Psoriasis   Chronic pain syndrome: Patient on when necessary OxyIR. Not much pain relief. Started him on on Oxy extended release 50 mg every 12 hours with when necessary OxyIR for breakthrough pain. This seemed to greatly improved the patient's pain control.    Chronic anemia    Diabetes mellitus with peripheral vascular disease Bacteremia: Soon after discharge, patient's blood cultures came back gram-positive cocci in clusters, consistent with Staph. Patient already on IV vancomycin. Recommend continuing for a total of 2 weeks of therapy  Procedures:  None  Consultations:  Duda-orthopedics  Discharge Exam: Filed Vitals:   11/19/12 1956 11/19/12 2040 11/20/12 0634 11/20/12 1319  BP: 109/61  111/60 121/60  Pulse: 67  66 59  Temp: 98.3 F (36.8 C)  98.2 F (36.8 C) 97.9 F (36.6 C)  TempSrc: Oral  Oral Oral  Resp: 16  16 16   Height:      Weight:      SpO2: 100% 100% 100% 100%    General: Alert alert and oriented x3, no acute distress Cardiovascular: Regular rate and rhythm, S1-S2 Respiratory: Clear to auscultation bilaterally  Discharge Instructions  Discharge Orders   Future Orders Complete By Expires     Diet - low sodium heart healthy  As directed     Diet  Carb Modified  As directed     Discharge wound care:  As directed     Comments:      Change dressing with Bactroban ointment applied to the wound twice a day    Increase activity slowly  As directed         Medication List    STOP taking these medications       celecoxib 200 MG capsule  Commonly known as:  CELEBREX     doxycycline 100 MG tablet  Commonly  known as:  VIBRA-TABS      TAKE these medications       albuterol (2.5 MG/3ML) 0.083% nebulizer solution  Commonly known as:  PROVENTIL  Take 2.5 mg by nebulization daily as needed for wheezing or shortness of breath.     dextrose 5 % SOLN 50 mL with ceFEPIme 2 G SOLR 2 g  Inject 2 g into the vein every 12 (twelve) hours.  Start taking on:  11/21/2012     DULoxetine 30 MG capsule  Commonly known as:  CYMBALTA  Take 30 mg by mouth daily.     fentaNYL 25 MCG/HR  Commonly known as:  DURAGESIC - dosed mcg/hr  Place 1 patch (25 mcg total) onto the skin every 3 (three) days.     ferrous sulfate 325 (65 FE) MG tablet  Take 325 mg by mouth 3 (three) times daily with meals.     Fluticasone-Salmeterol 250-50 MCG/DOSE Aepb  Commonly known as:  ADVAIR  Inhale 1 puff into the lungs daily.     furosemide 40 MG tablet  Commonly known as:  LASIX  Take 40 mg by mouth daily.     montelukast 10 MG tablet  Commonly known as:  SINGULAIR  Take 10 mg by mouth at bedtime.     OxyCODONE 15 mg T12a 12 hr tablet  Commonly known as:  OXYCONTIN  Take 1 tablet (15 mg total) by mouth every 12 (twelve) hours.     oxyCODONE 5 MG immediate release tablet  Commonly known as:  Oxy IR/ROXICODONE  Take 1 tablet (5 mg total) by mouth 5 (five) times daily as needed for pain.     potassium chloride SA 20 MEQ tablet  Commonly known as:  K-DUR,KLOR-CON  Take 20 mEq by mouth daily.     pravastatin 10 MG tablet  Commonly known as:  PRAVACHOL  Take 10 mg by mouth at bedtime.     topiramate 100 MG tablet  Commonly known as:  TOPAMAX  Take 100 mg by mouth 2 (two) times daily. Also takes 50 mg daily at noon     vancomycin 1 GM/200ML Soln  Commonly known as:  VANCOCIN  Inject 200 mLs (1,000 mg total) into the vein every 12 (twelve) hours.     Vitamin D (Ergocalciferol) 50000 UNITS Caps  Commonly known as:  DRISDOL  Take 50,000 Units by mouth every 7 (seven) days. Sunday     warfarin 5 MG tablet   Commonly known as:  COUMADIN  Take 7.5 mg by mouth daily.     XOPENEX HFA 45 MCG/ACT inhaler  Generic drug:  levalbuterol  Inhale 2 puffs into the lungs daily as needed for wheezing or shortness of breath.       No Known Allergies     Follow-up Information   Follow up with Nadara Mustard, MD. (Next Wed for OR)    Contact information:   [address] [place] [address] [phone]  The results of significant diagnostics from this hospitalization (including imaging, microbiology, ancillary and laboratory) are listed below for reference.    Significant Diagnostic Studies: Dg Chest 2 View  11/16/2012  .  IMPRESSION: No edema or consolidation.  Stable cardiac prominence.   Original Report Authenticated By: Bretta Bang, M.D.   Ct Tibia Fibula Left W Contrast  11/16/2012     IMPRESSION: Extensive skin and subcutaneous calcifications.  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  No visible abscess or focal fluid collection.  No changes of acute osteomyelitis.   Original Report Authenticated By: Charlett Nose, M.D.    Microbiology: Recent Results (from the past 240 hour(s))  CULTURE, BLOOD (ROUTINE X 2)     Status: None   Collection Time    11/16/12  3:41 PM      Result Value Range Status   Specimen Description BLOOD ARM LEFT   Final   Special Requests     Final   Value: BOTTLES DRAWN AEROBIC AND ANAEROBIC 10CC PATIENT ON FOLLOWING VANC   Culture  Setup Time 11/16/2012 22:27   Final   Culture     Final   Value: GRAM POSITIVE COCCI IN CLUSTERS     Note: Gram Stain Report Called to,Read Back By and Verified With: ADRIAN JACOBS @ 1417 11/20/12 BY KRAWS   Report Status PENDING   Incomplete  CULTURE, BLOOD (ROUTINE X 2)     Status: None   Collection Time    11/16/12  4:02 PM      Result Value Range Status   Specimen Description BLOOD HAND LEFT   Final   Special Requests     Final   Value: BOTTLES DRAWN  AEROBIC ONLY 5CC PATIENT ON FOLLOWING VANC   Culture  Setup Time 11/16/2012 22:26   Final   Culture     Final   Value:        BLOOD CULTURE RECEIVED NO GROWTH TO DATE CULTURE WILL BE HELD FOR 5 DAYS BEFORE ISSUING A FINAL NEGATIVE REPORT   Report Status PENDING   Incomplete     Labs: Basic Metabolic Panel:  Recent Labs Lab 11/16/12 1045 11/17/12 0400 11/20/12 0535  NA 140 139 143  K 3.3* 3.9 3.6  CL 107 108 109  CO2 25 22 28   GLUCOSE 185* 89 96  BUN 20 17 11   CREATININE 1.10 0.87 0.86  CALCIUM 8.7 8.5 8.4   Liver Function Tests:  Recent Labs Lab 11/17/12 0400  AST 12  ALT 9  ALKPHOS 95  BILITOT 0.3  PROT 6.4  ALBUMIN 2.7*   CBC:  Recent Labs Lab 11/16/12 1045 11/17/12 0555  WBC 7.5 7.1  NEUTROABS 5.0  --   HGB 12.4* 10.7*  HCT 35.5* 32.3*  MCV 94.2 94.7  PLT 219 193   Cardiac Enzymes:  Recent Labs Lab 11/16/12 1607 11/16/12 2206 11/17/12 0400  TROPONINI <0.30 <0.30 <0.30   CBG:  Recent Labs Lab 11/19/12 1152 11/19/12 1613 11/19/12 2100 11/20/12 0628 11/20/12 1054  GLUCAP 129* 144* 109* 97 98       Signed:  Bena Kobel K  [facility] 11/20/2012, 4:15 PM

## 2012-11-20 NOTE — Progress Notes (Signed)
ANTICOAGULATION CONSULT NOTE - Follow-Up Consult  Pharmacy Consult for warfarin Indication: recurrent DVT  No Known Allergies  Patient Measurements: Height: 5\' 10"  (177.8 cm) Weight: 272 lb (123.378 kg) IBW/kg (Calculated) : 73  Vital Signs: Temp: 98.2 F (36.8 C) (08/01 0634) Temp src: Oral (08/01 0634) BP: 111/60 mmHg (08/01 0634) Pulse Rate: 66 (08/01 0634)  Labs:  Recent Labs  11/18/12 0606 11/19/12 0425 11/20/12 0535  LABPROT 17.8* 17.6* 20.3*  INR 1.51* 1.49 1.79*  CREATININE  --   --  0.86    Estimated Creatinine Clearance: 118.9 ml/min (by C-G formula based on Cr of 0.86).  Assessment: 62 yom on chronic coumadin for hx DVT. INR is subtherapeutic at 1.79 but now starting to come up. H/H 10.7/32.3, plts 193 as of 7/29, no bleeding noted. INR subtherapeutic upon presentation.   Goal of Therapy:  INR 2-3   Plan:  1. Warfarin 10mg  PO x 1 tonight - may need higher dose upon discharge since he presented with a subtherapeutic INR. If discharged today, would give 10mg  today then resume 7.5mg  on Saturday and Sunday with an INR check on Monday 8/4 2. F/u AM INR if still admitted  Lysle Pearl, PharmD, BCPS Pager # [phone] 11/20/2012 10:42 AM

## 2012-11-20 NOTE — Progress Notes (Signed)
11/20/12 Spoke with patient and his wife on 11/19/12 and confirmed that they wanted to continue HHC with [facility] .They also wanted to use [facility] for the home IV antibiotics as they had in the past.Contacted [facility] at [facility] and set up home IV antibiotics. Spoke with Lafonda Mosses from [facility] and set up [facility] for IV antibiotics and wound care. Today faxed [facility] order with flush orders and face to face to Lafonda Mosses at [phone] and received confirmation. Per Pam with [facility] antibiotic ordersand wound care instructions have been faxed to Aneta Mins she will fax d/c summary to [facility] on 11/21/12. [facility] will see patient at [facility] 11/21/12.   Jacquelynn Cree RN, BSN, CCM

## 2012-11-21 LAB — CULTURE, BLOOD (ROUTINE X 2)

## 2012-11-22 LAB — CULTURE, BLOOD (ROUTINE X 2): Culture: NO GROWTH

## 2012-11-23 ENCOUNTER — Encounter (HOSPITAL_COMMUNITY): Payer: Self-pay | Admitting: *Deleted

## 2012-11-23 NOTE — Progress Notes (Addendum)
Pt denies SOB, chest pain, and being under the care of a cardiologist. Pt had an echo, stress test done several years ago (2-3) in [place], [place] and cardiac cath in 1985 in [place], [place]. Pt wears 3 liters of oxygen at all times. Pt positive for sleep apnea and wears a Bipap; pt unsure of settings. Pt advised to bring Bipap mask the DOS along with Xopenex  ( rescue inhaler for SOB and wheezing). Spoke with Elnita Maxwell from Dr. Audrie Lia office, she stated that is was okay for pt to continue taking Coumadin as ordered. Pt made aware to stop taking Aspirin and herbal medications. Do not take any NSAIDs ie: Ibuprofen, Advil, Naproxen or any medication containing Aspirin.

## 2012-11-24 ENCOUNTER — Other Ambulatory Visit (HOSPITAL_COMMUNITY): Payer: Self-pay | Admitting: Orthopedic Surgery

## 2012-11-25 ENCOUNTER — Encounter (HOSPITAL_COMMUNITY): Payer: Self-pay | Admitting: *Deleted

## 2012-11-25 ENCOUNTER — Inpatient Hospital Stay (HOSPITAL_COMMUNITY)
Admission: RE | Admit: 2012-11-25 | Discharge: 2012-11-27 | DRG: 580 | Disposition: A | Discharge status: Rehabilitation Facility | Payer: Medicare Other | Source: Ambulatory Visit | Attending: Orthopedic Surgery | Admitting: Orthopedic Surgery

## 2012-11-25 ENCOUNTER — Encounter (HOSPITAL_COMMUNITY): Payer: Self-pay | Admitting: Anesthesiology

## 2012-11-25 ENCOUNTER — Inpatient Hospital Stay (HOSPITAL_COMMUNITY): Payer: Medicare Other | Admitting: Anesthesiology

## 2012-11-25 ENCOUNTER — Encounter (HOSPITAL_COMMUNITY)
Admission: RE | Disposition: A | Discharge status: Rehabilitation Facility | Payer: Self-pay | Source: Ambulatory Visit | Attending: Orthopedic Surgery

## 2012-11-25 DIAGNOSIS — M629 Disorder of muscle, unspecified: Secondary | ICD-10-CM | POA: Diagnosis present

## 2012-11-25 DIAGNOSIS — K219 Gastro-esophageal reflux disease without esophagitis: Secondary | ICD-10-CM | POA: Diagnosis present

## 2012-11-25 DIAGNOSIS — Z7901 Long term (current) use of anticoagulants: Secondary | ICD-10-CM

## 2012-11-25 DIAGNOSIS — I872 Venous insufficiency (chronic) (peripheral): Secondary | ICD-10-CM | POA: Diagnosis present

## 2012-11-25 DIAGNOSIS — I509 Heart failure, unspecified: Secondary | ICD-10-CM | POA: Diagnosis present

## 2012-11-25 DIAGNOSIS — G4730 Sleep apnea, unspecified: Secondary | ICD-10-CM | POA: Diagnosis present

## 2012-11-25 DIAGNOSIS — E1142 Type 2 diabetes mellitus with diabetic polyneuropathy: Secondary | ICD-10-CM | POA: Diagnosis present

## 2012-11-25 DIAGNOSIS — L03116 Cellulitis of left lower limb: Secondary | ICD-10-CM

## 2012-11-25 DIAGNOSIS — I739 Peripheral vascular disease, unspecified: Secondary | ICD-10-CM | POA: Diagnosis present

## 2012-11-25 DIAGNOSIS — J4489 Other specified chronic obstructive pulmonary disease: Secondary | ICD-10-CM | POA: Diagnosis present

## 2012-11-25 DIAGNOSIS — M2420 Disorder of ligament, unspecified site: Secondary | ICD-10-CM | POA: Diagnosis present

## 2012-11-25 DIAGNOSIS — Z8614 Personal history of Methicillin resistant Staphylococcus aureus infection: Secondary | ICD-10-CM

## 2012-11-25 DIAGNOSIS — L02419 Cutaneous abscess of limb, unspecified: Secondary | ICD-10-CM | POA: Diagnosis present

## 2012-11-25 DIAGNOSIS — Z9981 Dependence on supplemental oxygen: Secondary | ICD-10-CM

## 2012-11-25 DIAGNOSIS — Z87891 Personal history of nicotine dependence: Secondary | ICD-10-CM

## 2012-11-25 DIAGNOSIS — L97809 Non-pressure chronic ulcer of other part of unspecified lower leg with unspecified severity: Principal | ICD-10-CM | POA: Diagnosis present

## 2012-11-25 DIAGNOSIS — E1149 Type 2 diabetes mellitus with other diabetic neurological complication: Secondary | ICD-10-CM | POA: Diagnosis present

## 2012-11-25 DIAGNOSIS — L03119 Cellulitis of unspecified part of limb: Secondary | ICD-10-CM | POA: Diagnosis present

## 2012-11-25 DIAGNOSIS — Z85828 Personal history of other malignant neoplasm of skin: Secondary | ICD-10-CM

## 2012-11-25 DIAGNOSIS — Z79899 Other long term (current) drug therapy: Secondary | ICD-10-CM

## 2012-11-25 DIAGNOSIS — D62 Acute posthemorrhagic anemia: Secondary | ICD-10-CM | POA: Diagnosis not present

## 2012-11-25 DIAGNOSIS — J449 Chronic obstructive pulmonary disease, unspecified: Secondary | ICD-10-CM | POA: Diagnosis present

## 2012-11-25 DIAGNOSIS — Z86718 Personal history of other venous thrombosis and embolism: Secondary | ICD-10-CM

## 2012-11-25 HISTORY — PX: AMPUTATION: SHX166

## 2012-11-25 LAB — COMPREHENSIVE METABOLIC PANEL
AST: 16 U/L (ref 0–37)
Albumin: 3 g/dL — ABNORMAL LOW (ref 3.5–5.2)
BUN: 21 mg/dL (ref 6–23)
Calcium: 8.8 mg/dL (ref 8.4–10.5)
Chloride: 103 mEq/L (ref 96–112)
Creatinine, Ser: 1.09 mg/dL (ref 0.50–1.35)
GFR calc non Af Amer: 71 mL/min — ABNORMAL LOW (ref >90)
Total Bilirubin: 0.2 mg/dL — ABNORMAL LOW (ref 0.3–1.2)

## 2012-11-25 LAB — CBC
HCT: 36 % — ABNORMAL LOW (ref 39.0–52.0)
MCH: 32.1 pg (ref 26.0–34.0)
MCV: 94.7 fL (ref 78.0–100.0)
Platelets: 211 10*3/uL (ref 150–400)
RDW: 13.4 % (ref 11.5–15.5)

## 2012-11-25 LAB — GLUCOSE, CAPILLARY: Glucose-Capillary: 118 mg/dL — ABNORMAL HIGH (ref 70–99)

## 2012-11-25 LAB — APTT: aPTT: 43 seconds — ABNORMAL HIGH (ref 24–37)

## 2012-11-25 LAB — PREPARE RBC (CROSSMATCH)

## 2012-11-25 LAB — PROTIME-INR
INR: 1.91 — ABNORMAL HIGH (ref 0.00–1.49)
Prothrombin Time: 21.3 seconds — ABNORMAL HIGH (ref 11.6–15.2)

## 2012-11-25 SURGERY — AMPUTATION BELOW KNEE
Anesthesia: General | Site: Leg Lower | Laterality: Left | Wound class: Dirty or Infected

## 2012-11-25 MED ORDER — OXYCODONE HCL 5 MG PO TABS
5.0000 mg | ORAL_TABLET | Freq: Every day | ORAL | Status: DC | PRN
  Administered 2012-11-25 – 2012-11-27 (×9): 5 mg via ORAL
  Filled 2012-11-25 (×9): qty 1

## 2012-11-25 MED ORDER — LACTATED RINGERS IV SOLN
INTRAVENOUS | Status: DC | PRN
  Administered 2012-11-25 (×2): via INTRAVENOUS

## 2012-11-25 MED ORDER — ONDANSETRON HCL 4 MG/2ML IJ SOLN
INTRAMUSCULAR | Status: DC | PRN
  Administered 2012-11-25: 4 mg via INTRAVENOUS

## 2012-11-25 MED ORDER — WARFARIN SODIUM 7.5 MG PO TABS
7.5000 mg | ORAL_TABLET | Freq: Every day | ORAL | Status: DC
  Filled 2012-11-25: qty 1

## 2012-11-25 MED ORDER — OXYCODONE HCL 5 MG PO TABS
ORAL_TABLET | ORAL | Status: AC
  Administered 2012-11-25: 5 mg via ORAL
  Filled 2012-11-25: qty 1

## 2012-11-25 MED ORDER — LIDOCAINE HCL (CARDIAC) 20 MG/ML IV SOLN
INTRAVENOUS | Status: DC | PRN
  Administered 2012-11-25: 40 mg via INTRAVENOUS

## 2012-11-25 MED ORDER — HYDROMORPHONE HCL PF 1 MG/ML IJ SOLN
0.2500 mg | INTRAMUSCULAR | Status: DC | PRN
  Administered 2012-11-25 (×2): 0.5 mg via INTRAVENOUS

## 2012-11-25 MED ORDER — ONDANSETRON HCL 4 MG PO TABS: 4.0000 mg | ORAL_TABLET | Freq: Four times a day (QID) | ORAL | Status: DC | PRN

## 2012-11-25 MED ORDER — HYDROMORPHONE HCL PF 1 MG/ML IJ SOLN
1.0000 mg | INTRAMUSCULAR | Status: DC | PRN
  Administered 2012-11-25 – 2012-11-27 (×17): 1 mg via INTRAVENOUS
  Filled 2012-11-25 (×17): qty 1

## 2012-11-25 MED ORDER — OXYCODONE HCL ER 15 MG PO T12A
15.0000 mg | EXTENDED_RELEASE_TABLET | Freq: Two times a day (BID) | ORAL | Status: DC
  Administered 2012-11-25 – 2012-11-27 (×5): 15 mg via ORAL
  Filled 2012-11-25 (×5): qty 1

## 2012-11-25 MED ORDER — SODIUM CHLORIDE 0.9 % IV SOLN
INTRAVENOUS | Status: DC
  Administered 2012-11-25: 18:00:00 via INTRAVENOUS
  Administered 2012-11-27: 20 mL/h via INTRAVENOUS

## 2012-11-25 MED ORDER — PHENYLEPHRINE HCL 10 MG/ML IJ SOLN
10.0000 mg | INTRAVENOUS | Status: DC | PRN
  Administered 2012-11-25: 30 ug/min via INTRAVENOUS

## 2012-11-25 MED ORDER — FUROSEMIDE 40 MG PO TABS
40.0000 mg | ORAL_TABLET | Freq: Every day | ORAL | Status: DC
  Administered 2012-11-25 – 2012-11-27 (×3): 40 mg via ORAL
  Filled 2012-11-25 (×4): qty 1

## 2012-11-25 MED ORDER — OXYCODONE HCL 5 MG PO TABS: 5.0000 mg | ORAL_TABLET | Freq: Once | ORAL | Status: AC | PRN

## 2012-11-25 MED ORDER — ALBUMIN HUMAN 5 % IV SOLN
INTRAVENOUS | Status: DC | PRN
  Administered 2012-11-25 (×2): via INTRAVENOUS

## 2012-11-25 MED ORDER — WARFARIN - PHARMACIST DOSING INPATIENT
Freq: Every day | Status: DC
  Administered 2012-11-26: 18:00:00

## 2012-11-25 MED ORDER — ARTIFICIAL TEARS OP OINT
TOPICAL_OINTMENT | OPHTHALMIC | Status: DC | PRN
  Administered 2012-11-25: 1 via OPHTHALMIC

## 2012-11-25 MED ORDER — METOCLOPRAMIDE HCL 10 MG PO TABS: 5.0000 mg | ORAL_TABLET | Freq: Three times a day (TID) | ORAL | Status: DC | PRN

## 2012-11-25 MED ORDER — ONDANSETRON HCL 4 MG/2ML IJ SOLN: 4.0000 mg | Freq: Four times a day (QID) | INTRAMUSCULAR | Status: DC | PRN

## 2012-11-25 MED ORDER — FENTANYL 25 MCG/HR TD PT72
25.0000 ug | MEDICATED_PATCH | TRANSDERMAL | Status: DC
  Administered 2012-11-25: 25 ug via TRANSDERMAL
  Filled 2012-11-25: qty 1

## 2012-11-25 MED ORDER — MONTELUKAST SODIUM 10 MG PO TABS
10.0000 mg | ORAL_TABLET | Freq: Every day | ORAL | Status: DC
  Administered 2012-11-25 – 2012-11-26 (×2): 10 mg via ORAL
  Filled 2012-11-25 (×3): qty 1

## 2012-11-25 MED ORDER — HYDROMORPHONE HCL PF 1 MG/ML IJ SOLN
INTRAMUSCULAR | Status: AC
  Administered 2012-11-25: 0.5 mg via INTRAVENOUS
  Filled 2012-11-25: qty 1

## 2012-11-25 MED ORDER — FENTANYL CITRATE 0.05 MG/ML IJ SOLN
INTRAMUSCULAR | Status: DC | PRN
  Administered 2012-11-25: 100 ug via INTRAVENOUS
  Administered 2012-11-25 (×2): 50 ug via INTRAVENOUS
  Administered 2012-11-25: 100 ug via INTRAVENOUS
  Administered 2012-11-25: 50 ug via INTRAVENOUS

## 2012-11-25 MED ORDER — VANCOMYCIN HCL IN DEXTROSE 1-5 GM/200ML-% IV SOLN
1000.0000 mg | Freq: Two times a day (BID) | INTRAVENOUS | Status: AC
  Administered 2012-11-25: 1000 mg via INTRAVENOUS
  Filled 2012-11-25 (×3): qty 200

## 2012-11-25 MED ORDER — MOMETASONE FURO-FORMOTEROL FUM 100-5 MCG/ACT IN AERO
2.0000 | INHALATION_SPRAY | Freq: Two times a day (BID) | RESPIRATORY_TRACT | Status: DC
  Administered 2012-11-25 – 2012-11-27 (×4): 2 via RESPIRATORY_TRACT
  Filled 2012-11-25: qty 8.8

## 2012-11-25 MED ORDER — TOPIRAMATE 100 MG PO TABS
100.0000 mg | ORAL_TABLET | Freq: Two times a day (BID) | ORAL | Status: DC
  Administered 2012-11-25 – 2012-11-27 (×4): 100 mg via ORAL
  Filled 2012-11-25 (×5): qty 1

## 2012-11-25 MED ORDER — MIDAZOLAM HCL 5 MG/5ML IJ SOLN
INTRAMUSCULAR | Status: DC | PRN
  Administered 2012-11-25 (×2): 1 mg via INTRAVENOUS

## 2012-11-25 MED ORDER — FERROUS SULFATE 325 (65 FE) MG PO TABS
325.0000 mg | ORAL_TABLET | Freq: Three times a day (TID) | ORAL | Status: DC
  Administered 2012-11-25 – 2012-11-27 (×6): 325 mg via ORAL
  Filled 2012-11-25 (×9): qty 1

## 2012-11-25 MED ORDER — ONDANSETRON HCL 4 MG/2ML IJ SOLN: 4.0000 mg | Freq: Once | INTRAMUSCULAR | Status: DC | PRN

## 2012-11-25 MED ORDER — ALBUTEROL SULFATE (5 MG/ML) 0.5% IN NEBU: 2.5000 mg | INHALATION_SOLUTION | RESPIRATORY_TRACT | Status: DC | PRN

## 2012-11-25 MED ORDER — LACTATED RINGERS IV SOLN
INTRAVENOUS | Status: DC
  Administered 2012-11-25: 08:00:00 via INTRAVENOUS

## 2012-11-25 MED ORDER — SUCCINYLCHOLINE CHLORIDE 20 MG/ML IJ SOLN
INTRAMUSCULAR | Status: DC | PRN
  Administered 2012-11-25: 100 mg via INTRAVENOUS

## 2012-11-25 MED ORDER — POTASSIUM CHLORIDE CRYS ER 20 MEQ PO TBCR
20.0000 meq | EXTENDED_RELEASE_TABLET | Freq: Every day | ORAL | Status: DC
  Administered 2012-11-25 – 2012-11-27 (×3): 20 meq via ORAL
  Filled 2012-11-25 (×4): qty 1

## 2012-11-25 MED ORDER — METOCLOPRAMIDE HCL 5 MG/ML IJ SOLN
5.0000 mg | Freq: Three times a day (TID) | INTRAMUSCULAR | Status: DC | PRN
  Administered 2012-11-26: 10 mg via INTRAVENOUS

## 2012-11-25 MED ORDER — PROPOFOL 10 MG/ML IV BOLUS
INTRAVENOUS | Status: DC | PRN
  Administered 2012-11-25: 150 mg via INTRAVENOUS

## 2012-11-25 MED ORDER — DULOXETINE HCL 30 MG PO CPEP
30.0000 mg | ORAL_CAPSULE | Freq: Every day | ORAL | Status: DC
  Administered 2012-11-25 – 2012-11-27 (×3): 30 mg via ORAL
  Filled 2012-11-25 (×4): qty 1

## 2012-11-25 MED ORDER — SIMVASTATIN 5 MG PO TABS
5.0000 mg | ORAL_TABLET | Freq: Every day | ORAL | Status: DC
  Administered 2012-11-25 – 2012-11-26 (×2): 5 mg via ORAL
  Filled 2012-11-25 (×4): qty 1

## 2012-11-25 MED ORDER — PHENYLEPHRINE HCL 10 MG/ML IJ SOLN
INTRAMUSCULAR | Status: DC | PRN
  Administered 2012-11-25: 160 ug via INTRAVENOUS
  Administered 2012-11-25: 120 ug via INTRAVENOUS

## 2012-11-25 MED ORDER — WARFARIN SODIUM 7.5 MG PO TABS
7.5000 mg | ORAL_TABLET | Freq: Every day | ORAL | Status: DC
  Administered 2012-11-25 – 2012-11-26 (×2): 7.5 mg via ORAL
  Filled 2012-11-25 (×3): qty 1

## 2012-11-25 MED ORDER — VANCOMYCIN HCL IN DEXTROSE 1-5 GM/200ML-% IV SOLN: 1000.0000 mg | Freq: Two times a day (BID) | INTRAVENOUS | Status: DC

## 2012-11-25 MED ORDER — OXYCODONE HCL 5 MG/5ML PO SOLN: 5.0000 mg | Freq: Once | ORAL | Status: AC | PRN

## 2012-11-25 MED ORDER — 0.9 % SODIUM CHLORIDE (POUR BTL) OPTIME
TOPICAL | Status: DC | PRN
  Administered 2012-11-25: 1000 mL

## 2012-11-25 MED ORDER — WARFARIN - PHYSICIAN DOSING INPATIENT: Freq: Every day | Status: DC

## 2012-11-25 MED ORDER — LEVALBUTEROL TARTRATE 45 MCG/ACT IN AERO
2.0000 | INHALATION_SPRAY | Freq: Every day | RESPIRATORY_TRACT | Status: DC | PRN
  Filled 2012-11-25: qty 15

## 2012-11-25 MED ORDER — CEFAZOLIN SODIUM-DEXTROSE 2-3 GM-% IV SOLR
2.0000 g | INTRAVENOUS | Status: AC
  Administered 2012-11-25: 2 g via INTRAVENOUS
  Filled 2012-11-25: qty 50

## 2012-11-25 SURGICAL SUPPLY — 48 items
BANDAGE ESMARK 6X9 LF (GAUZE/BANDAGES/DRESSINGS) ×1 IMPLANT
BANDAGE GAUZE ELAST BULKY 4 IN (GAUZE/BANDAGES/DRESSINGS) ×2 IMPLANT
BLADE SAW RECIP 87.9 MT (BLADE) ×4 IMPLANT
BLADE SURG 21 STRL SS (BLADE) ×2 IMPLANT
BNDG COHESIVE 6X5 TAN STRL LF (GAUZE/BANDAGES/DRESSINGS) ×6 IMPLANT
BNDG ESMARK 6X9 LF (GAUZE/BANDAGES/DRESSINGS) ×2
CLOTH BEACON ORANGE TIMEOUT ST (SAFETY) ×2 IMPLANT
COVER SURGICAL LIGHT HANDLE (MISCELLANEOUS) ×2 IMPLANT
CUFF TOURNIQUET SINGLE 34IN LL (TOURNIQUET CUFF) ×2 IMPLANT
CUFF TOURNIQUET SINGLE 44IN (TOURNIQUET CUFF) IMPLANT
DRAIN PENROSE 1/2X12 LTX STRL (WOUND CARE) IMPLANT
DRAPE EXTREMITY T 121X128X90 (DRAPE) ×2 IMPLANT
DRAPE PROXIMA HALF (DRAPES) ×4 IMPLANT
DRAPE U-SHAPE 47X51 STRL (DRAPES) ×2 IMPLANT
DRSG ADAPTIC 3X8 NADH LF (GAUZE/BANDAGES/DRESSINGS) ×2 IMPLANT
DRSG PAD ABDOMINAL 8X10 ST (GAUZE/BANDAGES/DRESSINGS) ×6 IMPLANT
DURAPREP 26ML APPLICATOR (WOUND CARE) ×2 IMPLANT
ELECT REM PT RETURN 9FT ADLT (ELECTROSURGICAL) ×2
ELECTRODE REM PT RTRN 9FT ADLT (ELECTROSURGICAL) ×1 IMPLANT
EVACUATOR 1/8 PVC DRAIN (DRAIN) IMPLANT
GLOVE BIOGEL PI IND STRL 8 (GLOVE) ×1 IMPLANT
GLOVE BIOGEL PI IND STRL 9 (GLOVE) ×1 IMPLANT
GLOVE BIOGEL PI INDICATOR 8 (GLOVE) ×1
GLOVE BIOGEL PI INDICATOR 9 (GLOVE) ×1
GLOVE SURG ORTHO 9.0 STRL STRW (GLOVE) ×2 IMPLANT
GLOVE SURG SS PI 8.0 STRL IVOR (GLOVE) ×2 IMPLANT
GOWN PREVENTION PLUS XLARGE (GOWN DISPOSABLE) ×2 IMPLANT
GOWN SRG XL XLNG 56XLVL 4 (GOWN DISPOSABLE) ×1 IMPLANT
GOWN STRL NON-REIN XL XLG LVL4 (GOWN DISPOSABLE) ×1
KIT BASIN OR (CUSTOM PROCEDURE TRAY) ×2 IMPLANT
KIT ROOM TURNOVER OR (KITS) ×2 IMPLANT
MANIFOLD NEPTUNE II (INSTRUMENTS) ×2 IMPLANT
NS IRRIG 1000ML POUR BTL (IV SOLUTION) ×2 IMPLANT
PACK GENERAL/GYN (CUSTOM PROCEDURE TRAY) ×2 IMPLANT
PAD ARMBOARD 7.5X6 YLW CONV (MISCELLANEOUS) ×4 IMPLANT
SPONGE GAUZE 4X4 12PLY (GAUZE/BANDAGES/DRESSINGS) ×2 IMPLANT
SPONGE LAP 18X18 X RAY DECT (DISPOSABLE) IMPLANT
STAPLER VISISTAT 35W (STAPLE) ×2 IMPLANT
STOCKINETTE IMPERVIOUS LG (DRAPES) ×2 IMPLANT
SUT ETHILON 2 0 PSLX (SUTURE) ×12 IMPLANT
SUT PDS AB 1 CT  36 (SUTURE) ×2
SUT PDS AB 1 CT 36 (SUTURE) ×2 IMPLANT
SUT SILK 2 0 (SUTURE) ×1
SUT SILK 2-0 18XBRD TIE 12 (SUTURE) ×1 IMPLANT
TOWEL OR 17X24 6PK STRL BLUE (TOWEL DISPOSABLE) ×2 IMPLANT
TOWEL OR 17X26 10 PK STRL BLUE (TOWEL DISPOSABLE) ×2 IMPLANT
TUBE ANAEROBIC SPECIMEN COL (MISCELLANEOUS) IMPLANT
WATER STERILE IRR 1000ML POUR (IV SOLUTION) ×2 IMPLANT

## 2012-11-25 NOTE — Anesthesia Postprocedure Evaluation (Signed)
  Anesthesia Post-op Note  Patient: Jeremy [name]  Procedure(s) Performed: Procedure(s) with comments: AMPUTATION BELOW KNEE (Left) - Left Below Knee Amputation  Patient Location: PACU  Anesthesia Type:General  Level of Consciousness: awake, alert  and oriented  Airway and Oxygen Therapy: Patient Spontanous Breathing and Patient connected to nasal cannula oxygen  Post-op Pain: mild  Post-op Assessment: Post-op Vital signs reviewed, Patient's Cardiovascular Status Stable, Patent Airway, Adequate PO intake and Pain level controlled  Post-op Vital Signs: stable  Complications: No apparent anesthesia complications

## 2012-11-25 NOTE — Anesthesia Preprocedure Evaluation (Addendum)
Anesthesia Evaluation  Patient identified by MRN, date of birth, ID band Patient awake    Reviewed: Allergy & Precautions, H&P , NPO status , Patient's Chart, lab work & pertinent test results  Airway Mallampati: II TM Distance: >3 FB Neck ROM: Full    Dental  (+) Edentulous Upper and Edentulous Lower   Pulmonary shortness of breath and Long-Term Oxygen Therapy, asthma , sleep apnea, Continuous Positive Airway Pressure Ventilation and Oxygen sleep apnea , COPD COPD inhaler and oxygen dependent,    + decreased breath sounds      Cardiovascular + Peripheral Vascular Disease and DVT Rhythm:Regular Rate:Normal     Neuro/Psych  Headaches,  Neuromuscular disease    GI/Hepatic GERD-  ,  Endo/Other  diabetes  Renal/GU      Musculoskeletal   Abdominal   Peds  Hematology   Anesthesia Other Findings   Reproductive/Obstetrics                          Anesthesia Physical Anesthesia Plan  ASA: III  Anesthesia Plan: General   Post-op Pain Management:    Induction: Intravenous  Airway Management Planned: LMA  Additional Equipment:   Intra-op Plan:   Post-operative Plan: Extubation in OR  Informed Consent: I have reviewed the patients History and Physical, chart, labs and discussed the procedure including the risks, benefits and alternatives for the proposed anesthesia with the patient or authorized representative who has indicated his/her understanding and acceptance.     Plan Discussed with: CRNA and Anesthesiologist  Anesthesia Plan Comments: (Non-healing LLE ulcer Longstanding Type 2 Glucose 128 H/O DVT/venous insufficiency on coumadin INR 1.91 COPD on home O2  Kipp Brood, MD )        Anesthesia Quick Evaluation

## 2012-11-25 NOTE — Progress Notes (Signed)
ANTICOAGULATION CONSULT NOTE - Initial Consult  Pharmacy Consult for Coumadin Indication: PVD, Hx DVTs 1980s  No Known Allergies  Patient Measurements: Height: 5\' 10"  (177.8 cm) Weight: 270 lb (122.471 kg) IBW/kg (Calculated) : 73   Vital Signs: Temp: 98.2 F (36.8 C) (08/06 1340) Temp src: Oral (08/06 1340) BP: 136/61 mmHg (08/06 1340) Pulse Rate: 63 (08/06 1340)  Labs:  Recent Labs  11/25/12 0751 11/25/12 0900  HGB 12.2*  --   HCT 36.0*  --   PLT 211  --   APTT  --  43*  LABPROT  --  21.3*  INR  --  1.91*  CREATININE 1.09  --     Estimated Creatinine Clearance: 93.4 ml/min (by C-G formula based on Cr of 1.09).   Medical History: Past Medical History  Diagnosis Date  . GERD (gastroesophageal reflux disease)   . Asthma   . Shortness of breath   . Leg pain   . Peripheral vascular disease   . Sleep apnea     uses bipap, sleep study done in [place] 2 years ago, dr Cala Bradford byrd  . Anemia   . Headache(784.0)     sinus headaches  . COPD (chronic obstructive pulmonary disease)     3L continuous  . Peripheral neuropathy   . Cancer     hx basal cell skin cancer  . Psoriasis   . 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 CHF (congestive heart failure)     does not see a cardiologist  . Diabetes mellitus     not on medication     Assessment: 62yom on Coumadin PTA 7.5mg  daily for PVD and Hx DVT 1980s - last dose taken 8/5 with INR 1.91. 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  He presents today for BKA.    Goal of Therapy:  INR 2-3 Monitor platelets by anticoagulation protocol: Yes   Plan:  Coumadin 7.5mg  daily Daily INR  Leota Sauers Pharm.D. CPP, BCPS Clinical Pharmacist [phone] 11/25/2012 2:12 PM

## 2012-11-25 NOTE — Progress Notes (Signed)
UR COMPLETED  

## 2012-11-25 NOTE — H&P (Signed)
Jeremy [name] is an 62 y.o. male.   Chief Complaint: Chronic ulceration left lower extremity HPI: Patient is a 62 year old gentleman with diabetes peripheral vascular disease venous stasis insufficiency who has had ulceration to the left lower extremity for over 30 years. Patient underwent aggressive wound therapy at the wound center and was then referred to my office for treatment. Patient underwent prolonged therapy including surgery compressive wraps for over several years and patient still has ulceration. Despite the ulcers being improved patient's pain level is to the point where he can no longer function and wishes to proceed with a transtibial amputation.  Past Medical History  Diagnosis Date  . Diabetes mellitus   . GERD (gastroesophageal reflux disease)   . Asthma   . Shortness of breath   . Leg pain   . Peripheral vascular disease   . Sleep apnea     uses bipap, sleep study done in [place] 2 years ago, dr Cala Bradford byrd  . Anemia   . Headache(784.0)     sinus headaches  . COPD (chronic obstructive pulmonary disease)     3L continuous  . Peripheral neuropathy   . Cancer     hx basal cell skin cancer  . Psoriasis   . 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 CHF (congestive heart failure)     does not see a cardiologist    Past Surgical History  Procedure Laterality Date  . Arm surgery    . Vein ligation and stripping    . Tummy tuck    . Stomach stapling    . Skin grafts    . I&d extremity  03/13/2011    Procedure: IRRIGATION AND DEBRIDEMENT EXTREMITY;  Surgeon: Nadara Mustard, MD;  Location: [facility] OR;  Service: Orthopedics;  Laterality: Left;  . Skin split graft  03/13/2012    Procedure: SKIN GRAFT SPLIT THICKNESS;  Surgeon: Nadara Mustard, MD;  Location: [facility] OR;  Service: Orthopedics;  Laterality: Left;  Excisional Debridement Left Leg, Split Thickness Skin Graft, Wound VAC  . Back stimulator    . Apligraft placement Left 07/31/2012    Procedure: Apply Skin Graft;   Surgeon: Nadara Mustard, MD;  Location: [facility] OR;  Service: Orthopedics;  Laterality: Left;  . Application of wound vac Left 07/31/2012    Procedure: APPLICATION OF WOUND VAC;  Surgeon: Nadara Mustard, MD;  Location: [facility] OR;  Service: Orthopedics;  Laterality: Left;  . Cardiac catheterization      Hx: of " around 1985 at [facility]"  . Cyst excision      Hx: of  . I&d extremity Left 11/06/2012    Procedure: IRRIGATION AND DEBRIDEMENT EXTREMITY-Left lower ;  Surgeon: Nadara Mustard, MD;  Location: [facility] OR;  Service: Orthopedics;  Laterality: Left;  Left Leg Irrigation and Debridement, Place Skin Graft, Wound VAC, [place]    Family History  Problem Relation Age of Onset  . Heart disease Brother    Social History:  reports that he quit smoking about 21 years ago. His smoking use included Cigarettes. He has a 3 pack-year smoking history. He has never used smokeless tobacco. He reports that  drinks alcohol. He reports that he does not use illicit drugs.  Allergies: No Known Allergies  No prescriptions prior to admission    No results found for this or any previous visit (from the past 48 hour(s)). No results found.  Review of Systems  All other systems reviewed and are  negative.    Height 5\' 10"  (1.778 m), weight 122.471 kg (270 lb). Physical Exam  On examination patient has chronic ulceration to the left lower extremity. There is a good granulating base there is drainage. Patient has had recurrent infections and is currently under antibiotic treatment for the most recent infection. Assessment/Plan Assessment: Chronic ulceration left lower extremity.  Plan: Due to failure of years of conservative therapy patient was to proceed with transtibial amputation. Risks and benefits were discussed including infection neurovascular injury nonhealing of the wound potential for revision amputation potential for persistent pain. Patient states he understands and wished to proceed with surgical intervention at this  time.  Jeremy [name] 11/25/2012, 6:32 AM

## 2012-11-25 NOTE — Transfer of Care (Signed)
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

## 2012-11-25 NOTE — Progress Notes (Signed)
[facility]  Patient Status: [facility]  Patient Status:   Pt is active with [facility] up to this admission  [facility] is providing the following services:   [facility] for IV Antibiotics.  [facility] Coordinator will follow this readmission to support transition to home if needed.  [facility] has been working with [facility] in [place].  If patient discharges after hours, please call [phone].   Sedalia Muta 11/25/2012, 2:29 PM   11/25/2012, 2:28 PM

## 2012-11-25 NOTE — Anesthesia Procedure Notes (Signed)
Procedure Name: Intubation Date/Time: 11/25/2012 10:33 AM Performed by: Gayla Medicus Pre-anesthesia Checklist: Patient identified, Timeout performed, Emergency Drugs available, Suction available and Patient being monitored Patient Re-evaluated:Patient Re-evaluated prior to inductionOxygen Delivery Method: Circle system utilized Preoxygenation: Pre-oxygenation with 100% oxygen Intubation Type: IV induction LMA: LMA inserted LMA Size: 4.0 Laryngoscope Size: Mac and 4 Grade View: Grade II Tube type: Oral Tube size: 7.5 mm Number of attempts: 1 Airway Equipment and Method: Stylet Placement Confirmation: ETT inserted through vocal cords under direct vision,  positive ETCO2 and breath sounds checked- equal and bilateral Secured at: 22 cm Tube secured with: Tape Dental Injury: Teeth and Oropharynx as per pre-operative assessment  Comments: Attempt with LMA #4, placed with ease but unable to get a good seal with LMA. Change to GETA. Both attempts atraumatic, VSS. KH

## 2012-11-25 NOTE — Op Note (Signed)
OPERATIVE REPORT  DATE OF SURGERY: 11/25/2012  PATIENT:  Jeremy [name],  62 y.o. male  PRE-OPERATIVE DIAGNOSIS:  Chronic Ulcer Left Leg  POST-OPERATIVE DIAGNOSIS:  Chronic Ulcer Left Leg  PROCEDURE:  Procedure(s): AMPUTATION BELOW KNEE  SURGEON:  Surgeon(s): Nadara Mustard, MD  ANESTHESIA:   general  EBL:  See anesthesia is notes ML  SPECIMEN:  Source of Specimen:  Left leg  TOURNIQUET:   Total Tourniquet Time Documented: Thigh (Left) - 10 minutes Total: Thigh (Left) - 10 minutes   PROCEDURE DETAILS: Patient is a 62 year old gentleman with over 30 year chronic history of a mass of ulceration to the left lower extremity. Patient over the last several years has undergone aggressive wound care tissue grafting to his despite improving wound bed he still has a massive wound and has increasing pain which is not controllable with either topical pain patches or with oral antibiotics oral pain medication. Patient is also had recurrent massive infections requiring hospitalization IV antibiotics. Do to the massive recurrent infections uncontrollable pain and a chronic ulceration over 7 years of aggressive treatment patient was to proceed with an transtibial amputation. Risks and benefits were discussed with the largest wrist pain nonhealing of the wound. Patient states he understands was pursued this time. Description of procedure patient brought to the operating room and underwent a general anesthetic. After adequate levels of anesthesia were obtained patient's left lower extremity was prepped using DuraPrep and draped into a sterile field. The foot was draped out of sterile field with an impervious stocking at. A transverse incision was made 11 cm distal the tibial tubercle this curved proximally and a large posterior flap was created. Do to massive calcification of the fascial layer even a 21 blade knife was extremely difficult to cut through the fascia. After cutting to the fascia there was a large  calcified fascial deposits which almost appeared to be separate bone. The tibia was transected 1 cm proximal to the skin incision the fibula was transected just proximal to the tibial incision. The vascular bundles were suture ligated with 2-0 silk. The tourniquet was deflated hemostasis was obtained. Patient did have a significant amount of oozing from the soft tissues secondary to his Coumadin and transfusion was requested neuropathy anticipate this will be provided in the recovery room. The incision was closed using 2-0 nylon. The calcified fascial layer was unable to be sutured needle would not go through it. The wound closed well the wound was covered Adaptic orthopedic sponges AB dressing Kerlix and Coban. Patient was extubated taken to the PACU in stable condition.  PLAN OF CARE: Admit to inpatient   PATIENT DISPOSITION:  PACU - hemodynamically stable.   Nadara Mustard, MD 11/25/2012 11:29 AM

## 2012-11-25 NOTE — Preoperative (Signed)
Beta Blockers   Reason not to administer Beta Blockers:Not Applicable 

## 2012-11-26 ENCOUNTER — Encounter (HOSPITAL_COMMUNITY): Payer: Self-pay | Admitting: Orthopedic Surgery

## 2012-11-26 DIAGNOSIS — L98499 Non-pressure chronic ulcer of skin of other sites with unspecified severity: Secondary | ICD-10-CM

## 2012-11-26 DIAGNOSIS — S88119A Complete traumatic amputation at level between knee and ankle, unspecified lower leg, initial encounter: Secondary | ICD-10-CM

## 2012-11-26 DIAGNOSIS — I739 Peripheral vascular disease, unspecified: Secondary | ICD-10-CM

## 2012-11-26 LAB — CBC WITH DIFFERENTIAL/PLATELET
Basophils Absolute: 0.1 10*3/uL (ref 0.0–0.1)
Basophils Relative: 1 % (ref 0–1)
Eosinophils Absolute: 0.3 10*3/uL (ref 0.0–0.7)
MCH: 31.7 pg (ref 26.0–34.0)
MCHC: 33.9 g/dL (ref 30.0–36.0)
Neutro Abs: 7.6 10*3/uL (ref 1.7–7.7)
Neutrophils Relative %: 72 % (ref 43–77)
Platelets: 149 10*3/uL — ABNORMAL LOW (ref 150–400)
RDW: 14.2 % (ref 11.5–15.5)

## 2012-11-26 LAB — PROTIME-INR
INR: 2.17 — ABNORMAL HIGH (ref 0.00–1.49)
Prothrombin Time: 23.5 seconds — ABNORMAL HIGH (ref 11.6–15.2)

## 2012-11-26 LAB — TYPE AND SCREEN: Unit division: 0

## 2012-11-26 NOTE — Progress Notes (Signed)
Rehab Admissions Coordinator Note:  Patient was screened by Brock Ra for appropriateness for an Inpatient Acute Rehab Consult.  At this time, we are recommending Inpatient Rehab consult.  Brock Ra 11/26/2012, 12:38 PM  I can be reached at [phone].

## 2012-11-26 NOTE — Evaluation (Signed)
Physical Therapy Evaluation Patient Details Name: Jeremy [name] MRN: [id number] DOB: [birth year] Today's Date: 11/26/2012 Time: 9562-1308 PT Time Calculation (min): 22 min  PT Assessment / Plan / Recommendation History of Present Illness  Pt. admitted with long term nonhealing ulcer L LE,having failed conservative management,  and underwent L transtibial amputation.  Clinical Impression  Pt. Presents to PT with a decrease in functional mobility and gait and will need acute PT to address these and below issues.  Pt. Appears to be good candidate for CIR for further inpatient rehab that he will need prior to DC home.      PT Assessment  Patient needs continued PT services    Follow Up Recommendations  CIR;Supervision/Assistance - 24 hour;Supervision for mobility/OOB    Does the patient have the potential to tolerate intense rehabilitation      Barriers to Discharge Decreased caregiver support      Equipment Recommendations  Rolling walker with 5" wheels    Recommendations for Other Services Rehab consult   Frequency Min 5X/week    Precautions / Restrictions Precautions Precautions: Fall Precaution Comments: chronic O2 Restrictions Weight Bearing Restrictions: Yes LLE Weight Bearing: Non weight bearing   Pertinent Vitals/Pain See vitals tab       Mobility  Bed Mobility Bed Mobility: Supine to Sit;Sitting - Scoot to Edge of Bed Supine to Sit: 5: Supervision;HOB flat;With rails Sitting - Scoot to Edge of Bed: 5: Supervision Details for Bed Mobility Assistance: vc's for safe technique and for line management Transfers Transfers: Sit to Stand;Stand to Sit Sit to Stand: 1: +2 Total assist;With upper extremity assist;From bed Sit to Stand: Patient Percentage: 70% Stand to Sit: 1: +2 Total assist;With armrests;To chair/3-in-1 Stand to Sit: Patient Percentage: 70% Details for Transfer Assistance: vc's for safety and correct hand placement Ambulation/Gait Ambulation/Gait  Assistance: 1: +2 Total assist Ambulation/Gait: Patient Percentage: 70% Ambulation Distance (Feet): 2 Feet Assistive device: Rolling walker Ambulation/Gait Assistance Details: Pt. only albe to tolerate 2 turning steps with RW and 2 assist.  Had difficulty with bearing weight on UEs through RW to take step with right foot.  Mostly shuffeled foot around to recliner. Gait Pattern: Step-to pattern    Exercises Amputee Exercises Quad Sets: AROM;Both;10 reps Hip Extension: AROM;Left;5 reps Hip ABduction/ADduction: AROM;Left;5 reps   PT Diagnosis: Difficulty walking;Acute pain  PT Problem List: Decreased strength;Decreased activity tolerance;Decreased mobility;Decreased balance;Decreased knowledge of use of DME;Decreased knowledge of precautions;Pain PT Treatment Interventions: DME instruction;Gait training;Functional mobility training;Therapeutic activities;Therapeutic exercise;Patient/family education     PT Goals(Current goals can be found in the care plan section) Acute Rehab PT Goals Patient Stated Goal: rehab then home PT Goal Formulation: With patient Time For Goal Achievement: 12/03/12 Potential to Achieve Goals: Good  Visit Information  Last PT Received On: 11/26/12 Assistance Needed: +2 History of Present Illness: Pt. admitted with long term nonhealing ulcer L LE,having failed conservative management,  and underwent L transtibial amputation.       Prior Functioning  Home Living Family/patient expects to be discharged to:: Private residence Living Arrangements: Spouse/significant other Available Help at Discharge: Family;Available PRN/intermittently Type of Home: House Home Access: Level entry Home Layout: One level Home Equipment: None Prior Function Level of Independence: Independent Comments: on 3LO2 at home Communication Communication: No difficulties Dominant Hand: Right    Cognition  Cognition Arousal/Alertness: Awake/alert Behavior During Therapy: WFL for  tasks assessed/performed Overall Cognitive Status: Within Functional Limits for tasks assessed    Extremity/Trunk Assessment Upper Extremity Assessment Upper Extremity Assessment:  Overall WFL for tasks assessed   Balance Balance Balance Assessed: Yes Static Sitting Balance Static Sitting - Balance Support: No upper extremity supported;Feet supported Static Sitting - Level of Assistance: 7: Independent Static Standing Balance Static Standing - Balance Support: Bilateral upper extremity supported;During functional activity Static Standing - Level of Assistance: 1: +2 Total assist  End of Session PT - End of Session Equipment Utilized During Treatment: Gait belt;Oxygen Activity Tolerance: Patient tolerated treatment well Patient left: in chair;with call bell/phone within reach;with family/visitor present Nurse Communication: Mobility status  GP     Ferman Hamming 11/26/2012, 12:22 PM Weldon Picking PT Acute Rehab Services [phone] [facility] [phone]

## 2012-11-26 NOTE — Progress Notes (Signed)
ANTICOAGULATION CONSULT NOTE - Follow Up Consult  Pharmacy Consult for Coumadin Indication: PVD, Hx DVTs  No Known Allergies  Patient Measurements: Height: 5\' 10"  (177.8 cm) Weight: 270 lb (122.471 kg) IBW/kg (Calculated) : 73 Heparin Dosing Weight:   Vital Signs: Temp: 98.2 F (36.8 C) (08/07 1300) BP: 128/51 mmHg (08/07 1300) Pulse Rate: 70 (08/07 1300)  Labs:  Recent Labs  11/25/12 0751 11/25/12 0900 11/26/12 0507  HGB 12.2*  --  10.3*  HCT 36.0*  --  30.4*  PLT 211  --  149*  APTT  --  43*  --   LABPROT  --  21.3* 23.5*  INR  --  1.91* 2.17*  CREATININE 1.09  --   --     Estimated Creatinine Clearance: 93.4 ml/min (by C-G formula based on Cr of 1.09).   Medications:  Scheduled:  . DULoxetine  30 mg Oral Daily  . fentaNYL  25 mcg Transdermal Q72H  . ferrous sulfate  325 mg Oral TID WC  . furosemide  40 mg Oral Daily  . mometasone-formoterol  2 puff Inhalation BID  . montelukast  10 mg Oral QHS  . OxyCODONE  15 mg Oral Q12H  . potassium chloride SA  20 mEq Oral Daily  . simvastatin  5 mg Oral q1800  . topiramate  100 mg Oral BID  . warfarin  7.5 mg Oral q1800  . Warfarin - Pharmacist Dosing Inpatient   Does not apply q1800    Assessment: 62yo male with PVD and Hx DVTs, on home dose of COumadin 7.5mg  daily.  INR therapeutic this AM at 2.17.  No bleeding problems noted s/p transtibial amputation yesterday.    Goal of Therapy:  INR 2-3 Monitor platelets by anticoagulation protocol: Yes   Plan:  1.  Continue Coumadin 7.5mg  daily 2.  Plan change to MWF INR if OK in AM  Marisue Humble, PharmD Clinical Pharmacist [facility]

## 2012-11-26 NOTE — Consult Note (Signed)
Physical Medicine and Rehabilitation Consult Reason for Consult: L-BKA Referring Physician: Dr. Lajoyce Corners   HPI: Jeremy [name] is a 62 y.o. male with history of DM-diet controlled, peripheral neuropathy, COPD-O2 dependent, left leg ulcer X 20 years with recent worsening. Patient with I and D abscess 07/18 as well as IV antibiotics for MRSA/pseudomonas infection but continued to have poor healing with substantial pain affecting QOL. 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 Lajoyce Corners.   Patient states he does not have much pain at the current time. He appears to be cognitively intact. Was up in a chair for several hours today  Review of Systems  HENT: Negative for hearing loss.   Eyes: Positive for blurred vision (right eye).  Respiratory: Positive for cough. Negative for shortness of breath.   Cardiovascular: Negative for chest pain and palpitations.  Gastrointestinal: Negative for heartburn, nausea and vomiting.  Genitourinary: Negative for urgency and frequency.  Neurological: Negative for dizziness and headaches.   Past Medical History  Diagnosis Date  . GERD (gastroesophageal reflux disease)   . Asthma   . Shortness of breath   . Leg pain   . Peripheral vascular disease   . Sleep apnea     uses bipap, sleep study done in [place] 2 years ago, dr Cala Bradford byrd  . Anemia   . Headache(784.0)     sinus headaches  . COPD (chronic obstructive pulmonary disease)     3L continuous  . Peripheral neuropathy   . Cancer     hx basal cell skin cancer  . Psoriasis   . 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 CHF (congestive heart failure)     does not see a cardiologist  . Diabetes mellitus     not on medication   Past Surgical History  Procedure Laterality Date  . Arm surgery    . Vein ligation and stripping    . Tummy tuck    . Stomach stapling    . Skin grafts    . I&d extremity  03/13/2011    Procedure: IRRIGATION AND DEBRIDEMENT EXTREMITY;  Surgeon: Nadara Mustard, MD;   Location: [facility] OR;  Service: Orthopedics;  Laterality: Left;  . Skin split graft  03/13/2012    Procedure: SKIN GRAFT SPLIT THICKNESS;  Surgeon: Nadara Mustard, MD;  Location: [facility] OR;  Service: Orthopedics;  Laterality: Left;  Excisional Debridement Left Leg, Split Thickness Skin Graft, Wound VAC  . Back stimulator    . Apligraft placement Left 07/31/2012    Procedure: Apply Skin Graft;  Surgeon: Nadara Mustard, MD;  Location: [facility] OR;  Service: Orthopedics;  Laterality: Left;  . Application of wound vac Left 07/31/2012    Procedure: APPLICATION OF WOUND VAC;  Surgeon: Nadara Mustard, MD;  Location: [facility] OR;  Service: Orthopedics;  Laterality: Left;  . Cardiac catheterization      Hx: of " around 1985 at [facility]"  . Cyst excision      Hx: of  . I&d extremity Left 11/06/2012    Procedure: IRRIGATION AND DEBRIDEMENT EXTREMITY-Left lower ;  Surgeon: Nadara Mustard, MD;  Location: [facility] OR;  Service: Orthopedics;  Laterality: Left;  Left Leg Irrigation and Debridement, Place Skin Graft, Wound VAC, [place]  . Amputation Left 11/25/2012    Procedure: AMPUTATION BELOW KNEE;  Surgeon: Nadara Mustard, MD;  Location: [facility] OR;  Service: Orthopedics;  Laterality: Left;  Left Below Knee Amputation   Family History  Problem Relation Age of Onset  . Heart disease Brother    Social History:  reports that he quit smoking about 21 years ago. His smoking use included Cigarettes. He has a 3 pack-year smoking history. He has never used smokeless tobacco. He reports that  drinks alcohol. He reports that he uses illicit drugs.   Allergies: No Known Allergies   Medications Prior to Admission  Medication Sig Dispense Refill  . albuterol (PROVENTIL) (2.5 MG/3ML) 0.083% nebulizer solution Take 2.5 mg by nebulization daily as needed for wheezing or shortness of breath.       . [EXPIRED] dextrose 5 % SOLN 50 mL with ceFEPIme 2 G SOLR 2 g Inject 2 g into the vein every 12 (twelve) hours.  8 Package  0  . DULoxetine (CYMBALTA) 30 MG  capsule Take 30 mg by mouth daily.      . ferrous sulfate 325 (65 FE) MG tablet Take 325 mg by mouth 3 (three) times daily with meals.       . Fluticasone-Salmeterol (ADVAIR) 250-50 MCG/DOSE AEPB Inhale 1 puff into the lungs daily.      . furosemide (LASIX) 40 MG tablet Take 40 mg by mouth daily.       [name] levalbuterol (XOPENEX HFA) 45 MCG/ACT inhaler Inhale 2 puffs into the lungs daily as needed for wheezing or shortness of breath.       . montelukast (SINGULAIR) 10 MG tablet Take 10 mg by mouth at bedtime.       [name] oxyCODONE (OXY IR/ROXICODONE) 5 MG immediate release tablet Take 1 tablet (5 mg total) by mouth 5 (five) times daily as needed for pain.  30 tablet  0  . OxyCODONE (OXYCONTIN) 15 mg T12A 12 hr tablet Take 1 tablet (15 mg total) by mouth every 12 (twelve) hours.  30 tablet  0  . potassium chloride SA (K-DUR,KLOR-CON) 20 MEQ tablet Take 20 mEq by mouth daily.      . pravastatin (PRAVACHOL) 10 MG tablet Take 10 mg by mouth at bedtime.       . topiramate (TOPAMAX) 100 MG tablet Take 100 mg by mouth 2 (two) times daily. Also takes 50 mg daily at noon      . vancomycin (VANCOCIN) 1 GM/200ML SOLN Inject 200 mLs (1,000 mg total) into the vein every 12 (twelve) hours.  4000 mL  0  . Vitamin D, Ergocalciferol, (DRISDOL) 50000 UNITS CAPS Take 50,000 Units by mouth every 7 (seven) days. Sunday      . warfarin (COUMADIN) 5 MG tablet Take 7.5 mg by mouth daily.      . fentaNYL (DURAGESIC - DOSED MCG/HR) 25 MCG/HR Place 1 patch (25 mcg total) onto the skin every 3 (three) days.  5 patch  0    Home: Home Living Family/patient expects to be discharged to:: Private residence Living Arrangements: Spouse/significant other Available Help at Discharge: Family;Available PRN/intermittently Type of Home: House Home Access: Level entry Home Layout: One level Home Equipment: None  Functional History: Prior Function Comments: on 3LO2 at home Functional Status:  Mobility: Bed Mobility Bed Mobility:  Supine to Sit;Sitting - Scoot to Edge of Bed Supine to Sit: 5: Supervision;HOB flat;With rails Sitting - Scoot to Edge of Bed: 5: Supervision Transfers Transfers: Sit to Stand;Stand to Sit Sit to Stand: 1: +2 Total assist;With upper extremity assist;From bed Sit to Stand: Patient Percentage: 70% Stand to Sit: 1: +2 Total assist;With armrests;To chair/3-in-1 Stand to Sit: Patient Percentage: 70% Ambulation/Gait Ambulation/Gait Assistance: 1: +2  Total assist Ambulation/Gait: Patient Percentage: 70% Ambulation Distance (Feet): 2 Feet Assistive device: Rolling walker Ambulation/Gait Assistance Details: Pt. only albe to tolerate 2 turning steps with RW and 2 assist.  Had difficulty with bearing weight on UEs through RW to take step with right foot.  Mostly shuffeled foot around to recliner. Gait Pattern: Step-to pattern    ADL:    Cognition: Cognition Overall Cognitive Status: Within Functional Limits for tasks assessed Orientation Level: Oriented X4 Cognition Arousal/Alertness: Awake/alert Behavior During Therapy: WFL for tasks assessed/performed Overall Cognitive Status: Within Functional Limits for tasks assessed  Blood pressure 128/51, pulse 70, temperature 98.2 F (36.8 C), temperature source Oral, resp. rate 17, height 5\' 10"  (1.778 m), weight 122.471 kg (270 lb), SpO2 99.00%.  Physical Exam  Nursing note and vitals reviewed. Constitutional: He is oriented to person, place, and time. He appears well-developed and well-nourished.  HENT:  Head: Normocephalic and atraumatic.  Eyes: Conjunctivae are normal. Pupils are equal, round, and reactive to light.  Neck: Normal range of motion. Neck supple.  Cardiovascular: Normal rate and regular rhythm.   No murmur heard. Pulmonary/Chest: Effort normal and breath sounds normal. No respiratory distress. He has no wheezes.  Abdominal: Soft. Bowel sounds are normal. He exhibits no distension. There is no tenderness.  Dried blood on  abdomen from abraded area.  Musculoskeletal:  L-BKA with compressive dressing--tends to keep flexed at knee.   Neurological: He is alert and oriented to person, place, and time.  Skin: Skin is warm and dry.   motor strength 4/5 bilateral deltoid, bicep, tricep, grip. 3 minus left hip flexor 4 minus right hip flexor knee extensor ankle dorsiflexor and plantar flexor. Sensation reduced to light touch in the right foot. Intact light touch in both hands Right foot and both hands have evidence of intrinsic muscle atrophy  Results for orders placed during the hospital encounter of 11/25/12 (from the past 24 hour(s))  PROTIME-INR     Status: Abnormal   Collection Time    11/26/12  5:07 AM      Result Value Range   Prothrombin Time 23.5 (*) 11.6 - 15.2 seconds   INR 2.17 (*) 0.00 - 1.49  CBC WITH DIFFERENTIAL     Status: Abnormal   Collection Time    11/26/12  5:07 AM      Result Value Range   WBC 10.7 (*) 4.0 - 10.5 K/uL   RBC 3.25 (*) 4.22 - 5.81 MIL/uL   Hemoglobin 10.3 (*) 13.0 - 17.0 g/dL   HCT 96.0 (*) 45.4 - 09.8 %   MCV 93.5  78.0 - 100.0 fL   MCH 31.7  26.0 - 34.0 pg   MCHC 33.9  30.0 - 36.0 g/dL   RDW 11.9  14.7 - 82.9 %   Platelets 149 (*) 150 - 400 K/uL   Neutrophils Relative % 72  43 - 77 %   Neutro Abs 7.6  1.7 - 7.7 K/uL   Lymphocytes Relative 10 (*) 12 - 46 %   Lymphs Abs 1.1  0.7 - 4.0 K/uL   Monocytes Relative 15 (*) 3 - 12 %   Monocytes Absolute 1.6 (*) 0.1 - 1.0 K/uL   Eosinophils Relative 3  0 - 5 %   Eosinophils Absolute 0.3  0.0 - 0.7 K/uL   Basophils Relative 1  0 - 1 %   Basophils Absolute 0.1  0.0 - 0.1 K/uL   No results found.  Assessment/Plan: Diagnosis: Left BKA postoperative day #1 1.  Does the need for close, 24 hr/day medical supervision in concert with the patient's rehab needs make it unreasonable for this patient to be served in a less intensive setting? Yes 2. Co-Morbidities requiring supervision/potential complications: Diabetes with  neuropathy, psoriasis, COPD on chronic 02 3. Due to bladder management, bowel management, safety, skin/wound care, disease management, medication administration, pain management and patient education, does the patient require 24 hr/day rehab nursing? Yes 4. Does the patient require coordinated care of a physician, rehab nurse, PT (1-2 hrs/day, 5 days/week) and OT (1-2 hrs/day, 5 days/week) to address physical and functional deficits in the context of the above medical diagnosis(es)? Yes Addressing deficits in the following areas: balance, endurance, locomotion, strength, transferring, bathing, dressing, feeding and toileting 5. Can the patient actively participate in an intensive therapy program of at least 3 hrs of therapy per day at least 5 days per week? Potentially 6. The potential for patient to make measurable gains while on inpatient rehab is good 7. Anticipated functional outcomes upon discharge from inpatient rehab are supervision to modified independent mobility with PT, modified independent ADLs with OT, not applicable with SLP. 8. Estimated rehab length of stay to reach the above functional goals is: 7-10 days 9. Does the patient have adequate social supports to accommodate these discharge functional goals? Yes 10. Anticipated D/C setting: Home 11. Anticipated post D/C treatments: HH therapy 12. Overall Rehab/Functional Prognosis: good  RECOMMENDATIONS: This patient's condition is appropriate for continued rehabilitative care in the following setting: CIR Patient has agreed to participate in recommended program. Yes Note that insurance prior authorization may be required for reimbursement for recommended care.  Comment: Should be ready in 1-2 days    11/26/2012

## 2012-11-26 NOTE — Progress Notes (Addendum)
Patient ID: Jeremy [name], male   DOB: 1950/11/19, 62 y.o.   MRN: [id number] Postoperative day 1 left transtibial amputation. Patient had massive calcification of the fascial layer. Discussed with the patient that this will definitely impact healing. Plan for discharge to skilled nursing facility. Patient is safe for discharge at this time. CBC pending this morning. Patient received 2 units of packed red blood cells secondary to acute blood loss anemia from surgery.

## 2012-11-27 ENCOUNTER — Inpatient Hospital Stay (HOSPITAL_COMMUNITY)
Admission: RE | Admit: 2012-11-27 | Discharge: 2012-12-09 | DRG: 945 | Disposition: A | Discharge status: Home Health | Payer: Medicare Other | Source: Intra-hospital | Attending: Physical Medicine & Rehabilitation | Admitting: Physical Medicine & Rehabilitation

## 2012-11-27 DIAGNOSIS — G4733 Obstructive sleep apnea (adult) (pediatric): Secondary | ICD-10-CM

## 2012-11-27 DIAGNOSIS — Z86718 Personal history of other venous thrombosis and embolism: Secondary | ICD-10-CM

## 2012-11-27 DIAGNOSIS — Z87891 Personal history of nicotine dependence: Secondary | ICD-10-CM

## 2012-11-27 DIAGNOSIS — D62 Acute posthemorrhagic anemia: Secondary | ICD-10-CM

## 2012-11-27 DIAGNOSIS — Z8614 Personal history of Methicillin resistant Staphylococcus aureus infection: Secondary | ICD-10-CM

## 2012-11-27 DIAGNOSIS — S88119A Complete traumatic amputation at level between knee and ankle, unspecified lower leg, initial encounter: Secondary | ICD-10-CM

## 2012-11-27 DIAGNOSIS — Z7901 Long term (current) use of anticoagulants: Secondary | ICD-10-CM

## 2012-11-27 DIAGNOSIS — L97909 Non-pressure chronic ulcer of unspecified part of unspecified lower leg with unspecified severity: Secondary | ICD-10-CM

## 2012-11-27 DIAGNOSIS — G8929 Other chronic pain: Secondary | ICD-10-CM

## 2012-11-27 DIAGNOSIS — E1159 Type 2 diabetes mellitus with other circulatory complications: Secondary | ICD-10-CM

## 2012-11-27 DIAGNOSIS — Z85828 Personal history of other malignant neoplasm of skin: Secondary | ICD-10-CM

## 2012-11-27 DIAGNOSIS — Z79899 Other long term (current) drug therapy: Secondary | ICD-10-CM

## 2012-11-27 DIAGNOSIS — F607 Dependent personality disorder: Secondary | ICD-10-CM

## 2012-11-27 DIAGNOSIS — I509 Heart failure, unspecified: Secondary | ICD-10-CM

## 2012-11-27 DIAGNOSIS — Z6841 Body Mass Index (BMI) 40.0 and over, adult: Secondary | ICD-10-CM

## 2012-11-27 DIAGNOSIS — Z9884 Bariatric surgery status: Secondary | ICD-10-CM

## 2012-11-27 DIAGNOSIS — Z9981 Dependence on supplemental oxygen: Secondary | ICD-10-CM

## 2012-11-27 DIAGNOSIS — E876 Hypokalemia: Secondary | ICD-10-CM

## 2012-11-27 DIAGNOSIS — I739 Peripheral vascular disease, unspecified: Secondary | ICD-10-CM | POA: Diagnosis present

## 2012-11-27 DIAGNOSIS — L98499 Non-pressure chronic ulcer of skin of other sites with unspecified severity: Secondary | ICD-10-CM

## 2012-11-27 DIAGNOSIS — K219 Gastro-esophageal reflux disease without esophagitis: Secondary | ICD-10-CM

## 2012-11-27 DIAGNOSIS — J4489 Other specified chronic obstructive pulmonary disease: Secondary | ICD-10-CM

## 2012-11-27 DIAGNOSIS — E1151 Type 2 diabetes mellitus with diabetic peripheral angiopathy without gangrene: Secondary | ICD-10-CM | POA: Diagnosis present

## 2012-11-27 DIAGNOSIS — G609 Hereditary and idiopathic neuropathy, unspecified: Secondary | ICD-10-CM

## 2012-11-27 DIAGNOSIS — L02419 Cutaneous abscess of limb, unspecified: Secondary | ICD-10-CM

## 2012-11-27 DIAGNOSIS — L03119 Cellulitis of unspecified part of limb: Secondary | ICD-10-CM

## 2012-11-27 DIAGNOSIS — L409 Psoriasis, unspecified: Secondary | ICD-10-CM | POA: Diagnosis present

## 2012-11-27 DIAGNOSIS — I798 Other disorders of arteries, arterioles and capillaries in diseases classified elsewhere: Secondary | ICD-10-CM

## 2012-11-27 DIAGNOSIS — J449 Chronic obstructive pulmonary disease, unspecified: Secondary | ICD-10-CM | POA: Diagnosis present

## 2012-11-27 DIAGNOSIS — Z5189 Encounter for other specified aftercare: Principal | ICD-10-CM

## 2012-11-27 LAB — CBC WITH DIFFERENTIAL/PLATELET
Basophils Absolute: 0 10*3/uL (ref 0.0–0.1)
Basophils Relative: 0 % (ref 0–1)
HCT: 29.7 % — ABNORMAL LOW (ref 39.0–52.0)
MCHC: 33 g/dL (ref 30.0–36.0)
Monocytes Absolute: 1.3 10*3/uL — ABNORMAL HIGH (ref 0.1–1.0)
Neutro Abs: 5.5 10*3/uL (ref 1.7–7.7)
Neutrophils Relative %: 65 % (ref 43–77)
Platelets: 136 10*3/uL — ABNORMAL LOW (ref 150–400)
RDW: 14.1 % (ref 11.5–15.5)
WBC: 8.6 10*3/uL (ref 4.0–10.5)

## 2012-11-27 LAB — PROTIME-INR
INR: 2.12 — ABNORMAL HIGH (ref 0.00–1.49)
Prothrombin Time: 23.1 seconds — ABNORMAL HIGH (ref 11.6–15.2)

## 2012-11-27 MED ORDER — OXYCODONE HCL 5 MG PO TABS
5.0000 mg | ORAL_TABLET | Freq: Every day | ORAL | Status: DC | PRN
  Administered 2012-11-27 – 2012-12-05 (×34): 5 mg via ORAL
  Filled 2012-11-27 (×32): qty 1
  Filled 2012-11-27: qty 2
  Filled 2012-11-27 (×4): qty 1

## 2012-11-27 MED ORDER — POTASSIUM CHLORIDE CRYS ER 20 MEQ PO TBCR
20.0000 meq | EXTENDED_RELEASE_TABLET | Freq: Two times a day (BID) | ORAL | Status: DC
  Administered 2012-11-27 – 2012-12-09 (×24): 20 meq via ORAL
  Filled 2012-11-27 (×26): qty 1

## 2012-11-27 MED ORDER — DULOXETINE HCL 30 MG PO CPEP
30.0000 mg | ORAL_CAPSULE | Freq: Every day | ORAL | Status: DC
  Administered 2012-11-28 – 2012-12-09 (×12): 30 mg via ORAL
  Filled 2012-11-27 (×13): qty 1

## 2012-11-27 MED ORDER — GUAIFENESIN 100 MG/5ML PO SYRP
200.0000 mg | ORAL_SOLUTION | ORAL | Status: DC | PRN
  Administered 2012-11-28 – 2012-11-29 (×2): 200 mg via ORAL
  Filled 2012-11-27 (×2): qty 10

## 2012-11-27 MED ORDER — ALBUTEROL SULFATE (5 MG/ML) 0.5% IN NEBU
2.5000 mg | INHALATION_SOLUTION | RESPIRATORY_TRACT | Status: DC | PRN
  Administered 2012-12-01: 2.5 mg via RESPIRATORY_TRACT
  Filled 2012-11-27 (×2): qty 0.5

## 2012-11-27 MED ORDER — LEVALBUTEROL TARTRATE 45 MCG/ACT IN AERO
2.0000 | INHALATION_SPRAY | Freq: Every day | RESPIRATORY_TRACT | Status: DC | PRN
  Administered 2012-11-30 – 2012-12-01 (×4): 2 via RESPIRATORY_TRACT
  Filled 2012-11-27: qty 15

## 2012-11-27 MED ORDER — FUROSEMIDE 40 MG PO TABS
40.0000 mg | ORAL_TABLET | Freq: Every day | ORAL | Status: DC
  Administered 2012-11-28 – 2012-11-29 (×2): 40 mg via ORAL
  Filled 2012-11-27 (×4): qty 1

## 2012-11-27 MED ORDER — WARFARIN SODIUM 7.5 MG PO TABS
7.5000 mg | ORAL_TABLET | Freq: Once | ORAL | Status: AC
  Administered 2012-11-27: 7.5 mg via ORAL
  Filled 2012-11-27: qty 1

## 2012-11-27 MED ORDER — MONTELUKAST SODIUM 10 MG PO TABS
10.0000 mg | ORAL_TABLET | Freq: Every day | ORAL | Status: DC
  Administered 2012-11-27 – 2012-12-08 (×12): 10 mg via ORAL
  Filled 2012-11-27 (×14): qty 1

## 2012-11-27 MED ORDER — BISACODYL 10 MG RE SUPP
10.0000 mg | Freq: Every day | RECTAL | Status: DC | PRN
  Filled 2012-11-27: qty 1

## 2012-11-27 MED ORDER — ACETAMINOPHEN 325 MG PO TABS
325.0000 mg | ORAL_TABLET | ORAL | Status: DC | PRN
  Administered 2012-12-05 – 2012-12-08 (×3): 650 mg via ORAL
  Filled 2012-11-27 (×5): qty 2

## 2012-11-27 MED ORDER — ALUM & MAG HYDROXIDE-SIMETH 200-200-20 MG/5ML PO SUSP: 30.0000 mL | ORAL | Status: DC | PRN

## 2012-11-27 MED ORDER — FENTANYL 25 MCG/HR TD PT72
25.0000 ug | MEDICATED_PATCH | TRANSDERMAL | Status: DC
  Administered 2012-11-28 – 2012-12-07 (×4): 25 ug via TRANSDERMAL
  Filled 2012-11-27 (×4): qty 1

## 2012-11-27 MED ORDER — WARFARIN - PHARMACIST DOSING INPATIENT
Freq: Every day | Status: DC
  Administered 2012-12-07: 16:00:00

## 2012-11-27 MED ORDER — PROCHLORPERAZINE 25 MG RE SUPP
12.5000 mg | Freq: Four times a day (QID) | RECTAL | Status: DC | PRN
  Filled 2012-11-27: qty 1

## 2012-11-27 MED ORDER — TRAZODONE HCL 50 MG PO TABS
50.0000 mg | ORAL_TABLET | Freq: Every evening | ORAL | Status: DC | PRN
  Administered 2012-11-27 – 2012-12-08 (×8): 50 mg via ORAL
  Filled 2012-11-27 (×9): qty 1

## 2012-11-27 MED ORDER — TOPIRAMATE 100 MG PO TABS
100.0000 mg | ORAL_TABLET | Freq: Two times a day (BID) | ORAL | Status: DC
  Administered 2012-11-27 – 2012-12-09 (×24): 100 mg via ORAL
  Filled 2012-11-27 (×26): qty 1

## 2012-11-27 MED ORDER — PROCHLORPERAZINE EDISYLATE 5 MG/ML IJ SOLN
5.0000 mg | Freq: Four times a day (QID) | INTRAMUSCULAR | Status: DC | PRN
  Filled 2012-11-27: qty 2

## 2012-11-27 MED ORDER — FLEET ENEMA 7-19 GM/118ML RE ENEM: 1.0000 | ENEMA | Freq: Once | RECTAL | Status: AC | PRN

## 2012-11-27 MED ORDER — DEXTROSE 5 % IV SOLN: 2.0000 g | Freq: Two times a day (BID) | INTRAVENOUS | Status: AC

## 2012-11-27 MED ORDER — PROCHLORPERAZINE MALEATE 5 MG PO TABS
5.0000 mg | ORAL_TABLET | Freq: Four times a day (QID) | ORAL | Status: DC | PRN
  Filled 2012-11-27: qty 2

## 2012-11-27 MED ORDER — GUAIFENESIN-DM 100-10 MG/5ML PO SYRP: 5.0000 mL | ORAL_SOLUTION | Freq: Four times a day (QID) | ORAL | Status: DC | PRN

## 2012-11-27 MED ORDER — OXYCODONE HCL ER 15 MG PO T12A
15.0000 mg | EXTENDED_RELEASE_TABLET | Freq: Two times a day (BID) | ORAL | Status: DC
  Administered 2012-11-27 – 2012-12-09 (×24): 15 mg via ORAL
  Filled 2012-11-27 (×24): qty 1

## 2012-11-27 MED ORDER — FLUOCINONIDE-E 0.05 % EX CREA
TOPICAL_CREAM | Freq: Three times a day (TID) | CUTANEOUS | Status: DC
  Administered 2012-11-27 – 2012-12-08 (×26): via TOPICAL
  Filled 2012-11-27 (×3): qty 15

## 2012-11-27 MED ORDER — SIMVASTATIN 5 MG PO TABS
5.0000 mg | ORAL_TABLET | Freq: Every day | ORAL | Status: DC
  Administered 2012-11-27 – 2012-12-08 (×12): 5 mg via ORAL
  Filled 2012-11-27 (×14): qty 1

## 2012-11-27 MED ORDER — MOMETASONE FURO-FORMOTEROL FUM 100-5 MCG/ACT IN AERO
2.0000 | INHALATION_SPRAY | Freq: Two times a day (BID) | RESPIRATORY_TRACT | Status: DC
  Administered 2012-11-27 – 2012-12-06 (×18): 2 via RESPIRATORY_TRACT

## 2012-11-27 MED ORDER — POLYETHYLENE GLYCOL 3350 17 G PO PACK
17.0000 g | PACK | Freq: Every day | ORAL | Status: DC | PRN
  Filled 2012-11-27 (×2): qty 1

## 2012-11-27 MED ORDER — PRO-STAT SUGAR FREE PO LIQD
30.0000 mL | Freq: Two times a day (BID) | ORAL | Status: DC
  Administered 2012-11-27 – 2012-12-02 (×10): 30 mL via ORAL
  Filled 2012-11-27 (×12): qty 30

## 2012-11-27 MED ORDER — FERROUS SULFATE 325 (65 FE) MG PO TABS
325.0000 mg | ORAL_TABLET | Freq: Three times a day (TID) | ORAL | Status: DC
  Administered 2012-11-27 – 2012-12-09 (×35): 325 mg via ORAL
  Filled 2012-11-27 (×38): qty 1

## 2012-11-27 MED ORDER — DIPHENHYDRAMINE HCL 12.5 MG/5ML PO ELIX
12.5000 mg | ORAL_SOLUTION | Freq: Four times a day (QID) | ORAL | Status: DC | PRN
  Administered 2012-12-05: 25 mg via ORAL
  Filled 2012-11-27 (×2): qty 10

## 2012-11-27 NOTE — Progress Notes (Signed)
Physical Therapy Treatment Patient Details Name: Jeremy [name] MRN: [id number] DOB: [birth year] Today's Date: 11/27/2012 Time: 1205-1220 PT Time Calculation (min): 15 min  PT Assessment / Plan / Recommendation  History of Present Illness Pt. admitted with long term nonhealing ulcer L LE,having failed conservative management,  and underwent L transtibial amputation.   PT Comments   Pt. With continued progress with mobility and staff was educated on safe transfer technique back to bed for them to assist pt. On into/oob on PRN basis.  Follow Up Recommendations  CIR;Supervision/Assistance - 24 hour;Supervision for mobility/OOB     Does the patient have the potential to tolerate intense rehabilitation     Barriers to Discharge        Equipment Recommendations  Rolling walker with 5" wheels    Recommendations for Other Services Rehab consult  Frequency Min 5X/week   Progress towards PT Goals Progress towards PT goals: Progressing toward goals  Plan Current plan remains appropriate    Precautions / Restrictions Precautions Precautions: Fall Restrictions Weight Bearing Restrictions: Yes LLE Weight Bearing: Non weight bearing   Pertinent Vitals/Pain See vitals tab     Mobility  Bed Mobility Bed Mobility: Sit to Supine Sit to Supine: 3: Mod assist Details for Bed Mobility Assistance: vc's for technique and mod assist to control descent to lying from sitting Transfers Transfers: Sit to Stand;Stand to Sit Sit to Stand: 1: +2 Total assist;With armrests;From chair/3-in-1 Sit to Stand: Patient Percentage: 70% Stand to Sit: 1: +2 Total assist;With upper extremity assist;To bed Stand to Sit: Patient Percentage: 70% Details for Transfer Assistance: vc's for safety and correct hand placement Ambulation/Gait Ambulation/Gait Assistance: 1: +2 Total assist Ambulation/Gait: Patient Percentage: 70% Ambulation Distance (Feet): 2 Feet Assistive device: Rolling walker Ambulation/Gait Assistance  Details: Pt. able to make 2 single leg hops from recliner to bed with 2 assist , pt. 70%.   Gait Pattern: Step-to pattern    Exercises     PT Diagnosis:    PT Problem List:   PT Treatment Interventions:     PT Goals (current goals can now be found in the care plan section)    Visit Information  Last PT Received On: 11/27/12 Assistance Needed: +2 History of Present Illness: Pt. admitted with long term nonhealing ulcer L LE,having failed conservative management,  and underwent L transtibial amputation.    Subjective Data  Subjective: requesting to go back to bed   Cognition  Cognition Arousal/Alertness: Awake/alert Behavior During Therapy: WFL for tasks assessed/performed Overall Cognitive Status: Within Functional Limits for tasks assessed    Balance     End of Session PT - End of Session Equipment Utilized During Treatment: Gait belt;Oxygen Activity Tolerance: Patient tolerated treatment well Patient left: in bed;with call bell/phone within reach;with family/visitor present Nurse Communication: Mobility status   GP     Ferman Hamming 11/27/2012, 1:59 PM Weldon Picking PT Acute Rehab Services [phone] [place] [phone]

## 2012-11-27 NOTE — Evaluation (Signed)
Occupational Therapy Evaluation Patient Details Name: Jeremy [name] MRN: [id number] DOB: 03/19/1951 Today's Date: 11/27/2012 Time: 2956-2130 OT Time Calculation (min): 28 min  OT Assessment / Plan / Recommendation History of present illness Pt. admitted with long term nonhealing ulcer L LE,having failed conservative management,  and underwent L transtibial amputation.   Clinical Impression   Pt s/p amputation with deficits listed below.  Pt doing well with adls but would benefit from cont OT to achieve mod I level of care with all adls so he can d/c home with his wife work works.    OT Assessment  Patient needs continued OT Services    Follow Up Recommendations  CIR    Barriers to Discharge   Wife states she can take a few days off before a  weekend to be able to have 4-5 days at home with pt after d/c before she has to return to work.  Equipment Recommendations  Tub/shower bench;3 in 1 bedside comode    Recommendations for Other Services Rehab consult  Frequency  Min 2X/week    Precautions / Restrictions Precautions Precautions: Fall Precaution Comments: chronic O2 Restrictions Weight Bearing Restrictions: Yes LLE Weight Bearing: Non weight bearing   Pertinent Vitals/Pain Pt started session at 4/10 and ended session with 8/10.  Pt on 3L O2.    ADL  Eating/Feeding: Performed;Independent Where Assessed - Eating/Feeding: Chair Grooming: Performed;Wash/dry hands;Wash/dry face;Teeth care;Set up Where Assessed - Grooming: Supported sitting Upper Body Bathing: Simulated;Set up Where Assessed - Upper Body Bathing: Supported sitting Lower Body Bathing: Simulated;Minimal assistance Where Assessed - Lower Body Bathing: Supported sit to stand Upper Body Dressing: Simulated;Set up Where Assessed - Upper Body Dressing: Supported sitting Lower Body Dressing: Performed;Moderate assistance Where Assessed - Lower Body Dressing: Supported sit to Pharmacist, hospital: Performed;Moderate  assistance Toilet Transfer Method: Stand pivot Toilet Transfer Equipment: Bedside commode Toileting - Clothing Manipulation and Hygiene: Simulated;Moderate assistance Where Assessed - Toileting Clothing Manipulation and Hygiene: Standing Equipment Used: Gait belt;Rolling walker Transfers/Ambulation Related to ADLs: Pt walked appx 7 steps in room with walker and total A x2 (pt 70%). ADL Comments: pt does well with adls. Sitting balance good enought to donn and doff sock w/o assist.  More assist needed when standing due to decreased balance and endurance in standing.    OT Diagnosis: Generalized weakness;Acute pain  OT Problem List: Decreased strength;Impaired balance (sitting and/or standing);Decreased knowledge of use of DME or AE;Pain OT Treatment Interventions: Self-care/ADL training;Therapeutic activities   OT Goals(Current goals can be found in the care plan section) Acute Rehab OT Goals Patient Stated Goal: rehab then home OT Goal Formulation: With patient/family Time For Goal Achievement: 12/11/12 Potential to Achieve Goals: Good ADL Goals Pt Will Perform Grooming: with supervision;standing Pt Will Perform Lower Body Bathing: with supervision;sit to/from stand Pt Will Perform Lower Body Dressing: with supervision;sit to/from stand Pt Will Perform Tub/Shower Transfer: Tub transfer;with supervision;ambulating;tub bench Additional ADL Goal #1: Pt will complete all aspects of toileting with S with 3:1 over commode and walker to stand.  Visit Information  Last OT Received On: 11/27/12 Assistance Needed: +2 PT/OT Co-Evaluation/Treatment: Yes History of Present Illness: Pt. admitted with long term nonhealing ulcer L LE,having failed conservative management,  and underwent L transtibial amputation.       Prior Functioning     Home Living Family/patient expects to be discharged to:: Private residence Living Arrangements: Spouse/significant other Available Help at Discharge:  Family;Available PRN/intermittently Type of Home: House Home Access: Level entry Home Layout:  One level Home Equipment: None Prior Function Level of Independence: Independent Comments: on 3LO2 at home Communication Communication: No difficulties Dominant Hand: Right         Vision/Perception Vision - History Baseline Vision: Wears glasses only for reading Patient Visual Report: No change from baseline Vision - Assessment Vision Assessment: Vision not tested   Cognition  Cognition Arousal/Alertness: Awake/alert Behavior During Therapy: WFL for tasks assessed/performed Overall Cognitive Status: Within Functional Limits for tasks assessed    Extremity/Trunk Assessment Upper Extremity Assessment Upper Extremity Assessment: Overall WFL for tasks assessed Lower Extremity Assessment Lower Extremity Assessment: Defer to PT evaluation Cervical / Trunk Assessment Cervical / Trunk Assessment: Normal     Mobility Bed Mobility Bed Mobility: Supine to Sit;Sitting - Scoot to Edge of Bed Supine to Sit: 5: Supervision;HOB flat;With rails Sitting - Scoot to Edge of Bed: 5: Supervision Sit to Supine: Not Tested (comment) Details for Bed Mobility Assistance: vc'as for safe tecnique Transfers Transfers: Sit to Stand;Stand to Sit Sit to Stand: 1: +2 Total assist;With upper extremity assist;From bed Sit to Stand: Patient Percentage: 70% Stand to Sit: 1: +2 Total assist;With armrests;To chair/3-in-1 Stand to Sit: Patient Percentage: 70% Details for Transfer Assistance: vc's for safety and correct hand placement     Exercise Amputee Exercises Quad Sets: AROM;Left;10 reps   Balance Balance Balance Assessed: Yes Static Sitting Balance Static Sitting - Balance Support: No upper extremity supported;Feet supported Static Sitting - Level of Assistance: 7: Independent Static Sitting - Comment/# of Minutes: 3 Static Standing Balance Static Standing - Balance Support: Bilateral upper  extremity supported;During functional activity Static Standing - Level of Assistance: 4: Min assist   End of Session OT - End of Session Equipment Utilized During Treatment: Rolling walker;Gait belt Activity Tolerance: Patient tolerated treatment well Patient left: in chair;with call bell/phone within reach;with family/visitor present Nurse Communication: Mobility status;Patient requests pain meds  GO     Hope Budds 11/27/2012, 9:43 AM [phone]

## 2012-11-27 NOTE — Plan of Care (Signed)
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  Asthma  .  Shortness of breath  .  Leg pain  .  Peripheral vascular disease  .  Sleep apnea  uses bipap, sleep study done in [place] 2 years ago, dr Cala Bradford byrd  .  Anemia  .  Headache(784.0)  sinus headaches  .  COPD (chronic obstructive pulmonary disease)  3L continuous  .  Peripheral neuropathy  .  Cancer  hx basal cell skin cancer  .  Psoriasis  .  DVT (deep venous thrombosis)  LLE DVT '80's  .  CHF (congestive heart failure)  does not see a cardiologist  .  Diabetes mellitus    Functional Problem List  Patient demonstrates impairments in the following areas: Balance, Bladder, Bowel, Endurance, Pain, Safety and Skin Integrity  Basic ADL's: bathing, dressing and toileting Advanced ADL's: simple meal preparation  Transfers:  bed mobility, bed to chair, toilet, tub/shower, car and furniture Locomotion:  ambulation and wheelchair mobility  Additional Impairments:  None  Anticipated Outcomes Item Anticipated Outcome  Eating/Swallowing    Basic self-care  Set up  Tolieting  Mod I to [facility] or toilet from w/c level  Bowel/Bladder  Mod I  Transfers  Mod-I  Locomotion  Mod-I x 50' using RW Mod-I x 150' using w/c  Communication    Cognition    Pain  <5  Safety/Judgment    Other     Therapy Plan: PT Intensity: Minimum of 1-2 x/day ,45 to 90 minutes PT Frequency: 5 out of 7 days PT Duration Estimated Length of Stay: 7-10 days OT Intensity: Minimum of 1-2 x/day, 45 to 90 minutes OT Frequency: 5 out of 7 days OT Duration/Estimated Length of Stay: 7-10 days      Team Interventions: Item RN PT OT SLP SW TR Other  Self Care/Advanced ADL Retraining   x      Neuromuscular Re-Education         Therapeutic Activities  x x      UE/LE Strength  Training/ROM  x x      UE/LE Coordination Activities         Visual/Perceptual Remediation/Compensation         DME/Adaptive Equipment Instruction  x x      Therapeutic Exercise  x x      Balance/Vestibular Training  x x      Patient/Family Education  x x      Cognitive Remediation/Compensation         Functional Mobility Training  x x      Ambulation/Gait Training  x       Engineer, structural Propulsion/Positioning  x       Surveyor, mining Facilitation         Bladder Management x        Bowel Management x        Disease Management/Prevention x        Pain Management x        Medication Management x        Skin Care/Wound Management x        Splinting/Orthotics  Discharge Planning x  x      Psychosocial Support x  x                             Team Discharge Planning: Destination: PT-Home ,OT- Home , SLP-  Projected Follow-up: PT-Home health PT, OT-  Home health OT, SLP-  Projected Equipment Needs: PT- , OT- 3 in 1 bedside comode;Tub/shower bench, SLP-  Patient/family involved in discharge planning: PT- Patient,  OT-Patient, SLP-   MD ELOS: 7-10 days Medical Rehab Prognosis:  Good Assessment: 62 year old male with non-healing left foot lesion, diabetes with neuropathy, underwent left BKA 11/25/2012. Now requiring 24/7 Rehab RN,MD, as well as CIR level PT, OT .  Treatment team will focus on ADLs and mobility with goals set at Mod I.   See Team Conference Notes for weekly updates to the plan of care

## 2012-11-27 NOTE — Progress Notes (Signed)
Physical Therapy Treatment Patient Details Name: Jeremy [name] MRN: [id number] DOB: [birth year] Today's Date: 11/27/2012 Time: 6578-4696 PT Time Calculation (min): 23 min  PT Assessment / Plan / Recommendation  History of Present Illness Pt. admitted with long term nonhealing ulcer L LE,having failed conservative management,  and underwent L transtibial amputation.   PT Comments   Pt. Made good progress with mobility today compared to yesterday and was able to take about 5 steps using single leg hop strategy.  He is being considered for CIR , which is the post post acute rehab option for this patient.    Follow Up Recommendations  CIR;Supervision/Assistance - 24 hour;Supervision for mobility/OOB     Does the patient have the potential to tolerate intense rehabilitation     Barriers to Discharge        Equipment Recommendations  Rolling walker with 5" wheels    Recommendations for Other Services Rehab consult  Frequency Min 5X/week   Progress towards PT Goals Progress towards PT goals: Progressing toward goals  Plan Current plan remains appropriate    Precautions / Restrictions Precautions Precautions: Fall Precaution Comments: chronic O2 Restrictions Weight Bearing Restrictions: Yes LLE Weight Bearing: Non weight bearing   Pertinent Vitals/Pain See vitals tab     Mobility  Bed Mobility Bed Mobility: Supine to Sit;Sitting - Scoot to Edge of Bed Supine to Sit: 5: Supervision;HOB flat;With rails Sitting - Scoot to Edge of Bed: 5: Supervision Sit to Supine: Not Tested (comment) Details for Bed Mobility Assistance: vc'as for safe tecnique Transfers Transfers: Sit to Stand;Stand to Sit Sit to Stand: 1: +2 Total assist;With upper extremity assist;From bed Sit to Stand: Patient Percentage: 70% Stand to Sit: 1: +2 Total assist;With armrests;To chair/3-in-1 Stand to Sit: Patient Percentage: 70% Details for Transfer Assistance: vc's for safety and correct hand  placement Ambulation/Gait Ambulation/Gait Assistance: 1: +2 Total assist Ambulation/Gait: Patient Percentage: 70% Ambulation Distance (Feet): 5 Feet Assistive device: Rolling walker Ambulation/Gait Assistance Details: Pt. able to accept weight on UEs today for taking 5 steps with RW.   Gait Pattern: Step-to pattern (single leg hop) Gait velocity: decreased    Exercises Amputee Exercises Quad Sets: AROM;Left;10 reps   PT Diagnosis:    PT Problem List:   PT Treatment Interventions:     PT Goals (current goals can now be found in the care plan section) Acute Rehab PT Goals Patient Stated Goal: rehab then home  Visit Information  Last PT Received On: 11/27/12 Assistance Needed: +2 PT/OT Co-Evaluation/Treatment: Yes History of Present Illness: Pt. admitted with long term nonhealing ulcer L LE,having failed conservative management,  and underwent L transtibial amputation.    Subjective Data  Subjective: I feel like I am getting what my wife has (upper respiratory/throat illness).. Pt. noted to be coughing today Patient Stated Goal: rehab then home   Cognition  Cognition Arousal/Alertness: Awake/alert Behavior During Therapy: WFL for tasks assessed/performed Overall Cognitive Status: Within Functional Limits for tasks assessed    Balance     End of Session PT - End of Session Equipment Utilized During Treatment: Gait belt;Oxygen Activity Tolerance: Patient tolerated treatment well Patient left: in chair;with call bell/phone within reach;with family/visitor present Nurse Communication: Mobility status;Patient requests pain meds   GP     Ferman Hamming 11/27/2012, 9:36 AM Weldon Picking PT Acute Rehab Services [phone] [place] [phone]

## 2012-11-27 NOTE — Progress Notes (Signed)
Patient's last Bowel Movement was on 11/24/2012. The NT's took the patient to the bathroom at 1200, and the patient had a bowel movement in the [facility]. The last bowel movement for this patient on this admission is now 11/27/2012.   (RN charting this information here and not in the doc flow sheets because for some reason I could not add this information after transferring the patient to 4W)

## 2012-11-27 NOTE — Progress Notes (Signed)
Patient was accepted by inpatient rehab. Clinical Social Worker will sign off for now as social work intervention is no longer needed. Please consult [place] again if new need arises.   Sabino Niemann, MSW, [phone]

## 2012-11-27 NOTE — Progress Notes (Signed)
Pt & wife agreeable to CIR-bed available-will admit today to CIR. [phone]

## 2012-11-27 NOTE — Progress Notes (Signed)
Pt transferred to Rehab from 4N. Orientated to unit regarding rehab expectation. therapy schedule, visiting hours,  Team conference, etc. Diagnostic specific handout provided. All questions answered. Pt verbalized understanding. Pt states that he has been bedridden for a considerable amount of time. Rehab wish is to 'become more active.'

## 2012-11-27 NOTE — H&P (Signed)
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  HPI: Jeremy [name] is a 62 y.o. male with history of DM-diet controlled, peripheral neuropathy, COPD-O2 dependent, left leg ulcer X 20 years with recent worsening. Patient with I and D abscess 07/18 as well as IV antibiotics for MRSA/pseudomonas infection but continued to have poor healing with substantial pain affecting QOL. 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 Lajoyce Corners. Post op therapies initiated and patient with good participation and able to weight bear and take a couple of hopping steps. Therapy team recommended CIR and patient admitted today.   States he's been coughing. No productive cough. Wife has had some type of upper respiratory problems Review of Systems  Constitutional: Negative for fever and chills.  HENT: Negative for hearing loss.  Eyes: Negative for blurred vision and double vision.  Respiratory: Positive for cough. Negative for shortness of breath and wheezing.  Cardiovascular: Negative for chest pain and palpitations.  Gastrointestinal: Negative for heartburn, nausea and constipation.  Genitourinary: Negative for urgency and frequency.  Musculoskeletal: Positive for myalgias. Negative for back pain.  Neurological: Positive for weakness. Negative for dizziness and headaches.  Psychiatric/Behavioral: The patient has insomnia (didn't sleep well last night/tired.).   Past Medical History   Diagnosis  Date   .  GERD (gastroesophageal reflux disease)    .  Asthma    .  Shortness of breath    .  Leg pain    .  Peripheral vascular disease    .  Sleep apnea      uses bipap, sleep study done in [place] 2 years ago, dr Cala Bradford byrd   .  Anemia    .  Headache(784.0)      sinus headaches   .  COPD (chronic obstructive pulmonary disease)      3L continuous   .  Peripheral neuropathy    .  Cancer      hx basal cell skin cancer   .  Psoriasis    .  DVT (deep venous thrombosis)      LLE DVT '80's   .  CHF  (congestive heart failure)      does not see a cardiologist   .  Diabetes mellitus      not on medication    Past Surgical History   Procedure  Laterality  Date   .  Arm surgery     .  Vein ligation and stripping     .  Tummy tuck     .  Stomach stapling     .  Skin grafts     .  I&d extremity   03/13/2011     Procedure: IRRIGATION AND DEBRIDEMENT EXTREMITY; Surgeon: Nadara Mustard, MD; Location: [facility] OR; Service: Orthopedics; Laterality: Left;   .  Skin split graft   03/13/2012     Procedure: SKIN GRAFT SPLIT THICKNESS; Surgeon: Nadara Mustard, MD; Location: [facility] OR; Service: Orthopedics; Laterality: Left; Excisional Debridement Left Leg, Split Thickness Skin Graft, Wound VAC   .  Back stimulator     .  Apligraft placement  Left  07/31/2012     Procedure: Apply Skin Graft; Surgeon: Nadara Mustard, MD; Location: [facility]; Service: Orthopedics; Laterality: Left;   .  Application of wound vac  Left  07/31/2012     Procedure: APPLICATION OF WOUND VAC; Surgeon: Nadara Mustard, MD; Location: [facility] OR; Service: Orthopedics; Laterality: Left;   .  Cardiac catheterization  Hx: of " around 1985 at [facility]"   .  Cyst excision       Hx: of   .  I&d extremity  Left  11/06/2012     Procedure: IRRIGATION AND DEBRIDEMENT EXTREMITY-Left lower ; Surgeon: Nadara Mustard, MD; Location: [facility] OR; Service: Orthopedics; Laterality: Left; Left Leg Irrigation and Debridement, Place Skin Graft, Wound VAC, [place]   .  Amputation  Left  11/25/2012     Procedure: AMPUTATION BELOW KNEE; Surgeon: Nadara Mustard, MD; Location: [facility] OR; Service: Orthopedics; Laterality: Left; Left Below Knee Amputation    Family History   Problem  Relation  Age of Onset   .  Heart disease  Brother     Social History: Married. Disabled Corporate treasurer. Has been sedentary past 2 years due to weight bearing restrictions on LLE. He reports that he quit smoking about 21 years ago. His smoking use included Cigarettes. He had a 3 pack-year smoking  history. He has never used smokeless tobacco. He reports that drinks alcohol on rare occasions.  Allergies: No Known Allergies  Medications Prior to Admission   Medication  Sig  Dispense  Refill   .  albuterol (PROVENTIL) (2.5 MG/3ML) 0.083% nebulizer solution  Take 2.5 mg by nebulization daily as needed for wheezing or shortness of breath.     .  DULoxetine (CYMBALTA) 30 MG capsule  Take 30 mg by mouth daily.     .  ferrous sulfate 325 (65 FE) MG tablet  Take 325 mg by mouth 3 (three) times daily with meals.     .  Fluticasone-Salmeterol (ADVAIR) 250-50 MCG/DOSE AEPB  Inhale 1 puff into the lungs daily.     .  furosemide (LASIX) 40 MG tablet  Take 40 mg by mouth daily.     [name]  levalbuterol (XOPENEX HFA) 45 MCG/ACT inhaler  Inhale 2 puffs into the lungs daily as needed for wheezing or shortness of breath.     .  montelukast (SINGULAIR) 10 MG tablet  Take 10 mg by mouth at bedtime.     [name]  oxyCODONE (OXY IR/ROXICODONE) 5 MG immediate release tablet  Take 1 tablet (5 mg total) by mouth 5 (five) times daily as needed for pain.  30 tablet  0   .  OxyCODONE (OXYCONTIN) 15 mg T12A 12 hr tablet  Take 1 tablet (15 mg total) by mouth every 12 (twelve) hours.  30 tablet  0   .  potassium chloride SA (K-DUR,KLOR-CON) 20 MEQ tablet  Take 20 mEq by mouth daily.     .  pravastatin (PRAVACHOL) 10 MG tablet  Take 10 mg by mouth at bedtime.     .  topiramate (TOPAMAX) 100 MG tablet  Take 100 mg by mouth 2 (two) times daily. Also takes 50 mg daily at noon     .  vancomycin (VANCOCIN) 1 GM/200ML SOLN  Inject 200 mLs (1,000 mg total) into the vein every 12 (twelve) hours.  4000 mL  0   .  Vitamin D, Ergocalciferol, (DRISDOL) 50000 UNITS CAPS  Take 50,000 Units by mouth every 7 (seven) days. Sunday     .  warfarin (COUMADIN) 5 MG tablet  Take 7.5 mg by mouth daily.     .  [DISCONTINUED] dextrose 5 % SOLN 50 mL with ceFEPIme 2 G SOLR 2 g  Inject 2 g into the vein every 12 (twelve) hours.  8 Package  0   .  fentaNYL  (DURAGESIC - DOSED MCG/HR) 25  MCG/HR  Place 1 patch (25 mcg total) onto the skin every 3 (three) days.  5 patch  0    Home:  Home Living  Family/patient expects to be discharged to:: Private residence  Living Arrangements: Spouse/significant other  Available Help at Discharge: Family;Available PRN/intermittently  Type of Home: House  Home Access: Level entry  Home Layout: One level  Home Equipment: None  Functional History:  Prior Function  Comments: on 3LO2 at home  Functional Status:  Mobility:  Bed Mobility  Bed Mobility: Supine to Sit;Sitting - Scoot to Edge of Bed  Supine to Sit: 5: Supervision;HOB flat;With rails  Sitting - Scoot to Edge of Bed: 5: Supervision  Sit to Supine: Not Tested (comment)  Transfers  Transfers: Sit to Stand;Stand to Sit  Sit to Stand: 1: +2 Total assist;With upper extremity assist;From bed  Sit to Stand: Patient Percentage: 70%  Stand to Sit: 1: +2 Total assist;With armrests;To chair/3-in-1  Stand to Sit: Patient Percentage: 70%  Ambulation/Gait  Ambulation/Gait Assistance: 1: +2 Total assist  Ambulation/Gait: Patient Percentage: 70%  Ambulation Distance (Feet): 5 Feet  Assistive device: Rolling walker  Ambulation/Gait Assistance Details: Pt. able to accept weight on UEs today for taking 5 steps with RW.  Gait Pattern: Step-to pattern (single leg hop)  Gait velocity: decreased   ADL:  ADL  Eating/Feeding: Performed;Independent  Where Assessed - Eating/Feeding: Chair  Grooming: Performed;Wash/dry hands;Wash/dry face;Teeth care;Set up  Where Assessed - Grooming: Supported sitting  Upper Body Bathing: Simulated;Set up  Where Assessed - Upper Body Bathing: Supported sitting  Lower Body Bathing: Simulated;Minimal assistance  Where Assessed - Lower Body Bathing: Supported sit to stand  Upper Body Dressing: Simulated;Set up  Where Assessed - Upper Body Dressing: Supported sitting  Lower Body Dressing: Performed;Moderate assistance  Where  Assessed - Lower Body Dressing: Supported sit to Scientist, research (life sciences): Performed;Moderate assistance  Toilet Transfer Method: Stand pivot  Toilet Transfer Equipment: Bedside commode  Equipment Used: Gait belt;Rolling walker  Transfers/Ambulation Related to ADLs: Pt walked appx 7 steps in room with walker and total A x2 (pt 70%).  ADL Comments: pt does well with adls. Sitting balance good enought to donn and doff sock w/o assist. More assist needed when standing due to decreased balance and endurance in standing.  Cognition:  Cognition  Overall Cognitive Status: Within Functional Limits for tasks assessed  Orientation Level: Oriented X4  Cognition  Arousal/Alertness: Awake/alert  Behavior During Therapy: WFL for tasks assessed/performed  Overall Cognitive Status: Within Functional Limits for tasks assessed  Physical Exam:  Blood pressure 130/59, pulse 72, temperature 98.6 F (37 C), temperature source Oral, resp. rate 16, height 5\' 10"  (1.778 m), weight 122.471 kg (270 lb), SpO2 98.00%.  Physical Exam  Constitutional: He is oriented to person, place, and time. He appears well-developed and well-nourished.  Obese male with nasal cannula in place.  HENT:  Head: Normocephalic and atraumatic.  Eyes: Conjunctivae are normal. Pupils are equal, round, and reactive to light.  Neck: Normal range of motion. Neck supple.  Cardiovascular: Normal rate, regular rhythm and normal heart sounds.  Pulmonary/Chest: Effort normal and breath sounds normal. No respiratory distress. He has no wheezes. He exhibits no tenderness.  Abdominal: Soft. Bowel sounds are normal. He exhibits no distension. There is no tenderness.  Quarter size abraded area above the umbilicus with evidence of bloody seeping-- (chronic ulcer due to belt buckle injury and chronic"picking" of wound)  Musculoskeletal: He exhibits no edema.  RLE with stasis changes and  some bogginess at heel.  Neurological: He is alert and oriented to  person, place, and time.  Speech clear. Follows commands without difficulty.  Skin: Skin is warm and dry.  Large psoriatic patches right knee and left elbow flexors area.  motor strength 4/5 bilateral deltoid, bicep, tricep, grip. 3 minus left hip flexor 4 minus right hip flexor knee extensor ankle dorsiflexor and plantar flexor.  Sensation reduced to light touch in the right foot. Intact light touch in both hands  Right foot and both hands have evidence of intrinsic muscle atrophy   Results for orders placed during the hospital encounter of 11/25/12 (from the past 48 hour(s))   PREPARE RBC (CROSSMATCH) Status: None    Collection Time    11/25/12 12:26 PM   Result  Value  Range    Order Confirmation  ORDER PROCESSED BY BLOOD BANK    PROTIME-INR Status: Abnormal    Collection Time    11/26/12 5:07 AM   Result  Value  Range    Prothrombin Time  23.5 (*)  11.6 - 15.2 seconds    INR  2.17 (*)  0.00 - 1.49   CBC WITH DIFFERENTIAL Status: Abnormal    Collection Time    11/26/12 5:07 AM   Result  Value  Range    WBC  10.7 (*)  4.0 - 10.5 K/uL    RBC  3.25 (*)  4.22 - 5.81 MIL/uL    Hemoglobin  10.3 (*)  13.0 - 17.0 g/dL    HCT  16.1 (*)  09.6 - 52.0 %    MCV  93.5  78.0 - 100.0 fL    MCH  31.7  26.0 - 34.0 pg    MCHC  33.9  30.0 - 36.0 g/dL    RDW  04.5  40.9 - 81.1 %    Platelets  149 (*)  150 - 400 K/uL    Comment:  SPECIMEN CHECKED FOR CLOTS     REPEATED TO VERIFY    Neutrophils Relative %  72  43 - 77 %    Neutro Abs  7.6  1.7 - 7.7 K/uL    Lymphocytes Relative  10 (*)  12 - 46 %    Lymphs Abs  1.1  0.7 - 4.0 K/uL    Monocytes Relative  15 (*)  3 - 12 %    Monocytes Absolute  1.6 (*)  0.1 - 1.0 K/uL    Eosinophils Relative  3  0 - 5 %    Eosinophils Absolute  0.3  0.0 - 0.7 K/uL    Basophils Relative  1  0 - 1 %    Basophils Absolute  0.1  0.0 - 0.1 K/uL   PROTIME-INR Status: Abnormal    Collection Time    11/27/12 5:45 AM   Result  Value  Range    Prothrombin Time   23.1 (*)  11.6 - 15.2 seconds    INR  2.12 (*)  0.00 - 1.49   CBC WITH DIFFERENTIAL Status: Abnormal    Collection Time    11/27/12 5:45 AM   Result  Value  Range    WBC  8.6  4.0 - 10.5 K/uL    RBC  3.15 (*)  4.22 - 5.81 MIL/uL    Hemoglobin  9.8 (*)  13.0 - 17.0 g/dL    HCT  91.4 (*)  78.2 - 52.0 %    MCV  94.3  78.0 - 100.0 fL  MCH  31.1  26.0 - 34.0 pg    MCHC  33.0  30.0 - 36.0 g/dL    RDW  16.1  09.6 - 04.5 %    Platelets  136 (*)  150 - 400 K/uL    Neutrophils Relative %  65  43 - 77 %    Neutro Abs  5.5  1.7 - 7.7 K/uL    Lymphocytes Relative  16  12 - 46 %    Lymphs Abs  1.3  0.7 - 4.0 K/uL    Monocytes Relative  15 (*)  3 - 12 %    Monocytes Absolute  1.3 (*)  0.1 - 1.0 K/uL    Eosinophils Relative  4  0 - 5 %    Eosinophils Absolute  0.4  0.0 - 0.7 K/uL    Basophils Relative  0  0 - 1 %    Basophils Absolute  0.0  0.0 - 0.1 K/uL    No results found.  Post Admission Physician Evaluation:  1. Functional deficits secondary to Left BKA. 2. Patient is admitted to receive collaborative, interdisciplinary care between the physiatrist, rehab nursing staff, and therapy team. 3. Patient's level of medical complexity and substantial therapy needs in context of that medical necessity cannot be provided at a lesser intensity of care such as a SNF. 4. Patient has experienced substantial functional loss from his/her baseline which was documented above under the "Functional History" and "Functional Status" headings. Judging by the patient's diagnosis, physical exam, and functional history, the patient has potential for functional progress which will result in measurable gains while on inpatient rehab. These gains will be of substantial and practical use upon discharge in facilitating mobility and self-care at the household level. 5. Physiatrist will provide 24 hour management of medical needs as well as oversight of the therapy plan/treatment and provide guidance as appropriate regarding  the interaction of the two. 6. 24 hour rehab nursing will assist with bladder management, bowel management, safety, skin/wound care, disease management, medication administration, pain management and patient education and help integrate therapy concepts, techniques,education, etc. 7. PT will assess and treat for/with: Pre-gait training, gait training, endurance, safety, equipment. Goals are: Supervision for mobility using a rolling walker. 8. OT will assess and treat for/with: ADLs, cognitive perceptual skills, safety, endurance, equipment. Goals are: Supervision ADLs. 9. SLP will assess and treat for/with: Not applicable. Goals are: Not applicable. 10. Case Management and Social Worker will assess and treat for psychological issues and discharge planning. 11. Team conference will be held weekly to assess progress toward goals and to determine barriers to discharge. 12. Patient will receive at least 3 hours of therapy per day at least 5 days per week. 13. ELOS: 7-10 days  14. Prognosis: good Medical Problem List and Plan:  1. DVT Prophylaxis/Anticoagulation: Pharmaceutical: Coumadin  2. Chronic Pain Management: Has spinal cord stimulator. Continue Fentanyl patch, OxyContin, Oxycodone, Topamax and Cymbalta.  3. Mood: Has realist goals and reasonable outlook overall. Will have LCSW follow for evaluation.  4. Neuropsych: This patient is capable of making decisions on his own behalf.  5. ABLA on chronic anemia: Will continue iron supplement. Recheck labs on Monday.  6. O2 dependent/OSA: Encouraged to wear BIPAP at nights to help with sleep hygiene as well as energy levels. Wife to bring in settings--will resume and have RT help titrate. ++Wife in room with chills/ has had respiratory infection (encouraged her to go home to prevent passage to patient).  7. Morbid  Obesity: s/p stomach stapling. Used to weigh 450+ lbs. Pressure relief measures. Will consult dietician to assist with nutritional needs.  8.  DM type 2: diet controlled. Will monitor with ac/hs checks and use SSI for tighter BS management post op to help with wound healing.  9. One Coag negative +BC 7/28: not staph and therefore does not need any more antibiotics per input from pharm.  10. Hypokalemia: Increase supplement to bid and recheck on Monday.  11. COPD: continue Singulair and Dulera. Monitor respiratory status with increase in activity.   Erick Colace M.D. [facility] FAAPM&R (Sports Med, Neuromuscular Med) Diplomate Am Board of Electrodiagnostic Med Diplomate Am Board of Pain Medicine Fellow Am Board of Interventional Pain Physicians  11/27/2012

## 2012-11-27 NOTE — Progress Notes (Signed)
Patient ID: Jeremy [name], male   DOB: June 24, 1950, 62 y.o.   MRN: [id number] Postoperative day 2 left transtibial amputation. Patient will need either inpatient rehabilitation or skilled nursing placement. As of today patient still does not have an F. L2 on the chart and patient states that no one has spoken to him about skilled nursing discharge.  Orders are written for discharge today if available.

## 2012-11-27 NOTE — Discharge Summary (Signed)
Physician Discharge Summary  Patient ID: Jeremy [name] MRN: [id number] DOB/AGE: 61-Sep-1952 61 y.o.  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 *   Discharged Condition: stable  Hospital Course: Patient's hospital course was essentially unremarkable. He underwent a transtibial amputation of note patient had such extreme calcification of the deep fascial layer that it was very difficult to even cut with a 21 blade knife.  Consults: None  Significant Diagnostic Studies: labs: Routine labs  Treatments: surgery: See operative note  Discharge Exam: Blood pressure 130/59, pulse 72, temperature 98.6 F (37 C), temperature source Oral, resp. rate 16, height 5\' 10"  (1.778 m), weight 122.471 kg (270 lb), SpO2 98.00%. Incision/Wound: dressing clean dry and intact  Disposition: 06-Home-Health Care Svc  Discharge Orders   Future Orders Complete By Expires     Call MD / Call 911  As directed     Comments:      If you experience chest pain or shortness of breath, CALL 911 and be transported to the hospital emergency room.  If you develope a fever above 101 F, pus (white drainage) or increased drainage or redness at the wound, or calf pain, call your surgeon's office.    Constipation Prevention  As directed     Comments:      Drink plenty of fluids.  Prune juice may be helpful.  You may use a stool softener, such as Colace (over the counter) 100 mg twice a day.  Use MiraLax (over the counter) for constipation as needed.    Diet - low sodium heart healthy  As directed     Increase activity slowly as tolerated  As directed         Medication List         albuterol (2.5 MG/3ML) 0.083% nebulizer solution  Commonly known as:  PROVENTIL  Take 2.5 mg by nebulization daily as needed for wheezing  or shortness of breath.     dextrose 5 % SOLN 50 mL with ceFEPIme 2 G SOLR 2 g  Inject 2 g into the vein every 12 (twelve) hours.     DULoxetine 30 MG capsule  Commonly known as:  CYMBALTA  Take 30 mg by mouth daily.     fentaNYL 25 MCG/HR patch  Commonly known as:  DURAGESIC - dosed mcg/hr  Place 1 patch (25 mcg total) onto the skin every 3 (three) days.     ferrous sulfate 325 (65 FE) MG tablet  Take 325 mg by mouth 3 (three) times daily with meals.     Fluticasone-Salmeterol 250-50 MCG/DOSE Aepb  Commonly known as:  ADVAIR  Inhale 1 puff into the lungs daily.     furosemide 40 MG tablet  Commonly known as:  LASIX  Take 40 mg by mouth daily.     montelukast 10 MG tablet  Commonly known as:  SINGULAIR  Take 10 mg by mouth at bedtime.     OxyCODONE 15 mg T12a 12 hr tablet  Commonly known as:  OXYCONTIN  Take 1 tablet (15 mg total) by mouth every 12 (twelve) hours.     oxyCODONE 5 MG immediate release tablet  Commonly known as:  Oxy IR/ROXICODONE  Take 1 tablet (5 mg total) by mouth 5 (five) times daily as needed for pain.     potassium chloride SA 20 MEQ tablet  Commonly known as:  K-DUR,KLOR-CON  Take 20 mEq by mouth daily.     pravastatin 10 MG tablet  Commonly known as:  PRAVACHOL  Take 10 mg by mouth at bedtime.     topiramate 100 MG tablet  Commonly known as:  TOPAMAX  Take 100 mg by mouth 2 (two) times daily. Also takes 50 mg daily at noon     vancomycin 1 GM/200ML Soln  Commonly known as:  VANCOCIN  Inject 200 mLs (1,000 mg total) into the vein every 12 (twelve) hours.     Vitamin D (Ergocalciferol) 50000 UNITS Caps capsule  Commonly known as:  DRISDOL  Take 50,000 Units by mouth every 7 (seven) days. Sunday     warfarin 5 MG tablet  Commonly known as:  COUMADIN  Take 7.5 mg by mouth daily.     XOPENEX HFA 45 MCG/ACT inhaler  Generic drug:  levalbuterol  Inhale 2 puffs into the lungs daily as needed for wheezing or shortness of breath.            Follow-up Information   Follow up with Tanza Pellot V, MD In 2 weeks.   Contact information:   [address] [place] [address] [phone]       Signed: Nadara Mustard 11/27/2012, 7:51 AM

## 2012-11-27 NOTE — Progress Notes (Signed)
INITIAL NUTRITION ASSESSMENT  DOCUMENTATION CODES Per approved criteria  -Morbid Obesity   INTERVENTION: 1.  Supplements; Prostat BID- mix with applesauce or 2 oz juice daily if desired by pt.   NUTRITION DIAGNOSIS: Inadequate oral intake related to poor appetite as evidenced by PO 50% of meals.   Monitor:  1.  Food/Beverage; pt meeting >/=90% estimated needs with tolerance. 2.  Wt/wt change; monitor trends  Reason for Assessment: consult  62 y.o. male  Admitting Dx: BKA  ASSESSMENT: Pt admitted to CIR s/p BKA. Pt with h/o morbid obesity.  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  Pt has had his stomach stapled.  Per chart review, pt highest wt is 340 lbs. 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 RD consulted to address nutrition needs. Currently on CHO Modified diet with ~50% of meals complete prior to transfer.  Pt recently admitted to unit, in with RN at time of visit.  RD will continue to follow and assess for adequacy of intake.  Will order protein supplement.  Height: Ht Readings from Last 1 Encounters:  11/23/12 5\' 10"  (1.778 m)    Weight: Wt Readings from Last 1 Encounters:  11/23/12 270 lb (122.471 kg)    Ideal Body Weight: 75.4 kg  % Ideal Body Weight: 162%  Wt Readings from Last 10 Encounters:  11/23/12 270 lb (122.471 kg)  11/23/12 270 lb (122.471 kg)  11/19/12 272 lb (123.378 kg)  11/06/12 272 lb 5 oz (123.52 kg)  11/06/12 272 lb 5 oz (123.52 kg)  08/03/12 280 lb (127.007 kg)  08/03/12 280 lb (127.007 kg)  07/29/12 278 lb 1.6 oz (126.145 kg)  03/16/12 256 lb 6.3 oz (116.3 kg)  03/16/12 256 lb 6.3 oz (116.3 kg)    Usual Body Weight: 250-280 lbs  % Usual Body Weight: 100%  Adjusted BMI: 41.1  Estimated Nutritional Needs: Kcal: 2000-2150 Protein: 110-130g Fluid: >2.0 L/day  Skin: incision   Diet Order: Carb Control  EDUCATION NEEDS: -Education needs addressed   Intake/Output Summary (Last 24 hours) at  11/27/12 1617 Last data filed at 11/27/12 1543  Gross per 24 hour  Intake      0 ml  Output    300 ml  Net   -300 ml    Last BM: 8/8  Labs:   Recent Labs Lab 11/25/12 0751  NA 140  K 3.3*  CL 103  CO2 25  BUN 21  CREATININE 1.09  CALCIUM 8.8  GLUCOSE 128*    CBG (last 3)   Recent Labs  11/25/12 1137  GLUCAP 118*    Scheduled Meds: . [START ON 11/28/2012] DULoxetine  30 mg Oral Daily  . [START ON 11/28/2012] fentaNYL  25 mcg Transdermal Q72H  . ferrous sulfate  325 mg Oral TID WC  . fluocinonide-emollient   Topical TID  . [START ON 11/28/2012] furosemide  40 mg Oral Daily  . mometasone-formoterol  2 puff Inhalation BID  . montelukast  10 mg Oral QHS  . OxyCODONE  15 mg Oral Q12H  . potassium chloride SA  20 mEq Oral BID  . simvastatin  5 mg Oral q1800  . topiramate  100 mg Oral BID    Continuous Infusions:   Past Medical History  Diagnosis Date  . GERD (gastroesophageal reflux disease)   . Asthma   . Shortness of breath   . Leg pain   . Peripheral vascular disease   . Sleep apnea     uses bipap, sleep study  done in [place] 2 years ago, dr Cala Bradford byrd  . Anemia   . Headache(784.0)     sinus headaches  . COPD (chronic obstructive pulmonary disease)     3L continuous  . Peripheral neuropathy   . Cancer     hx basal cell skin cancer  . Psoriasis   . 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 CHF (congestive heart failure)     does not see a cardiologist  . Diabetes mellitus     not on medication    Past Surgical History  Procedure Laterality Date  . Arm surgery    . Vein ligation and stripping    . Tummy tuck    . Stomach stapling    . Skin grafts    . I&d extremity  03/13/2011    Procedure: IRRIGATION AND DEBRIDEMENT EXTREMITY;  Surgeon: Nadara Mustard, MD;  Location: [facility] OR;  Service: Orthopedics;  Laterality: Left;  . Skin split graft  03/13/2012    Procedure: SKIN GRAFT SPLIT THICKNESS;  Surgeon: Nadara Mustard, MD;  Location: [facility]  OR;  Service: Orthopedics;  Laterality: Left;  Excisional Debridement Left Leg, Split Thickness Skin Graft, Wound VAC  . Back stimulator    . Apligraft placement Left 07/31/2012    Procedure: Apply Skin Graft;  Surgeon: Nadara Mustard, MD;  Location: [facility] OR;  Service: Orthopedics;  Laterality: Left;  . Application of wound vac Left 07/31/2012    Procedure: APPLICATION OF WOUND VAC;  Surgeon: Nadara Mustard, MD;  Location: [facility] OR;  Service: Orthopedics;  Laterality: Left;  . Cardiac catheterization      Hx: of " around 1985 at [facility]"  . Cyst excision      Hx: of  . I&d extremity Left 11/06/2012    Procedure: IRRIGATION AND DEBRIDEMENT EXTREMITY-Left lower ;  Surgeon: Nadara Mustard, MD;  Location: [facility] OR;  Service: Orthopedics;  Laterality: Left;  Left Leg Irrigation and Debridement, Place Skin Graft, Wound VAC, [place]  . Amputation Left 11/25/2012    Procedure: AMPUTATION BELOW KNEE;  Surgeon: Nadara Mustard, MD;  Location: [facility] OR;  Service: Orthopedics;  Laterality: Left;  Left Below Knee Amputation    Loyce Dys, MS RD LDN Clinical Inpatient Dietitian Pager: [phone] Weekend/After hours pager: [phone]

## 2012-11-27 NOTE — PMR Pre-admission (Signed)
PMR Admission Coordinator Pre-Admission Assessment  Patient: Jeremy [name] is an 62 y.o., male MRN: [id number] DOB: 17-Sep-1950 Height: 5\' 10"  (177.8 cm) Weight: 122.471 kg (270 lb)              Insurance Information HMO:      PPO:       PCP:       IPA:       80/20: yes     OTHER:   PRIMARY: Medicare       Policy#: [id number]      Subscriber: pt    Employer: Disabled Benefits:       Name: [employer] Date: 11-20-09 A & B     Deduct: $1216.00/year      Out of Pocket Max: 0      Life Max: 0 CIR: 100%      SNF: 100% days 1-20: $152.00/d days 21-100 Outpatient: 80%     Co-Pay: 20% Home Health: 100%      Co-Pay: 20% supplies used DME: 80%     Co-Pay: 20% Providers: pt's choice SECONDARY: [employer] [place]      Policy#: [id number]      Subscriber: pt     Emergency Contact Information Contact Information   Name Relation Home Work Mobile   Ephraim Spouse [phone]      Current Medical History  Patient Admitting Diagnosis:  Left BKA   History of Present Illness:   62 y.o. male with history of DM-diet controlled, peripheral neuropathy, COPD-O2 dependent, left leg ulcer X 20 years with recent worsening. Patient with I and D abscess 07/18 as well as IV antibiotics for MRSA/pseudomonas infection but continued to have poor healing with substantial pain affecting QOL. 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 Lajoyce Corners.          Past Medical History  Past Medical History  Diagnosis Date  . GERD (gastroesophageal reflux disease)   . Asthma   . Shortness of breath   . Leg pain   . Peripheral vascular disease   . Sleep apnea     uses bipap, sleep study done in [place] 2 years ago, dr Cala Bradford byrd  . Anemia   . Headache(784.0)     sinus headaches  . COPD (chronic obstructive pulmonary disease)     3L continuous  . Peripheral neuropathy   . Cancer     hx basal cell skin cancer  . Psoriasis   . 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 CHF  (congestive heart failure)     does not see a cardiologist  . Diabetes mellitus     not on medication    Family History  family history includes Heart disease in his brother.  Prior Rehab/Hospitalizations: no   Current Medications  Current facility-administered medications:0.9 %  sodium chloride infusion, , Intravenous, Continuous, Nadara Mustard, MD, Last Rate: 20 mL/hr at 11/27/12 0531, 20 mL/hr at 11/27/12 0531;  albuterol (PROVENTIL) (5 MG/ML) 0.5% nebulizer solution 2.5 mg, 2.5 mg, Nebulization, Q4H PRN, Nadara Mustard, MD;  DULoxetine (CYMBALTA) DR capsule 30 mg, 30 mg, Oral, Daily, Nadara Mustard, MD, 30 mg at 11/27/12 1027 fentaNYL (DURAGESIC - dosed mcg/hr) patch 25 mcg, 25 mcg, Transdermal, Q72H, Nadara Mustard, MD, 25 mcg at 11/25/12 1720;  ferrous sulfate tablet 325 mg, 325 mg, Oral, TID WC, Nadara Mustard, MD, 325 mg at 11/27/12 1027;  furosemide (LASIX) tablet 40 mg, 40  mg, Oral, Daily, Nadara Mustard, MD, 40 mg at 11/27/12 1027;  HYDROmorphone (DILAUDID) injection 1 mg, 1 mg, Intravenous, Q2H PRN, Nadara Mustard, MD, 1 mg at 11/27/12 1144 levalbuterol [facility]) inhaler 2 puff, 2 puff, Inhalation, Daily PRN, Nadara Mustard, MD;  metoCLOPramide (REGLAN) injection 5-10 mg, 5-10 mg, Intravenous, Q8H PRN, Nadara Mustard, MD, 10 mg at 11/26/12 0035;  metoCLOPramide (REGLAN) tablet 5-10 mg, 5-10 mg, Oral, Q8H PRN, Nadara Mustard, MD;  mometasone-formoterol [facility]) 100-5 MCG/ACT inhaler 2 puff, 2 puff, Inhalation, BID, Nadara Mustard, MD, 2 puff at 11/27/12 0750 montelukast (SINGULAIR) tablet 10 mg, 10 mg, Oral, QHS, Nadara Mustard, MD, 10 mg at 11/26/12 2201;  ondansetron (ZOFRAN) injection 4 mg, 4 mg, Intravenous, Q6H PRN, Nadara Mustard, MD;  ondansetron [facility]) tablet 4 mg, 4 mg, Oral, Q6H PRN, Nadara Mustard, MD;  oxyCODONE (Oxy IR/ROXICODONE) immediate release tablet 5 mg, 5 mg, Oral, 5 X Daily PRN, Nadara Mustard, MD, 5 mg at 11/27/12 1026 OxyCODONE (OXYCONTIN) 12 hr tablet 15 mg, 15 mg, Oral,  Q12H, Nadara Mustard, MD, 15 mg at 11/27/12 1026;  potassium chloride SA (K-DUR,KLOR-CON) CR tablet 20 mEq, 20 mEq, Oral, Daily, Nadara Mustard, MD, 20 mEq at 11/26/12 1015;  simvastatin (ZOCOR) tablet 5 mg, 5 mg, Oral, q1800, Nadara Mustard, MD, 5 mg at 11/26/12 1701;  topiramate (TOPAMAX) tablet 100 mg, 100 mg, Oral, BID, Nadara Mustard, MD, 100 mg at 11/27/12 1027 warfarin (COUMADIN) tablet 7.5 mg, 7.5 mg, Oral, q1800, Nadara Mustard, MD, 7.5 mg at 11/26/12 1701;  Warfarin - Pharmacist Dosing Inpatient, , Does not apply, q1800, Nadara Mustard, MD  Patients Current Diet: Carb Control low Na+  Precautions / Restrictions Precautions Precautions: Fall Precaution Comments: chronic O2 Restrictions Weight Bearing Restrictions: Yes LLE Weight Bearing: Non weight bearing   Prior Activity Level Limited Community (1-2x/wk): occ outing to MD office or family event (he did drive occ per wife) Wife reports pt mostly sitting or lying w/ L. 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 Assistive Devices/Equipment: None Home Equipment: None  Prior Functional Level Prior Function Level of Independence: Independent Comments: on 3LO2 at home  Current Functional Level Cognition  Overall Cognitive Status: Within Functional Limits for tasks assessed Orientation Level: Oriented X4    Extremity Assessment (includes Sensation/Coordination)  Upper Extremity Assessment: Overall WFL for tasks assessed  Lower Extremity Assessment: Defer to PT evaluation    ADLs  Eating/Feeding: Performed;Independent Where Assessed - Eating/Feeding: Chair Grooming: Performed;Wash/dry hands;Wash/dry face;Teeth care;Set up Where Assessed - Grooming: Supported sitting Upper Body Bathing: Simulated;Set up Where Assessed - Upper Body Bathing: Supported sitting Lower Body Bathing: Simulated;Minimal assistance Where Assessed - Lower Body Bathing: Supported sit to stand Upper Body Dressing: Simulated;Set  up Where Assessed - Upper Body Dressing: Supported sitting Lower Body Dressing: Performed;Moderate assistance Where Assessed - Lower Body Dressing: Supported sit to Pharmacist, hospital: Performed;Moderate assistance Toilet Transfer Method: Stand pivot Toilet Transfer Equipment: Bedside commode Toileting - Clothing Manipulation and Hygiene: Simulated;Moderate assistance Where Assessed - Toileting Clothing Manipulation and Hygiene: Standing Equipment Used: Gait belt;Rolling walker Transfers/Ambulation Related to ADLs: Pt walked appx 7 steps in room with walker and total A x2 (pt 70%). ADL Comments: pt does well with adls. Sitting balance good enought to donn and doff sock w/o assist.  More assist needed when standing due to decreased balance and endurance in standing.    Mobility  Bed Mobility: Supine to Sit;Sitting - Scoot to Edge of Bed Supine to Sit: 5: Supervision;HOB flat;With rails Sitting - Scoot to Edge of Bed: 5: Supervision Sit to Supine: Not Tested (comment)    Transfers  Transfers: Sit to Stand;Stand to Sit Sit to Stand: 1: +2 Total assist;With upper extremity assist;From bed Sit to Stand: Patient Percentage: 70% Stand to Sit: 1: +2 Total assist;With armrests;To chair/3-in-1 Stand to Sit: Patient Percentage: 70%    Ambulation / Gait / Stairs / Wheelchair Mobility  Ambulation/Gait Ambulation/Gait Assistance: 1: +2 Total assist Ambulation/Gait: Patient Percentage: 70% Ambulation Distance (Feet): 5 Feet Assistive device: Rolling walker Ambulation/Gait Assistance Details: Pt. able to accept weight on UEs today for taking 5 steps with RW.   Gait Pattern: Step-to pattern (single leg hop) Gait velocity: decreased    Posture / Balance Static Sitting Balance Static Sitting - Balance Support: No upper extremity supported;Feet supported Static Sitting - Level of Assistance: 7: Independent Static Sitting - Comment/# of Minutes: 3 Static Standing Balance Static Standing -  Balance Support: Bilateral upper extremity supported;During functional activity Static Standing - Level of Assistance: 4: Min assist    Special needs/care consideration  Oxygen: chronic @ 3L Skin: no problems noted  Healing L. BKA Bowel mgmt: LBM 11/24/12 PTA Bladder mgmt: continent Diabetic mgmt:  yes     Previous Home Environment Living Arrangements: Spouse/significant other Available Help at Discharge: Family;Available PRN/intermittently Type of Home: House Home Layout: One level Home Access: Level entry Bathroom Shower/Tub: Engineer, manufacturing systems: Standard Bathroom Accessibility:  (wife unsure if walker will fit in bathroom)  Discharge Living Setting Plans for Discharge Living Setting: Patient's home  Social/Family/Support Systems Patient Roles: Spouse Contact Information: [phone] Anticipated Caregiver: wife, Britta Mccreedy Anticipated Caregiver's Contact Information: [phone] Ability/Limitations of Caregiver: Min A Caregiver Availability: 24/7 (initially-then intermittent) Discharge Plan Discussed with Primary Caregiver: Yes Is Caregiver In Agreement with Plan?: Yes Does Caregiver/Family have Issues with Lodging/Transportation while Pt is in Rehab?: No    Goals/Additional Needs Patient/Family Goal for Rehab: Mod I Expected length of stay: @ 10 days Pt/Family Agrees to Admission and willing to participate: Yes Program Orientation Provided & Reviewed with Pt/Caregiver Including Roles  & Responsibilities: Yes   Decrease burden of Care through IP rehab admission: Specialzed equipment needs, Decrease number of caregivers and Patient/family education   Possible need for SNF placement upon discharge: not expected   Patient Condition: This patient's condition remains as documented in the consult dated 11/26/12, in which the Rehabilitation Physician determined and documented that the patient's condition is appropriate for intensive rehabilitative care  in an inpatient rehabilitation facility. Will admit to inpatient rehab today.  Preadmission Screen Completed By:  Brock Ra, 11/27/2012 1:05 PM ______________________________________________________________________   Discussed status with Dr. Wynn Banker on 11/27/12 at 1:05pm and received telephone approval for admission today.  Admission Coordinator:  Brock Ra, time 1:05pm/Date8/8/14

## 2012-11-27 NOTE — Progress Notes (Signed)
Report called to [facility] on Inpateint Rehab. All questions answered. Medications taken with patient to new room and handed to front desk.

## 2012-11-27 NOTE — Progress Notes (Signed)
ANTICOAGULATION CONSULT NOTE - Follow Up Consult  Pharmacy Consult for Coumadin Indication: PVD, Hx DVTs  No Known Allergies  Patient Measurements:   Heparin Dosing Weight:   Vital Signs: Temp: 97.8 F (36.6 C) (08/08 1610) Temp src: Oral (08/08 1610) BP: 102/64 mmHg (08/08 1610) Pulse Rate: 59 (08/08 1610)  Labs:  Recent Labs  11/25/12 0751 11/25/12 0900 11/26/12 0507 11/27/12 0545  HGB 12.2*  --  10.3* 9.8*  HCT 36.0*  --  30.4* 29.7*  PLT 211  --  149* 136*  APTT  --  43*  --   --   LABPROT  --  21.3* 23.5* 23.1*  INR  --  1.91* 2.17* 2.12*  CREATININE 1.09  --   --   --     The CrCl is unknown because both a height and weight (above a minimum accepted value) are required for this calculation.   Medications:  Scheduled:  . [START ON 11/28/2012] DULoxetine  30 mg Oral Daily  . [START ON 11/28/2012] fentaNYL  25 mcg Transdermal Q72H  . ferrous sulfate  325 mg Oral TID WC  . fluocinonide-emollient   Topical TID  . [START ON 11/28/2012] furosemide  40 mg Oral Daily  . mometasone-formoterol  2 puff Inhalation BID  . montelukast  10 mg Oral QHS  . OxyCODONE  15 mg Oral Q12H  . potassium chloride SA  20 mEq Oral BID  . simvastatin  5 mg Oral q1800  . topiramate  100 mg Oral BID    Assessment: 62yo male with PVD and Hx DVTs, on home dose of Coumadin 7.5mg  daily.  INR therapeutic this AM at 2.12.  No bleeding problems noted s/p transtibial amputation on 11/25/12.  Hgb 9.8, PLTC 136K. Transferred to rehab unit (CIR) today.   Goal of Therapy:  INR 2-3 Monitor platelets by anticoagulation protocol: Yes   Plan:  1.  Continue Coumadin 7.5mg  daily 2.  Monitor INR in AM with plan to change to MWF INR remains therapeutic in AM.   [name], RPh Clinical Pharmacist Pager: [phone]

## 2012-11-28 ENCOUNTER — Inpatient Hospital Stay (HOSPITAL_COMMUNITY): Payer: Medicare Other | Admitting: Physical Therapy

## 2012-11-28 ENCOUNTER — Inpatient Hospital Stay (HOSPITAL_COMMUNITY): Payer: Medicare Other | Admitting: Occupational Therapy

## 2012-11-28 DIAGNOSIS — L98499 Non-pressure chronic ulcer of skin of other sites with unspecified severity: Secondary | ICD-10-CM

## 2012-11-28 DIAGNOSIS — S88119A Complete traumatic amputation at level between knee and ankle, unspecified lower leg, initial encounter: Secondary | ICD-10-CM

## 2012-11-28 DIAGNOSIS — I739 Peripheral vascular disease, unspecified: Secondary | ICD-10-CM

## 2012-11-28 LAB — PROTIME-INR
INR: 1.82 — ABNORMAL HIGH (ref 0.00–1.49)
Prothrombin Time: 20.5 seconds — ABNORMAL HIGH (ref 11.6–15.2)

## 2012-11-28 MED ORDER — WARFARIN SODIUM 10 MG PO TABS
10.0000 mg | ORAL_TABLET | Freq: Once | ORAL | Status: AC
  Administered 2012-11-28: 10 mg via ORAL
  Filled 2012-11-28: qty 1

## 2012-11-28 NOTE — Progress Notes (Signed)
Patient ID: MAISON KESTENBAUM, male   DOB: 1950-06-26, 62 y.o.   MRN: [id number] Subjective/Complaints: 62 y.o. male with history of DM-diet controlled, peripheral neuropathy, COPD-O2 dependent, left leg ulcer X 20 years with recent worsening. Patient with I and D abscess 07/18 as well as IV antibiotics for MRSA/pseudomonas infection but continued to have poor healing with substantial pain affecting QOL. 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 Lajoyce Corners.  Slept okay. Started OT this morning Not using PICC   Review of Systems  Musculoskeletal: Positive for joint pain.  Neurological: Positive for sensory change.  All other systems reviewed and are negative.   Objective: Vital Signs: Blood pressure 119/70, pulse 63, temperature 98.2 F (36.8 C), temperature source Oral, resp. rate 19, height 5\' 10"  (1.778 m), weight 122.471 kg (270 lb), SpO2 99.00%. No results found. Results for orders placed during the hospital encounter of 11/27/12 (from the past 72 hour(s))  PROTIME-INR     Status: Abnormal   Collection Time    11/28/12  5:30 AM      Result Value Range   Prothrombin Time 20.5 (*) 11.6 - 15.2 seconds   INR 1.82 (*) 0.00 - 1.49        Assessment/Plan: 1. Functional deficits secondary to left BKA which require 3+ hours per day of interdisciplinary therapy in a comprehensive inpatient rehab setting. Physiatrist is providing close team supervision and 24 hour management of active medical problems listed below. Physiatrist and rehab team continue to assess barriers to discharge/monitor patient progress toward functional and medical goals. FIM:                   Comprehension Comprehension Mode: Auditory Comprehension: 5-Follows basic conversation/direction: With no assist  Expression Expression Mode: Verbal Expression: 5-Expresses basic needs/ideas: With no assist  Social Interaction Social Interaction: 5-Interacts appropriately 90% of the time - Needs monitoring or  encouragement for participation or interaction.  Problem Solving Problem Solving: 5-Solves basic 90% of the time/requires cueing < 10% of the time  Memory Memory: 5-Recognizes or recalls 90% of the time/requires cueing < 10% of the time  Medical Problem List and Plan:  1. DVT Prophylaxis/Anticoagulation: Pharmaceutical: Coumadin  2. Chronic Pain Management: Has spinal cord stimulator. Continue Fentanyl patch, OxyContin, Oxycodone, Topamax and Cymbalta. As pain subsides may be able to adjust medications 3. Mood: Has realist goals and reasonable outlook overall. Will have LCSW follow for evaluation.  4. Neuropsych: This patient is capable of making decisions on his own behalf.  5. ABLA on chronic anemia: Will continue iron supplement. Recheck labs on Monday.  6. O2 dependent/OSA: Encouraged to wear BIPAP at nights to help with sleep hygiene as well as energy levels. Wife to bring in settings--will resume and have RT help titrate. ++Wife in room with chills/ has had respiratory infection (encouraged her to go home to prevent passage to patient).  7. Morbid Obesity: s/p stomach stapling. Used to weigh 450+ lbs. Pressure relief measures. Will consult dietician to assist with nutritional needs.  8. DM type 2: diet controlled. Will monitor with ac/hs checks and use SSI for tighter BS management post op to help with wound healing.  9. One Coag negative +BC 7/28: not staph and therefore does not need any more antibiotics per input from pharm.  10. Hypokalemia: Increase supplement to bid and recheck on Monday.  11. COPD: continue Singulair and Dulera. Monitor respiratory status with increase in activity  LOS (Days) 1 A FACE TO FACE EVALUATION  WAS PERFORMED  Cinnamon Morency E 11/28/2012, 11:01 AM

## 2012-11-28 NOTE — Evaluation (Signed)
Physical Therapy Assessment and Plan  Patient Details  Name: ESGAR BARNICK MRN: [id number] Date of Birth: 1950/09/20  PT Diagnosis: Difficulty walking, Muscle weakness and Pain in L LE (BKA) Rehab Potential: Good ELOS: 7-10 days   Today's Date: 11/28/2012 Time: 9147-8295 Time Calculation (min): 60 min  Problem List:  Patient Active Problem List   Diagnosis Date Noted  . Bacteremia 11/20/2012  . Cellulitis 11/17/2012  . DVT (deep venous thrombosis) 11/17/2012  . PVD (peripheral vascular disease) 11/17/2012  . COPD (chronic obstructive pulmonary disease) 11/17/2012  . Psoriasis 11/17/2012  . Chronic pain syndrome 11/17/2012  . Chronic anemia 11/17/2012  . Diabetes mellitus with peripheral vascular disease 11/17/2012  . Cellulitis of left lower leg 07/27/2011  . 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 GERD (gastroesophageal reflux disease)   . Asthma   . Shortness of breath   . Leg pain   . Peripheral vascular disease   . Sleep apnea     uses bipap, sleep study done in [place] 2 years ago, dr Cala Bradford byrd  . Anemia   . Headache(784.0)     sinus headaches  . COPD (chronic obstructive pulmonary disease)     3L continuous  . Peripheral neuropathy   . Cancer     hx basal cell skin cancer  . Psoriasis   . 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 CHF (congestive heart failure)     does not see a cardiologist  . Diabetes mellitus     not on medication   Past Surgical History:  Past Surgical History  Procedure Laterality Date  . Arm surgery    . Vein ligation and stripping    . Tummy tuck    . Stomach stapling    . Skin grafts    . I&d extremity  03/13/2011    Procedure: IRRIGATION AND DEBRIDEMENT EXTREMITY;  Surgeon: Nadara Mustard, MD;  Location: [facility] OR;  Service: Orthopedics;  Laterality: Left;  . Skin split graft  03/13/2012    Procedure: SKIN GRAFT  SPLIT THICKNESS;  Surgeon: Nadara Mustard, MD;  Location: [facility] OR;  Service: Orthopedics;  Laterality: Left;  Excisional Debridement Left Leg, Split Thickness Skin Graft, Wound VAC  . Back stimulator    . Apligraft placement Left 07/31/2012    Procedure: Apply Skin Graft;  Surgeon: Nadara Mustard, MD;  Location: [facility] OR;  Service: Orthopedics;  Laterality: Left;  . Application of wound vac Left 07/31/2012    Procedure: APPLICATION OF WOUND VAC;  Surgeon: Nadara Mustard, MD;  Location: [facility] OR;  Service: Orthopedics;  Laterality: Left;  . Cardiac catheterization      Hx: of " around 1985 at [facility]"  . Cyst excision      Hx: of  . I&d extremity Left 11/06/2012    Procedure: IRRIGATION AND DEBRIDEMENT EXTREMITY-Left lower ;  Surgeon: Nadara Mustard, MD;  Location: [facility] OR;  Service: Orthopedics;  Laterality: Left;  Left Leg Irrigation and Debridement, Place Skin Graft, Wound VAC, [place]in  . Amputation Left 11/25/2012    Procedure: AMPUTATION BELOW KNEE;  Surgeon: Nadara Mustard, MD;  Location: [facility] OR;  Service: Orthopedics;  Laterality: Left;  Left Below Knee Amputation    Assessment & Plan Clinical Impression: ABHISHEK LEVESQUE is a 62 y.o. male with history of DM-diet controlled, peripheral  neuropathy, COPD-O2 dependent, left leg ulcer X 20 years with recent worsening. Patient with I and D abscess 07/18 as well as IV antibiotics for MRSA/pseudomonas infection but continued to have poor healing with substantial pain affecting QOL. 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 Lajoyce Corners. Post op therapies initiated and patient with good participation and able to weight bear and take a couple of hopping steps.    Patient transferred to CIR on 11/27/2012 .   Patient currently requires min with mobility secondary to muscle weakness and muscle joint tightness and decreased cardiorespiratoy endurance.  Prior to hospitalization, patient was modified independent  with mobility and lived with Spouse in a House home.  Home access is   Level entry.  Patient will benefit from skilled PT intervention to maximize safe functional mobility, minimize fall risk and decrease caregiver burden for planned discharge home with intermittent assist.  Anticipate patient will benefit from follow up [facility] at discharge.  Skilled Therapeutic Intervention   PT Evaluation Precautions/Restrictions Precautions Precautions: Fall Precaution Comments: Chronic O2 at 3L (prior to admission) Restrictions LLE Weight Bearing: Non weight bearing General Chart Reviewed: Yes Family/Caregiver Present: No  Vital SignsOxygen Therapy O2 Device: Nasal cannula O2 Flow Rate (L/min): 3 L/min Pain Pain Assessment Pain Assessment: 0-10 Pain Score: 6  (currently 0/10 at rest and ~ 45' post meds) Pain Type: Surgical pain Pain Location: Leg Pain Orientation: Left Pain Descriptors / Indicators: Aching;Sore Pain Onset: On-going Patients Stated Pain Goal: 3 Pain Intervention(s): Medication (See eMAR);Repositioned Multiple Pain Sites: No Home Living/Prior Functioning Home Living Available Help at Discharge: Family;Available PRN/intermittently (wife works  [id number], TH,F 9a-530p [employer] ) Type of Home: House Home Access: Level entry Home Layout: One level Additional Comments: Pt's second bathroom may be w/c accessible, wife will measure doorways  Lives With: Spouse Prior Function Level of Independence: Independent with basic ADLs;Independent with gait;Independent with homemaking with ambulation;Independent with transfers (3L O2 24/7) Driving: Yes (past 4 months drove more and up more than prior 20 months) 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[employer]) Comments: Patient states that pain med reduciton gave him more energy to get up and out of the home (PPast 4 months had reduction of pain meds and got up more ) Vision/Perception  Vision - History Baseline Vision: Wears glasses only for reading Visual History: Other  (comment) (blind in right eye) Patient Visual Report: No change from baseline Vision - Assessment Vision Assessment: Vision not tested  Cognition Overall Cognitive Status: Within Functional Limits for tasks assessed Orientation Level: Oriented X4 Sensation Sensation Light Touch: Appears Intact Stereognosis: Appears Intact Hot/Cold: Appears Intact Coordination Fine Motor Movements are Fluid and Coordinated: Yes Motor  Motor Motor: Other (comment) Motor - Skilled Clinical Observations: generalized weakness  Mobility Bed Mobility Bed Mobility: Rolling Right;Right Sidelying to Sit;Sitting - Scoot to Edge of Bed;Sit to Sidelying Right;Scooting to [facility] Rolling Right: 4: Min guard;With rail Right Sidelying to Sit: 4: Min guard;With rails;HOB flat Sitting - Scoot to Edge of Bed: 5: Supervision;With rail Sit to Sidelying Right: 4: Min assist;HOB flat;With rail Scooting to [facility]: 4: Min assist;With rail Transfers Sit to Stand: 4: Min assist;From elevated surface;From bed Stand to Sit: 4: Min assist;To elevated surface Locomotion  Ambulation Ambulation: Yes Ambulation/Gait Assistance: 3: Mod assist Ambulation Distance (Feet): 8 Feet Assistive device: Rolling walker Gait Gait: Yes Gait Pattern: Step-to pattern (hopping, L BKA) Gait velocity: decreased Stairs / Additional Locomotion Stairs: No Wheelchair Mobility Wheelchair Mobility: Yes Wheelchair  Assistance: 4: Min Education officer, museum: Both upper extremities Wheelchair Parts Management: Needs assistance  Trunk/Postural Assessment  Postural Control Postural Control: Within Functional Limits  Balance Balance Balance Assessed: Yes Dynamic Sitting Balance Dynamic Sitting - Level of Assistance: 5: Stand by assistance Static Standing Balance Static Standing - Balance Support: Bilateral upper extremity supported Static Standing - Level of Assistance: 4: Min assist Dynamic Standing Balance Dynamic Standing - Level of  Assistance: 3: Mod assist Extremity Assessment  RLE Assessment RLE Assessment: Within Functional Limits LLE Assessment LLE Assessment: Exceptions to WFL (L BKA, hip WFL, Knee 3-/5 ROM limited by dressing)  FIM:  FIM - Bed/Chair Transfer Bed/Chair Transfer: 4: Supine > Sit: Min A (steadying Pt. > 75%/lift 1 leg);4: Sit > Supine: Min A (steadying pt. > 75%/lift 1 leg);4: Bed > Chair or W/C: Min A (steadying Pt. > 75%);4: Chair or W/C > Bed: Min A (steadying Pt. > 75%) FIM - Locomotion: Wheelchair Distance: 50' Locomotion: Wheelchair: 2: Travels 50 - 149 ft with minimal assistance (Pt.>75%) FIM - Locomotion: Ambulation Locomotion: Ambulation Assistive Devices: Designer, industrial/product Ambulation/Gait Assistance: 3: Mod assist Locomotion: Ambulation: 1: Travels less than 50 ft with moderate assistance (Pt: 50 - 74%) FIM - Locomotion: Stairs Locomotion: Stairs: 0: Activity did not occur (no stairs at home)   Refer to Care Plan for Long Term Goals  Recommendations for other services: None  Discharge Criteria: Patient will be discharged from PT if patient refuses treatment 3 consecutive times without medical reason, if treatment goals not met, if there is a change in medical status, if patient makes no progress towards goals or if patient is discharged from hospital.  The above assessment, treatment plan, treatment alternatives and goals were discussed and mutually agreed upon: by patient  Rex Kras 11/28/2012, 5:46 PM

## 2012-11-28 NOTE — Evaluation (Signed)
Occupational Therapy Assessment and Plan and Skilled Intervention  Patient Details  Name: Jeremy [name] MRN: [id number] Date of Birth: 03-Mar-1951  OT Diagnosis: acute pain and muscle weakness (generalized) Rehab Potential: Rehab Potential: Excellent ELOS: 7-10 days   Today's Date: 11/28/2012 Time: 0810-0830 and 8295-6213 and 1500-1530 Time Calculation (min): 20 min and 60 min and 30 min  Problem List:  Patient Active Problem List   Diagnosis Date Noted  . Bacteremia 11/20/2012  . Cellulitis 11/17/2012  . DVT (deep venous thrombosis) 11/17/2012  . PVD (peripheral vascular disease) 11/17/2012  . COPD (chronic obstructive pulmonary disease) 11/17/2012  . Psoriasis 11/17/2012  . Chronic pain syndrome 11/17/2012  . Chronic anemia 11/17/2012  . Diabetes mellitus with peripheral vascular disease 11/17/2012  . Cellulitis of left lower leg 07/27/2011  . 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 GERD (gastroesophageal reflux disease)   . Asthma   . Shortness of breath   . Leg pain   . Peripheral vascular disease   . Sleep apnea     uses bipap, sleep study done in [place] 2 years ago, dr Cala Bradford byrd  . Anemia   . Headache(784.0)     sinus headaches  . COPD (chronic obstructive pulmonary disease)     3L continuous  . Peripheral neuropathy   . Cancer     hx basal cell skin cancer  . Psoriasis   . 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 CHF (congestive heart failure)     does not see a cardiologist  . Diabetes mellitus     not on medication   Past Surgical History:  Past Surgical History  Procedure Laterality Date  . Arm surgery    . Vein ligation and stripping    . Tummy tuck    . Stomach stapling    . Skin grafts    . I&d extremity  03/13/2011    Procedure: IRRIGATION AND DEBRIDEMENT EXTREMITY;  Surgeon: Nadara Mustard, MD;  Location: [facility] OR;  Service:  Orthopedics;  Laterality: Left;  . Skin split graft  03/13/2012    Procedure: SKIN GRAFT SPLIT THICKNESS;  Surgeon: Nadara Mustard, MD;  Location: [facility] OR;  Service: Orthopedics;  Laterality: Left;  Excisional Debridement Left Leg, Split Thickness Skin Graft, Wound VAC  . Back stimulator    . Apligraft placement Left 07/31/2012    Procedure: Apply Skin Graft;  Surgeon: Nadara Mustard, MD;  Location[facility] OR;  Service: Orthopedics;  Laterality: Left;  . Application of wound vac Left 07/31/2012    Procedure: APPLICATION OF WOUND VAC;  Surgeon: Nadara Mustard, MD;  Location: [facility] OR;  Service: Orthopedics;  Laterality: Left;  . Cardiac catheterization      Hx: of " around 1985 at [facility]"  . Cyst excision      Hx: of  . I&d extremity Left 11/06/2012    Procedure: IRRIGATION AND DEBRIDEMENT EXTREMITY-Left lower ;  Surgeon: Nadara Mustard, MD;  Location: [facility] OR;  Service: Orthopedics;  Laterality: Left;  Left Leg Irrigation and Debridement, Place Skin Graft, Wound VAC, [place]in  . Amputation Left 11/25/2012    Procedure: AMPUTATION BELOW KNEE;  Surgeon: Nadara Mustard, MD;  Location: [facility] OR;  Service: Orthopedics;  Laterality: Left;  Left Below Knee Amputation    Assessment & Plan Clinical Impression:  Jeremy Clausen  [name] is a 62 y.o. male with history of DM-diet controlled, peripheral neuropathy, COPD-O2 dependent, left leg ulcer X 20 years with recent worsening. Patient with I and D abscess 07/18 as well as IV antibiotics for MRSA/pseudomonas infection but continued to have poor healing with substantial pain affecting QOL. 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 Lajoyce Corners. Post op therapies initiated and patient with good participation and able to weight bear and take a couple of hopping steps. Therapy team recommended CIR  Patient transferred to CIR on 11/27/2012 .    Patient currently requires min with basic self-care skills of toileting and transfers and set up at bed level for B/D secondary to muscle weakness, decreased  oxygen support and decreased standing balance.  Prior to hospitalization, patient was independent with basic self care, simple meal prep, mobility, but would spend most of the day in the bed due to chronic LLE pain and ulcers. Patient will benefit from skilled intervention to increase independence with basic self-care skills prior to discharge home with care partner.  Anticipate patient will require intermittent supervision and follow up home health.  OT - End of Session Activity Tolerance: Tolerates 30+ min activity with multiple rests Endurance Deficit: Yes Endurance Deficit Description: pt is O2 dependent OT Assessment Rehab Potential: Excellent Barriers to Discharge: Decreased caregiver support Barriers to Discharge Comments: Wife plans to take a few days off work before a weekend to be able to have 4-5 days at home with pt after d/c. OT Plan OT Intensity: Minimum of 1-2 x/day, 45 to 90 minutes OT Frequency: 5 out of 7 days OT Duration/Estimated Length of Stay: 7-10 days OT Treatment/Interventions: Balance/vestibular training;Discharge planning;DME/adaptive equipment instruction;Functional mobility training;Pain management;Patient/family education;Self Care/advanced ADL retraining;Therapeutic Activities;Therapeutic Exercise;UE/LE Strength taining/ROM;UE/LE Coordination activities OT Recommendation Patient destination: Home Follow Up Recommendations: Home health OT Equipment Recommended: 3 in 1 bedside comode;Tub/shower bench   Skilled Therapeutic Intervention Visit 1:  Pt seen for initial evaluation.  Pt stated that he was not in pain at this time.   Visit 2:Pain: 5/10 - nursing provided medication. TX:   Pt seen for BADL retraining of B/D and toilet transfers.  Pt sat to EOB demonstrating good safety awareness to bathe and dress with lateral leans.  He worked on 2 transfers bed to chair: one with a squat pivot with close supervision and one with a RW with min assist.  He also  practiced wc to toilet using grab bar for a stand pivot.  Pt tolerated therapy well. Pt set up to prepare for lunch with call light in place.   Visit 3:Pain: No c/o pain. TX:  Pt seen this session to address tub bench transfers.  He propelled self from his room to ADL apartment (over 200 feet) and then transferred to tub bench with close supervision and assist to steady w/c.  Discussed possibilty of using bench if BR doors are wide enough.   He transferred off and then propelled back to room and transferred back to bed with supervision and assist to move O2 tank.  OT Evaluation Precautions/Restrictions  Precautions Precautions: Fall Precaution Comments: Chronic O2 at 3L (prior to admission) Restrictions LLE Weight Bearing: Non weight bearing Oxygen Therapy O2 Device: Nasal cannula O2 Flow Rate (L/min): 3 L/min Home Living/Prior Functioning Home Living Available Help at Discharge: Family;Available PRN/intermittently (wife works during the day) Type of Home: House Home Access: Level entry Home Layout: One level Additional Comments: Pt's second bathroom may be w/c accessible,  Doorways are  30 inches  Lives With: Spouse IADL History Homemaking Responsibilities: Yes Meal Prep Responsibility: Primary (for lunch) Prior Function Level of Independence: Independent with basic ADLs;Independent with homemaking with ambulation (basic meal prep only) Driving: No Vocation: Retired Comments: pt states he has been bedridden for the last 20 months, uses 3L of O2 at home ADL  Refer to [facility] Vision/Perception  Vision - History Baseline Vision: Wears glasses only for reading Visual History: Other (comment) (blind in right eye) Patient Visual Report: No change from baseline Vision - Assessment Vision Assessment: Vision not tested  Cognition Overall Cognitive Status: Within Functional Limits for tasks assessed Orientation Level: Oriented X4 Sensation Sensation Light Touch: Appears  Intact Stereognosis: Appears Intact Hot/Cold: Appears Intact Coordination Fine Motor Movements are Fluid and Coordinated: Yes Motor  Motor Motor: Other (comment) Motor - Skilled Clinical Observations: generalized muscle weakness Mobility    Refer to FIM Trunk/Postural Assessment  Cervical Assessment Cervical Assessment: Within Functional Limits Thoracic Assessment Thoracic Assessment: Within Functional Limits Lumbar Assessment Lumbar Assessment: Within Functional Limits Postural Control Postural Control: Within Functional Limits  Balance Static Sitting Balance Static Sitting - Level of Assistance: 7: Independent Dynamic Sitting Balance Dynamic Sitting - Level of Assistance: 5: Stand by assistance Static Standing Balance Static Standing - Level of Assistance: 4: Min assist Dynamic Standing Balance Dynamic Standing - Level of Assistance: 3: Mod assist;Other (comment) (with RW) Extremity/Trunk Assessment RUE Assessment RUE Assessment: Within Functional Limits (limited elbow extension from childhood injury) LUE Assessment LUE Assessment: Within Functional Limits  FIM:  FIM - Eating Eating Activity: 7: Complete independence:no helper FIM - Grooming Grooming Steps: Wash, rinse, dry face;Wash, rinse, dry hands Grooming: 5: Set-up assist to obtain items FIM - Bathing Bathing Steps Patient Completed: Chest;Right Arm;Left Arm;Abdomen;Front perineal area;Buttocks;Right upper leg;Right lower leg (including foot);Left upper leg (9/9 parts) Bathing: 5: Supervision: Safety issues/verbal cues FIM - Upper Body Dressing/Undressing Upper body dressing/undressing steps patient completed: Thread/unthread right sleeve of pullover shirt/dresss;Thread/unthread left sleeve of pullover shirt/dress;Put head through opening of pull over shirt/dress;Pull shirt over trunk Upper body dressing/undressing: 5: Set-up assist to: Obtain clothing/put away FIM - Lower Body Dressing/Undressing Lower body  dressing/undressing steps patient completed: Thread/unthread right pants leg;Thread/unthread left pants leg;Pull pants up/down;Don/Doff right sock;Don/Doff right shoe;Fasten/unfasten right shoe Lower body dressing/undressing: 5: Set-up assist to: Obtain clothing FIM - Bed/Chair Transfer Bed/Chair Transfer: 6: Supine > Sit: No assist;6: Sit > Supine: No assist;4: Bed > Chair or W/C: Min A (steadying Pt. > 75%);4: Chair or W/C > Bed: Min A (steadying Pt. > 75%) FIM - Diplomatic Services operational officer Devices: Grab bars Toilet Transfers: 4-From toilet/BSC: Min A (steadying Pt. > 75%);4-To toilet/BSC: Min A (steadying Pt. > 75%)   Refer to Care Plan for Long Term Goals  Recommendations for other services: None  Discharge Criteria: Patient will be discharged from OT if patient refuses treatment 3 consecutive times without medical reason, if treatment goals not met, if there is a change in medical status, if patient makes no progress towards goals or if patient is discharged from hospital.  The above assessment, treatment plan, treatment alternatives and goals were discussed and mutually agreed upon: by patient  [facility] 11/28/2012, 12:25 PM

## 2012-11-28 NOTE — Progress Notes (Signed)
ANTICOAGULATION CONSULT NOTE - Follow Up Consult  Pharmacy Consult for Coumadin Indication: PVD, Hx DVTs  No Known Allergies  Patient Measurements: Height: 5\' 10"  (177.8 cm) Weight: 270 lb (122.471 kg) IBW/kg (Calculated) : 73  Vital Signs: Temp: 98.2 F (36.8 C) (08/09 0432) Temp src: Oral (08/09 0432) BP: 119/70 mmHg (08/09 0432) Pulse Rate: 63 (08/09 0432)  Labs:  Recent Labs  11/25/12 0751  11/25/12 0900 11/26/12 0507 11/27/12 0545 11/28/12 0530  HGB 12.2*  --   --  10.3* 9.8*  --   HCT 36.0*  --   --  30.4* 29.7*  --   PLT 211  --   --  149* 136*  --   APTT  --   --  43*  --   --   --   LABPROT  --   < > 21.3* 23.5* 23.1* 20.5*  INR  --   < > 1.91* 2.17* 2.12* 1.82*  CREATININE 1.09  --   --   --   --   --   < > = values in this interval not displayed.  Estimated Creatinine Clearance: 93.4 ml/min (by C-G formula based on Cr of 1.09).   Assessment: 62yo male with PVD and Hx DVTs, on home dose of Coumadin 7.5mg  daily.  INR subtherapeutic this AM at 1.82.  No bleeding problems noted.  Hgb 9.8 - trending downward - probably due to ABLA s/p amputation 8/6. PLTC 136K.   Goal of Therapy:  INR 2-3 Monitor platelets by anticoagulation protocol: Yes   Plan:  1.  Coumadin 10mg  po today 2.  Continue daily INR 3.  F/u CBC again on Monday  Christoper Fabian, PharmD, BCPS Clinical pharmacist, pager [phone] 11/28/2012   7:51 AM

## 2012-11-28 NOTE — Progress Notes (Signed)
Physical Therapy Session Note  Patient Details  Name: Jeremy [name] MRN: [id number] Date of Birth: 1951-04-15  Today's Date: 11/28/2012 Time: 4098-1191 Time Calculation (min): 45 min  Skilled Therapeutic Interventions/Progress Updates:  Ambulation/gait training;Balance/vestibular training;DME/adaptive equipment instruction;Functional mobility training;Pain management;Patient/family education;Therapeutic Activities;Therapeutic Exercise;UE/LE Strength taining/ROM;Wheelchair propulsion/positioning   Therapy Documentation Precautions:  Precautions: Fall Precaution Comments: Chronic O2 at 3L (prior to admission) Weight Bearing Restrictions: Yes LLE Weight Bearing: Non weight bearing Vital Signs: Therapy Vitals Temp: 98.7 F (37.1 C) Temp src: Oral Pulse Rate: 67 Resp: 18 BP: 109/61 mmHg Patient Position, if appropriate: Lying Oxygen Therapy O2 Device: Nasal cannula O2 Flow Rate (L/min): 3 L/min Pain: Pain Score: 6/10  Pain Type: Surgical pain Pain Location: Leg Pain Orientation: Left Pain Descriptors / Indicators: Aching;Sore Pain Onset: On-going Pain Intervention(s): Medication (See eMAR)  Therapeutic Activity:(15') Bed mobility S/Mod-I using bed rails, Transfers sit<->stand S/min-Guard into RW  Gait Training:(15') 4 x 15' using RW with min-assist with 3L O2 and tank in tow by PT. Patient gets DOE and needed seated rest breaks. W/C management:(15') propelling w/c x 150' controlled environment S/Mod-I, parts management Mod-A   Therapy/Group: Individual Therapy  Lively Haberman J 11/28/2012, 6:01 PM

## 2012-11-29 ENCOUNTER — Inpatient Hospital Stay (HOSPITAL_COMMUNITY): Payer: Medicare Other | Admitting: *Deleted

## 2012-11-29 LAB — PROTIME-INR
INR: 1.82 — ABNORMAL HIGH (ref 0.00–1.49)
Prothrombin Time: 20.5 seconds — ABNORMAL HIGH (ref 11.6–15.2)

## 2012-11-29 MED ORDER — LEVOFLOXACIN 750 MG PO TABS: 750.0000 mg | ORAL_TABLET | Freq: Every day | ORAL | Status: DC

## 2012-11-29 MED ORDER — WARFARIN SODIUM 10 MG PO TABS
10.0000 mg | ORAL_TABLET | Freq: Once | ORAL | Status: AC
  Administered 2012-11-29: 10 mg via ORAL
  Filled 2012-11-29: qty 1

## 2012-11-29 MED ORDER — LEVOFLOXACIN 750 MG PO TABS
750.0000 mg | ORAL_TABLET | Freq: Every day | ORAL | Status: AC
  Administered 2012-11-29 – 2012-12-06 (×8): 750 mg via ORAL
  Filled 2012-11-29 (×8): qty 1

## 2012-11-29 MED ORDER — DEXTROMETHORPHAN POLISTIREX 30 MG/5ML PO LQCR
15.0000 mg | Freq: Two times a day (BID) | ORAL | Status: DC
  Administered 2012-11-29 – 2012-12-09 (×21): 15 mg via ORAL
  Filled 2012-11-29 (×6): qty 2.5
  Filled 2012-11-29: qty 5
  Filled 2012-11-29 (×10): qty 2.5
  Filled 2012-11-29: qty 5
  Filled 2012-11-29 (×2): qty 2.5
  Filled 2012-11-29: qty 5
  Filled 2012-11-29 (×3): qty 2.5

## 2012-11-29 NOTE — Progress Notes (Signed)
Patient ID: Jeremy [name], male   DOB: 1951/03/12, 62 y.o.   MRN: [id number] Subjective/Complaints: 62 y.o. male with history of DM-diet controlled, peripheral neuropathy, COPD-O2 dependent, left leg ulcer X 20 years with recent worsening. Patient with I and D abscess 07/18 as well as IV antibiotics for MRSA/pseudomonas infection but continued to have poor healing with substantial pain affecting QOL. 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 Lajoyce Corners.   D/C ed PICC No problems with therapy yesterday. No sleep issues last night  Review of Systems  Musculoskeletal: Positive for joint pain.  Neurological: Positive for sensory change.  All other systems reviewed and are negative.   Objective: Vital Signs: Blood pressure 120/70, pulse 68, temperature 98.2 F (36.8 C), temperature source Oral, resp. rate 18, height 5\' 10"  (1.778 m), weight 122.471 kg (270 lb), SpO2 98.00%. No results found. Results for orders placed during the hospital encounter of 11/27/12 (from the past 72 hour(s))  PROTIME-INR     Status: Abnormal   Collection Time    11/28/12  5:30 AM      Result Value Range   Prothrombin Time 20.5 (*) 11.6 - 15.2 seconds   INR 1.82 (*) 0.00 - 1.49  PROTIME-INR     Status: Abnormal   Collection Time    11/29/12  6:20 AM      Result Value Range   Prothrombin Time 20.5 (*) 11.6 - 15.2 seconds   INR 1.82 (*) 0.00 - 1.49        Assessment/Plan: 1. Functional deficits secondary to left BKA which require 3+ hours per day of interdisciplinary therapy in a comprehensive inpatient rehab setting. Physiatrist is providing close team supervision and 24 hour management of active medical problems listed below. Physiatrist and rehab team continue to assess barriers to discharge/monitor patient progress toward functional and medical goals. FIM: FIM - Bathing Bathing Steps Patient Completed: Chest;Right Arm;Left Arm;Abdomen;Front perineal area;Buttocks;Right upper leg;Right lower leg (including  foot);Left upper leg (9/9 parts) Bathing: 5: Supervision: Safety issues/verbal cues  FIM - Upper Body Dressing/Undressing Upper body dressing/undressing steps patient completed: Thread/unthread right sleeve of pullover shirt/dresss;Thread/unthread left sleeve of pullover shirt/dress;Put head through opening of pull over shirt/dress;Pull shirt over trunk Upper body dressing/undressing: 5: Set-up assist to: Obtain clothing/put away FIM - Lower Body Dressing/Undressing Lower body dressing/undressing steps patient completed: Thread/unthread right pants leg;Thread/unthread left pants leg;Pull pants up/down;Don/Doff right sock;Don/Doff right shoe;Fasten/unfasten right shoe Lower body dressing/undressing: 5: Set-up assist to: Obtain clothing     FIM - Diplomatic Services operational officer Devices: Grab bars Toilet Transfers: 4-From toilet/BSC: Min A (steadying Pt. > 75%);4-To toilet/BSC: Min A (steadying Pt. > 75%)  FIM - Bed/Chair Transfer Bed/Chair Transfer: 4: Supine > Sit: Min A (steadying Pt. > 75%/lift 1 leg);4: Sit > Supine: Min A (steadying pt. > 75%/lift 1 leg);4: Bed > Chair or W/C: Min A (steadying Pt. > 75%);4: Chair or W/C > Bed: Min A (steadying Pt. > 75%)  FIM - Locomotion: Wheelchair Distance: 50' Locomotion: Wheelchair: 2: Travels 50 - 149 ft with minimal assistance (Pt.>75%) FIM - Locomotion: Ambulation Locomotion: Ambulation Assistive Devices: Designer, industrial/product Ambulation/Gait Assistance: 3: Mod assist Locomotion: Ambulation: 1: Travels less than 50 ft with moderate assistance (Pt: 50 - 74%)  Comprehension Comprehension Mode: Auditory Comprehension: 5-Follows basic conversation/direction: With no assist  Expression Expression Mode: Verbal Expression: 5-Expresses basic needs/ideas: With no assist  Social Interaction Social Interaction: 5-Interacts appropriately 90% of the time - Needs monitoring or encouragement for  participation or interaction.  Problem  Solving Problem Solving: 5-Solves basic problems: With no assist  Memory Memory: 5-Recognizes or recalls 90% of the time/requires cueing < 10% of the time  Medical Problem List and Plan:  1. DVT Prophylaxis/Anticoagulation: Pharmaceutical: Coumadin  2. Chronic Pain Management: Has spinal cord stimulator. Continue Fentanyl patch, OxyContin, Oxycodone, Topamax and Cymbalta. As pain subsides may be able to adjust medications 3. Mood: Has realist goals and reasonable outlook overall. Will have LCSW follow for evaluation.  4. Neuropsych: This patient is capable of making decisions on his own behalf.  5. ABLA on chronic anemia: Will continue iron supplement. Recheck labs on Monday.  6. O2 dependent/OSA: Encouraged to wear BIPAP at nights to help with sleep hygiene as well as energy levels. Wife to bring in settings--will resume and have RT help titrate.  7. Morbid Obesity: s/p stomach stapling. Used to weigh 450+ lbs. Pressure relief measures. Will consult dietician to assist with nutritional needs.  8. DM type 2: diet controlled. Will monitor with ac/hs checks and use SSI for tighter BS management post op to help with wound healing.   9. Hypokalemia: Increase supplement to bid and recheck on Monday.  10. COPD: continue Singulair and Dulera. Monitor respiratory status with increase in activity, has increasing cough, lungs are clear but given history will start antibiotics  LOS (Days) 2 A FACE TO FACE EVALUATION WAS PERFORMED  KIRSTEINS,ANDREW E 11/29/2012, 10:28 AM

## 2012-11-29 NOTE — Progress Notes (Signed)
ANTICOAGULATION CONSULT NOTE - Follow Up Consult  Pharmacy Consult for Coumadin Indication: PVD, Hx DVTs  No Known Allergies  Patient Measurements: Height: 5\' 10"  (177.8 cm) Weight: 270 lb (122.471 kg) IBW/kg (Calculated) : 73  Vital Signs: Temp: 98.2 F (36.8 C) (08/10 0544) Temp src: Oral (08/10 0544) BP: 120/70 mmHg (08/10 0544) Pulse Rate: 68 (08/10 0544)  Labs:  Recent Labs  11/27/12 0545 11/28/12 0530 11/29/12 0620  HGB 9.8*  --   --   HCT 29.7*  --   --   PLT 136*  --   --   LABPROT 23.1* 20.5* 20.5*  INR 2.12* 1.82* 1.82*    Estimated Creatinine Clearance: 93.4 ml/min (by C-G formula based on Cr of 1.09).   Assessment: 62yo male with PVD and Hx DVTs, on home dose of Coumadin 7.5mg  daily. No bleeding problems noted.  8/8 Hgb 9.8 - trending downward - probably due to ABLA s/p amputation 8/6. PLTC 136K. INR 1.82 again today. Will watch closely with addition of levaquin which may potentiate effects of coumadin.  Goal of Therapy:  INR 2-3 Monitor platelets by anticoagulation protocol: Yes   Plan:  1.  Coumadin 10mg  po today 2.  Continue daily INR 3.  F/u CBC again on Monday  Christoper Fabian, PharmD, BCPS Clinical pharmacist, pager [phone] 11/29/2012   11:09 AM

## 2012-11-29 NOTE — Progress Notes (Signed)
Physical Therapy Session Note  Patient Details  Name: Jeremy [name] MRN: [id number] Date of Birth: Jan 24, 1951  Today's Date: 11/29/2012 Time: 1420-1505 Time Calculation (min): 45 min  Skilled Therapeutic Interventions/Progress Updates:  Multiple transfers to and from w/c to mat and bed t/o the session with min A and cues for safety and sequencing. W/c propulsion to and from the gym with use of B UE and R LE. Sit to stand 5 x with min A, pull to stand in front of II bars 3 x with supervision. Squats with holding to II bars 2 x 10 , w/c push ups 2 x 10. To facilitate standing balance patient completed rainbow ROM arch activity 4 x with one hand support.Gait training with RW 3 x 12 feet with min A.  Therapy Documentation Precautions:  Precautions Precautions: Fall Precaution Comments: Chronic O2 at 3L (prior to admission) Restrictions Weight Bearing Restrictions: Yes LLE Weight Bearing: Non weight bearing Pain: Pain Assessment Pain Assessment: 0-10 Pain Score: 6  Pain Type: Surgical pain Pain Location: Leg Pain Orientation: Left Pain Descriptors / Indicators: Aching;Sore Pain Onset: Gradual Pain Intervention(s): Medication (See eMAR)  See FIM for current functional status  Therapy/Group: Individual Therapy  Dorna Mai 11/29/2012, 4:13 PM

## 2012-11-30 ENCOUNTER — Inpatient Hospital Stay (HOSPITAL_COMMUNITY): Payer: Medicare Other | Admitting: Occupational Therapy

## 2012-11-30 ENCOUNTER — Inpatient Hospital Stay (HOSPITAL_COMMUNITY): Payer: Medicare Other

## 2012-11-30 DIAGNOSIS — I739 Peripheral vascular disease, unspecified: Secondary | ICD-10-CM

## 2012-11-30 DIAGNOSIS — S88119A Complete traumatic amputation at level between knee and ankle, unspecified lower leg, initial encounter: Secondary | ICD-10-CM

## 2012-11-30 DIAGNOSIS — L98499 Non-pressure chronic ulcer of skin of other sites with unspecified severity: Secondary | ICD-10-CM

## 2012-11-30 LAB — CBC WITH DIFFERENTIAL/PLATELET
Basophils Absolute: 0 10*3/uL (ref 0.0–0.1)
Basophils Relative: 1 % (ref 0–1)
Eosinophils Absolute: 0.1 10*3/uL (ref 0.0–0.7)
MCH: 31.4 pg (ref 26.0–34.0)
MCHC: 33.1 g/dL (ref 30.0–36.0)
Neutro Abs: 1.7 10*3/uL (ref 1.7–7.7)
Neutrophils Relative %: 44 % (ref 43–77)
Platelets: 173 10*3/uL (ref 150–400)
RDW: 13.5 % (ref 11.5–15.5)

## 2012-11-30 LAB — GLUCOSE, CAPILLARY
Glucose-Capillary: 95 mg/dL (ref 70–99)
Glucose-Capillary: 103 mg/dL — ABNORMAL HIGH (ref 70–99)

## 2012-11-30 LAB — COMPREHENSIVE METABOLIC PANEL
AST: 15 U/L (ref 0–37)
Albumin: 2.4 g/dL — ABNORMAL LOW (ref 3.5–5.2)
Alkaline Phosphatase: 75 U/L (ref 39–117)
Chloride: 103 mEq/L (ref 96–112)
Potassium: 3.7 mEq/L (ref 3.5–5.1)
Total Bilirubin: 0.3 mg/dL (ref 0.3–1.2)
Total Protein: 5.8 g/dL — ABNORMAL LOW (ref 6.0–8.3)

## 2012-11-30 LAB — PROTIME-INR
INR: 1.92 — ABNORMAL HIGH (ref 0.00–1.49)
Prothrombin Time: 21.4 seconds — ABNORMAL HIGH (ref 11.6–15.2)

## 2012-11-30 MED ORDER — FLUTICASONE PROPIONATE 50 MCG/ACT NA SUSP
1.0000 | Freq: Every day | NASAL | Status: AC
  Administered 2012-11-30 – 2012-12-02 (×3): 1 via NASAL
  Filled 2012-11-30: qty 16

## 2012-11-30 MED ORDER — SALINE SPRAY 0.65 % NA SOLN
1.0000 | Freq: Two times a day (BID) | NASAL | Status: DC
  Administered 2012-11-30 – 2012-12-08 (×13): 1 via NASAL
  Filled 2012-11-30 (×2): qty 44

## 2012-11-30 MED ORDER — WARFARIN SODIUM 10 MG PO TABS
10.0000 mg | ORAL_TABLET | Freq: Once | ORAL | Status: AC
  Administered 2012-11-30: 10 mg via ORAL
  Filled 2012-11-30: qty 1

## 2012-11-30 MED ORDER — FUROSEMIDE 40 MG PO TABS
40.0000 mg | ORAL_TABLET | Freq: Every day | ORAL | Status: DC
  Administered 2012-12-01 – 2012-12-08 (×8): 40 mg via ORAL
  Filled 2012-11-30 (×9): qty 1

## 2012-11-30 NOTE — Progress Notes (Signed)
Occupational Therapy Session Notes  Patient Details  Name: Jeremy [name] MRN: [id number] Date of Birth: [birth year]  Today's Date: 11/30/2012  Short Term Goals: Week 1:  OT Short Term Goal 1 (Week 1): STGs = LTGs based on LOS  Skilled Therapeutic Interventions/Progress Updates:   Session #1 [phone] - 55 Minutes Individual Therapy Patient with 6/10 complaints of pain "it's getting worse", RN notified and administered medications.  Patient blew nose multiple times during session and tissue with traces of blood, notified RN. Patient found supine in bed upon entering room. Patient engaged in bed mobility using bed rails prn and sat edge of bed with supervision for breakfast. While eating breakfast discussed home set-up with patient and OT LTGs. Patient stated that his wife worked during the day, but they would complete bathing tasks at night (supervision goals set for bathing at this time). After breakfast, patient engaged in UB/LB bathing and dressing. Patient completed UB seated EOB with set-up assist and LB supine in bed with supervision->min assist. From here, patient transferred edge of bed -> w/c using scoot/squat technique with close supervision; originally patient attempted/wanted to use walker during transfer. Therapist recommends scoot/squat technique to increase independence and safety during transfers (especially if patient will be home alone). At end of session, patient left seated in w/c for next therapy session.   Session #2 [id number] - 45 Minutes Individual Therapy No complaints of pain Patient found supine in bed upon entering room. Patient engaged in bed mobility -> edge of bed then performed EOB -> w/c transfer with close supervision (squat pivot technique). From here therapist educated patient on some w/c management tasks such as donning bilateral leg rests, patient with difficult time completing this task at this time. From here, patient propelled self -> therapy gym. Once in  therapy gym, therapist educated patient on w/c pushups and theraband exercises to help increase patient's overall UB strength. Patient return demonstrated theraband exercises. At end of session, left patient seated in w/c for next therapy session.  Precautions:  Precautions Precautions: Fall Precaution Comments: Chronic O2 at 3L (prior to admission) Restrictions Weight Bearing Restrictions: Yes LLE Weight Bearing: Non weight bearing  See FIM for current functional status  Wen Merced 11/30/2012, 7:32 AM

## 2012-11-30 NOTE — Progress Notes (Signed)
Physical Therapy Session Note  Patient Details  Name: Jeremy [name] MRN: [id number] Date of Birth: Jan 10, 1951  Today's Date: 11/30/2012 Time: 1350-1420 Time Calculation (min): 30 min  Short Term Goals: Week 1:  PT Short Term Goal 1 (Week 1): = LTGs  Skilled Therapeutic Interventions/Progress Updates:   Pt declined gait/standing activities due to feeling like his "head is going to blow" from this cold. Agreeable to supine therex for strengthening and ROM to residual limb including knee flexion/extension, hip abduction, hip flexion and sidelying hip extension x 10 reps x 2 sets each. Min A squat pivot transfers to/from mat with cues for hand placement and safety. Transferred back to bed end of session with RW with min A; extra time needed to problem solve where to place hands.   Therapy Documentation Precautions:  Precautions Precautions: Fall Precaution Comments: Chronic O2 at 3L (prior to admission) Restrictions Weight Bearing Restrictions: Yes LLE Weight Bearing: Non weight bearing  Pain: Denies pain.  See FIM for current functional status  Therapy/Group: Individual Therapy  Jeremy, [name] [facility] 11/30/2012, 2:25 PM

## 2012-11-30 NOTE — Plan of Care (Signed)
Problem: RH SKIN INTEGRITY Goal: RH STG ABLE TO PERFORM INCISION/WOUND CARE W/ASSISTANCE STG Able To Perform Incision/Wound Care With Assistance. Min A  Outcome: Not Progressing Left stump unable to be changed at this time. Surgical dressing inplace

## 2012-11-30 NOTE — Progress Notes (Signed)
Patient information reviewed and entered into eRehab system by Dorthia Tout, RN, CRRN, PPS Coordinator.  Information including medical coding and functional independence measure will be reviewed and updated through discharge.     Per nursing patient was given "Data Collection Information Summary for Patients in Inpatient Rehabilitation Facilities with attached "Privacy Act Statement-Health Care Records" upon admission.  

## 2012-11-30 NOTE — Progress Notes (Signed)
Patient ID: SIRRON FRANCESCONI, male   DOB: 1950-08-16, 62 y.o.   MRN: [id number] Subjective/Complaints: 62 y.o. male with history of DM-diet controlled, peripheral neuropathy, COPD-O2 dependent, left leg ulcer X 20 years with recent worsening. Patient with I and D abscess 07/18 as well as IV antibiotics for MRSA/pseudomonas infection but continued to have poor healing with substantial pain affecting QOL. 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 Lajoyce Corners.   Asking to have lasix at 4pm after PT/OT  Review of Systems  Musculoskeletal: Positive for joint pain.  Neurological: Positive for sensory change.  All other systems reviewed and are negative.   Objective: Vital Signs: Blood pressure 128/73, pulse 58, temperature 98.1 F (36.7 C), temperature source Oral, resp. rate 17, height 5\' 10"  (1.778 m), weight 122.471 kg (270 lb), SpO2 99.00%. No results found. Results for orders placed during the hospital encounter of 11/27/12 (from the past 72 hour(s))  PROTIME-INR     Status: Abnormal   Collection Time    11/28/12  5:30 AM      Result Value Range   Prothrombin Time 20.5 (*) 11.6 - 15.2 seconds   INR 1.82 (*) 0.00 - 1.49  PROTIME-INR     Status: Abnormal   Collection Time    11/29/12  6:20 AM      Result Value Range   Prothrombin Time 20.5 (*) 11.6 - 15.2 seconds   INR 1.82 (*) 0.00 - 1.49  CBC WITH DIFFERENTIAL     Status: Abnormal   Collection Time    11/30/12  5:00 AM      Result Value Range   WBC 3.9 (*) 4.0 - 10.5 K/uL   RBC 3.06 (*) 4.22 - 5.81 MIL/uL   Hemoglobin 9.6 (*) 13.0 - 17.0 g/dL   HCT 08.6 (*) 57.8 - 46.9 %   MCV 94.8  78.0 - 100.0 fL   MCH 31.4  26.0 - 34.0 pg   MCHC 33.1  30.0 - 36.0 g/dL   RDW 62.9  52.8 - 41.3 %   Platelets 173  150 - 400 K/uL   Neutrophils Relative % 44  43 - 77 %   Neutro Abs 1.7  1.7 - 7.7 K/uL   Lymphocytes Relative 27  12 - 46 %   Lymphs Abs 1.1  0.7 - 4.0 K/uL   Monocytes Relative 24 (*) 3 - 12 %   Monocytes Absolute 1.0  0.1 - 1.0 K/uL    Eosinophils Relative 3  0 - 5 %   Eosinophils Absolute 0.1  0.0 - 0.7 K/uL   Basophils Relative 1  0 - 1 %   Basophils Absolute 0.0  0.0 - 0.1 K/uL  COMPREHENSIVE METABOLIC PANEL     Status: Abnormal   Collection Time    11/30/12  5:00 AM      Result Value Range   Sodium 141  135 - 145 mEq/L   Potassium 3.7  3.5 - 5.1 mEq/L   Chloride 103  96 - 112 mEq/L   CO2 29  19 - 32 mEq/L   Glucose, Bld 98  70 - 99 mg/dL   BUN 22  6 - 23 mg/dL   Creatinine, Ser 2.44  0.50 - 1.35 mg/dL   Calcium 8.6  8.4 - 01.0 mg/dL   Total Protein 5.8 (*) 6.0 - 8.3 g/dL   Albumin 2.4 (*) 3.5 - 5.2 g/dL   AST 15  0 - 37 U/L   ALT 10  0 -  53 U/L   Alkaline Phosphatase 75  39 - 117 U/L   Total Bilirubin 0.3  0.3 - 1.2 mg/dL   GFR calc non Af Amer 87 (*) >90 mL/min   GFR calc Af Amer >90  >90 mL/min   Comment:            The eGFR has been calculated     using the CKD EPI equation.     This calculation has not been     validated in all clinical     situations.     eGFR's persistently     <90 mL/min signify     possible Chronic Kidney Disease.  PROTIME-INR     Status: Abnormal   Collection Time    11/30/12  5:00 AM      Result Value Range   Prothrombin Time 21.4 (*) 11.6 - 15.2 seconds   INR 1.92 (*) 0.00 - 1.49  GLUCOSE, CAPILLARY     Status: None   Collection Time    11/30/12  7:34 AM      Result Value Range   Glucose-Capillary 92  70 - 99 mg/dL   Comment 1 Notify RN          Assessment/Plan: 1. Functional deficits secondary to left BKA which require 3+ hours per day of interdisciplinary therapy in a comprehensive inpatient rehab setting. Physiatrist is providing close team supervision and 24 hour management of active medical problems listed below. Physiatrist and rehab team continue to assess barriers to discharge/monitor patient progress toward functional and medical goals. FIM: FIM - Bathing Bathing Steps Patient Completed: Chest;Right Arm;Left Arm;Abdomen;Front perineal area;Right  upper leg;Left upper leg Bathing: 4: Min-Patient completes 8-9 41f 10 parts or 75+ percent (7/9)  FIM - Upper Body Dressing/Undressing Upper body dressing/undressing steps patient completed: Thread/unthread right sleeve of pullover shirt/dresss;Thread/unthread left sleeve of pullover shirt/dress;Put head through opening of pull over shirt/dress;Pull shirt over trunk Upper body dressing/undressing: 5: Set-up assist to: Obtain clothing/put away FIM - Lower Body Dressing/Undressing Lower body dressing/undressing steps patient completed: Thread/unthread right pants leg;Thread/unthread left pants leg;Pull pants up/down;Don/Doff right shoe;Fasten/unfasten right shoe Lower body dressing/undressing: 5: Set-up assist to: Obtain clothing (supine in bed)  FIM - Toileting Toileting: 0: Activity did not occur  FIM - Diplomatic Services operational officer Devices: Therapist, music Transfers: 0-Activity did not occur  FIM - Banker Devices: Arm rests Bed/Chair Transfer: 5: Supine > Sit: Supervision (verbal cues/safety issues);5: Bed > Chair or W/C: Supervision (verbal cues/safety issues)  FIM - Locomotion: Wheelchair Distance: 50' Locomotion: Wheelchair: 5: Travels 150 ft or more: maneuvers on rugs and over door sills with supervision, cueing or coaxing FIM - Locomotion: Ambulation Locomotion: Ambulation Assistive Devices: Designer, industrial/product Ambulation/Gait Assistance: 4: Min assist Locomotion: Ambulation: 1: Travels less than 50 ft with minimal assistance (Pt.>75%)  Comprehension Comprehension Mode: Auditory Comprehension: 6-Follows complex conversation/direction: With extra time/assistive device  Expression Expression Mode: Verbal Expression: 6-Expresses complex ideas: With extra time/assistive device  Social Interaction Social Interaction: 6-Interacts appropriately with others with medication or extra time (anti-anxiety, antidepressant).  Problem  Solving Problem Solving: 6-Solves complex problems: With extra time  Memory Memory: 6-More than reasonable amt of time  Medical Problem List and Plan:  1. DVT Prophylaxis/Anticoagulation: Pharmaceutical: Coumadin  2. Chronic Pain Management: Has spinal cord stimulator. Continue Fentanyl patch, OxyContin, Oxycodone, Topamax and Cymbalta. As pain subsides may be able to adjust medications 3. Mood: Has realist goals and reasonable outlook overall. Will have  LCSW follow for evaluation.  4. Neuropsych: This patient is capable of making decisions on his own behalf.  5. ABLA on chronic anemia: Will continue iron supplement. Recheck labs on Monday.  6. O2 dependent/OSA: Encouraged to wear BIPAP at nights to help with sleep hygiene as well as energy levels. Wife to bring in settings--will resume and have RT help titrate.  7. Morbid Obesity: s/p stomach stapling. Used to weigh 450+ lbs. Pressure relief measures. Will consult dietician to assist with nutritional needs.  8. DM type 2: diet controlled. Will monitor with ac/hs checks and use SSI for tighter BS management post op to help with wound healing.   9. Hypokalemia: Increase supplement to bid and recheck on Monday.  10. COPD: continue Singulair and Dulera. Monitor respiratory status with increase in activity, has increasing cough, lungs are clear but given history will start antibiotics  LOS (Days) 3 A FACE TO FACE EVALUATION WAS PERFORMED  KIRSTEINS,ANDREW E 11/30/2012, 10:20 AM

## 2012-11-30 NOTE — Progress Notes (Addendum)
ANTICOAGULATION CONSULT NOTE - Follow Up Consult  Pharmacy Consult for Coumadin Indication: PVD, history of DVTs  No Known Allergies  Patient Measurements: Height: 5\' 10"  (177.8 cm) Weight: 270 lb (122.471 kg) IBW/kg (Calculated) : 73 Heparin Dosing Weight:   Vital Signs: Temp: 98.1 F (36.7 C) (08/11 0506) Temp src: Oral (08/11 0506) BP: 128/73 mmHg (08/11 0506) Pulse Rate: 58 (08/11 0506)  Labs:  Recent Labs  11/28/12 0530 11/29/12 0620 11/30/12 0500  HGB  --   --  9.6*  HCT  --   --  29.0*  PLT  --   --  173  LABPROT 20.5* 20.5* 21.4*  INR 1.82* 1.82* 1.92*  CREATININE  --   --  0.97    Estimated Creatinine Clearance: 105 ml/min (by C-G formula based on Cr of 0.97).   Medications:  Scheduled:  . dextromethorphan  15 mg Oral BID  . DULoxetine  30 mg Oral Daily  . feeding supplement  30 mL Oral BID  . fentaNYL  25 mcg Transdermal Q72H  . ferrous sulfate  325 mg Oral TID WC  . fluocinonide-emollient   Topical TID  . [START ON 12/01/2012] furosemide  40 mg Oral Q1500  . levofloxacin  750 mg Oral Daily  . mometasone-formoterol  2 puff Inhalation BID  . montelukast  10 mg Oral QHS  . OxyCODONE  15 mg Oral Q12H  . potassium chloride SA  20 mEq Oral BID  . simvastatin  5 mg Oral q1800  . topiramate  100 mg Oral BID  . 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  INR 1.92 this AM.  No bleeding problems noted.  Hg improved on Fe therapy and pltc wnl.  Pt is on Levofloxacin for COPD exacerbation which may increase Coumadin effect.  Goal of Therapy:  INR 2-3 Monitor platelets by anticoagulation protocol: Yes   Plan:  1.  Repeat Coumadin 10mg  2.  Daily INR  Marisue Humble, PharmD Clinical Pharmacist [facility]

## 2012-11-30 NOTE — Progress Notes (Signed)
Social Work  Social Work Assessment and Plan  Patient Details  Name: Jeremy [name] MRN: [id number] Date of Birth: 1950-12-30  Today's Date: 11/30/2012  Problem List:  Patient Active Problem List   Diagnosis Date Noted  . Unilateral complete BKA--left 11/30/2012  . Bacteremia 11/20/2012  . Cellulitis 11/17/2012  . DVT (deep venous thrombosis) 11/17/2012  . PVD (peripheral vascular disease) 11/17/2012  . COPD (chronic obstructive pulmonary disease) 11/17/2012  . Psoriasis 11/17/2012  . Chronic pain syndrome 11/17/2012  . Chronic anemia 11/17/2012  . Diabetes mellitus with peripheral vascular disease 11/17/2012  . Cellulitis of left lower leg 07/27/2011  . 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 GERD (gastroesophageal reflux disease)   . Asthma   . Shortness of breath   . Leg pain   . Peripheral vascular disease   . Sleep apnea     uses bipap, sleep study done in [place] 2 years ago, dr Cala Bradford byrd  . Anemia   . Headache(784.0)     sinus headaches  . COPD (chronic obstructive pulmonary disease)     3L continuous  . Peripheral neuropathy   . Cancer     hx basal cell skin cancer  . Psoriasis   . 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 CHF (congestive heart failure)     does not see a cardiologist  . Diabetes mellitus     not on medication   Past Surgical History:  Past Surgical History  Procedure Laterality Date  . Arm surgery    . Vein ligation and stripping    . Tummy tuck    . Stomach stapling    . Skin grafts    . I&d extremity  03/13/2011    Procedure: IRRIGATION AND DEBRIDEMENT EXTREMITY;  Surgeon: Nadara Mustard, MD;  Location: [facility] OR;  Service: Orthopedics;  Laterality: Left;  . Skin split graft  03/13/2012    Procedure: SKIN GRAFT SPLIT THICKNESS;  Surgeon: Nadara Mustard, MD;  Location: [facility] OR;  Service: Orthopedics;  Laterality: Left;   Excisional Debridement Left Leg, Split Thickness Skin Graft, Wound VAC  . Back stimulator    . Apligraft placement Left 07/31/2012    Procedure: Apply Skin Graft;  Surgeon: Nadara Mustard, MD;  Location: [facility] OR;  Service: Orthopedics;  Laterality: Left;  . Application of wound vac Left 07/31/2012    Procedure: APPLICATION OF WOUND VAC;  Surgeon: Nadara Mustard, MD;  Location: [facility] OR;  Service: Orthopedics;  Laterality: Left;  . Cardiac catheterization      Hx: of " around 1985 at [facility]"  . Cyst excision      Hx: of  . I&d extremity Left 11/06/2012    Procedure: IRRIGATION AND DEBRIDEMENT EXTREMITY-Left lower ;  Surgeon: Nadara Mustard, MD;  Location: [facility] OR;  Service: Orthopedics;  Laterality: Left;  Left Leg Irrigation and Debridement, Place Skin Graft, Wound VAC, [place]  . Amputation Left 11/25/2012    Procedure: AMPUTATION BELOW KNEE;  Surgeon: Nadara Mustard, MD;  Location: [facility] OR;  Service: Orthopedics;  Laterality: Left;  Left Below Knee Amputation   Social History:  reports that he quit smoking about 22 years ago. His smoking use included Cigarettes. He has a 3 pack-year smoking history. He has never used smokeless tobacco. He reports that  drinks alcohol. He reports that  he uses illicit drugs.  Family / Support Systems Marital Status: Married How Long?: 7 yrs  (Pt's 6th wife) Patient Roles: Spouse Spouse/Significant Other: Jeremy [name] @ (C) [phone] or [phone]) [phone] ext [phone] Children: neither pt nor wife have any children Other Supports: no other family in area Anticipated Caregiver: wife, Jeremy [name] Ability/Limitations of Caregiver: Min A;  also she does work full-time and can only take a brief time away from work Medical laboratory scientific officer: 24/7 (initially-then intermittent) Family Dynamics: pt reports he has a stable marriage and no concern about his wife's level of support once he is d/c'd  Social History Preferred language: English Religion: Christian Cultural Background:  NA Education: AA degree Read: Yes Write: Yes Employment Status: Disabled Date Retired/Disabled/Unemployed: 2008 (PVD and COPD) Legal Hisotry/Current Legal Issues: none   Abuse/Neglect Physical Abuse: Denies Verbal Abuse: Denies Sexual Abuse: Denies Exploitation of patient/patient's resources: Denies Self-Neglect: Denies  Emotional Status Pt's affect, behavior adn adjustment status: Pt pleasant, talkative and fully oriented.  Notes he feels he was mentally "prepared" for BKA as this was a possibilty for some time.  Denies any emotional distress "...no I think I'm doing pretty good..." and no s/s of depression or anxiety. No issues with pain management either.  Have offered peer support should he be interested Recent Psychosocial Issues: None Pyschiatric History: None Substance Abuse History: None  Patient / Family Perceptions, Expectations & Goals Pt/Family understanding of illness & functional limitations: Pt with good understaning of medical issues that have ultimatel led up to BKA and of his current functional limitation and need for CIR.   Premorbid pt/family roles/activities: Pt fairly limited physically PTA with limited household and community mobility beyond just MD appointments Anticipated changes in roles/activities/participation: Little change initially but hopefully will resume a more mobile lifestyle once he is fitted with prosthesis Pt/family expectations/goals: "I need to get to where I can take care of myself"  [employer]: None Premorbid Home Care/DME Agencies: Other (Comment) ([facility] (wound vac) and [facility] for RN) Transportation available at discharge: yes Resource referrals recommended: Support group (specify) (amputee support group)  Discharge Planning Living Arrangements: Spouse/significant other Support Systems: Spouse/significant other Type of Residence: Private residence Insurance Resources: Administrator (specify)  Herbalist) Financial Resources: Tree surgeon (Comment) Horticulturist, commercial retirement) Surveyor, quantity Screen Referred: No Living Expenses: Database administrator Management: Patient Does the patient have any problems obtaining your medications?: No Home Management: wife Patient/Family Preliminary Plans: home with wife who can provide 24/7 assistance initially Social Work Anticipated Follow Up Needs: HH/OP;Support Group Expected length of stay: @ 10 days  Clinical Impression Pleasant, oriented gentleman here after BKA after long term attempts to salvage.  Denies any significant emotional distress and made aware of support services available.  Good but time - limited support from spouse.  Will follow for support and d/c planning needs.  Consandra Laske 11/30/2012, 6:36 PM

## 2012-11-30 NOTE — Progress Notes (Signed)
Physical Therapy Session Note  Patient Details  Name: Jeremy [name] MRN: [id number] Date of Birth: Aug 19, 1950  Today's Date: 11/30/2012 Time: 0832-0930 Time Calculation (min): 58 min  Short Term Goals: Week 1:  PT Short Term Goal 1 (Week 1): = LTGs  Skilled Therapeutic Interventions/Progress Updates:    Session focused on overall activity tolerance, squat pivot transfers with focus on w/c parts management/set-up and technique, dynamic standing balance activities to work on balance reactions and reaching outside BOS, w/c mobility for endurance and strengthening, and sit to stands/short distance gait. Pt limited by fatigue/sickness this session and declined gait training this AM. Returned to bed end of session to rest with min A for squat pivot transfer from w/c.  Therapy Documentation Precautions:  Precautions Precautions: Fall Precaution Comments: Chronic O2 at 3L (prior to admission) Restrictions Weight Bearing Restrictions: Yes LLE Weight Bearing: Non weight bearing  Pain: Premedicated for pain. Reports feeling like he has a cold and just fatigued.  See FIM for current functional status  Therapy/Group: Individual Therapy  [place], [facility] 11/30/2012, 9:31 AM

## 2012-12-01 ENCOUNTER — Inpatient Hospital Stay (HOSPITAL_COMMUNITY): Payer: Medicare Other

## 2012-12-01 ENCOUNTER — Inpatient Hospital Stay (HOSPITAL_COMMUNITY): Payer: Medicare Other | Admitting: *Deleted

## 2012-12-01 ENCOUNTER — Inpatient Hospital Stay (HOSPITAL_COMMUNITY): Payer: Medicare Other | Admitting: Occupational Therapy

## 2012-12-01 DIAGNOSIS — I739 Peripheral vascular disease, unspecified: Secondary | ICD-10-CM

## 2012-12-01 DIAGNOSIS — L98499 Non-pressure chronic ulcer of skin of other sites with unspecified severity: Secondary | ICD-10-CM

## 2012-12-01 DIAGNOSIS — S88119A Complete traumatic amputation at level between knee and ankle, unspecified lower leg, initial encounter: Secondary | ICD-10-CM

## 2012-12-01 LAB — GLUCOSE, CAPILLARY: Glucose-Capillary: 99 mg/dL (ref 70–99)

## 2012-12-01 LAB — PROTIME-INR: INR: 2.37 — ABNORMAL HIGH (ref 0.00–1.49)

## 2012-12-01 MED ORDER — WARFARIN SODIUM 7.5 MG PO TABS
7.5000 mg | ORAL_TABLET | Freq: Once | ORAL | Status: AC
  Administered 2012-12-01: 7.5 mg via ORAL
  Filled 2012-12-01: qty 1

## 2012-12-01 NOTE — Progress Notes (Signed)
[facility] Individual Statement of Services  Patient Name:  Jeremy [name]  Date:  12/01/2012  Welcome to the [facility].  Our goal is to provide you with an individualized program based on your diagnosis and situation, designed to meet your specific needs.  With this comprehensive rehabilitation program, you will be expected to participate in at least 3 hours of rehabilitation therapies Monday-Friday, with modified therapy programming on the weekends.  Your rehabilitation program will include the following services:  Physical Therapy (PT), Occupational Therapy (OT), 24 hour per day rehabilitation nursing, Therapeutic Recreaction (TR), Psychology, Case Management (Social Worker), Rehabilitation Medicine, Nutrition Services and Pharmacy Services  Weekly team conferences will be held on Tuesdays to discuss your progress.  Your Social Worker will talk with you frequently to get your input and to update you on team discussions.  Team conferences with you and your family in attendance may also be held.  Expected length of stay: 2 weeks  Overall anticipated outcome: modified independent  Depending on your progress and recovery, your program may change. Your Social Worker will coordinate services and will keep you informed of any changes. Your Social Worker's name and contact numbers are listed  below.  The following services may also be recommended but are not provided by the [facility]:   Driving Evaluations  Home Health Rehabiltiation Services  Outpatient Rehabilitation Services  Peer visits    Arrangements will be made to provide these services after discharge if needed.  Arrangements include referral to agencies that provide these services.  Your insurance has been verified to be:  Medicare and BCBS Your primary doctor is:  Dr. Oscar La of [place]  Pertinent information will be shared with your doctor and your insurance  company.  Social Worker:  [place], [place] [phone] or [phone]   Information discussed with and copy given to patient by: [name], 12/01/2012, 10:38 PM

## 2012-12-01 NOTE — Progress Notes (Signed)
Patient ID: Jeremy [name], male   DOB: August 05, 1950, 62 y.o.   MRN: [id number] Subjective/Complaints: Pain improving. Slept quite well last night. Uses oxygen at home--breathing at baseline. Review of Systems  Musculoskeletal: Positive for joint pain.  Neurological: Positive for sensory change.  All other systems reviewed and are negative.   Objective: Vital Signs: Blood pressure 110/68, pulse 55, temperature 98.1 F (36.7 C), temperature source Oral, resp. rate 18, height 5\' 10"  (1.778 m), weight 122.471 kg (270 lb), SpO2 100.00%. Dg Chest 2 View  11/30/2012   *RADIOLOGY REPORT*  Clinical Data: Cough, shortness of breath  CHEST - 2 VIEW  Comparison: 11/16/2012  Findings: Cardiomegaly again noted.  No pulmonary edema.  A spinal stimulation wire lower thoracic spine again noted. No segmental infiltrate or pleural effusion.  Mild perihilar increased bronchial markings without focal consolidation.  Bony thorax is stable.  IMPRESSION: No acute infiltrate or pulmonary edema.  Mild perihilar increased bronchial markings without focal consolidation.   Original Report Authenticated By: Natasha Mead, M.D.   Results for orders placed during the hospital encounter of 11/27/12 (from the past 72 hour(s))  PROTIME-INR     Status: Abnormal   Collection Time    11/29/12  6:20 AM      Result Value Range   Prothrombin Time 20.5 (*) 11.6 - 15.2 seconds   INR 1.82 (*) 0.00 - 1.49  CBC WITH DIFFERENTIAL     Status: Abnormal   Collection Time    11/30/12  5:00 AM      Result Value Range   WBC 3.9 (*) 4.0 - 10.5 K/uL   RBC 3.06 (*) 4.22 - 5.81 MIL/uL   Hemoglobin 9.6 (*) 13.0 - 17.0 g/dL   HCT 40.1 (*) 02.7 - 25.3 %   MCV 94.8  78.0 - 100.0 fL   MCH 31.4  26.0 - 34.0 pg   MCHC 33.1  30.0 - 36.0 g/dL   RDW 66.4  40.3 - 47.4 %   Platelets 173  150 - 400 K/uL   Neutrophils Relative % 44  43 - 77 %   Neutro Abs 1.7  1.7 - 7.7 K/uL   Lymphocytes Relative 27  12 - 46 %   Lymphs Abs 1.1  0.7 - 4.0 K/uL   Monocytes  Relative 24 (*) 3 - 12 %   Monocytes Absolute 1.0  0.1 - 1.0 K/uL   Eosinophils Relative 3  0 - 5 %   Eosinophils Absolute 0.1  0.0 - 0.7 K/uL   Basophils Relative 1  0 - 1 %   Basophils Absolute 0.0  0.0 - 0.1 K/uL  COMPREHENSIVE METABOLIC PANEL     Status: Abnormal   Collection Time    11/30/12  5:00 AM      Result Value Range   Sodium 141  135 - 145 mEq/L   Potassium 3.7  3.5 - 5.1 mEq/L   Chloride 103  96 - 112 mEq/L   CO2 29  19 - 32 mEq/L   Glucose, Bld 98  70 - 99 mg/dL   BUN 22  6 - 23 mg/dL   Creatinine, Ser 2.59  0.50 - 1.35 mg/dL   Calcium 8.6  8.4 - 56.3 mg/dL   Total Protein 5.8 (*) 6.0 - 8.3 g/dL   Albumin 2.4 (*) 3.5 - 5.2 g/dL   AST 15  0 - 37 U/L   ALT 10  0 - 53 U/L   Alkaline Phosphatase 75  39 - 117 U/L  Total Bilirubin 0.3  0.3 - 1.2 mg/dL   GFR calc non Af Amer 87 (*) >90 mL/min   GFR calc Af Amer >90  >90 mL/min   Comment:            The eGFR has been calculated     using the CKD EPI equation.     This calculation has not been     validated in all clinical     situations.     eGFR's persistently     <90 mL/min signify     possible Chronic Kidney Disease.  PROTIME-INR     Status: Abnormal   Collection Time    11/30/12  5:00 AM      Result Value Range   Prothrombin Time 21.4 (*) 11.6 - 15.2 seconds   INR 1.92 (*) 0.00 - 1.49  GLUCOSE, CAPILLARY     Status: None   Collection Time    11/30/12  7:34 AM      Result Value Range   Glucose-Capillary 92  70 - 99 mg/dL   Comment 1 Notify RN    GLUCOSE, CAPILLARY     Status: None   Collection Time    11/30/12 11:48 AM      Result Value Range   Glucose-Capillary 95  70 - 99 mg/dL   Comment 1 Notify RN    GLUCOSE, CAPILLARY     Status: Abnormal   Collection Time    11/30/12  4:32 PM      Result Value Range   Glucose-Capillary 103 (*) 70 - 99 mg/dL   Comment 1 Notify RN    GLUCOSE, CAPILLARY     Status: Abnormal   Collection Time    11/30/12  8:41 PM      Result Value Range   Glucose-Capillary  106 (*) 70 - 99 mg/dL   Comment 1 Notify RN    PROTIME-INR     Status: Abnormal   Collection Time    12/01/12  6:16 AM      Result Value Range   Prothrombin Time 25.1 (*) 11.6 - 15.2 seconds   INR 2.37 (*) 0.00 - 1.49  GLUCOSE, CAPILLARY     Status: None   Collection Time    12/01/12  7:27 AM      Result Value Range   Glucose-Capillary 99  70 - 99 mg/dL    Physical exam:  General: Alert and oriented x 3, No apparent distress HEENT: Head is normocephalic, atraumatic, PERRLA, EOMI, sclera anicteric, oral mucosa pink and moist, dentition intact, ext ear canals clear,  Neck: Supple without JVD or lymphadenopathy Heart: Reg rate and rhythm. No murmurs rubs or gallops Chest: CTA bilaterally without wheezes, rales, or rhonchi; no distress Abdomen: Soft, non-tender, non-distended, bowel sounds positive. Extremities: No clubbing, cyanosis, or edema. Pulses are 2+ Skin: left leg in immediate post-op dressing--no bleed through or drainage noted Neuro: Pt is cognitively appropriate with normal insight, memory, and awareness. Cranial nerves 2-12 are intact. Distal loss of FT/PP. Reflexes are 1+ in all 4's. Fine motor coordination is intact. No tremors. Motor function is grossly 5/5 except for amputated leg Musculoskeletal: Full ROM, No pain with AROM or PROM in the neck, trunk, or extremities. Posture appropriate Psych: Pt's affect is appropriate. Pt is cooperative       Assessment/Plan: 1. Functional deficits secondary to left BKA which require 3+ hours per day of interdisciplinary therapy in a comprehensive inpatient rehab setting. Physiatrist is providing close team supervision and  24 hour management of active medical problems listed below. Physiatrist and rehab team continue to assess barriers to discharge/monitor patient progress toward functional and medical goals.  Has immediate post-op dressing--continue for now.   FIM: FIM - Bathing Bathing Steps Patient Completed: Chest;Right  Arm;Left Arm;Abdomen;Front perineal area;Right upper leg;Left upper leg Bathing: 4: Min-Patient completes 8-9 84f 10 parts or 75+ percent (7/9)  FIM - Upper Body Dressing/Undressing Upper body dressing/undressing steps patient completed: Thread/unthread right sleeve of pullover shirt/dresss;Thread/unthread left sleeve of pullover shirt/dress;Put head through opening of pull over shirt/dress;Pull shirt over trunk Upper body dressing/undressing: 5: Set-up assist to: Obtain clothing/put away FIM - Lower Body Dressing/Undressing Lower body dressing/undressing steps patient completed: Thread/unthread right pants leg;Thread/unthread left pants leg;Pull pants up/down;Don/Doff right shoe;Fasten/unfasten right shoe Lower body dressing/undressing: 5: Set-up assist to: Obtain clothing (supine in bed)  FIM - Toileting Toileting: 0: Activity did not occur  FIM - Diplomatic Services operational officer Devices: Therapist, music Transfers: 0-Activity did not occur  FIM - Banker Devices: Arm rests Bed/Chair Transfer: 5: Supine > Sit: Supervision (verbal cues/safety issues);5: Bed > Chair or W/C: Supervision (verbal cues/safety issues)  FIM - Locomotion: Wheelchair Distance: 50' Locomotion: Wheelchair: 5: Travels 150 ft or more: maneuvers on rugs and over door sills with supervision, cueing or coaxing FIM - Locomotion: Ambulation Locomotion: Ambulation Assistive Devices: Designer, industrial/product Ambulation/Gait Assistance: 4: Min assist Locomotion: Ambulation: 1: Travels less than 50 ft with minimal assistance (Pt.>75%)  Comprehension Comprehension Mode: Auditory Comprehension: 5-Follows basic conversation/direction: With no assist  Expression Expression Mode: Verbal Expression: 5-Expresses basic needs/ideas: With no assist  Social Interaction Social Interaction: 5-Interacts appropriately 90% of the time - Needs monitoring or encouragement for participation or  interaction.  Problem Solving Problem Solving: 5-Solves basic problems: With no assist  Memory Memory: 5-Recognizes or recalls 90% of the time/requires cueing < 10% of the time  Medical Problem List and Plan:  1. DVT Prophylaxis/Anticoagulation: Pharmaceutical: Coumadin  2. Chronic Pain Management: Has spinal cord stimulator. Continue Fentanyl patch, OxyContin, Oxycodone, Topamax and Cymbalta. As pain subsides may be able to adjust medications 3. Mood: Has realist goals and reasonable outlook overall. Will have LCSW follow for evaluation.  4. Neuropsych: This patient is capable of making decisions on his own behalf.  5. ABLA on chronic anemia: Fe supplement. 9.6 6. O2 dependent/OSA: Encouraged to wear BIPAP at nights to help with sleep hygiene as well as energy levels. Continue oxygen as needed. 7. Morbid Obesity: s/p stomach stapling. Used to weigh 450+ lbs. Pressure relief measures. Will consult dietician to assist with nutritional needs.  8. DM type 2: diet controlled at present 9. Hypokalemia: Increased supplement to bid and levels back to normal 10. COPD: continue Singulair and Dulera. Monitor respiratory status with increase in activity, has increasing cough, lungs are clear but given history will start antibiotics  LOS (Days) 4 A FACE TO FACE EVALUATION WAS PERFORMED  Arnett Duddy T 12/01/2012, 8:13 AM

## 2012-12-01 NOTE — Progress Notes (Signed)
S:Patient with complains of malaise.  O:  Sounds more congested nasally. Exam: Lungs:  with upper airway wheezes.  A/P 1. URI: Non-productive cough.  Will schedule nebs qid for 48 hours to help with symptoms. Encourage IS with flutter valve. Push fluids. Continue Levaquin. (Wife with recent URI/chills--was in room with him post op)

## 2012-12-01 NOTE — Progress Notes (Signed)
Occupational Therapy Session Notes  Patient Details  Name: Jeremy [name] MRN: [id number] Date of Birth: 08-15-1950  Today's Date: 12/01/2012  Short Term Goals: Week 1:  OT Short Term Goal 1 (Week 1): STGs = LTGs based on LOS  Skilled Therapeutic Interventions/Progress Updates:   Session #1 [phone] - 40 Minutes Missed 20 minutes of skilled OT secondary to refusal secondary to "not feeling well" Individual Therapy No complaints of pain, but patient with complaints of a head cold, "I feel like my head is going to explode" Upon entering room, patient found seated edge of bed with RN present administering medications. Therapist discussed patient's bathing routine at home, patient stated he took ~3 showers over the past 2 years. Patient stated he mainly "sponge bathed" in his bathroom in a sit<>stand position. Patient stayed sitting edge of bed and completed UB bathing. Patient then laid supine in bed for doffing/donning of pants. After donning pants, patient stated he couldn't participate in therapy anymore secondary to being sick. Therapist notified RN of patient's complaints and missed therapy time.   Session #2 [id number] - 40 Minutes Individual Therapy No complaints of pain Upon entering room patient found supine in bed, in same position therapist left patient at 0815. With minimal encouragement patient engaged in bed mobility and sat edge of bed. Patient donned shirt, then engaged in BUE theraband strengthening exercises. Patient able to recall exercises taught yesterday with min verbal cues. Set a LTG for a HEP. Again, discussed D/C planning with patient. Patient states he wants to go home by the end of the week and that he would be okay at a w/c level as opposed to walking in the house by himself. At end of session, left patient supine in bed with call bell & phone within reach. Encouraged patient to sit up for meals.   Precautions:  Precautions Precautions: Fall Precaution Comments:  Chronic O2 at 3L (prior to admission) Restrictions Weight Bearing Restrictions: Yes LLE Weight Bearing: Non weight bearing  See FIM for current functional status  Navy Belay 12/01/2012, 7:35 AM

## 2012-12-01 NOTE — Progress Notes (Signed)
Physical Therapy Session Note  Patient Details  Name: Jeremy [name] MRN: [id number] Date of Birth: 10-12-1950  Today's Date: 12/01/2012 Time: 1300-1330 Time Calculation (min): 30 min  Short Term Goals: Week 1:  PT Short Term Goal 1 (Week 1): = LTGs  Skilled Therapeutic Interventions/Progress Updates:    Session focused on discussion of d/c planning as patient expressed to primary OT request to go home end of week with pt wanting to be able to walk mod I in the house with RW (wife works all day). Discussed recommendation that by end of week pt would need someone with him during gait but if stay through part of next week may be able to get to a short (about 15-20') distance gait mod I for patient to be able to go into his bathroom. Also due to limited participation with therapy today and yesterday (declining gait due to his cold), discussed with patient that he hasn't really walked in 2 days and required min A over the weekend for 12' ambulation. Will discuss with primary team during team conference today.   Agreeable to complete seated therex EOB for last 10 minutes of session and 1 sit to stand with close S using RW for about 2 minutes. Returned to supine mod I in the bed.  Therapy Documentation Precautions:  Precautions Precautions: Fall Precaution Comments: Chronic O2 at 3L (prior to admission) Restrictions Weight Bearing Restrictions: Yes LLE Weight Bearing: Non weight bearing  Pain: Premedicated for pain in residual limb. Main complaint is still of feeling "sick and weak"  See FIM for current functional status  Therapy/Group: Individual Therapy  [name], [name] 12/01/2012, 2:19 PM

## 2012-12-01 NOTE — Progress Notes (Signed)
ANTICOAGULATION CONSULT NOTE - Follow Up Consult  Pharmacy Consult for Coumadin Indication: PVD, history of DVTs  No Known Allergies  Patient Measurements: Height: 5\' 10"  (177.8 cm) Weight: 270 lb (122.471 kg) IBW/kg (Calculated) : 73 Heparin Dosing Weight:   Vital Signs: Temp: 97.6 F (36.4 C) (08/12 0940) Temp src: Oral (08/12 0940) BP: 110/65 mmHg (08/12 0833) Pulse Rate: 72 (08/12 0833)  Labs:  Recent Labs  11/29/12 0620 11/30/12 0500 12/01/12 0616  HGB  --  9.6*  --   HCT  --  29.0*  --   PLT  --  173  --   LABPROT 20.5* 21.4* 25.1*  INR 1.82* 1.92* 2.37*  CREATININE  --  0.97  --     Estimated Creatinine Clearance: 105 ml/min (by C-G formula based on Cr of 0.97).   Medications:  Scheduled:  . dextromethorphan  15 mg Oral BID  . DULoxetine  30 mg Oral Daily  . feeding supplement  30 mL Oral BID  . fentaNYL  25 mcg Transdermal Q72H  . ferrous sulfate  325 mg Oral TID WC  . fluocinonide-emollient   Topical TID  . fluticasone  1 spray Each Nare Daily  . furosemide  40 mg Oral Q1500  . levofloxacin  750 mg Oral Daily  . mometasone-formoterol  2 puff Inhalation BID  . montelukast  10 mg Oral QHS  . OxyCODONE  15 mg Oral Q12H  . potassium chloride SA  20 mEq Oral BID  . simvastatin  5 mg Oral q1800  . sodium chloride  1 spray Each Nare BID  . topiramate  100 mg Oral BID  . warfarin  7.5 mg Oral ONCE-1800  . 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  INR 2.37 today. No new CBC, no bleeding noted. Continues on levaquin which may increase the INR.    Goal of Therapy:  INR 2-3   Plan:  1. Warfarin 7.5mg  PO x 1 tonight 2. F/u AM INR  Lysle Pearl, PharmD, BCPS Pager # [phone] 12/01/2012 9:49 AM

## 2012-12-01 NOTE — Progress Notes (Signed)
Physical Therapy Note  Patient Details  Name: RICE WALSH MRN: [id number] Date of Birth: 06-13-1950 Today's Date: 12/01/2012  Pt missed 60 minutes of skilled PT due to c/o feeling sick and fatigued from a cold. Declined OOB or therapy in the room. RN aware and notified for pain medication. Will attempt to make up minutes as able.   Juancarlos, Crescenzo [facility] 12/01/2012, 8:35 AM

## 2012-12-01 NOTE — Progress Notes (Signed)
Physical Therapy Session Note  Patient Details  Name: Jeremy [name] MRN: [id number] Date of Birth: September 26, 1950  Today's Date: 12/01/2012 Time: 1440-1510 Time Calculation (min): 30 min  Short Term Goals: Week 1:  PT Short Term Goal 1 (Week 1): = LTGs  Skilled Therapeutic Interventions/Progress Updates:    Made up 30 min of 60 missed minutes from AM session. Pt agreeable to gait training in the room x 8' x 2 with min A for sit to stand and for gait; cues for clearance of RLE (more push through UE's than scuffing foot on floor) for safety with extended seated rest break due to fatigue. Completed sit to stand from bed with RW with S but steady A needed from w/c. Discussed changing gait distance goal to equal about 15' which is what he would have to do at home and to start practicing walking into the bathroom with therapy and nursing staff to simulate home environment. Pt in agreement.   Therapy Documentation Precautions:  Precautions Precautions: Fall Precaution Comments: Chronic O2 at 3L (prior to admission) Restrictions Weight Bearing Restrictions: Yes LLE Weight Bearing: Non weight bearing Pain: Pain Assessment Pain Assessment: 0-10 Pain Score: 8  Pain Type: Surgical pain Pain Location:  (left residual limb) Pain Orientation: Left Pain Frequency: Intermittent Pain Onset: On-going Patients Stated Pain Goal: 3 Pain Intervention(s): Medication (See eMAR) Locomotion : Ambulation Ambulation/Gait Assistance: 4: Min assist   See FIM for current functional status  Therapy/Group: Individual Therapy  Chase, Arnall [facility] 12/01/2012, 3:15 PM

## 2012-12-02 ENCOUNTER — Inpatient Hospital Stay (HOSPITAL_COMMUNITY): Payer: Medicare Other

## 2012-12-02 ENCOUNTER — Inpatient Hospital Stay (HOSPITAL_COMMUNITY): Payer: Medicare Other | Admitting: Occupational Therapy

## 2012-12-02 ENCOUNTER — Ambulatory Visit (HOSPITAL_COMMUNITY): Payer: Medicare Other | Admitting: *Deleted

## 2012-12-02 DIAGNOSIS — L98499 Non-pressure chronic ulcer of skin of other sites with unspecified severity: Secondary | ICD-10-CM

## 2012-12-02 DIAGNOSIS — I739 Peripheral vascular disease, unspecified: Secondary | ICD-10-CM

## 2012-12-02 DIAGNOSIS — S88119A Complete traumatic amputation at level between knee and ankle, unspecified lower leg, initial encounter: Secondary | ICD-10-CM

## 2012-12-02 MED ORDER — PRO-STAT SUGAR FREE PO LIQD
30.0000 mL | Freq: Every day | ORAL | Status: DC
  Administered 2012-12-03 – 2012-12-07 (×5): 30 mL via ORAL
  Filled 2012-12-02 (×8): qty 30

## 2012-12-02 MED ORDER — WARFARIN SODIUM 7.5 MG PO TABS
7.5000 mg | ORAL_TABLET | Freq: Once | ORAL | Status: AC
  Administered 2012-12-02: 7.5 mg via ORAL
  Filled 2012-12-02: qty 1

## 2012-12-02 MED ORDER — ADULT MULTIVITAMIN W/MINERALS CH
1.0000 | ORAL_TABLET | Freq: Every day | ORAL | Status: DC
  Administered 2012-12-02 – 2012-12-09 (×8): 1 via ORAL
  Filled 2012-12-02 (×9): qty 1

## 2012-12-02 NOTE — Progress Notes (Signed)
NUTRITION FOLLOW UP  Intervention:   1.  General healthful diet; continue to encourage intake as needed. 2.  Meals/snacks; encourage low-moderate CHO snacks between meals such as sugar-free jello, yogurt or fruit with peanut butter. 3.  Supplements; Continue Prostat once daily. MVI daily.   Nutrition Dx:   Inadequate oral intake, ongoing.  Monitor:   1.  Food/Beverage; pt meeting >/=90% estimated needs with tolerance. 2.  Wt/wt change; monitor trends  Assessment:   Pt admitted to CIR s/p BKA.  Pt with h/o morbid obesity. 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 Pt has had his stomach stapled. Per chart review, pt highest wt is 340 lbs. 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  RD met with pt who reports poor intake PTA due to pain and limited mobility.  Pt has been able to improve intake to 100% of meals and is consuming some supplements.   Pt dietary recall from home includes 2 ham, egg, and cheese english muffins from meal ordering service and a dinner prepared by wife (sandwich, chicken, pizza, etc..).  Discussed home diet.  Encouraged variety.   Height: Ht Readings from Last 1 Encounters:  11/28/12 5\' 10"  (1.778 m)    Weight Status:   Wt Readings from Last 1 Encounters:  11/28/12 270 lb (122.471 kg)    Re-estimated needs:  Kcal: 2000-2150 Protein: 110-130g Fluid: >2.0 L/day  Skin: incision  Diet Order: Carb Control   Intake/Output Summary (Last 24 hours) at 12/02/12 1527 Last data filed at 12/02/12 1323  Gross per 24 hour  Intake    720 ml  Output   2550 ml  Net  -1830 ml    Last BM: 8/11  Labs:   Recent Labs Lab 11/30/12 0500  NA 141  K 3.7  CL 103  CO2 29  BUN 22  CREATININE 0.97  CALCIUM 8.6  GLUCOSE 98    CBG (last 3)   Recent Labs  12/01/12 0727 12/01/12 1124 12/01/12 1614  GLUCAP 99 89 93    Scheduled Meds: . dextromethorphan  15 mg Oral BID  . DULoxetine  30 mg Oral Daily  . feeding supplement  30 mL  Oral BID  . fentaNYL  25 mcg Transdermal Q72H  . ferrous sulfate  325 mg Oral TID WC  . fluocinonide-emollient   Topical TID  . furosemide  40 mg Oral Q1500  . levofloxacin  750 mg Oral Daily  . mometasone-formoterol  2 puff Inhalation BID  . montelukast  10 mg Oral QHS  . OxyCODONE  15 mg Oral Q12H  . potassium chloride SA  20 mEq Oral BID  . simvastatin  5 mg Oral q1800  . sodium chloride  1 spray Each Nare BID  . topiramate  100 mg Oral BID  . warfarin  7.5 mg Oral ONCE-1800  . Warfarin - Pharmacist Dosing Inpatient   Does not apply q1800    Continuous Infusions:   Loyce Dys, MS RD LDN Clinical Inpatient Dietitian Pager: [phone] Weekend/After hours pager: [phone]

## 2012-12-02 NOTE — Progress Notes (Signed)
Physical Therapy Session Note  Patient Details  Name: Jeremy [name] MRN: [id number] Date of Birth: 11/14/1950  Today's Date: 12/02/2012 Time: 1000-1030 Time Calculation (min): 30 min  Short Term Goals: Week 1:  PT Short Term Goal 1 (Week 1): = LTGs  Skilled Therapeutic Interventions/Progress Updates:    Co-treat with Recreational therapy with focus on functional transfers OOB, w/c mobility for endurance and strengthening, and simulated car transfer with RW (very close S). Pt requires extra time for all tasks.    Therapy Documentation Precautions:  Precautions Precautions: Fall Precaution Comments: Chronic O2 at 3L (prior to admission) Restrictions Weight Bearing Restrictions: Yes LLE Weight Bearing: Non weight bearing   Pain: Pain Assessment Pain Score: 8  Pain Type: Surgical pain Pain Location: Leg Pain Orientation: Left Pain Descriptors / Indicators: Aching;Discomfort;Throbbing Pain Onset: On-going Patients Stated Pain Goal: 2 Pain Intervention(s): Medication (See eMAR) Locomotion : Ambulation Ambulation/Gait Assistance: 4: Min assist   See FIM for current functional status  Therapy/Group: Individual Therapy and Co-Treatment with Recreational Therapy  Danyael, Alipio [facility] 12/02/2012, 12:23 PM

## 2012-12-02 NOTE — Progress Notes (Signed)
Recreational Therapy Session Note  Patient Details  Name: PATRIK TURNBAUGH MRN: [id number] Date of Birth: 12-13-1950 Today's Date: 12/02/2012 Time:  01-1029 Pain: no c/o Skilled Therapeutic Interventions/Progress Updates: Session focused on activity tolerance, transfers, w/c mobility, ambulation using RW, car transfer in preparation for community pursuits.  Pt requires extra time but is able to complete tasks with close supervision.  Therapy/Group: Co-Treatment   Cleotha Tsang 12/02/2012, 2:27 PM

## 2012-12-02 NOTE — Progress Notes (Signed)
Patient ID: Jeremy [name], male   DOB: 1951-04-14, 62 y.o.   MRN: [id number] Subjective/Complaints: Still complains of head/sinus congestion. Denies problems with breathing. Denies fever/ chills. Pain in leg generally improved.. Review of Systems  Musculoskeletal: Positive for joint pain.  Neurological: Positive for sensory change.  All other systems reviewed and are negative.   Objective: Vital Signs: Blood pressure 110/57, pulse 51, temperature 97.7 F (36.5 C), temperature source Oral, resp. rate 19, height 5\' 10"  (1.778 m), weight 122.471 kg (270 lb), SpO2 97.00%. Dg Chest 2 View  11/30/2012   *RADIOLOGY REPORT*  Clinical Data: Cough, shortness of breath  CHEST - 2 VIEW  Comparison: 11/16/2012  Findings: Cardiomegaly again noted.  No pulmonary edema.  A spinal stimulation wire lower thoracic spine again noted. No segmental infiltrate or pleural effusion.  Mild perihilar increased bronchial markings without focal consolidation.  Bony thorax is stable.  IMPRESSION: No acute infiltrate or pulmonary edema.  Mild perihilar increased bronchial markings without focal consolidation.   Original Report Authenticated By: Natasha Mead, M.D.   Results for orders placed during the hospital encounter of 11/27/12 (from the past 72 hour(s))  CBC WITH DIFFERENTIAL     Status: Abnormal   Collection Time    11/30/12  5:00 AM      Result Value Range   WBC 3.9 (*) 4.0 - 10.5 K/uL   RBC 3.06 (*) 4.22 - 5.81 MIL/uL   Hemoglobin 9.6 (*) 13.0 - 17.0 g/dL   HCT 91.4 (*) 78.2 - 95.6 %   MCV 94.8  78.0 - 100.0 fL   MCH 31.4  26.0 - 34.0 pg   MCHC 33.1  30.0 - 36.0 g/dL   RDW 21.3  08.6 - 57.8 %   Platelets 173  150 - 400 K/uL   Neutrophils Relative % 44  43 - 77 %   Neutro Abs 1.7  1.7 - 7.7 K/uL   Lymphocytes Relative 27  12 - 46 %   Lymphs Abs 1.1  0.7 - 4.0 K/uL   Monocytes Relative 24 (*) 3 - 12 %   Monocytes Absolute 1.0  0.1 - 1.0 K/uL   Eosinophils Relative 3  0 - 5 %   Eosinophils Absolute 0.1  0.0 - 0.7  K/uL   Basophils Relative 1  0 - 1 %   Basophils Absolute 0.0  0.0 - 0.1 K/uL  COMPREHENSIVE METABOLIC PANEL     Status: Abnormal   Collection Time    11/30/12  5:00 AM      Result Value Range   Sodium 141  135 - 145 mEq/L   Potassium 3.7  3.5 - 5.1 mEq/L   Chloride 103  96 - 112 mEq/L   CO2 29  19 - 32 mEq/L   Glucose, Bld 98  70 - 99 mg/dL   BUN 22  6 - 23 mg/dL   Creatinine, Ser 4.69  0.50 - 1.35 mg/dL   Calcium 8.6  8.4 - 62.9 mg/dL   Total Protein 5.8 (*) 6.0 - 8.3 g/dL   Albumin 2.4 (*) 3.5 - 5.2 g/dL   AST 15  0 - 37 U/L   ALT 10  0 - 53 U/L   Alkaline Phosphatase 75  39 - 117 U/L   Total Bilirubin 0.3  0.3 - 1.2 mg/dL   GFR calc non Af Amer 87 (*) >90 mL/min   GFR calc Af Amer >90  >90 mL/min   Comment:  The eGFR has been calculated     using the CKD EPI equation.     This calculation has not been     validated in all clinical     situations.     eGFR's persistently     <90 mL/min signify     possible Chronic Kidney Disease.  PROTIME-INR     Status: Abnormal   Collection Time    11/30/12  5:00 AM      Result Value Range   Prothrombin Time 21.4 (*) 11.6 - 15.2 seconds   INR 1.92 (*) 0.00 - 1.49  GLUCOSE, CAPILLARY     Status: None   Collection Time    11/30/12  7:34 AM      Result Value Range   Glucose-Capillary 92  70 - 99 mg/dL   Comment 1 Notify RN    GLUCOSE, CAPILLARY     Status: None   Collection Time    11/30/12 11:48 AM      Result Value Range   Glucose-Capillary 95  70 - 99 mg/dL   Comment 1 Notify RN    GLUCOSE, CAPILLARY     Status: Abnormal   Collection Time    11/30/12  4:32 PM      Result Value Range   Glucose-Capillary 103 (*) 70 - 99 mg/dL   Comment 1 Notify RN    GLUCOSE, CAPILLARY     Status: Abnormal   Collection Time    11/30/12  8:41 PM      Result Value Range   Glucose-Capillary 106 (*) 70 - 99 mg/dL   Comment 1 Notify RN    PROTIME-INR     Status: Abnormal   Collection Time    12/01/12  6:16 AM      Result Value  Range   Prothrombin Time 25.1 (*) 11.6 - 15.2 seconds   INR 2.37 (*) 0.00 - 1.49  GLUCOSE, CAPILLARY     Status: None   Collection Time    12/01/12  7:27 AM      Result Value Range   Glucose-Capillary 99  70 - 99 mg/dL  GLUCOSE, CAPILLARY     Status: None   Collection Time    12/01/12 11:24 AM      Result Value Range   Glucose-Capillary 89  70 - 99 mg/dL  GLUCOSE, CAPILLARY     Status: None   Collection Time    12/01/12  4:14 PM      Result Value Range   Glucose-Capillary 93  70 - 99 mg/dL  PROTIME-INR     Status: Abnormal   Collection Time    12/02/12  5:25 AM      Result Value Range   Prothrombin Time 27.0 (*) 11.6 - 15.2 seconds   INR 2.61 (*) 0.00 - 1.49    Physical exam:  General: Alert and oriented x 3, No apparent distress HEENT: Head is normocephalic, atraumatic, PERRLA, EOMI, sclera anicteric, oral mucosa pink and moist, dentition intact, ext ear canals clear,  Neck: Supple without JVD or lymphadenopathy Heart: Reg rate and rhythm. No murmurs rubs or gallops Chest: CTA bilaterally without wheezes, rales, or rhonchi; no distress Abdomen: Soft, non-tender, non-distended, bowel sounds positive. Extremities: No clubbing, cyanosis, or edema. Pulses are 2+ Skin: left leg in immediate post-op dressing--no bleed through or drainage noted Neuro: Pt is cognitively appropriate with normal insight, memory, and awareness. Cranial nerves 2-12 are intact. Distal loss of FT/PP. Reflexes are 1+ in all 4's. Fine motor  coordination is intact. No tremors. Motor function is grossly 5/5 except for amputated leg Musculoskeletal: Full ROM, No pain with AROM or PROM in the neck, trunk, or extremities. Posture appropriate Psych: Pt's affect is appropriate. Pt is cooperative       Assessment/Plan: 1. Functional deficits secondary to left BKA which require 3+ hours per day of interdisciplinary therapy in a comprehensive inpatient rehab setting. Physiatrist is providing close team  supervision and 24 hour management of active medical problems listed below. Physiatrist and rehab team continue to assess barriers to discharge/monitor patient progress toward functional and medical goals.  Has immediate post-op dressing--continue for now. Remove tomorrow?   FIM: FIM - Bathing Bathing Steps Patient Completed: Chest;Right Arm;Left Arm;Abdomen Bathing: 5: Supervision: Safety issues/verbal cues (UB bathing only)  FIM - Upper Body Dressing/Undressing Upper body dressing/undressing steps patient completed: Thread/unthread right sleeve of pullover shirt/dresss;Thread/unthread left sleeve of pullover shirt/dress;Put head through opening of pull over shirt/dress;Pull shirt over trunk Upper body dressing/undressing: 0: Activity did not occur FIM - Lower Body Dressing/Undressing Lower body dressing/undressing steps patient completed: Thread/unthread right pants leg;Thread/unthread left pants leg;Pull pants up/down Lower body dressing/undressing: 5: Supervision: Safety issues/verbal cues (pants only)  FIM - Toileting Toileting steps completed by patient: Adjust clothing prior to toileting;Performs perineal hygiene;Adjust clothing after toileting Toileting: 4: Steadying assist  FIM - Diplomatic Services operational officer Devices: Grab bars;Elevated toilet seat Toilet Transfers: 4-To toilet/BSC: Min A (steadying Pt. > 75%);4-From toilet/BSC: Min A (steadying Pt. > 75%)  FIM - Banker Devices: Walker;Arm rests Bed/Chair Transfer: 6: Supine > Sit: No assist;6: Sit > Supine: No assist;4: Chair or W/C > Bed: Min A (steadying Pt. > 75%);4: Bed > Chair or W/C: Min A (steadying Pt. > 75%)  FIM - Locomotion: Wheelchair Distance: 50' Locomotion: Wheelchair: 5: Travels 150 ft or more: maneuvers on rugs and over door sills with supervision, cueing or coaxing FIM - Locomotion: Ambulation Locomotion: Ambulation Assistive Devices: Dealer Ambulation/Gait Assistance: 4: Min assist Locomotion: Ambulation: 1: Travels less than 50 ft with minimal assistance (Pt.>75%)  Comprehension Comprehension Mode: Auditory Comprehension: 6-Follows complex conversation/direction: With extra time/assistive device  Expression Expression Mode: Verbal Expression: 6-Expresses complex ideas: With extra time/assistive device  Social Interaction Social Interaction: 6-Interacts appropriately with others with medication or extra time (anti-anxiety, antidepressant).  Problem Solving Problem Solving: 6-Solves complex problems: With extra time  Memory Memory: 7-Complete Independence: No helper  Medical Problem List and Plan:  1. DVT Prophylaxis/Anticoagulation: Pharmaceutical: Coumadin  2. Chronic Pain Management: Has spinal cord stimulator. Continue Fentanyl patch, OxyContin, Oxycodone, Topamax and Cymbalta. As pain subsides may be able to adjust medications 3. Mood: Has realist goals and reasonable outlook overall. Will have LCSW follow for evaluation.  4. Neuropsych: This patient is capable of making decisions on his own behalf.  5. ABLA on chronic anemia: Fe supplement. 9.6 6. O2 dependent/OSA: Encouraged to wear BIPAP at nights to help with sleep hygiene as well as energy levels. Continue oxygen as needed. 7. Morbid Obesity: s/p stomach stapling. Used to weigh 450+ lbs. Pressure relief measures. Will consult dietician to assist with nutritional needs.  8. DM type 2: diet controlled at present 9. Hypokalemia: Increased supplement to bid and levels back to normal 10. COPD: continue Singulair and Dulera. Nebs, nasal sprays initiated in addition to delsym  -chest remains clear   -encouraged pt to work through these symptom, the activity to should help improve clearance if anything.  LOS (Days) 5  A FACE TO FACE EVALUATION WAS PERFORMED  Emelyn Roen T 12/02/2012, 8:47 AM

## 2012-12-02 NOTE — Progress Notes (Signed)
Occupational Therapy Session Note  Patient Details  Name: Jeremy [name] MRN: [id number] Date of Birth: 11-02-1950  Today's Date: 12/02/2012 Time: 4098-1191 Time Calculation (min): 40 min  Short Term Goals: Week 1:  OT Short Term Goal 1 (Week 1): STGs = LTGs based on LOS  Skilled Therapeutic Interventions/Progress Updates:    Pt seated in w/c upon arrival.  Pt stated his LLE was hurting (6/10) and he needed to complete LB bathing at bed level.  Pt had completed UB bathing and dressing seated in w/c in earlier therapy.  Pt performed stand pivot transfer with RW to bed to complete LB bathing and dressing tasks.  Pt stated his LLE hurt too much to get back into w/c but agreed to engage in BUE therex with theraband.  Pt independently completed exercises previously learned.  Discussed discharge plans.  Pt required extra time to complete all tasks.  Therapy Documentation Precautions:  Precautions Precautions: Fall Precaution Comments: Chronic O2 at 3L (prior to admission) Restrictions Weight Bearing Restrictions: Yes LLE Weight Bearing: Non weight bearing Pain: Pain Assessment Pain Score: 7  Pain Type: Surgical pain Pain Location: Leg Pain Orientation: Left Pain Descriptors / Indicators: Aching;Throbbing;Sharp Pain Onset: On-going Patients Stated Pain Goal: 2 Pain Intervention(s): RN made aware;Repositioned  See FIM for current functional status  Therapy/Group: Individual Therapy  Rich Brave 12/02/2012, 10:02 AM

## 2012-12-02 NOTE — Progress Notes (Signed)
ANTICOAGULATION CONSULT NOTE - Follow Up Consult  Pharmacy Consult for Coumadin Indication: PVD, history of DVTs  No Known Allergies  Patient Measurements: Height: 5\' 10"  (177.8 cm) Weight: 270 lb (122.471 kg) IBW/kg (Calculated) : 73  Vital Signs: Temp: 97.7 F (36.5 C) (08/13 0530) Temp src: Oral (08/13 0530) BP: 110/57 mmHg (08/13 0530) Pulse Rate: 51 (08/13 0530)  Labs:  Recent Labs  11/30/12 0500 12/01/12 0616 12/02/12 0525  HGB 9.6*  --   --   HCT 29.0*  --   --   PLT 173  --   --   LABPROT 21.4* 25.1* 27.0*  INR 1.92* 2.37* 2.61*  CREATININE 0.97  --   --     Estimated Creatinine Clearance: 105 ml/min (by C-G formula based on Cr of 0.97).   Medications:  Scheduled:  . dextromethorphan  15 mg Oral BID  . DULoxetine  30 mg Oral Daily  . feeding supplement  30 mL Oral BID  . fentaNYL  25 mcg Transdermal Q72H  . ferrous sulfate  325 mg Oral TID WC  . fluocinonide-emollient   Topical TID  . furosemide  40 mg Oral Q1500  . levofloxacin  750 mg Oral Daily  . mometasone-formoterol  2 puff Inhalation BID  . montelukast  10 mg Oral QHS  . OxyCODONE  15 mg Oral Q12H  . potassium chloride SA  20 mEq Oral BID  . simvastatin  5 mg Oral q1800  . sodium chloride  1 spray Each Nare BID  . topiramate  100 mg Oral BID  . 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  INR 2.61 today. No new CBC, no bleeding noted. Continues on levaquin (8/10 - 8/18) which may increase the INR.    Goal of Therapy:  INR 2-3   Plan:  1. Warfarin 7.5mg  PO x 1 tonight 2. F/u AM INR  Bayard Hugger, PharmD, BCPS  Clinical Pharmacist  Pager: [phone]   12/02/2012 1:14 PM

## 2012-12-02 NOTE — Progress Notes (Signed)
Physical Therapy Session Note  Patient Details  Name: Jeremy [name] MRN: [id number] Date of Birth: 1950/11/26  Today's Date: 12/02/2012 Time: 1478-2956 Time Calculation (min): 56 min  Short Term Goals: Week 1:  PT Short Term Goal 1 (Week 1): = LTGs  Skilled Therapeutic Interventions/Progress Updates:    Session focused on gait training with RW (close S in straight line, steady A when navigating cones and turns) x 15' intervals, w/c propulsion on carpeted and tiled surfaces for endurance and home environment training, basic transfers with close S, and supine/sidelying therex for residual limb ROM/strengthening x 2 sets of 10 reps in each direction.   Therapy Documentation Precautions:  Precautions Precautions: Fall Precaution Comments: Chronic O2 at 3L (prior to admission) Restrictions Weight Bearing Restrictions: Yes LLE Weight Bearing: Non weight bearing   Pain: No complaints.  Locomotion : Ambulation Ambulation/Gait Assistance: 4: Min assist   See FIM for current functional status  Therapy/Group: Individual Therapy  Jeremy, [name] [facility] 12/02/2012, 4:06 PM

## 2012-12-02 NOTE — Progress Notes (Addendum)
Occupational Therapy Session Note  Patient Details  Name: Jeremy [name] MRN: [id number] Date of Birth: Aug 08, 1950  Today's Date: 12/02/2012 Time: 4098-1191 Time Calculation (min): 55 min  Short Term Goals: Week 1:  OT Short Term Goal 1 (Week 1): STGs = LTGs based on LOS  Skilled Therapeutic Interventions/Progress Updates:  Upon entering room, patient found supine in bed. Patient engaged in bed mobility to eat breakfast sitting edge of bed. RT came in with breathing treatment. After breakfast, patient ambulated from edge of bed -> bathroom with minimal assistance and engaged in toileting with steady assist. Recommend patient ambulate in/out of bathroom for simulation like at home. After toileting needs, patient ambulated back to w/c and sat at sink for UB bathing. Therapist left patient seated in w/c at sink for UB bathing & dressing and grooming tasks. Therapist recommended patient perform UB ADLs and wait for next therapist to complete LB ADLs. Left patient seated in w/c at sink with call bell & phone and clothes within reach.   Precautions:  Precautions Precautions: Fall Precaution Comments: Chronic O2 at 3L (prior to admission) Restrictions Weight Bearing Restrictions: Yes LLE Weight Bearing: Non weight bearing  See FIM for current functional status  Therapy/Group: Individual Therapy  Kohei Antonellis 12/02/2012, 8:33 AM

## 2012-12-03 ENCOUNTER — Inpatient Hospital Stay (HOSPITAL_COMMUNITY): Payer: Medicare Other

## 2012-12-03 ENCOUNTER — Inpatient Hospital Stay (HOSPITAL_COMMUNITY): Payer: Medicare Other | Admitting: Occupational Therapy

## 2012-12-03 LAB — PROTIME-INR
INR: 2.28 — ABNORMAL HIGH (ref 0.00–1.49)
Prothrombin Time: 24.4 seconds — ABNORMAL HIGH (ref 11.6–15.2)

## 2012-12-03 MED ORDER — OXYCODONE HCL 5 MG PO TABS
10.0000 mg | ORAL_TABLET | Freq: Once | ORAL | Status: AC
  Administered 2012-12-03: 10 mg via ORAL
  Filled 2012-12-03: qty 2

## 2012-12-03 MED ORDER — WARFARIN SODIUM 10 MG PO TABS
10.0000 mg | ORAL_TABLET | Freq: Once | ORAL | Status: AC
  Administered 2012-12-03: 10 mg via ORAL
  Filled 2012-12-03: qty 1

## 2012-12-03 NOTE — Progress Notes (Signed)
ANTICOAGULATION CONSULT NOTE - Follow Up Consult  Pharmacy Consult for Coumadin Indication: PVD, history of DVTs  No Known Allergies  Patient Measurements: Height: 5\' 10"  (177.8 cm) Weight: 233 lb 7.5 oz (105.9 kg) IBW/kg (Calculated) : 73  Vital Signs: Temp: 98.5 F (36.9 C) (08/14 0530) Temp src: Oral (08/14 0530) BP: 95/44 mmHg (08/14 0530) Pulse Rate: 51 (08/14 0530)  Labs:  Recent Labs  12/01/12 0616 12/02/12 0525 12/03/12 0655  LABPROT 25.1* 27.0* 24.4*  INR 2.37* 2.61* 2.28*    Estimated Creatinine Clearance: 97.5 ml/min (by C-G formula based on Cr of 0.97).   Medications:  Scheduled:  . dextromethorphan  15 mg Oral BID  . DULoxetine  30 mg Oral Daily  . feeding supplement  30 mL Oral Daily  . fentaNYL  25 mcg Transdermal Q72H  . ferrous sulfate  325 mg Oral TID WC  . fluocinonide-emollient   Topical TID  . furosemide  40 mg Oral Q1500  . levofloxacin  750 mg Oral Daily  . mometasone-formoterol  2 puff Inhalation BID  . montelukast  10 mg Oral QHS  . multivitamin with minerals  1 tablet Oral Daily  . OxyCODONE  15 mg Oral Q12H  . potassium chloride SA  20 mEq Oral BID  . simvastatin  5 mg Oral q1800  . sodium chloride  1 spray Each Nare BID  . topiramate  100 mg Oral BID  . 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  INR (2.28) is therapeutic today. No new CBC, no bleeding noted. Continues on levaquin (8/10 - 8/18) which may increase the INR.    Goal of Therapy:  INR 2-3   Plan:  1. Warfarin 10 mg PO x 1 tonight 2. F/u AM INR  Bayard Hugger, PharmD, BCPS  Clinical Pharmacist  Pager: [phone]   12/03/2012 1:03 PM

## 2012-12-03 NOTE — Progress Notes (Signed)
Patient refusing BiPAP at night.  Pt states that he is comfortable sleeping with 3LNC.  Pt told to let RT know if he changed his mind.

## 2012-12-03 NOTE — Progress Notes (Signed)
Occupational Therapy Session Notes  Patient Details  Name: Jeremy [name] MRN: [id number] Date of Birth: 1950-05-20  Today's Date: 12/03/2012  Short Term Goals: Week 1:  OT Short Term Goal 1 (Week 1): STGs = LTGs based on LOS  Skilled Therapeutic Interventions/Progress Updates:   Session #1 [phone] - 55 Minutes Individual Therapy Patient with complaints of pain in left residual limb, no rate given. Patient stated RN recently administered meds. Upon entering room, patient supine in bed. Patient engaged in bed mobility and sat edge of bed to eat breakfast. From here, patient refused to perform bathing & dressing at sink level; stating he didn't feel as tho he would be steady enough on his feet to get to the sink. Patient performed bathing and dressing from bed level in sit<>supine position. Patient refused to perform peri cleaning, asking therapist to assist with this task. Patient also stated his pain medications were making him feel "loopy".  Patient takes much more than reasonable amount of time to complete tasks. At end of session, left patient seated edge of bed donning shirt. Call bell & phone left within reach.   Session #2 [phone] - 25 Minutes Individual Therapy No complaints of pain Upon entering room patient found supine in bed. Patient engaged in bed mobility, sat edge of bed, then transferred into w/c. From here patient propelled self -> therapy gym for therapist to adjust right w/c brake. Patient then propelled self -> ADL apartment. In ADL apartment therapist and patient disussed simple meal prep and safety within kitchen, patient performed some simple tasks, such as opening & closing refrigerator door. From there patient transferred onto bed and engaged in bed mobility sit<>side lying. Patient able to perform transfer with close supervision and min verbal cues for safety with w/c set-up. Patient propelled self back to room and therapist left patient seated in w/c with call bell &  phone within reach.   Precautions:  Precautions Precautions: Fall Precaution Comments: Chronic O2 at 3L (prior to admission) Restrictions Weight Bearing Restrictions: Yes LLE Weight Bearing: Non weight bearing  See FIM for current functional status  [name] 12/03/2012, 7:24 AM

## 2012-12-03 NOTE — Progress Notes (Signed)
Physical Therapy Weekly Progress Note  Patient Details  Name: WEYLIN PLAGGE MRN: [id number] Date of Birth: October 17, 1950  Today's Date: 12/03/2012 Time: 1500-1600 Time Calculation (min): 60 min Individual therapy; Denies pain. Session focused on gait training for preparation for d/c, basic transfers using RW with emphasis on safe technique (cues to turn all the way around with RW when descending to chair), w/c propulsion and parts management, and dynamic standing balance/tolerance activity. Pt overall close S during this session with cues for safety. Measured width of w/c (27.5") to report to wife as she told pt the doorways are 30" wide and encouraged pt to discuss with wife when she could come to practice breaking down the w/c as this is a concern by the patient.   Pt's STG = LTG's due to short LOS anticipated. Stay ended up being on the longer side due to pt being ill with a cold for a few days causing pt to have very limited participation in therapy. Pt also wants to be able to walk into the bathroom at home (about 15') mod I with RW and further time needed to accomplish as pt started off the week at min A level and continues to require cues for safety with standing/gait. Pt currently at a close S and times steady A for mobility.Concern of pt and family is weight of w/c to lift in/out of car and have recommended ultra lightweight w/c due to pt's chronic COPD history and long term decline in mobility status.   Patient continues to demonstrate the following deficits: decreased activity tolerance, impaired balance, decreased strength, decreased ROM, edema, and therefore will continue to benefit from skilled PT intervention to enhance overall performance with activity tolerance, balance and ability to compensate for deficits with new BKA.  Patient progressing toward long term goals..  Continue plan of care.  PT Short Term Goals Week 1:  PT Short Term Goal 1 (Week 1): = LTGs Week 2:  PT Short Term Goal 1  (Week 2): = LTGs  Skilled Therapeutic Interventions/Progress Updates:  Ambulation/gait training;Balance/vestibular training;DME/adaptive equipment instruction;Functional mobility training;Pain management;Patient/family education;Therapeutic Activities;Therapeutic Exercise;UE/LE Strength taining/ROM;Wheelchair propulsion/positioning;Discharge planning;Disease management/prevention;Community reintegration;Neuromuscular re-education;Psychosocial support;Skin care/wound management;Splinting/orthotics;UE/LE Coordination activities   Therapy Documentation Precautions:  Precautions Precautions: Fall Precaution Comments: Chronic O2 at 3L (prior to admission) Restrictions Weight Bearing Restrictions: Yes LLE Weight Bearing: Non weight bearing   See FIM for current functional status  Therapy/Group: Individual Therapy  Harman, [facility] 12/03/2012, 10:28 AM

## 2012-12-03 NOTE — Progress Notes (Signed)
Patient ID: Jeremy [name], male   DOB: [birth year], 62 y.o.   MRN: [id number] Dressing removed left transtibial amputation. Incision healing quite nicely. Patient may have the incision cleansed with soap and water apply a dry dressing daily. We will leave the sutures in place for approximately 3 weeks. The importance of knee extension was discussed with the patient. He states he understands.

## 2012-12-03 NOTE — Progress Notes (Signed)
Physical Therapy Session Note  Patient Details  Name: Jeremy [name] MRN: [id number] Date of Birth: [birth year]  Today's Date: 12/03/2012  Pt missed 60 minutes of skilled PT. Pt refused therapy due to feeling very lethargic and "out of it" due to medication after having dressing changed this morning. Will attempt to make up time as able. RN aware.  Therapy Documentation Precautions:  Precautions Precautions: Fall Precaution Comments: Chronic O2 at 3L (prior to admission) Restrictions Weight Bearing Restrictions: Yes LLE Weight Bearing: Non weight bearing General: Amount of Missed PT Time (min): 60 Minutes Missed Time Reason: Patient unwilling/refused to participate without medical reason    Jeremy [name], Jeremy [name] 12/03/2012, 9:53 AM

## 2012-12-03 NOTE — Progress Notes (Signed)
Patient ID: Jeremy [name], male   DOB: 1950-11-25, 62 y.o.   MRN: [id number] Subjective/Complaints: Had a better day. URI symptoms better. Review of Systems  Musculoskeletal: Positive for joint pain.  Neurological: Positive for sensory change.  All other systems reviewed and are negative.   Objective: Vital Signs: Blood pressure 95/44, pulse 51, temperature 98.5 F (36.9 C), temperature source Oral, resp. rate 19, height 5\' 10"  (1.778 m), weight 105.9 kg (233 lb 7.5 oz), SpO2 99.00%. No results found. Results for orders placed during the hospital encounter of 11/27/12 (from the past 72 hour(s))  GLUCOSE, CAPILLARY     Status: None   Collection Time    11/30/12 11:48 AM      Result Value Range   Glucose-Capillary 95  70 - 99 mg/dL   Comment 1 Notify RN    GLUCOSE, CAPILLARY     Status: Abnormal   Collection Time    11/30/12  4:32 PM      Result Value Range   Glucose-Capillary 103 (*) 70 - 99 mg/dL   Comment 1 Notify RN    GLUCOSE, CAPILLARY     Status: Abnormal   Collection Time    11/30/12  8:41 PM      Result Value Range   Glucose-Capillary 106 (*) 70 - 99 mg/dL   Comment 1 Notify RN    PROTIME-INR     Status: Abnormal   Collection Time    12/01/12  6:16 AM      Result Value Range   Prothrombin Time 25.1 (*) 11.6 - 15.2 seconds   INR 2.37 (*) 0.00 - 1.49  GLUCOSE, CAPILLARY     Status: None   Collection Time    12/01/12  7:27 AM      Result Value Range   Glucose-Capillary 99  70 - 99 mg/dL  GLUCOSE, CAPILLARY     Status: None   Collection Time    12/01/12 11:24 AM      Result Value Range   Glucose-Capillary 89  70 - 99 mg/dL  GLUCOSE, CAPILLARY     Status: None   Collection Time    12/01/12  4:14 PM      Result Value Range   Glucose-Capillary 93  70 - 99 mg/dL  PROTIME-INR     Status: Abnormal   Collection Time    12/02/12  5:25 AM      Result Value Range   Prothrombin Time 27.0 (*) 11.6 - 15.2 seconds   INR 2.61 (*) 0.00 - 1.49  PROTIME-INR     Status:  Abnormal   Collection Time    12/03/12  6:55 AM      Result Value Range   Prothrombin Time 24.4 (*) 11.6 - 15.2 seconds   INR 2.28 (*) 0.00 - 1.49    Physical exam:  General: Alert and oriented x 3, No apparent distress HEENT: Head is normocephalic, atraumatic, PERRLA, EOMI, sclera anicteric, oral mucosa pink and moist, dentition intact, ext ear canals clear,  Neck: Supple without JVD or lymphadenopathy Heart: Reg rate and rhythm. No murmurs rubs or gallops Chest: CTA bilaterally without wheezes, rales, or rhonchi; no distress Abdomen: Soft, non-tender, non-distended, bowel sounds positive. Extremities: No clubbing, cyanosis, or edema. Pulses are 2+ Skin: surgical site healing with mild sero-sanginuous drainage. Tender. Appropriate appearing. Skin flaky/dry Neuro: Pt is cognitively appropriate with normal insight, memory, and awareness. Cranial nerves 2-12 are intact. Distal loss of FT/PP. Reflexes are 1+ in all 4's. Fine motor coordination  is intact. No tremors. Motor function is grossly 5/5 except for amputated leg Musculoskeletal: Full ROM, No pain with AROM or PROM in the neck, trunk, or extremities. Posture appropriate Psych: Pt's affect is appropriate. Pt is cooperative       Assessment/Plan: 1. Functional deficits secondary to left BKA which require 3+ hours per day of interdisciplinary therapy in a comprehensive inpatient rehab setting. Physiatrist is providing close team supervision and 24 hour management of active medical problems listed below. Physiatrist and rehab team continue to assess barriers to discharge/monitor patient progress toward functional and medical goals.  Has immediate post-op dressing--continue for now. Remove tomorrow?   FIM: FIM - Bathing Bathing Steps Patient Completed: Chest;Right Arm;Left Arm;Abdomen;Front perineal area;Right upper leg;Left upper leg;Right lower leg (including foot) Bathing: 4: Min-Patient completes 8-9 35f 10 parts or 75+  percent  FIM - Upper Body Dressing/Undressing Upper body dressing/undressing steps patient completed: Thread/unthread right sleeve of pullover shirt/dresss;Thread/unthread left sleeve of pullover shirt/dress;Put head through opening of pull over shirt/dress;Pull shirt over trunk Upper body dressing/undressing: 5: Set-up assist to: Obtain clothing/put away FIM - Lower Body Dressing/Undressing Lower body dressing/undressing steps patient completed: Thread/unthread right pants leg;Thread/unthread left pants leg;Pull pants up/down;Don/Doff right sock;Don/Doff right shoe;Fasten/unfasten right shoe Lower body dressing/undressing:  (bed level per patient request)  FIM - Toileting Toileting steps completed by patient: Adjust clothing prior to toileting;Performs perineal hygiene;Adjust clothing after toileting Toileting: 0: Activity did not occur  FIM - Diplomatic Services operational officer Devices: Grab bars;Elevated toilet seat Toilet Transfers: 0-Activity did not occur  FIM - Banker Devices: Arm rests;HOB elevated Bed/Chair Transfer: 6: Supine > Sit: No assist;6: Sit > Supine: No assist  FIM - Locomotion: Wheelchair Distance: 50' Locomotion: Wheelchair: 5: Travels 150 ft or more: maneuvers on rugs and over door sills with supervision, cueing or coaxing FIM - Locomotion: Ambulation Locomotion: Ambulation Assistive Devices: Designer, industrial/product Ambulation/Gait Assistance: 4: Min assist Locomotion: Ambulation: 1: Travels less than 50 ft with minimal assistance (Pt.>75%)  Comprehension Comprehension Mode: Auditory Comprehension: 6-Follows complex conversation/direction: With extra time/assistive device  Expression Expression Mode: Verbal Expression: 6-Expresses complex ideas: With extra time/assistive device  Social Interaction Social Interaction: 6-Interacts appropriately with others with medication or extra time (anti-anxiety,  antidepressant).  Problem Solving Problem Solving: 6-Solves complex problems: With extra time  Memory Memory: 6-More than reasonable amt of time  Medical Problem List and Plan:  1. DVT Prophylaxis/Anticoagulation: Pharmaceutical: Coumadin  2. Chronic Pain Management: Has spinal cord stimulator. Continue Fentanyl patch, OxyContin, Oxycodone, Topamax and Cymbalta. As pain subsides may be able to adjust medications 3. Mood: Has realist goals and reasonable outlook overall. Will have LCSW follow for evaluation.  4. Neuropsych: This patient is capable of making decisions on his own behalf.  5. ABLA on chronic anemia: Fe supplement. 9.6 6. O2 dependent/OSA: Encouraged to wear BIPAP at nights to help with sleep hygiene as well as energy levels. Continue oxygen as needed. 7. Morbid Obesity: s/p stomach stapling. Used to weigh 450+ lbs. Pressure relief measures. Will consult dietician to assist with nutritional needs.  8. DM type 2: diet controlled at present 9. Hypokalemia: Increased supplement to bid and levels back to normal 10. COPD: continue Singulair and Dulera. Nebs, nasal sprays initiated in addition to delsym  -chest remains clear   -encouraged pt to work through these symptom, the activity to should help improve clearance if anything 11. Wound--dry dressing daily  LOS (Days) 6 A FACE TO FACE EVALUATION WAS  PERFORMED  Coreena Rubalcava T 12/03/2012, 8:34 AM

## 2012-12-03 NOTE — Plan of Care (Signed)
Problem: RH BOWEL ELIMINATION Goal: RH STG MANAGE BOWEL WITH ASSISTANCE STG Manage Bowel with Assistance. Mod I  Outcome: Not Progressing LBM 11-30-12 Goal: RH STG MANAGE BOWEL W/MEDICATION W/ASSISTANCE STG Manage Bowel with Medication with min Assistance.  LBM 11-30-12

## 2012-12-04 ENCOUNTER — Inpatient Hospital Stay (HOSPITAL_COMMUNITY): Payer: Medicare Other | Admitting: Occupational Therapy

## 2012-12-04 ENCOUNTER — Inpatient Hospital Stay (HOSPITAL_COMMUNITY): Payer: Medicare Other | Admitting: Physical Therapy

## 2012-12-04 DIAGNOSIS — S88119A Complete traumatic amputation at level between knee and ankle, unspecified lower leg, initial encounter: Secondary | ICD-10-CM

## 2012-12-04 DIAGNOSIS — I739 Peripheral vascular disease, unspecified: Secondary | ICD-10-CM

## 2012-12-04 DIAGNOSIS — L98499 Non-pressure chronic ulcer of skin of other sites with unspecified severity: Secondary | ICD-10-CM

## 2012-12-04 LAB — PROTIME-INR: Prothrombin Time: 25.6 seconds — ABNORMAL HIGH (ref 11.6–15.2)

## 2012-12-04 MED ORDER — WARFARIN SODIUM 10 MG PO TABS
10.0000 mg | ORAL_TABLET | Freq: Once | ORAL | Status: AC
  Administered 2012-12-04: 10 mg via ORAL
  Filled 2012-12-04: qty 1

## 2012-12-04 NOTE — Progress Notes (Signed)
Physical Therapy Session Note  Patient Details  Name: Jeremy [name] MRN: [id number] Date of Birth: [birth year]  Today's Date: 12/04/2012              [id number] First session Time: 9518-8416 Second session Time Calculation (min):  60 min First session                                              45 min Second session  Short Term Goals: Week 1:  PT Short Term Goal 1 (Week 1): = LTGs  Skilled Therapeutic Interventions/Progress Updates:    Pt seen in 2 treatment sessions today.    First session: Pt received supine in bed. Performed supine to sit with no assist, sit EOB independently. Pt requires use of portable O2 for continuous O2 at 3L.  Pt performed gait training in room be to door approximately 15 ', wheelchair mobility 150' with supervision assist.  Gait training 20' and 15 ' in rehab gym with supervision assist. Transfer training for squat pivot transfer with supervision assist x5 and sit<>stand x 5 with pt requiring min assist due to c/o increased pain in L limb with mobility once in standing supervision assist.  Seated B LE strengthening exs 2 x 10 reps and standing mini squats 2 x 10.  Wheelchair mobility back to room with supervision assist.  Second session: Pt received in wheelchair.  Pt reports improved pain from this mornings session.  Pt continued with wheelchair mobility with supervision assist and slight increased assist for managing wheelchair parts.  Gait training to various surfaces 20', 25' and 20' with RW and supervision assist, vcs to improve turns and stand to sit.  Supine B strengthening 3 x 10 reps with min vcs for proper technique.  Transfer training to various surfaces with supervision.Peri Jefferson recall of wheelchair set up,hand and foot placement and sequencing. Pt displays improved motivation and participation.   Therapy Documentation Precautions:  Precautions Precautions: Fall Precaution Comments: Chronic O2 at 3L (prior to admission) Restrictions Weight Bearing  Restrictions: Yes LLE Weight Bearing: Non weight bearing Vital Signs: Patient Position, if appropriate: Sitting Oxygen Therapy SpO2: 99 % O2 Device: Nasal cannula O2 Flow Rate (L/min): 3 L/min Pain: Pain Assessment Pain Score: 6-8/10 in first session, increased with mobility. Nsg aware pain meds rec'd.                     4/10 in second session.      Locomotion : Ambulation Ambulation/Gait Assistance: 5: Supervision Wheelchair Mobility Distance: 200'                See FIM for current functional status  Therapy/Group: Individual Therapy both sessions  Jackelyn Knife 12/04/2012, 4:02 PM

## 2012-12-04 NOTE — Patient Care Conference (Signed)
Inpatient RehabilitationTeam Conference and Plan of Care Update Date: 12/01/2012   Time: 2:10 PM    Patient Name: Jeremy [name]      Medical Record Number: [id number]  Date of Birth: [birth year] Sex: Male         Room/Bed: 4W17C/4W17C-01 Payor Info: Payor: MEDICARE / Plan: MEDICARE PART A AND B / Product Type: *No Product type* /    Admitting Diagnosis: LT BKA  Admit Date/Time:  11/27/2012  2:54 PM Admission Comments: No comment available   Primary Diagnosis:  Unilateral complete BKA Principal Problem: Unilateral complete BKA  Patient Active Problem List   Diagnosis Date Noted  . Unilateral complete BKA--left 11/30/2012  . Bacteremia 11/20/2012  . Cellulitis 11/17/2012  . DVT (deep venous thrombosis) 11/17/2012  . PVD (peripheral vascular disease) 11/17/2012  . COPD (chronic obstructive pulmonary disease) 11/17/2012  . Psoriasis 11/17/2012  . Chronic pain syndrome 11/17/2012  . Chronic anemia 11/17/2012  . Diabetes mellitus with peripheral vascular disease 11/17/2012  . Cellulitis of left lower leg 07/27/2011  . 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 Faith Rogue Social Worker Present: [name], LCSW Nurse Present: Kennon Portela, RN PT Present: Karolee Stamps, PT OT Present: Bretta Bang, OT;Patricia Mat Carne, OT     Current Status/Progress Goal Weekly Team Focus  Medical   left bka, chronic pain, copd on oxygen  improving pain, activity tolerance  symptomatic mgt of cold, pain mgt, pre=pros ed   Bowel/Bladder   continent bowel and bladder; noted to spill urinal on occasion  education to prevent spills  monitor for spills   Swallow/Nutrition/ Hydration             ADL's   supervision -> min assist with BADls and transfers  Supervision -> mod I (supervision with bathing & dressing, mod I for toilet transfers)  ADL  retraining, functional transfers, IADLs, overall strength/endurance   Mobility   min A overall for transfers and short distance gait; S w/c propulsion  mod I overall w/c level; mod I very short distance gait with RW; S car transfer  endurance/strengthening, functional transfers, dynamic standing balance, gait training   Communication             Safety/Cognition/ Behavioral Observations  bed alarm in use; no unsafe behaviors noted  to remain safe from falls  monitor for safety   Pain   Tylenol 325-650 mg q 4 hrs prn; Oxycontin 15 mg scheduled q 12 hrs; oxy IR 5 mg 5 times/day prn  pain < 2  monitor for pain q 5 hrs and prn   Skin   psorasis patch healed lt elbow and healing rt knee; 3 cm X 1 cm abrasion with Allevyn dsg intact to umbilicus due to belt rubbing belly (chronic per pt report); Coban surgical dsg intact lt BKA  to keep skin clear of any new breakdown and infection  monitor skin q shift and prn    Rehab Goals Patient on target to meet rehab goals: Yes *See Care Plan and progress notes for long and short-term goals.  Barriers to Discharge: baseline copd, poor stamina    Possible Resolutions to Barriers:  pacing, adaptive equipment, pain control    Discharge Planning/Teaching Needs:  home with wife who can provide intermittent assistance      Team Discussion:  Pain improving?  Not limiting as much  in therapy.  Do not anticipate meeting great distance on ambulation and may need to remain w/c level when wife not at home.  Not feeling well today due to cold but feel we need to monitor participation overall.  Revisions to Treatment Plan:  None   Continued Need for Acute Rehabilitation Level of Care: The patient requires daily medical management by a physician with specialized training in physical medicine and rehabilitation for the following conditions: Daily direction of a multidisciplinary physical rehabilitation program to ensure safe treatment while eliciting the highest  outcome that is of practical value to the patient.: Yes Daily medical management of patient stability for increased activity during participation in an intensive rehabilitation regime.: Yes Daily analysis of laboratory values and/or radiology reports with any subsequent need for medication adjustment of medical intervention for : Post surgical problems;Pulmonary problems (pain)  Mayumi Summerson 12/04/2012, 8:23 AM

## 2012-12-04 NOTE — Progress Notes (Signed)
Social Work Patient ID: Jeremy [name], male   DOB: Jul 12, 1950, 62 y.o.   MRN: [id number]  Met yesterday with pt to review team conference.  Pt aware and agreeable with targeted d/c date of 8/20 at mod i goals.  Aware some concern over participation as a whole and that goals based on completing full therapy days.  Will monitor and assist with support and d/c planning.   Shirl Ludington, LCSW

## 2012-12-04 NOTE — Progress Notes (Signed)
Patient ID: Jeremy [name], male   DOB: [birth year], 62 y.o.   MRN: [id number] Subjective/Complaints: Sinus symptoms better. Feeling better. Missed session yesterday as "10mg  of oxy IR made me sleepy". Review of Systems  Musculoskeletal: Positive for joint pain.  Neurological: Positive for sensory change.  All other systems reviewed and are negative.   Objective: Vital Signs: Blood pressure 104/62, pulse 52, temperature 97.7 F (36.5 C), temperature source Oral, resp. rate 19, height 5\' 10"  (1.778 m), weight 105.9 kg (233 lb 7.5 oz), SpO2 100.00%. No results found. Results for orders placed during the hospital encounter of 11/27/12 (from the past 72 hour(s))  GLUCOSE, CAPILLARY     Status: None   Collection Time    12/01/12 11:24 AM      Result Value Range   Glucose-Capillary 89  70 - 99 mg/dL  GLUCOSE, CAPILLARY     Status: None   Collection Time    12/01/12  4:14 PM      Result Value Range   Glucose-Capillary 93  70 - 99 mg/dL  PROTIME-INR     Status: Abnormal   Collection Time    12/02/12  5:25 AM      Result Value Range   Prothrombin Time 27.0 (*) 11.6 - 15.2 seconds   INR 2.61 (*) 0.00 - 1.49  PROTIME-INR     Status: Abnormal   Collection Time    12/03/12  6:55 AM      Result Value Range   Prothrombin Time 24.4 (*) 11.6 - 15.2 seconds   INR 2.28 (*) 0.00 - 1.49  PROTIME-INR     Status: Abnormal   Collection Time    12/04/12  6:35 AM      Result Value Range   Prothrombin Time 25.6 (*) 11.6 - 15.2 seconds   INR 2.43 (*) 0.00 - 1.49    Physical exam:  General: Alert and oriented x 3, No apparent distress HEENT: Head is normocephalic, atraumatic, PERRLA, EOMI, sclera anicteric, oral mucosa pink and moist, dentition intact, ext ear canals clear,  Neck: Supple without JVD or lymphadenopathy Heart: Reg rate and rhythm. No murmurs rubs or gallops Chest: CTA bilaterally without wheezes, rales, or rhonchi; no distress Abdomen: Soft, non-tender, non-distended, bowel sounds  positive. Extremities: No clubbing, cyanosis, or edema. Pulses are 2+ Skin: surgical site healing with mild sero-sanginuous drainage. Tender. Appropriate appearing. Skin flaky/dry Neuro: Pt is cognitively appropriate with normal insight, memory, and awareness. Cranial nerves 2-12 are intact. Distal loss of FT/PP. Reflexes are 1+ in all 4's. Fine motor coordination is intact. No tremors. Motor function is grossly 5/5 except for amputated leg Musculoskeletal: Full ROM, No pain with AROM or PROM in the neck, trunk, or extremities. Posture appropriate Psych: Pt's affect is appropriate. Pt is cooperative       Assessment/Plan: 1. Functional deficits secondary to left BKA which require 3+ hours per day of interdisciplinary therapy in a comprehensive inpatient rehab setting. Physiatrist is providing close team supervision and 24 hour management of active medical problems listed below. Physiatrist and rehab team continue to assess barriers to discharge/monitor patient progress toward functional and medical goals.      FIM: FIM - Bathing Bathing Steps Patient Completed: Chest;Right Arm;Left Arm;Abdomen;Front perineal area;Right upper leg;Left upper leg;Right lower leg (including foot) Bathing: 4: Min-Patient completes 8-9 46f 10 parts or 75+ percent  FIM - Upper Body Dressing/Undressing Upper body dressing/undressing steps patient completed: Thread/unthread right sleeve of pullover shirt/dresss;Thread/unthread left sleeve of pullover shirt/dress;Put head through  opening of pull over shirt/dress;Pull shirt over trunk Upper body dressing/undressing: 5: Set-up assist to: Obtain clothing/put away FIM - Lower Body Dressing/Undressing Lower body dressing/undressing steps patient completed: Thread/unthread right pants leg;Thread/unthread left pants leg;Pull pants up/down;Don/Doff right sock;Don/Doff right shoe;Fasten/unfasten right shoe Lower body dressing/undressing:  (bed level per patient  request)  FIM - Toileting Toileting steps completed by patient: Adjust clothing prior to toileting;Performs perineal hygiene;Adjust clothing after toileting Toileting: 0: Activity did not occur  FIM - Diplomatic Services operational officer Devices: Grab bars;Elevated toilet seat Toilet Transfers: 0-Activity did not occur  FIM - Banker Devices: Arm rests;Walker Bed/Chair Transfer: 6: Supine > Sit: No assist;6: Sit > Supine: No assist;5: Bed > Chair or W/C: Supervision (verbal cues/safety issues);5: Chair or W/C > Bed: Supervision (verbal cues/safety issues)  FIM - Locomotion: Wheelchair Distance: 50' Locomotion: Wheelchair: 5: Travels 150 ft or more: maneuvers on rugs and over door sills with supervision, cueing or coaxing FIM - Locomotion: Ambulation Locomotion: Ambulation Assistive Devices: Designer, industrial/product Ambulation/Gait Assistance: 5: Supervision Locomotion: Ambulation: 1: Travels less than 50 ft with supervision/safety issues  Comprehension Comprehension Mode: Auditory Comprehension: 6-Follows complex conversation/direction: With extra time/assistive device  Expression Expression Mode: Verbal Expression: 6-Expresses complex ideas: With extra time/assistive device  Social Interaction Social Interaction: 6-Interacts appropriately with others with medication or extra time (anti-anxiety, antidepressant).  Problem Solving Problem Solving: 6-Solves complex problems: With extra time  Memory Memory: 7-Complete Independence: No helper  Medical Problem List and Plan:  1. DVT Prophylaxis/Anticoagulation: Pharmaceutical: Coumadin  2. Chronic Pain Management: Has spinal cord stimulator. Continue Fentanyl patch, OxyContin, Oxycodone, Topamax and Cymbalta. Poor pain coping skills 3. Mood: Has realist goals and reasonable outlook overall. Will have LCSW follow for evaluation.  4. Neuropsych: This patient is capable of making decisions on  his own behalf.  5. ABLA on chronic anemia: Fe supplement. 9.6 6. O2 dependent/OSA: Encouraged to wear BIPAP at nights to help with sleep hygiene as well as energy levels. Continue oxygen as needed. 7. Morbid Obesity: s/p stomach stapling. . Will consult dietician to assist with nutritional needs.  8. DM type 2: diet controlled at present 9. Hypokalemia: Increased supplement to bid and levels back to normal 10. COPD: continue Singulair and Dulera. Nebs, nasal sprays initiated in addition to delsym  -chest remains clear   -symptoms improving 11. Wound--dry dressing daily, ACE  LOS (Days) 7 A FACE TO FACE EVALUATION WAS PERFORMED  Melitza Metheny T 12/04/2012, 8:17 AM

## 2012-12-04 NOTE — Progress Notes (Signed)
ANTICOAGULATION CONSULT NOTE - Follow Up Consult  Pharmacy Consult for Coumadin Indication: PVD, history of DVTs  No Known Allergies  Patient Measurements: Height: 5\' 10"  (177.8 cm) Weight: 233 lb 7.5 oz (105.9 kg) IBW/kg (Calculated) : 73  Vital Signs: Temp: 97.7 F (36.5 C) (08/15 0629) Temp src: Oral (08/15 0629) BP: 104/62 mmHg (08/15 0629) Pulse Rate: 52 (08/15 0629)  Labs:  Recent Labs  12/02/12 0525 12/03/12 0655 12/04/12 0635  LABPROT 27.0* 24.4* 25.6*  INR 2.61* 2.28* 2.43*    Estimated Creatinine Clearance: 97.5 ml/min (by C-G formula based on Cr of 0.97).   Medications:  Scheduled:  . dextromethorphan  15 mg Oral BID  . DULoxetine  30 mg Oral Daily  . feeding supplement  30 mL Oral Daily  . fentaNYL  25 mcg Transdermal Q72H  . ferrous sulfate  325 mg Oral TID WC  . fluocinonide-emollient   Topical TID  . furosemide  40 mg Oral Q1500  . levofloxacin  750 mg Oral Daily  . mometasone-formoterol  2 puff Inhalation BID  . montelukast  10 mg Oral QHS  . multivitamin with minerals  1 tablet Oral Daily  . OxyCODONE  15 mg Oral Q12H  . potassium chloride SA  20 mEq Oral BID  . simvastatin  5 mg Oral q1800  . sodium chloride  1 spray Each Nare BID  . topiramate  100 mg Oral BID  . warfarin  10 mg Oral ONCE-1800  . 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  INR (2.43) is therapeutic today. No new CBC, no bleeding noted. Continues on levaquin (8/10 - 8/18) which may increase the INR. Goal of Therapy:  INR 2-3   Plan:  1. Warfarin 10 mg PO x 1 tonight 2. F/u AM INR  Bayard Hugger, PharmD, BCPS  Clinical Pharmacist  Pager: [phone]   12/04/2012 1:12 PM

## 2012-12-04 NOTE — Progress Notes (Signed)
Occupational Therapy Session Notes  Patient Details  Name: Jeremy [name] MRN: [id number] Date of Birth: 1950-12-14  Today's Date: 12/04/2012  Short Term Goals: Week 1:  OT Short Term Goal 1 (Week 1): STGs = LTGs based on LOS  Skilled Therapeutic Interventions/Progress Updates:   Session #1 [id number] - 55 Minutes Individual Therapy No complaints of pain Patient found supine in bed upon entering room. Patient engaged in bed mobility, sat edge of bed, then ambulated -> w/c seated near sink (close supervision for functional ambulation). Once seated in w/c at sink, patient performed UB/LB bathing & dressing. LB ADLs performed in sit<>stand position with occasional steady assist. After ADL, patient engaged in some BUE strengthening exercises using theraband and completed some w/c pushups. After this, patient requested to get back to bed. Patient able to transfer -> edge of bed from w/c with distant supervision. Patient left seated edge of bed with call bell & phone within reach.   Session #2 [id number] - 30 Minutes Individual Therapy No complaints of pain Patient found supine in bed upon entering room. Patient Engaged in bed mobility and transferred edge of bed -> w/c (scoot) with supervision - patient set-up w/c with min verbal cues. From here, patient propelled self to tub room for tub/shower transfer on/off tub transfer bench. Patient stated he did not want to take a shower while he was here, but does once he is home. Therapist recommended patient use tub transfer bench at home and wait for [facility] before taking a shower. After tub/shower transfer, patient propelled self -> therapy gym for UE exercise using SCIFIT machine for 5 minutes. Patient then propelled self back to room and left seated in w/c with call bell & phone within reach.   Precautions:  Precautions Precautions: Fall Precaution Comments: Chronic O2 at 3L (prior to admission) Restrictions Weight Bearing Restrictions: Yes LLE Weight  Bearing: Non weight bearing  See FIM for current functional status  Ashten Prats 12/04/2012, 7:31 AM

## 2012-12-05 ENCOUNTER — Inpatient Hospital Stay (HOSPITAL_COMMUNITY): Payer: Medicare Other | Admitting: Physical Therapy

## 2012-12-05 LAB — PROTIME-INR
INR: 2.37 — ABNORMAL HIGH (ref 0.00–1.49)
Prothrombin Time: 25.1 seconds — ABNORMAL HIGH (ref 11.6–15.2)

## 2012-12-05 MED ORDER — METHOCARBAMOL 500 MG PO TABS
750.0000 mg | ORAL_TABLET | Freq: Four times a day (QID) | ORAL | Status: DC | PRN
  Administered 2012-12-06 – 2012-12-09 (×9): 750 mg via ORAL
  Filled 2012-12-05: qty 1
  Filled 2012-12-05 (×2): qty 2
  Filled 2012-12-05: qty 1
  Filled 2012-12-05 (×2): qty 2
  Filled 2012-12-05: qty 1
  Filled 2012-12-05: qty 2
  Filled 2012-12-05: qty 1
  Filled 2012-12-05 (×2): qty 2

## 2012-12-05 MED ORDER — OXYCODONE HCL 5 MG PO TABS
10.0000 mg | ORAL_TABLET | Freq: Once | ORAL | Status: AC
  Administered 2012-12-05: 10 mg via ORAL

## 2012-12-05 MED ORDER — METHOCARBAMOL 500 MG PO TABS
500.0000 mg | ORAL_TABLET | Freq: Four times a day (QID) | ORAL | Status: DC | PRN
  Filled 2012-12-05: qty 1

## 2012-12-05 MED ORDER — OXYCODONE HCL 5 MG PO TABS
10.0000 mg | ORAL_TABLET | Freq: Every day | ORAL | Status: DC | PRN
  Administered 2012-12-05 – 2012-12-09 (×15): 10 mg via ORAL
  Filled 2012-12-05 (×5): qty 2
  Filled 2012-12-05: qty 1
  Filled 2012-12-05 (×5): qty 2
  Filled 2012-12-05: qty 1
  Filled 2012-12-05 (×6): qty 2

## 2012-12-05 MED ORDER — WARFARIN SODIUM 7.5 MG PO TABS: 7.5000 mg | ORAL_TABLET | ORAL | Status: DC

## 2012-12-05 MED ORDER — WARFARIN SODIUM 10 MG PO TABS
10.0000 mg | ORAL_TABLET | ORAL | Status: DC
  Administered 2012-12-05: 10 mg via ORAL
  Filled 2012-12-05 (×2): qty 1

## 2012-12-05 NOTE — Progress Notes (Signed)
ANTICOAGULATION CONSULT NOTE - Follow Up Consult  Pharmacy Consult:  Coumadin Indication: PVD, history of DVTs  No Known Allergies  Patient Measurements: Height: 5\' 10"  (177.8 cm) Weight: 233 lb 7.5 oz (105.9 kg) IBW/kg (Calculated) : 73  Vital Signs: Temp: 97.6 F (36.4 C) (08/16 0550) Temp src: Oral (08/16 0550) BP: 97/61 mmHg (08/16 0550) Pulse Rate: 51 (08/16 0550)  Labs:  Recent Labs  12/03/12 0655 12/04/12 0635 12/05/12 0500  LABPROT 24.4* 25.6* 25.1*  INR 2.28* 2.43* 2.37*    Estimated Creatinine Clearance: 97.5 ml/min (by C-G formula based on Cr of 0.97).      Assessment: 63yo male with history of DVTs and PVD continues on Coumadin from PTA.  INR therapeutic; no bleeding reported.  Noted DDI with Levaquin.     Goal of Therapy:  INR 2-3    Plan:  - Coumadin 10mg  PO daily except 7.5mg  on MWF - Daily PT / INR for now     Jeremy [name] D. Laney Potash, PharmD, BCPS Pager:  [phone] 12/05/2012, 8:00 AM

## 2012-12-05 NOTE — Progress Notes (Addendum)
Patient ID: Jeremy [name], male   DOB: 07/18/1950, 62 y.o.   MRN: [id number] Subjective/Complaints: Left leg hurt more in the middle of night, shooting pain. Current meds didn't help Review of Systems  Musculoskeletal: Positive for joint pain.  Neurological: Positive for sensory change.  All other systems reviewed and are negative.   Objective: Vital Signs: Blood pressure 97/61, pulse 51, temperature 97.6 F (36.4 C), temperature source Oral, resp. rate 18, height 5\' 10"  (1.778 m), weight 105.9 kg (233 lb 7.5 oz), SpO2 95.00%. No results found. Results for orders placed during the hospital encounter of 11/27/12 (from the past 72 hour(s))  PROTIME-INR     Status: Abnormal   Collection Time    12/03/12  6:55 AM      Result Value Range   Prothrombin Time 24.4 (*) 11.6 - 15.2 seconds   INR 2.28 (*) 0.00 - 1.49  PROTIME-INR     Status: Abnormal   Collection Time    12/04/12  6:35 AM      Result Value Range   Prothrombin Time 25.6 (*) 11.6 - 15.2 seconds   INR 2.43 (*) 0.00 - 1.49  PROTIME-INR     Status: Abnormal   Collection Time    12/05/12  5:00 AM      Result Value Range   Prothrombin Time 25.1 (*) 11.6 - 15.2 seconds   INR 2.37 (*) 0.00 - 1.49    Physical exam:  General: Alert and oriented x 3, No apparent distress HEENT: Head is normocephalic, atraumatic, PERRLA, EOMI, sclera anicteric, oral mucosa pink and moist, dentition intact, ext ear canals clear,  Neck: Supple without JVD or lymphadenopathy Heart: Reg rate and rhythm. No murmurs rubs or gallops Chest: CTA bilaterally without wheezes, rales, or rhonchi; no distress Abdomen: Soft, non-tender, non-distended, bowel sounds positive. Extremities: No clubbing, cyanosis, or edema. Pulses are 2+ Skin: surgical site healing with mild sero-sanginuous drainage. Tender. Appropriate appearing. Skin flaky/dry Neuro: Pt is cognitively appropriate with normal insight, memory, and awareness. Cranial nerves 2-12 are intact. Distal loss of  FT/PP. Reflexes are 1+ in all 4's. Fine motor coordination is intact. No tremors. Motor function is grossly 5/5 except for amputated leg Musculoskeletal: Full ROM, No pain with AROM or PROM in the neck, trunk, or extremities. Posture appropriate Psych: Pt's affect is appropriate. Pt is cooperative       Assessment/Plan: 1. Functional deficits secondary to left BKA which require 3+ hours per day of interdisciplinary therapy in a comprehensive inpatient rehab setting. Physiatrist is providing close team supervision and 24 hour management of active medical problems listed below. Physiatrist and rehab team continue to assess barriers to discharge/monitor patient progress toward functional and medical goals.      FIM: FIM - Bathing Bathing Steps Patient Completed: Chest;Right Arm;Left Arm;Abdomen;Front perineal area;Buttocks;Right upper leg;Left upper leg;Right lower leg (including foot) Bathing: 4: Steadying assist (sit<>stand position)  FIM - Upper Body Dressing/Undressing Upper body dressing/undressing steps patient completed: Thread/unthread right sleeve of pullover shirt/dresss;Thread/unthread left sleeve of pullover shirt/dress;Put head through opening of pull over shirt/dress;Pull shirt over trunk Upper body dressing/undressing: 5: Set-up assist to: Obtain clothing/put away FIM - Lower Body Dressing/Undressing Lower body dressing/undressing steps patient completed: Thread/unthread right pants leg;Thread/unthread left pants leg;Pull pants up/down;Don/Doff right sock;Don/Doff right shoe;Fasten/unfasten right shoe Lower body dressing/undressing: 4: Steadying Assist (sit<>stand position)  FIM - Toileting Toileting steps completed by patient: Adjust clothing prior to toileting;Performs perineal hygiene;Adjust clothing after toileting Toileting: 0: Activity did not occur  FIM -  Diplomatic Services operational officer Devices: Grab Museum/gallery conservator Transfers:  0-Activity did not occur  FIM - Banker Devices: Environmental consultant;Arm rests Bed/Chair Transfer: 7: Supine > Sit: No assist;6: Sit > Supine: No assist;5: Bed > Chair or W/C: Supervision (verbal cues/safety issues);5: Chair or W/C > Bed: Supervision (verbal cues/safety issues)  FIM - Locomotion: Wheelchair Distance: 200' Locomotion: Wheelchair: 5: Travels 150 ft or more: maneuvers on rugs and over door sills with supervision, cueing or coaxing FIM - Locomotion: Ambulation Locomotion: Ambulation Assistive Devices: Designer, industrial/product Ambulation/Gait Assistance: 5: Supervision Locomotion: Ambulation: 1: Travels less than 50 ft with supervision/safety issues  Comprehension Comprehension Mode: Auditory Comprehension: 6-Follows complex conversation/direction: With extra time/assistive device  Expression Expression Mode: Verbal Expression: 6-Expresses complex ideas: With extra time/assistive device  Social Interaction Social Interaction: 6-Interacts appropriately with others with medication or extra time (anti-anxiety, antidepressant).  Problem Solving Problem Solving: 6-Solves complex problems: With extra time  Memory Memory: 7-Complete Independence: No helper  Medical Problem List and Plan:  1. DVT Prophylaxis/Anticoagulation: Pharmaceutical: Coumadin  2. Chronic Pain Management: Has spinal cord stimulator. Continue Fentanyl patch, OxyContin, Oxycodone, Topamax and Cymbalta. Poor pain coping skills--discussed with the patient that there needs to be a balance with his pain medications.  Encouraged him to use antispasmodics, ice, heat also 3. Mood: Has realist goals and reasonable outlook overall. Will have LCSW follow for evaluation.  4. Neuropsych: This patient is capable of making decisions on his own behalf.  5. ABLA on chronic anemia: Fe supplement. 9.6 6. O2 dependent/OSA: Encouraged to wear BIPAP at nights to help with sleep hygiene as well as energy  levels. Continue oxygen as needed. 7. Morbid Obesity: s/p stomach stapling. . Will consult dietician to assist with nutritional needs.  8. DM type 2: diet controlled at present 9. Hypokalemia: Increased supplement to bid and levels back to normal 10. COPD: continue Singulair and Dulera. Nebs, nasal sprays initiated in addition to delsym  -chest remains clear   -symptoms improving 11. Wound--dry dressing daily, ACE  LOS (Days) 8 A FACE TO FACE EVALUATION WAS PERFORMED  Lavena Loretto T 12/05/2012, 8:57 AM

## 2012-12-05 NOTE — Progress Notes (Signed)
Physical Therapy Session Note  Patient Details  Name: BOWDEN BOODY MRN: [id number] Date of Birth: Oct 06, 1950  Today's Date: 12/05/2012 Time: 7829-5621 Time Calculation (min): 43 min  Short Term Goals: Week 1:  PT Short Term Goal 1 (Week 1): = LTGs  Skilled Therapeutic Interventions/Progress Updates:  Pt was seen bedside in the pm. Pt transferred supine to edge of bed with side rail, head of bed elevated and S. Pt transferred edge of bed to w/c with S. Pt propelled w/c to and from gym with B UEs and S. Pt ambulated x2 with rolling walker, 10 feet x 2, pt ambulated with S. Performed quad sets and ham sets and L LE.   Therapy Documentation Precautions:  Precautions Precautions: Fall Precaution Comments: Chronic O2 at 3L (prior to admission) Restrictions Weight Bearing Restrictions: Yes LLE Weight Bearing: Non weight bearing General:  Pain: No c/o pain.   See FIM for current functional status  Therapy/Group: Individual Therapy  Rayford Halsted 12/05/2012, 4:13 PM

## 2012-12-06 ENCOUNTER — Inpatient Hospital Stay (HOSPITAL_COMMUNITY): Payer: Medicare Other

## 2012-12-06 MED ORDER — WARFARIN SODIUM 2.5 MG PO TABS
12.5000 mg | ORAL_TABLET | Freq: Once | ORAL | Status: AC
  Administered 2012-12-06: 12.5 mg via ORAL
  Filled 2012-12-06: qty 1

## 2012-12-06 NOTE — Progress Notes (Signed)
Occupational Therapy Session Note  Patient Details  Name: Jeremy [name] MRN: [id number] Date of Birth: 1950-12-31  Today's Date: 12/06/2012 Time: 4098-1191 Time Calculation (min): 44 min  Short Term Goals: Week 2: STG=LTG d/t ELOS  Skilled Therapeutic Interventions/Progress Updates: Therapeutic activities with emphasis on functional mobility using RW, endurance, car transfers and family ed on patient's progress with spouse present during session.   Car simulator was set to height of patient's seat in his [employer], 30" as measured by spouse this date, and patient was challenged to ambulate equivalent distance as projected from his front door to driveway (approx 20' per patient).   Patient completed mobility with his wife observing and required 1 rest break after ambulating approx 15'.   Patient transferred successfully to car seat but was SOB.  HR was monitored at120 bpm but returned to 88 bpm after resting approx 3 minutes on 3L portable 02.  Patient was unable to anticipate any other issues which might impact successfully return home during this session.      Therapy Documentation Precautions:  Precautions Precautions: Fall Precaution Comments: Chronic O2 at 3L (prior to admission) Restrictions Weight Bearing Restrictions: Yes LLE Weight Bearing: Non weight bearing  Vital Signs: Therapy Vitals Temp: 98.3 F (36.8 C) Temp src: Oral Pulse Rate: 66 Resp: 20 BP: 112/71 mmHg Patient Position, if appropriate: Lying Oxygen Therapy SpO2: 98 % O2 Device: Nasal cannula O2 Flow Rate (L/min): 3 L/min  Pain: Pain Assessment Pain Assessment: No/denies pain  See FIM for current functional status  Therapy/Group: Individual Therapy  Georgeanne Nim 12/06/2012, 4:27 PM

## 2012-12-06 NOTE — Progress Notes (Signed)
Provided patient education on bowel management and laxatives.  Pt drank 1 container of prune juice.  Achieved results. Pt's wife voiced concerns that "she feels pt may need her additional assistance after discharge.  She works full-time Monday through Friday and would need documentation to support her request for additional time off.  Nurse informed pt and his wife that this information would be forwarded to the physician and social work. Will continue to monitor.Oretha Milch D

## 2012-12-06 NOTE — Progress Notes (Signed)
ANTICOAGULATION CONSULT NOTE - Follow Up Consult  Pharmacy Consult:  Coumadin Indication: PVD, history of DVTs  No Known Allergies  Patient Measurements: Height: 5\' 10"  (177.8 cm) Weight: 233 lb 7.5 oz (105.9 kg) IBW/kg (Calculated) : 73  Vital Signs: Temp: 97.5 F (36.4 C) (08/17 0500) Temp src: Oral (08/17 0500) BP: 123/72 mmHg (08/17 0500) Pulse Rate: 56 (08/17 0500)  Labs:  Recent Labs  12/04/12 0635 12/05/12 0500 12/06/12 0515  LABPROT 25.6* 25.1* 21.7*  INR 2.43* 2.37* 1.96*    Estimated Creatinine Clearance: 97.5 ml/min (by C-G formula based on Cr of 0.97).      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  INR slightly sub-therapeutic today; no bleeding reported.  Patient to complete Levaquin course today and anticipate the need of higher Coumadin doses.   Goal of Therapy:  INR 2-3     Plan:  - D/C scheduled Coumadin order - Coumadin 12.5mg  PO today - Daily PT / INR    Shelsy Seng D. Laney Potash, PharmD, BCPS Pager:  [phone] 12/06/2012, 8:16 AM

## 2012-12-06 NOTE — Progress Notes (Signed)
Patient ID: Jeremy [name], male   DOB: [birth year], 62 y.o.   MRN: [id number] Subjective/Complaints: Says he slept better. Tells me robaxin helped. RN tells me otherwise Review of Systems  Musculoskeletal: Positive for joint pain.  Neurological: Positive for sensory change.  All other systems reviewed and are negative.   Objective: Vital Signs: Blood pressure 123/72, pulse 56, temperature 97.5 F (36.4 C), temperature source Oral, resp. rate 18, height 5\' 10"  (1.778 m), weight 105.9 kg (233 lb 7.5 oz), SpO2 100.00%. No results found. Results for orders placed during the hospital encounter of 11/27/12 (from the past 72 hour(s))  PROTIME-INR     Status: Abnormal   Collection Time    12/04/12  6:35 AM      Result Value Range   Prothrombin Time 25.6 (*) 11.6 - 15.2 seconds   INR 2.43 (*) 0.00 - 1.49  PROTIME-INR     Status: Abnormal   Collection Time    12/05/12  5:00 AM      Result Value Range   Prothrombin Time 25.1 (*) 11.6 - 15.2 seconds   INR 2.37 (*) 0.00 - 1.49  PROTIME-INR     Status: Abnormal   Collection Time    12/06/12  5:15 AM      Result Value Range   Prothrombin Time 21.7 (*) 11.6 - 15.2 seconds   INR 1.96 (*) 0.00 - 1.49    Physical exam:  General: Alert and oriented x 3, No apparent distress HEENT: Head is normocephalic, atraumatic, PERRLA, EOMI, sclera anicteric, oral mucosa pink and moist, dentition intact, ext ear canals clear,  Neck: Supple without JVD or lymphadenopathy Heart: Reg rate and rhythm. No murmurs rubs or gallops Chest: CTA bilaterally without wheezes, rales, or rhonchi; no distress Abdomen: Soft, non-tender, non-distended, bowel sounds positive. Extremities: No clubbing, cyanosis, or edema. Pulses are 2+ Skin: surgical site healing with mild sero-sanginuous drainage. Tender. Appropriate appearing. Skin flaky/dry Neuro: Pt is cognitively appropriate with normal insight, memory, and awareness. Cranial nerves 2-12 are intact. Distal loss of FT/PP.  Reflexes are 1+ in all 4's. Fine motor coordination is intact. No tremors. Motor function is grossly 5/5 except for amputated leg Musculoskeletal: Full ROM, No pain with AROM or PROM in the neck, trunk, or extremities. Posture appropriate Psych: Pt's affect is appropriate. Pt is cooperative       Assessment/Plan: 1. Functional deficits secondary to left BKA which require 3+ hours per day of interdisciplinary therapy in a comprehensive inpatient rehab setting. Physiatrist is providing close team supervision and 24 hour management of active medical problems listed below. Physiatrist and rehab team continue to assess barriers to discharge/monitor patient progress toward functional and medical goals.      FIM: FIM - Bathing Bathing Steps Patient Completed: Chest;Right Arm;Left Arm;Abdomen;Front perineal area;Buttocks;Right upper leg;Left upper leg;Right lower leg (including foot) Bathing: 4: Steadying assist (sit<>stand position)  FIM - Upper Body Dressing/Undressing Upper body dressing/undressing steps patient completed: Thread/unthread right sleeve of pullover shirt/dresss;Thread/unthread left sleeve of pullover shirt/dress;Put head through opening of pull over shirt/dress;Pull shirt over trunk Upper body dressing/undressing: 5: Set-up assist to: Obtain clothing/put away FIM - Lower Body Dressing/Undressing Lower body dressing/undressing steps patient completed: Thread/unthread right pants leg;Thread/unthread left pants leg;Pull pants up/down;Don/Doff right sock;Don/Doff right shoe;Fasten/unfasten right shoe Lower body dressing/undressing: 4: Steadying Assist (sit<>stand position)  FIM - Toileting Toileting steps completed by patient: Adjust clothing prior to toileting;Performs perineal hygiene;Adjust clothing after toileting Toileting: 0: Activity did not occur  FIM - Toilet Transfers  Toilet Transfers Assistive Devices: Grab Museum/gallery conservator Transfers: 0-Activity did  not occur  FIM - Banker Devices: Arm rests;Bed rails Bed/Chair Transfer: 5: Supine > Sit: Supervision (verbal cues/safety issues);5: Bed > Chair or W/C: Supervision (verbal cues/safety issues)  FIM - Locomotion: Wheelchair Distance: 200' Locomotion: Wheelchair: 5: Travels 150 ft or more: maneuvers on rugs and over door sills with supervision, cueing or coaxing FIM - Locomotion: Ambulation Locomotion: Ambulation Assistive Devices: Designer, industrial/product Ambulation/Gait Assistance: 5: Supervision Locomotion: Ambulation: 1: Travels less than 50 ft with supervision/safety issues  Comprehension Comprehension Mode: Auditory Comprehension: 6-Follows complex conversation/direction: With extra time/assistive device  Expression Expression Mode: Verbal Expression: 6-Expresses complex ideas: With extra time/assistive device  Social Interaction Social Interaction: 6-Interacts appropriately with others with medication or extra time (anti-anxiety, antidepressant).  Problem Solving Problem Solving: 6-Solves complex problems: With extra time  Memory Memory: 7-Complete Independence: No helper  Medical Problem List and Plan:  1. DVT Prophylaxis/Anticoagulation: Pharmaceutical: Coumadin  2. Chronic Pain Management: Has spinal cord stimulator. Continue Fentanyl patch, OxyContin, Oxycodone increased to 10mg  for breakthrough pain, Topamax and Cymbalta. Poor pain coping skills--discussed with the patient that there needs to be a balance with his pain medications.  Encouraged him to use antispasmodics, ice, heat also 3. Mood: "dependent personality"  4. Neuropsych: This patient is capable of making decisions on his own behalf.  5. ABLA on chronic anemia: Fe supplement. 9.6 6. O2 dependent/OSA: Encouraged to wear BIPAP at nights to help with sleep hygiene as well as energy levels. Continue oxygen as needed. 7. Morbid Obesity: s/p stomach stapling. . Will consult  dietician to assist with nutritional needs.  8. DM type 2: diet controlled at present 9. Hypokalemia: Increased supplement to bid and levels back to normal 10. COPD: continue Singulair and Dulera. Nebs, nasal sprays initiated in addition to delsym  -chest remains clear   -symptoms improving 11. Wound--dry dressing daily, ACE  LOS (Days) 9 A FACE TO FACE EVALUATION WAS PERFORMED  SWARTZ,ZACHARY T 12/06/2012, 7:20 AM

## 2012-12-07 ENCOUNTER — Inpatient Hospital Stay (HOSPITAL_COMMUNITY): Payer: Medicare Other

## 2012-12-07 ENCOUNTER — Inpatient Hospital Stay (HOSPITAL_COMMUNITY): Payer: Medicare Other | Admitting: Occupational Therapy

## 2012-12-07 LAB — PROTIME-INR: INR: 1.72 — ABNORMAL HIGH (ref 0.00–1.49)

## 2012-12-07 MED ORDER — MOMETASONE FURO-FORMOTEROL FUM 100-5 MCG/ACT IN AERO: 2.0000 | INHALATION_SPRAY | Freq: Two times a day (BID) | RESPIRATORY_TRACT | Status: DC | PRN

## 2012-12-07 MED ORDER — WARFARIN SODIUM 2.5 MG PO TABS
12.5000 mg | ORAL_TABLET | Freq: Once | ORAL | Status: AC
  Administered 2012-12-07: 12.5 mg via ORAL
  Filled 2012-12-07: qty 1

## 2012-12-07 NOTE — Plan of Care (Signed)
Problem: RH SKIN INTEGRITY Goal: RH STG ABLE TO PERFORM INCISION/WOUND CARE W/ASSISTANCE STG Able To Perform Incision/Wound Care With Assistance. Min A  Outcome: Not Progressing Demo dressing change with pt and show video of stump wrapping

## 2012-12-07 NOTE — Progress Notes (Signed)
Physical Therapy Session Note  Patient Details  Name: Jeremy [name] MRN: [id number] Date of Birth: 04-19-1951  Today's Date: 12/07/2012 Time: 1027-2536 Time Calculation (min): 45 min  Short Term Goals: Week 2:  PT Short Term Goal 1 (Week 2): = LTGs  Skilled Therapeutic Interventions/Progress Updates:    Session focused on overall endurance and strengthening including w/c propulsion, short distance gait with RW, Nustep x 10 min on level 4 and basic transfers. Pt mod I for transfers this PM and close S for gait with RW. Pt feels confident for d/c on Wed.  Therapy Documentation Precautions:  Precautions Precautions: Fall Precaution Comments: Chronic O2 at 3L (prior to admission) Restrictions Weight Bearing Restrictions: Yes LLE Weight Bearing: Non weight bearing  Pain: Denies pain.  See FIM for current functional status  Therapy/Group: Individual Therapy  [name], [name] 12/07/2012, 4:30 PM

## 2012-12-07 NOTE — Progress Notes (Signed)
Occupational Therapy Session Note  Patient Details  Name: Jeremy [name] MRN: [id number] Date of Birth: [birth year]  Today's Date: 12/07/2012 Time: 1130-1200 Time Calculation (min): 30 min  Short Term Goals: Week 2:   STG=LTG d/t short LOS  Skilled Therapeutic Interventions/Progress Updates:  Therapeutic activities with emphasis on fall recovery, problem-solving, management of symptoms of SOB.   Patient initially denied potential for fall at home but agreed to recommended session due to acknowledged risk from oxygen tubing while ambulating indoors.   Patient able to transfer from w/c to floor with contact guard for safety.   He followed steps demonstrated prior to his attempt to include transitioning from supine to side-lying, side-lying to long-sitting, long-sitting to quad-ped (hands and knees), but deviated from demo by rolling onto his stomach onto nearest furniture (loveseat) versus leveraging to side-sitting with use of right LE and bil UE.  Patient recognized that his method was awkward and would be ineffective with use of just a chair but he felt confident he could manage recovery after second demo provided by OT.   Patient's exertion was moderate, without 02 (at his choice), with O2 sats at 96% after transfer to loveseat.  Patient wheeled his w/c back to his room with OT escort and providing setup assist for noon meal provided.   Therapy Documentation Precautions:  Precautions Precautions: Fall Precaution Comments: Chronic O2 at 3L (prior to admission) Restrictions Weight Bearing Restrictions: Yes LLE Weight Bearing: Non weight bearing   Pain: Pain Assessment Pain Assessment: No/denies pain   See FIM for current functional status  Therapy/Group: Individual Therapy  Georgeanne Nim 12/07/2012, 12:39 PM

## 2012-12-07 NOTE — Progress Notes (Signed)
Physical Therapy Session Note  Patient Details  Name: Jeremy [name] MRN: [id number] Date of Birth: 02-15-1951  Today's Date: 12/07/2012 Time: 1030-1125 Time Calculation (min): 55 min  Short Term Goals: Week 2:  PT Short Term Goal 1 (Week 2): = LTGs  Skilled Therapeutic Interventions/Progress Updates:    Session focused on functional transfers, supine therex for residual limb strengthening and ROM, w/c mobility in controlled and home environments, simulated car transfer including gait with RW up to/back from car with overall S, and discussion of DME and d/c planning. Pt reports wife can come in around 11 am on Wednesday before d/c to review break down of w/c. He states that she observed the car transfer this weekend and felt comfortable with everything she saw and does not feel like she needs further training despite being offered. Pt close S approaching mod I for basic transfers and short distance gait with RW.   Therapy Documentation Precautions:  Precautions Precautions: Fall Precaution Comments: Chronic O2 at 3L (prior to admission) Restrictions Weight Bearing Restrictions: Yes LLE Weight Bearing: Non weight bearing Pain: Pain Assessment Pain Assessment: 0-10 Pain Score: 5  Pain Location: Leg Pain Orientation: Left Pain Descriptors / Indicators: Sharp;Shooting Pain Onset: On-going Patients Stated Pain Goal: 3 Pain Intervention(s): Medication (See eMAR) Locomotion : Ambulation Ambulation/Gait Assistance: 5: Supervision   See FIM for current functional status  Therapy/Group: Individual Therapy  Ezzard, Ditmer [facility] 12/07/2012, 12:07 PM

## 2012-12-07 NOTE — Progress Notes (Signed)
RT spoke with pt about cpap/bipap order.  Pt refuses treatment at this time and says he doesn't use his home machine either.  Rt informed him, if he changes his mind, to call RN and she/he will call RT.

## 2012-12-07 NOTE — Progress Notes (Signed)
Patient ID: Jeremy [name], male   DOB: 22-Dec-1950, 62 y.o.   MRN: [id number] Subjective/Complaints: Pain improving Review of Systems  Musculoskeletal: Positive for joint pain.  Neurological: Positive for sensory change.  All other systems reviewed and are negative.   Objective: Vital Signs: Blood pressure 112/71, pulse 66, temperature 98.3 F (36.8 C), temperature source Oral, resp. rate 20, height 5\' 10"  (1.778 m), weight 105.9 kg (233 lb 7.5 oz), SpO2 98.00%. No results found. Results for orders placed during the hospital encounter of 11/27/12 (from the past 72 hour(s))  PROTIME-INR     Status: Abnormal   Collection Time    12/05/12  5:00 AM      Result Value Range   Prothrombin Time 25.1 (*) 11.6 - 15.2 seconds   INR 2.37 (*) 0.00 - 1.49  PROTIME-INR     Status: Abnormal   Collection Time    12/06/12  5:15 AM      Result Value Range   Prothrombin Time 21.7 (*) 11.6 - 15.2 seconds   INR 1.96 (*) 0.00 - 1.49    Physical exam:  General: Alert and oriented x 3, No apparent distress HEENT: Head is normocephalic, atraumatic, PERRLA, EOMI, sclera anicteric, oral mucosa pink and moist, dentition intact, ext ear canals clear,  Neck: Supple without JVD or lymphadenopathy Heart: Reg rate and rhythm. No murmurs rubs or gallops Chest: CTA bilaterally without wheezes, rales, or rhonchi; no distress Abdomen: Soft, non-tender, non-distended, bowel sounds positive. Extremities: No clubbing, cyanosis, or edema. Pulses are 2+ Skin: surgical site healing with mild sero-sanginuous drainage. Tender. Appropriate appearing. Skin flaky/dry Neuro: Pt is cognitively appropriate with normal insight, memory, and awareness. Cranial nerves 2-12 are intact. Distal loss of FT/PP. Reflexes are 1+ in all 4's. Fine motor coordination is intact. No tremors. Motor function is grossly 5/5 except for amputated leg which is 3+ with flexion, extension Musculoskeletal: intact extension, flexion Psych: Pt's affect is  appropriate. Pt is cooperative       Assessment/Plan: 1. Functional deficits secondary to left BKA which require 3+ hours per day of interdisciplinary therapy in a comprehensive inpatient rehab setting. Physiatrist is providing close team supervision and 24 hour management of active medical problems listed below. Physiatrist and rehab team continue to assess barriers to discharge/monitor patient progress toward functional and medical goals.      FIM: FIM - Bathing Bathing Steps Patient Completed: Chest;Right Arm;Left Arm;Abdomen;Front perineal area;Buttocks;Right upper leg;Left upper leg;Right lower leg (including foot) Bathing: 4: Steadying assist (sit<>stand position)  FIM - Upper Body Dressing/Undressing Upper body dressing/undressing steps patient completed: Thread/unthread right sleeve of pullover shirt/dresss;Thread/unthread left sleeve of pullover shirt/dress;Put head through opening of pull over shirt/dress;Pull shirt over trunk Upper body dressing/undressing: 5: Set-up assist to: Obtain clothing/put away FIM - Lower Body Dressing/Undressing Lower body dressing/undressing steps patient completed: Thread/unthread right pants leg;Thread/unthread left pants leg;Pull pants up/down;Don/Doff right sock;Don/Doff right shoe;Fasten/unfasten right shoe Lower body dressing/undressing: 4: Steadying Assist (sit<>stand position)  FIM - Toileting Toileting steps completed by patient: Adjust clothing prior to toileting Toileting Assistive Devices: Grab bar or rail for support Toileting: 2: Max-Patient completed 1 of 3 steps  FIM - Diplomatic Services operational officer Devices: Best boy Transfers: 5-To toilet/BSC: Supervision (verbal cues/safety issues);5-From toilet/BSC: Supervision (verbal cues/safety issues)  FIM - Bed/Chair Transfer Bed/Chair Transfer Assistive Devices: Bed rails;Arm rests Bed/Chair Transfer: 5: Bed > Chair or W/C: Supervision (verbal cues/safety  issues);5: Chair or W/C > Bed: Supervision (verbal cues/safety issues)  FIM -  Locomotion: Wheelchair Distance: 200' Locomotion: Wheelchair: 5: Travels 150 ft or more: maneuvers on rugs and over door sills with supervision, cueing or coaxing FIM - Locomotion: Ambulation Locomotion: Ambulation Assistive Devices: Designer, industrial/product Ambulation/Gait Assistance: 5: Supervision Locomotion: Ambulation: 1: Travels less than 50 ft with supervision/safety issues  Comprehension Comprehension Mode: Auditory Comprehension: 6-Follows complex conversation/direction: With extra time/assistive device  Expression Expression Mode: Verbal Expression: 6-Expresses complex ideas: With extra time/assistive device  Social Interaction Social Interaction: 6-Interacts appropriately with others with medication or extra time (anti-anxiety, antidepressant).  Problem Solving Problem Solving: 5-Solves complex 90% of the time/cues < 10% of the time  Memory Memory: 7-Complete Independence: No helper  Medical Problem List and Plan:  1. DVT Prophylaxis/Anticoagulation: Pharmaceutical: Coumadin  2. Chronic Pain Management: Has spinal cord stimulator. Continue Fentanyl patch, OxyContin, Oxycodone increased to 10mg  for breakthrough pain, Topamax and Cymbalta. Poor pain coping skills in general  -robaxin for muscle spasms  -pt reports to me that pain is improving. 3. Mood: "dependent personality"  4. Neuropsych: This patient is capable of making decisions on his own behalf.  5. ABLA on chronic anemia: Fe supplement. 9.6 6. O2 dependent/OSA: Encouraged to wear BIPAP at nights to help with sleep hygiene as well as energy levels. Continue oxygen as needed. 7. Morbid Obesity: s/p stomach stapling. . Will consult dietician to assist with nutritional needs.  8. DM type 2: diet controlled at present 9. Hypokalemia: Increased supplement to bid and levels back to normal 10. COPD: continue Singulair and Dulera. Nebs, nasal sprays  initiated in addition to delsym   -wean meds   -symptoms improved 11. Wound--dry dressing daily, ACE  LOS (Days) 10 A FACE TO FACE EVALUATION WAS PERFORMED  SWARTZ,ZACHARY T 12/07/2012, 6:41 AM

## 2012-12-07 NOTE — Progress Notes (Addendum)
ANTICOAGULATION CONSULT NOTE - Follow Up Consult  Pharmacy Consult:  Coumadin Indication: PVD, history of DVTs  No Known Allergies  Patient Measurements: Height: 5\' 10"  (177.8 cm) Weight: 233 lb 7.5 oz (105.9 kg) IBW/kg (Calculated) : 73  Vital Signs:    Labs:  Recent Labs  12/05/12 0500 12/06/12 0515 12/07/12 0615  LABPROT 25.1* 21.7* 19.7*  INR 2.37* 1.96* 1.72*    Estimated Creatinine Clearance: 97.5 ml/min (by C-G formula based on Cr of 0.97).    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  INR sub-therapeutic today; no bleeding reported.  Patient to complete Levaquin course and anticipate the need of higher Coumadin doses.   Goal of Therapy:  INR 2-3     Plan:  - Coumadin 12.5mg  PO today - Daily PT / INR  Bayard Hugger, PharmD, BCPS  Clinical Pharmacist  Pager: [phone]   12/07/2012, 1:56 PM

## 2012-12-07 NOTE — Progress Notes (Signed)
Occupational Therapy Weekly Progress Note & Session Note  Patient Details  Name: Jeremy [name] MRN: [id number] Date of Birth: 1951/03/09  Today's Date: 12/07/2012  WEEKLY PROGRESS NOTE  Patient is making good, steady progress on CIR. Patient continues to take more than reasonable amount of time to complete tasks, but completes most of them safely. Initially, ELOS=7-10 days, patient is staying for 12 days so he will be able to safely ambulate around house and in/out of bathroom using rolling walker for mod I toilet transfers and toileting. Patient's wife plans to be with patient ~1week once patient is initially discharged, after that patient will be home alone most of the day. No family has been present for education at this time.   Patient continues to demonstrate the following deficits: decreased overall activity tolerance/endurance, decreased dynamic standing balance/tolerance/endurance, decreased independence with BADLs, decreased independence with IADLs, decreased independence with functional transfers, and decreased UE strength. Therefore, patient will continue to benefit from skilled OT intervention to enhance overall performance with BADL, iADL and Reduce care partner burden.  Patient progressing toward long term goals..  Continue plan of care.  Skilled Therapeutic Interventions/Progress Updates:  Financial controller;Functional mobility training;Pain management;Patient/family education;Self Care/advanced ADL retraining;Therapeutic Activities;Therapeutic Exercise;UE/LE Strength taining/ROM;UE/LE Coordination activities;Community reintegration;Psychosocial support;Splinting/orthotics;Neuromuscular re-education;Skin care/wound managment;Wheelchair propulsion/positioning   Precautions:  Precautions Precautions: Fall Precaution Comments: Chronic O2 at 3L (prior to admission) Restrictions Weight Bearing Restrictions: Yes LLE Weight  Bearing: Non weight bearing  See FIM for current functional status  ----------------------------------------------------------------------------------------------------------------------------  SESSION NOTE  0935-1030 - 55 Minutes Individual Therapy No complaints of pain Patient found supine in bed. Patient engaged in bed mobility and ambulated from edge of bed -> w/c ~5 feet with distant supervision. From here patient performed UB bathing & dressing seated at sink; patient independent for UB dressing (patient gathered shirt). From here, therapist educated patient on compression wrapping. Therapist demonstrated compression wrapping and had patient return demonstrate compression wrapping using teach back method. From here patient propelled self from room -> therapy gym. Patient engaged in 15 minutes of ergometer on random at level 10. Patient performed frontwards and backwards exercise motions. Patient took three 1 minute rest breaks. After UE exercises, patient transferred -> mat at mod I level. Throughout session, patient on 3 liters of 02 via nasal cannula. At end of session, left patient supine on mat, in gym for next therapy session.   Camrie Stock 12/07/2012, 9:55 AM

## 2012-12-08 ENCOUNTER — Encounter (HOSPITAL_COMMUNITY): Payer: Medicare Other | Admitting: Occupational Therapy

## 2012-12-08 ENCOUNTER — Inpatient Hospital Stay (HOSPITAL_COMMUNITY): Payer: Medicare Other | Admitting: Occupational Therapy

## 2012-12-08 ENCOUNTER — Inpatient Hospital Stay (HOSPITAL_COMMUNITY): Payer: Medicare Other

## 2012-12-08 LAB — PROTIME-INR: INR: 1.9 — ABNORMAL HIGH (ref 0.00–1.49)

## 2012-12-08 MED ORDER — WARFARIN SODIUM 2.5 MG PO TABS
12.5000 mg | ORAL_TABLET | Freq: Once | ORAL | Status: AC
  Administered 2012-12-08: 12.5 mg via ORAL
  Filled 2012-12-08: qty 1

## 2012-12-08 NOTE — Progress Notes (Signed)
Orthopedic Tech Progress Note Patient Details:  Jeremy [name] 18-Feb-1951 [id number]  Patient ID: Jeremy [name], male   DOB: 1950-07-01, 62 y.o.   MRN: [id number]   Jeremy [name] 12/08/2012, 9:18 AMBK shrinker completed by advanced.

## 2012-12-08 NOTE — Progress Notes (Signed)
Occupational Therapy Session Note  Patient Details  Name: Jeremy [name] MRN: [id number] Date of Birth: 1950/10/26  Today's Date: 12/08/2012 Time: 1130-1200 Time Calculation (min): 30 min  Short Term Goals: Week 1:  OT Short Term Goal 1 (Week 1): STGs = LTGs based on LOS  Skilled Therapeutic Interventions/Progress Updates: ADL-retraining (15 min) with emphasis on tub/tub-bench transfers, w/c management, and home safety needs.  Therapeutic activities (15 min) with emphasis on re-ed on HEP using theraband for bil UE strengthening.   Patient performed semi squat-pivot sliding transfer from w/c to tub bench in standard tub with supervision assist and discovered that his new w/c might still move during transfer.   OT and patient reviewed function of w/c brake and limitations of fully functional DME.   Patient related that although he could transfer (as evidenced this date) he will only shower/bathe with his wife present during transfers to provide additional safety/oversight.   Patient also reported that he had no equipment to perform HEP to improve strength of bil UE while at home until OT demo'd use of theraband again.   Patient was re-educated on shoulder, chest, and arm exercises and was educated on benefit of reviewing additional videos and instruction at [email or web address].  Therapy Documentation Precautions:  Precautions Precautions: Fall Precaution Comments: Chronic O2 at 3L (prior to admission) Restrictions Weight Bearing Restrictions: Yes LLE Weight Bearing: Non weight bearing Other Position/Activity Restrictions: WBAT L LE  Pain: Pain Assessment Pain Assessment: No/denies pain Pain Score: 5  Faces Pain Scale: No hurt Pain Type: Surgical pain Pain Location: Leg Pain Orientation: Left Pain Descriptors / Indicators: Aching;Discomfort Patients Stated Pain Goal: 3 Pain Intervention(s): Medication (See eMAR)  See FIM for current functional status  Therapy/Group:  Individual Therapy  Georgeanne Nim 12/08/2012, 12:53 PM

## 2012-12-08 NOTE — Progress Notes (Signed)
Physical Therapy Discharge Summary  Patient Details  Name: Jeremy [name] MRN: [id number] Date of Birth: 28-Mar-1951  Today's Date: 12/09/2012  Time: 1051-1106 (15 min) Individual therapy; Session focused on family education with pt and pt's wife in regards to breakdown of w/c for decreased weight to transport in/out of car. Demonstrated and had pt's wife return demonstrate removing and replacing wheels on w/c and folding up chair for transport. Pt and pt's wife deny any further questions and verbalize feeling prepared for d/c today.  Patient has met 11 of 11 long term goals due to improved activity tolerance, improved balance, increased strength, increased range of motion, decreased pain and ability to compensate for deficits.  Patient to discharge at a wheelchair level Modified Independent and up to 20' mod I with RW. Patient's care partner is independent to provide the necessary supervision assistance at discharge.  Reasons goals not met: n/a  Recommendation:  Patient will benefit from ongoing skilled PT services in home health setting to continue to advance safe functional mobility, address ongoing impairments in functional strength, ROM, balance pre-prosthetic training, endurance, and minimize fall risk.  Equipment: 20x18 w/c with basic cushion and L amputee pad; wide RW  Reasons for discharge: treatment goals met and discharge from hospital  Patient/family agrees with progress made and goals achieved: Yes  PT Discharge Precautions/Restrictions Precautions Precautions: Fall Precaution Comments: Chronic O2 at 3L (prior to admission) Restrictions LLE Weight Bearing: Non weight bearing Vision/Perception  Vision - History Baseline Vision: Wears glasses only for reading Perception Perception: Within Functional Limits Praxis Praxis: Intact  Cognition Overall Cognitive Status: Within Functional Limits for tasks assessed Safety/Judgment: Appears intact Sensation Sensation Light  Touch: Appears Intact (LLE wrapped; NT) Coordination Gross Motor Movements are Fluid and Coordinated: Yes Motor  Motor Motor: Within Functional Limits (L new BKA)  Locomotion  Ambulation Ambulation/Gait Assistance: 6: Modified independent (Device/Increase time) Ambulation Distance (Feet): 20 Feet Assistive device: Rolling walker  Trunk/Postural Assessment  Cervical Assessment Cervical Assessment: Within Functional Limits Thoracic Assessment Thoracic Assessment: Within Functional Limits Lumbar Assessment Lumbar Assessment: Within Functional Limits Postural Control Postural Control: Within Functional Limits  Balance Balance Balance Assessed: Yes Dynamic Sitting Balance Dynamic Sitting - Level of Assistance: 6: Modified independent (Device/Increase time) Static Standing Balance Static Standing - Level of Assistance: 6: Modified independent (Device/Increase time) Dynamic Standing Balance Dynamic Standing - Level of Assistance: 6: Modified independent (Device/Increase time) (with RW) Extremity Assessment  RLE Assessment RLE Assessment: Within Functional Limits (decreased muscular endurance) LLE Assessment LLE Assessment: Exceptions to [facility] (lacking a few degrees ext at knee; strength 4/5)  See FIM for current functional status  Lavi, [facility] 12/09/2012, 10:52 AM

## 2012-12-08 NOTE — Progress Notes (Signed)
Occupational Therapy Session Note  Patient Details  Name: Jeremy [name] MRN: [id number] Date of Birth: Sep 15, 1950  Today's Date: 12/08/2012 Time: 1345-1430 Time Calculation (min): 45 min  Short Term Goals: Week 1:  OT Short Term Goal 1 (Week 1): STGs = LTGs based on LOS  Skilled Therapeutic Interventions/Progress Updates:  Patient sleeping in bed upon arrival.  Focused session on functional mobility and IADL to include bed><w/c transfers and simple food prep task in therapy kitchen at a w/c level.  Patient reports that he is used to a long O2 tube at home however now he will need to consider safety regarding getting tangled up when using w/c and RW with mobility in the house.  Patient with several opportunities to untangle tubing from w/c during kitchen task and states that he will contact the company who supplies his O2 about providing a small tank that he can place in his walker bag.  Patient demonstrated Mod I with kitchen tasks to include unloading and loading dishwasher and using sink.  Therapy Documentation Precautions:  Precautions Precautions: Fall Precaution Comments: Chronic O2 at 3L (prior to admission) Restrictions Weight Bearing Restrictions: Yes LLE Weight Bearing: Non weight bearing Pain: Denies pain  Therapy/Group: Individual Therapy  Willowdean Luhmann 12/08/2012, 4:34 PM

## 2012-12-08 NOTE — Progress Notes (Signed)
Physical Therapy Session Note  Patient Details  Name: Jeremy [name] MRN: [id number] Date of Birth: [birth year]  Today's Date: 12/08/2012 Time: 1030-1130 Time Calculation (min): 60 min  Short Term Goals: Week 2:  PT Short Term Goal 1 (Week 2): = LTGs  Skilled Therapeutic Interventions/Progress Updates:    Session focused on grad day activities and finalizing d/c planning. Pt completing all transfers with and without RW (squat pivot) at mod I level managing all parts of w/c in ADL apartment bed and on/off mat/w/c. Short distance gait to simulate needs for home environment x 20' mod I. Discussed energy conservation techniques as well. Mod I for car transfer with RW and demonstrated w/c breakdown to patient (will schedule session to review with wife tomorrow before D/C). Simulated community environment w/c mobility including navigating obstacles and propelling over mat for simulated uneven surface. Pt with questions in regards to stairs (1 threshold step at his mom's house) and demonstrated options to patient but pt declined attempting at this time. Pt stated he would prefer to wait til he got stronger and would talk to HHPT.Pt made mod I in room. No further questions at this time.  Therapy Documentation Precautions:  Precautions Precautions: Fall Precaution Comments: Chronic O2 at 3L (prior to admission) Restrictions Weight Bearing Restrictions: Yes LLE Weight Bearing: Non weight bearing Other Position/Activity Restrictions: WBAT L LE   Pain: Reports pain beginning at end of session - RN notified.  Locomotion : Ambulation Ambulation/Gait Assistance: 6: Modified independent (Device/Increase time)   See FIM for current functional status  Therapy/Group: Individual Therapy  [place], [facility] 12/08/2012, 12:04 PM

## 2012-12-08 NOTE — Progress Notes (Signed)
Orthopedic Tech Progress Note Patient Details:  Jeremy [name] 08/05/1950 [id number]  Patient ID: Jeremy [name], male   DOB: 11-27-1950, 62 y.o.   MRN: [id number]   [name] 12/08/2012, 8:17 AMCalled advanced left shrinker sock.

## 2012-12-08 NOTE — Progress Notes (Signed)
ANTICOAGULATION CONSULT NOTE - Follow Up Consult  Pharmacy Consult:  Coumadin Indication: PVD, history of DVTs  No Known Allergies  Patient Measurements: Height: 5\' 10"  (177.8 cm) Weight: 233 lb 7.5 oz (105.9 kg) IBW/kg (Calculated) : 73  Vital Signs: Temp: 98.7 F (37.1 C) (08/19 0521) Temp src: Oral (08/19 0521) BP: 104/58 mmHg (08/19 0521) Pulse Rate: 49 (08/19 0521)  Labs:  Recent Labs  12/06/12 0515 12/07/12 0615 12/08/12 0500  LABPROT 21.7* 19.7* 21.2*  INR 1.96* 1.72* 1.90*    Estimated Creatinine Clearance: 97.5 ml/min (by C-G formula based on Cr of 0.97).    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  INR (1.9) sub-therapeutic today, but trending up; no bleeding reported.  Patient to complete Levaquin course and anticipate the need of higher Coumadin doses.   Goal of Therapy:  INR 2-3     Plan:  - Coumadin 12.5mg  PO today - Daily PT / INR  Bayard Hugger, PharmD, BCPS  Clinical Pharmacist  Pager: [phone]   12/08/2012, 2:10 PM

## 2012-12-08 NOTE — Progress Notes (Signed)
Patient ID: Jeremy [name], male   DOB: Sep 25, 1950, 62 y.o.   MRN: [id number] Subjective/Complaints: No complaints. Pain under control.  Review of Systems  Musculoskeletal: Positive for joint pain.  Neurological: Positive for sensory change.  All other systems reviewed and are negative.   Objective: Vital Signs: Blood pressure 104/58, pulse 49, temperature 98.7 F (37.1 C), temperature source Oral, resp. rate 18, height 5\' 10"  (1.778 m), weight 105.9 kg (233 lb 7.5 oz), SpO2 98.00%. No results found. Results for orders placed during the hospital encounter of 11/27/12 (from the past 72 hour(s))  PROTIME-INR     Status: Abnormal   Collection Time    12/06/12  5:15 AM      Result Value Range   Prothrombin Time 21.7 (*) 11.6 - 15.2 seconds   INR 1.96 (*) 0.00 - 1.49  PROTIME-INR     Status: Abnormal   Collection Time    12/07/12  6:15 AM      Result Value Range   Prothrombin Time 19.7 (*) 11.6 - 15.2 seconds   INR 1.72 (*) 0.00 - 1.49  PROTIME-INR     Status: Abnormal   Collection Time    12/08/12  5:00 AM      Result Value Range   Prothrombin Time 21.2 (*) 11.6 - 15.2 seconds   INR 1.90 (*) 0.00 - 1.49    Physical exam:  General: Alert and oriented x 3, No apparent distress HEENT: Head is normocephalic, atraumatic, PERRLA, EOMI, sclera anicteric, oral mucosa pink and moist, dentition intact, ext ear canals clear,  Neck: Supple without JVD or lymphadenopathy Heart: Reg rate and rhythm. No murmurs rubs or gallops Chest: CTA bilaterally without wheezes, rales, or rhonchi; no distress Abdomen: Soft, non-tender, non-distended, bowel sounds positive. Extremities: No clubbing, cyanosis, or edema. Pulses are 2+ Skin: surgical site healing with mild sero-sanginuous drainage. Tender. Appropriate appearing. Skin flaky/dry Neuro: Pt is cognitively appropriate with normal insight, memory, and awareness. Cranial nerves 2-12 are intact. Distal loss of FT/PP. Reflexes are 1+ in all 4's. Fine motor  coordination is intact. No tremors. Motor function is grossly 5/5 except for amputated leg which is 3+ with flexion, extension Musculoskeletal: intact extension, flexion Psych: Pt's affect is appropriate. Pt is cooperative       Assessment/Plan: 1. Functional deficits secondary to left BKA which require 3+ hours per day of interdisciplinary therapy in a comprehensive inpatient rehab setting. Physiatrist is providing close team supervision and 24 hour management of active medical problems listed below. Physiatrist and rehab team continue to assess barriers to discharge/monitor patient progress toward functional and medical goals.   Family ed today. Will have Advanced bring shrinker sock.     FIM: FIM - Bathing Bathing Steps Patient Completed: Chest;Right Arm;Left Arm;Abdomen Bathing: 5: Set-up assist to: Obtain items (UB bathing)  FIM - Upper Body Dressing/Undressing Upper body dressing/undressing steps patient completed: Thread/unthread right sleeve of pullover shirt/dresss;Thread/unthread left sleeve of pullover shirt/dress;Put head through opening of pull over shirt/dress;Pull shirt over trunk Upper body dressing/undressing: 7: Complete Independence: No helper FIM - Lower Body Dressing/Undressing Lower body dressing/undressing steps patient completed: Thread/unthread right pants leg;Thread/unthread left pants leg;Pull pants up/down;Don/Doff right sock;Don/Doff right shoe;Fasten/unfasten right shoe Lower body dressing/undressing: 0: Activity did not occur (patient stated he already had the pants on he wanted to wear)  FIM - Toileting Toileting steps completed by patient: Adjust clothing prior to toileting Toileting Assistive Devices: Grab bar or rail for support Toileting: 0: Activity did not occur  FIM - Diplomatic Services operational officer Devices: Best boy Transfers: 0-Activity did not occur  FIM - Banker  Devices: Arm rests Bed/Chair Transfer: 6: Supine > Sit: No assist;6: Sit > Supine: No assist;5: Bed > Chair or W/C: Supervision (verbal cues/safety issues);5: Chair or W/C > Bed: Supervision (verbal cues/safety issues)  FIM - Locomotion: Wheelchair Distance: 200' Locomotion: Wheelchair: 0: Activity did not occur FIM - Locomotion: Ambulation Locomotion: Ambulation Assistive Devices: Designer, industrial/product Ambulation/Gait Assistance: 5: Supervision Locomotion: Ambulation: 1: Travels less than 50 ft with supervision/safety issues  Comprehension Comprehension Mode: Auditory Comprehension: 6-Follows complex conversation/direction: With extra time/assistive device  Expression Expression Mode: Verbal Expression: 6-Expresses complex ideas: With extra time/assistive device  Social Interaction Social Interaction: 6-Interacts appropriately with others with medication or extra time (anti-anxiety, antidepressant).  Problem Solving Problem Solving: 6-Solves complex problems: With extra time  Memory Memory: 6-More than reasonable amt of time  Medical Problem List and Plan:  1. DVT Prophylaxis/Anticoagulation: Pharmaceutical: Coumadin  2. Chronic Pain Management: Has spinal cord stimulator. Continue Fentanyl patch, OxyContin, Oxycodone increased to 10mg  for breakthrough pain, Topamax and Cymbalta. Poor pain coping skills in general  -robaxin for muscle spasms  -pain better. 3. Mood: "dependent personality"  4. Neuropsych: This patient is capable of making decisions on his own behalf.  5. ABLA on chronic anemia: Fe supplement 9.6 6. O2 dependent/OSA: Encouraged to wear BIPAP at nights to help with sleep hygiene as well as energy levels. Continue oxygen as needed. 7. Morbid Obesity: s/p stomach stapling. . Will consult dietician to assist with nutritional needs.  8. DM type 2: diet controlled at present 9. Hypokalemia: Increased supplement to bid and levels back to normal 10. COPD: continue Singulair  and Dulera. Nebs, nasal sprays initiated in addition to delsym  -wean meds   -symptoms improved 11. Wound--dry dressing daily, ACE  LOS (Days) 11 A FACE TO FACE EVALUATION WAS PERFORMED  Ollivander See T 12/08/2012, 7:37 AM

## 2012-12-08 NOTE — Progress Notes (Signed)
Occupational Therapy Session Note & Discharge Summary  Patient Details  Name: Jeremy [name] MRN: [id number] Date of Birth: 10/03/1950  Today's Date: 12/08/2012  SESSION NOTE Time: 4098-1191 Time Calculation (min): 55 min Individual Therapy Patient with no complaints of pain Patient found supine in bed upon entering room. Therapist and patient got patient's personal chair ready to use. Patient then transferred into chair at a mod I level. From here patient performed UB/LB bathing at sink in sit<>stand position, requiring set-up assist (no supervision). From here patient performed UB dressing independently and LB dressing at mod I level. Afterwards, patient ambulated into bathroom for toileting needs at mod I level (toileting tasks and toilet transfer). Patient ambulated back to w/c and therapist left patient seated in w/c for next therapy session.   ---------------------------------------------------------------------------------------------------------------------------------------------------------  DISCHARGE SUMMARY Patient has met 8 of 8 long term goals due to improved activity tolerance, improved balance, postural control, ability to compensate for deficits, improved attention, improved awareness and improved coordination.  Patient to discharge at overall Set-up -> Mod I  level.  Patient's care partner has not been present for any education. Patient only requires set-up assistance for bathing tasks.  Reasons goals not met: n/a, all goals met at this time.   Recommendation:  No additional occupational therapy recommended at this time. Patient performing at a set-up level for bathing & dressing (wife can assist) and mod I for toileting and toilet transfers.  Equipment: tub transfer bench and BSC  Reasons for discharge: treatment goals met and discharge from hospital  Patient/family agrees with progress made and goals achieved: Yes  Precautions/Restrictions  Precautions Precautions:  Fall Precaution Comments: Chronic O2 at 3L (prior to admission) Restrictions Weight Bearing Restrictions: Yes LLE Weight Bearing: Non weight bearing Other Position/Activity Restrictions: WBAT L LE  Pain Pain Assessment Pain Assessment: No/denies pain Pain Score: 0-No pain Faces Pain Scale: No hurt Pain Type: Surgical pain Pain Location: Leg Pain Orientation: Left Pain Descriptors / Indicators: Aching Pain Intervention(s): Medication (See eMAR)  ADL - See FIM  Vision/Perception  Vision - History Baseline Vision: Wears glasses only for reading Patient Visual Report: No change from baseline Vision - Assessment Vision Assessment: Vision not tested Perception Perception: Within Functional Limits Praxis Praxis: Intact   Cognition Overall Cognitive Status: Within Functional Limits for tasks assessed Arousal/Alertness: Awake/alert Orientation Level: Oriented X4 Memory: Appears intact Awareness: Appears intact Problem Solving: Appears intact  Sensation Sensation Light Touch: Appears Intact (BUE) Stereognosis: Appears Intact (BUE) Hot/Cold: Appears Intact (BUE) Proprioception: Appears Intact (BUE) Coordination Gross Motor Movements are Fluid and Coordinated: Yes Fine Motor Movements are Fluid and Coordinated: Yes  Motor - See Discharge Navigator   Trunk/Postural Assessment  - See Discharge Navigator   Balance - See Discharge Navigator   Extremity/Trunk Assessment RUE Assessment RUE Assessment: Within Functional Limits LUE Assessment LUE Assessment: Within Functional Limits  See FIM for current functional status  [name] 12/08/2012, 11:09 AM

## 2012-12-09 ENCOUNTER — Ambulatory Visit (HOSPITAL_COMMUNITY): Payer: Medicare Other

## 2012-12-09 DIAGNOSIS — I739 Peripheral vascular disease, unspecified: Secondary | ICD-10-CM

## 2012-12-09 DIAGNOSIS — S88119A Complete traumatic amputation at level between knee and ankle, unspecified lower leg, initial encounter: Secondary | ICD-10-CM

## 2012-12-09 DIAGNOSIS — L98499 Non-pressure chronic ulcer of skin of other sites with unspecified severity: Secondary | ICD-10-CM

## 2012-12-09 LAB — PROTIME-INR
INR: 0.98 (ref 0.00–1.49)
Prothrombin Time: 12.8 seconds (ref 11.6–15.2)

## 2012-12-09 MED ORDER — FENTANYL 25 MCG/HR TD PT72: 1.0000 | MEDICATED_PATCH | TRANSDERMAL | Status: DC

## 2012-12-09 MED ORDER — POTASSIUM CHLORIDE CRYS ER 20 MEQ PO TBCR: 20.0000 meq | EXTENDED_RELEASE_TABLET | Freq: Two times a day (BID) | ORAL | Status: DC

## 2012-12-09 MED ORDER — WARFARIN SODIUM 5 MG PO TABS: 7.5000 mg | ORAL_TABLET | Freq: Every day | ORAL | Status: DC

## 2012-12-09 MED ORDER — WARFARIN SODIUM 2.5 MG PO TABS
12.5000 mg | ORAL_TABLET | Freq: Once | ORAL | Status: DC
  Filled 2012-12-09: qty 1

## 2012-12-09 MED ORDER — OXYCODONE HCL 10 MG PO TABS: 10.0000 mg | ORAL_TABLET | Freq: Four times a day (QID) | ORAL | Status: DC | PRN

## 2012-12-09 MED ORDER — ADULT MULTIVITAMIN W/MINERALS CH: 1.0000 | ORAL_TABLET | Freq: Every day | ORAL | Status: AC

## 2012-12-09 MED ORDER — OXYCODONE HCL 5 MG PO TABS: 10.0000 mg | ORAL_TABLET | Freq: Every day | ORAL | Status: DC | PRN

## 2012-12-09 MED ORDER — FERROUS SULFATE 325 (65 FE) MG PO TABS: 325.0000 mg | ORAL_TABLET | Freq: Three times a day (TID) | ORAL | Status: DC

## 2012-12-09 MED ORDER — WARFARIN SODIUM 5 MG PO TABS: 15.0000 mg | ORAL_TABLET | Freq: Every day | ORAL | Status: DC

## 2012-12-09 NOTE — Progress Notes (Signed)
ANTICOAGULATION CONSULT NOTE - Follow Up Consult  Pharmacy Consult:  Coumadin Indication: PVD, history of DVTs  No Known Allergies  Patient Measurements: Height: 5\' 10"  (177.8 cm) Weight: 234 lb 2.1 oz (106.2 kg) IBW/kg (Calculated) : 73  Vital Signs: Temp: 98.1 F (36.7 C) (08/20 0536) Temp src: Oral (08/20 0536) BP: 94/55 mmHg (08/20 0536) Pulse Rate: 62 (08/20 0536)  Labs:  Recent Labs  12/08/12 0500 12/09/12 0620 12/09/12 0930  LABPROT 21.2* 12.8 22.2*  INR 1.90* 0.98 2.02*    Estimated Creatinine Clearance: 97.6 ml/min (by C-G formula based on Cr of 0.97).    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  INR therapeutic today; no bleeding reported.  Patient to complete Levaquin course and anticipate the need of higher Coumadin doses.   Goal of Therapy:  INR 2-3     Plan:  - Coumadin 12.5mg  PO today - Daily PT / INR  Bayard Hugger, PharmD, BCPS  Clinical Pharmacist  Pager: [phone]   12/09/2012, 12:12 PM

## 2012-12-09 NOTE — Progress Notes (Signed)
NUTRITION FOLLOW UP  Intervention:   1.  General healthful diet; continue to encourage intake as needed. 2.  Supplements; discontinue supplements.  Pt is healing well and intake is adequate to meet calorie and protein needs.  Recommend continue MVI at d/c.  Nutrition Dx:   Inadequate oral intake, ongoing.  Monitor:   1.  Food/Beverage; pt meeting >/=90% estimated needs with tolerance. 2.  Wt/wt change; monitor trends  Assessment:   Pt admitted to CIR s/p BKA.  Pt with h/o morbid obesity. 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 Pt has had his stomach stapled. Per chart review, pt highest wt is 340 lbs. 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  Pt has continued with good intake at 100% of meals consistently.  Will discontinue protein supplement as this is not needed at this time, recommend continuing MVI.    Height: Ht Readings from Last 1 Encounters:  11/28/12 5\' 10"  (1.778 m)    Weight Status:   Wt Readings from Last 1 Encounters:  12/09/12 234 lb 2.1 oz (106.2 kg)    Re-estimated needs:  Kcal: 2000-2150 Protein: 110-130g Fluid: >2.0 L/day  Skin: incision  Diet Order: Carb Control   Intake/Output Summary (Last 24 hours) at 12/09/12 1139 Last data filed at 12/09/12 0845  Gross per 24 hour  Intake    720 ml  Output   1550 ml  Net   -830 ml    Last BM: 8/18  Labs:  No results found for this basename: NA, K, CL, CO2, BUN, CREATININE, CALCIUM, MG, PHOS, GLUCOSE,  in the last 168 hours  CBG (last 3)  No results found for this basename: GLUCAP,  in the last 72 hours  Scheduled Meds: . dextromethorphan  15 mg Oral BID  . DULoxetine  30 mg Oral Daily  . feeding supplement  30 mL Oral Daily  . fentaNYL  25 mcg Transdermal Q72H  . ferrous sulfate  325 mg Oral TID WC  . fluocinonide-emollient   Topical TID  . furosemide  40 mg Oral Q1500  . montelukast  10 mg Oral QHS  . multivitamin with minerals  1 tablet Oral Daily  . OxyCODONE  15  mg Oral Q12H  . potassium chloride SA  20 mEq Oral BID  . simvastatin  5 mg Oral q1800  . sodium chloride  1 spray Each Nare BID  . topiramate  100 mg Oral BID  . Warfarin - Pharmacist Dosing Inpatient   Does not apply q1800    Continuous Infusions:   Loyce Dys, MS RD LDN Clinical Inpatient Dietitian Pager: [phone] Weekend/After hours pager: [phone]

## 2012-12-09 NOTE — Plan of Care (Signed)
Problem: RH SKIN INTEGRITY Goal: RH STG ABLE TO PERFORM INCISION/WOUND CARE W/ASSISTANCE STG Able To Perform Incision/Wound Care With Assistance. Min A  Outcome: Completed/Met Date Met:  12/09/12 Pt and wife demonstrated stump shrinker change

## 2012-12-09 NOTE — Discharge Summary (Addendum)
Physician Discharge Summary  Patient ID: Jeremy [name] MRN: [id number] DOB/AGE: 1950-11-20 62 y.o.  Admit date: 11/27/2012 Discharge date: 12/09/2012  Discharge Diagnoses:  Principal Problem:   Unilateral complete BKA--left Active Problems:   PVD (peripheral vascular disease)   COPD (chronic obstructive pulmonary disease)   Psoriasis   Diabetes mellitus with peripheral vascular disease   Discharged Condition: Improved  Significant Diagnostic Studies: Dg Chest 2 View  11/30/2012   *RADIOLOGY REPORT*  Clinical Data: Cough, shortness of breath  CHEST - 2 VIEW  Comparison: 11/16/2012  Findings: Cardiomegaly again noted.  No pulmonary edema.  A spinal stimulation wire lower thoracic spine again noted. No segmental infiltrate or pleural effusion.  Mild perihilar increased bronchial markings without focal consolidation.  Bony thorax is stable.  IMPRESSION: No acute infiltrate or pulmonary edema.  Mild perihilar increased bronchial markings without focal consolidation.   Original Report Authenticated By: Natasha Mead, M.D.     Labs:  Basic Metabolic Panel:    Component Value Date/Time   NA 141 11/30/2012 0500   K 3.7 11/30/2012 0500   CL 103 11/30/2012 0500   CO2 29 11/30/2012 0500   GLUCOSE 98 11/30/2012 0500   BUN 22 11/30/2012 0500   CREATININE 0.97 11/30/2012 0500   CALCIUM 8.6 11/30/2012 0500   GFRNONAA 87* 11/30/2012 0500   GFRAA >90 11/30/2012 0500     CBC:     Component Value Date/Time   WBC 3.9* 11/30/2012 0500   RBC 3.06* 11/30/2012 0500   HGB 9.6* 11/30/2012 0500   HCT 29.0* 11/30/2012 0500   PLT 173 11/30/2012 0500   MCV 94.8 11/30/2012 0500   MCH 31.4 11/30/2012 0500   MCHC 33.1 11/30/2012 0500   RDW 13.5 11/30/2012 0500   LYMPHSABS 1.1 11/30/2012 0500   MONOABS 1.0 11/30/2012 0500   EOSABS 0.1 11/30/2012 0500   BASOSABS 0.0 11/30/2012 0500    Pro time/INR and CBG check:  PROTIME-INR     Status: Abnormal   Collection Time    11/28/12  5:30 AM      Result Value Range    Prothrombin Time 20.5 (*) 11.6 - 15.2 seconds   INR 1.82 (*) 0.00 - 1.49  PROTIME-INR     Status: Abnormal   Collection Time    11/29/12  6:20 AM      Result Value Range   Prothrombin Time 20.5 (*) 11.6 - 15.2 seconds   INR 1.82 (*) 0.00 - 1.49  GLUCOSE, CAPILLARY     Status: Abnormal   Collection Time    11/30/12  4:32 PM      Result Value Range   Glucose-Capillary 103 (*) 70 - 99 mg/dL   Comment 1 Notify RN    GLUCOSE, CAPILLARY     Status: Abnormal   Collection Time    11/30/12  8:41 PM      Result Value Range   Glucose-Capillary 106 (*) 70 - 99 mg/dL   Comment 1 Notify RN    PROTIME-INR     Status: Abnormal   Collection Time    12/01/12  6:16 AM      Result Value Range   Prothrombin Time 25.1 (*) 11.6 - 15.2 seconds   INR 2.37 (*) 0.00 - 1.49  GLUCOSE, CAPILLARY     Status: None   Collection Time    12/01/12  7:27 AM      Result Value Range   Glucose-Capillary 99  70 - 99 mg/dL  GLUCOSE, CAPILLARY  Status: None   Collection Time    12/01/12 11:24 AM      Result Value Range   Glucose-Capillary 89  70 - 99 mg/dL  GLUCOSE, CAPILLARY     Status: None   Collection Time    12/01/12  4:14 PM      Result Value Range   Glucose-Capillary 93  70 - 99 mg/dL  PROTIME-INR     Status: Abnormal   Collection Time    12/02/12  5:25 AM      Result Value Range   Prothrombin Time 27.0 (*) 11.6 - 15.2 seconds   INR 2.61 (*) 0.00 - 1.49  PROTIME-INR     Status: Abnormal   Collection Time    12/03/12  6:55 AM      Result Value Range   Prothrombin Time 24.4 (*) 11.6 - 15.2 seconds   INR 2.28 (*) 0.00 - 1.49  PROTIME-INR     Status: Abnormal   Collection Time    12/04/12  6:35 AM      Result Value Range   Prothrombin Time 25.6 (*) 11.6 - 15.2 seconds   INR 2.43 (*) 0.00 - 1.49  PROTIME-INR     Status: Abnormal   Collection Time    12/05/12  5:00 AM      Result Value Range   Prothrombin Time 25.1 (*) 11.6 - 15.2 seconds   INR 2.37 (*) 0.00 - 1.49  PROTIME-INR     Status:  Abnormal   Collection Time    12/06/12  5:15 AM      Result Value Range   Prothrombin Time 21.7 (*) 11.6 - 15.2 seconds   INR 1.96 (*) 0.00 - 1.49  PROTIME-INR     Status: Abnormal   Collection Time    12/07/12  6:15 AM      Result Value Range   Prothrombin Time 19.7 (*) 11.6 - 15.2 seconds   INR 1.72 (*) 0.00 - 1.49  PROTIME-INR     Status: Abnormal   Collection Time    12/08/12  5:00 AM      Result Value Range   Prothrombin Time 21.2 (*) 11.6 - 15.2 seconds   INR 1.90 (*) 0.00 - 1.49  PROTIME-INR     Status: None   Collection Time    12/09/12  6:20 AM      Result Value Range   Prothrombin Time 12.8  11.6 - 15.2 seconds   INR 0.98  0.00 - 1.49  PROTIME-INR     Status: Abnormal   Collection Time    12/09/12  9:30 AM      Result Value Range   Prothrombin Time 22.2 (*) 11.6 - 15.2 seconds   INR 2.02 (*) 0.00 - 1.49    Brief HPI:   [name] is a 62 y.o. male with history of DM-diet controlled, peripheral neuropathy, COPD-O2 dependent, left leg ulcer X 20 years with recent worsening. Patient with I and D abscess 07/18 as well as IV antibiotics for MRSA/pseudomonas infection but continued to have poor healing with substantial pain affecting QOL. 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 Lajoyce Corners. Post op therapies initiated and patient with good participation and able to weight bear and take a couple of hopping steps. Wife has had respiratory issues and patient reported developing a cough. Therapy team recommended CIR and patient admitted today.    Hospital Course: [name] was admitted to rehab 11/27/2012 for inpatient therapies to consist  of PT, ST and OT at least three hours five days a week. Past admission physiatrist, therapy team and rehab RN have worked together to provide customized collaborative inpatient rehab. He has been afebrile during his stay.  Vitals and blood sugars have been stable. He did have worsening of his dry, non-productive cough and was treated with  Levaquin for bronchitis. CXR was negative for acute changes. Nebulizers and pulmonary toilet with acapella valve was initiated with improvement in respiratory symptoms.  His BKA incision has been healing well and has minimal serous drainage on dressing. No erythema or dehiscence noted. Oxycodone was increased to 10 mg qid to help with pain management. He has made good progress and requires intermittent supervision. Family education was done with wife on assistance needed.  He will continue to receive HHPT past discharge to transition to home.    Rehab course: During patient's stay in rehab weekly team conferences were held to monitor patient's progress, set goals and discuss barriers to discharge.Patient has had improvement in activity tolerance, balance, postural control, as well as ability to compensate for deficits. Occupational therapy has focused ADL tasks and endurance.  He is modified independent for bathing, dressing and simple home management tasks.  Physical therapy has focused on bed mobility, transfers, w/c propulsion, and gait. Patient is modified independent at wheelchair level. He is able to ambulate 20 feet at modified independent level with use of RW.   Disposition: 01-Home or Self Care  Diet: Diabetic  Special Instructions: 1. Wound Care:  Wash with soap and water, pat dry. Apply 4 X 4, Kerlix and cover with stump shrinker daily. Contact MD if you develop redness, fever, chills, purulent or increase in drainage or odor from wound.  2. Wear CPAP when asleep.  3. Use flutter valve four times a day.  4. HH RN to draw protime on 12/10/12 with results to Dr. Oscar La.     Future Appointments Provider Department Dept Phone   01/06/2013 10:00 AM Ranelle Oyster, MD [facility] Physical Medicine and Rehabilitation [phone]       Medication List    STOP taking these medications       dextrose 5 % SOLN 50 mL with ceFEPIme 2 G SOLR 2 g     vancomycin 1 GM/200ML Soln  Commonly  known as:  VANCOCIN      TAKE these medications       albuterol (2.5 MG/3ML) 0.083% nebulizer solution  Commonly known as:  PROVENTIL  Take 2.5 mg by nebulization daily as needed for wheezing or shortness of breath.     DULoxetine 30 MG capsule  Commonly known as:  CYMBALTA  Take 30 mg by mouth daily.     fentaNYL 25 MCG/HR patch--Rx 10 patches  Commonly known as:  DURAGESIC - dosed mcg/hr  Place 1 patch (25 mcg total) onto the skin every 3 (three) days.     ferrous sulfate 325 (65 FE) MG tablet  Take 1 tablet (325 mg total) by mouth 3 (three) times daily with meals.     Fluticasone-Salmeterol 250-50 MCG/DOSE Aepb  Commonly known as:  ADVAIR  Inhale 1 puff into the lungs daily.     furosemide 40 MG tablet  Commonly known as:  LASIX  Take 40 mg by mouth daily.     montelukast 10 MG tablet  Commonly known as:  SINGULAIR  Take 10 mg by mouth at bedtime.     multivitamin with minerals Tabs tablet  Take 1 tablet by  mouth daily.     OxyCODONE 15 mg T12a 12 hr tablet-- Rx # 60 pills  Commonly known as:  OXYCONTIN  Take 1 tablet (15 mg total) by mouth every 12 (twelve) hours.     Oxycodone HCl 10 MG Tabs--  Rx # 120 pills.   Take 1 tablet (10 mg total) by mouth every 6 (six) hours as needed for pain.     potassium chloride SA 20 MEQ tablet  Commonly known as:  K-DUR,KLOR-CON  Take 1 tablet (20 mEq total) by mouth 2 (two) times daily.     pravastatin 10 MG tablet  Commonly known as:  PRAVACHOL  Take 10 mg by mouth at bedtime.     topiramate 100 MG tablet  Commonly known as:  TOPAMAX  Take 100 mg by mouth 2 (two) times daily. Also takes 50 mg daily at noon     Vitamin D (Ergocalciferol) 50000 UNITS Caps capsule  Commonly known as:  DRISDOL  Take 50,000 Units by mouth every 7 (seven) days. Sunday     warfarin 5 MG tablet  Commonly known as:  COUMADIN  Take 1.5 tablets (7.5 mg total) by mouth daily.     XOPENEX HFA 45 MCG/ACT inhaler  Generic drug:  levalbuterol   Inhale 2 puffs into the lungs daily as needed for wheezing or shortness of breath.           Follow-up Information   Follow up with Ranelle Oyster, MD On 01/06/2013. (Be there at 9:30 for 10 am appointment)    Specialty:  Physical Medicine and Rehabilitation   Contact information:   [address] [place] [address] [phone]       Follow up with Nadara Mustard, MD. Call today. (for follow up appointment)    Specialty:  Orthopedic Surgery   Contact information:   [address] [place] [address] [phone]       Follow up with Romeo Rabon, MD On 12/30/2012. (@ 10:30 am)    Specialty:  Internal Medicine   Contact information:   [address] [place] [address] [phone]       Follow up with FRAIFELD,EDUARDO. Call today. (for  follow up appointment in 3-4 weeks. )       Signed: Jacquelynn Cree 12/09/2012, 2:25 PM

## 2012-12-09 NOTE — Progress Notes (Signed)
Pt discharged at 1215 with wife to home. Discharge instructions given by Delle Reining, PA with verbal understanding. All belongings with pt.

## 2012-12-09 NOTE — Progress Notes (Signed)
Patient ID: Jeremy [name], male   DOB: Apr 16, 1951, 62 y.o.   MRN: [id number] Subjective/Complaints: No complaints. Tolerated shrinker  Review of Systems  Musculoskeletal: Positive for joint pain.  Neurological: Positive for sensory change.  All other systems reviewed and are negative.   Objective: Vital Signs: Blood pressure 94/55, pulse 62, temperature 98.1 F (36.7 C), temperature source Oral, resp. rate 19, height 5\' 10"  (1.778 m), weight 106.2 kg (234 lb 2.1 oz), SpO2 100.00%. No results found. Results for orders placed during the hospital encounter of 11/27/12 (from the past 72 hour(s))  PROTIME-INR     Status: Abnormal   Collection Time    12/07/12  6:15 AM      Result Value Range   Prothrombin Time 19.7 (*) 11.6 - 15.2 seconds   INR 1.72 (*) 0.00 - 1.49  PROTIME-INR     Status: Abnormal   Collection Time    12/08/12  5:00 AM      Result Value Range   Prothrombin Time 21.2 (*) 11.6 - 15.2 seconds   INR 1.90 (*) 0.00 - 1.49  PROTIME-INR     Status: None   Collection Time    12/09/12  6:20 AM      Result Value Range   Prothrombin Time 12.8  11.6 - 15.2 seconds   INR 0.98  0.00 - 1.49    Physical exam:  General: Alert and oriented x 3, No apparent distress HEENT: Head is normocephalic, atraumatic, PERRLA, EOMI, sclera anicteric, oral mucosa pink and moist, dentition intact, ext ear canals clear,  Neck: Supple without JVD or lymphadenopathy Heart: Reg rate and rhythm. No murmurs rubs or gallops Chest: CTA bilaterally without wheezes, rales, or rhonchi; no distress Abdomen: Soft, non-tender, non-distended, bowel sounds positive. Extremities: No clubbing, cyanosis, or edema. Pulses are 2+ Skin: surgical site healing with mild sero-sanginuous drainage. Tender. Appropriate appearing. Skin still dry Neuro: Pt is cognitively appropriate with normal insight, memory, and awareness. Cranial nerves 2-12 are intact. Distal loss of FT/PP. Reflexes are 1+ in all 4's. Fine motor  coordination is intact. No tremors. Motor function is grossly 5/5 except for amputated leg which is 3+ with flexion, extension Musculoskeletal: intact extension, flexion, pain much improved. Tolerating shrinker Psych: Pt's affect is appropriate. Pt is cooperative       Assessment/Plan: 1. Functional deficits secondary to left BKA which require 3+ hours per day of interdisciplinary therapy in a comprehensive inpatient rehab setting. Physiatrist is providing close team supervision and 24 hour management of active medical problems listed below. Physiatrist and rehab team continue to assess barriers to discharge/monitor patient progress toward functional and medical goals.   Dc home today. i will rx pain meds for one month. Follow up therapy arranged. i would like to see him back in a month for follow up.     FIM: FIM - Bathing Bathing Steps Patient Completed: Chest;Right Arm;Left Arm;Abdomen;Front perineal area;Buttocks;Right upper leg;Left upper leg;Right lower leg (including foot) Bathing: 5: Set-up assist to: Obtain items  FIM - Upper Body Dressing/Undressing Upper body dressing/undressing steps patient completed: Thread/unthread right sleeve of pullover shirt/dresss;Thread/unthread left sleeve of pullover shirt/dress;Put head through opening of pull over shirt/dress;Pull shirt over trunk Upper body dressing/undressing: 7: Complete Independence: No helper FIM - Lower Body Dressing/Undressing Lower body dressing/undressing steps patient completed: Thread/unthread right pants leg;Thread/unthread left pants leg;Pull pants up/down;Don/Doff right sock;Don/Doff right shoe;Fasten/unfasten right shoe Lower body dressing/undressing: 6: Assistive device (Comment)  FIM - Toileting Toileting steps completed by patient: Adjust clothing  prior to toileting;Performs perineal hygiene;Adjust clothing after toileting Toileting Assistive Devices: Grab bar or rail for support Toileting: 6: Assistive  device: No helper  FIM - Diplomatic Services operational officer Devices: Best boy Transfers: 6-Assistive device: No helper  FIM - Banker Devices: Arm rests Bed/Chair Transfer: 6: Supine > Sit: No assist;6: Sit > Supine: No assist;6: Chair or W/C > Bed: No assist;6: Bed > Chair or W/C: No assist  FIM - Locomotion: Wheelchair Distance: 200' Locomotion: Wheelchair: 6: Travels 150 ft or more, turns around, maneuvers to table, bed or toilet, negotiates 3% grade: maneuvers on rugs and over door sills independently FIM - Locomotion: Ambulation Locomotion: Ambulation Assistive Devices: Designer, industrial/product Ambulation/Gait Assistance: 6: Modified independent (Device/Increase time) Locomotion: Ambulation:  (20' Mod I with RW)  Comprehension Comprehension Mode: Auditory Comprehension: 7-Follows complex conversation/direction: With no assist  Expression Expression Mode: Verbal Expression: 7-Expresses complex ideas: With no assist  Social Interaction Social Interaction: 7-Interacts appropriately with others - No medications needed.  Problem Solving Problem Solving: 7-Solves complex problems: Recognizes & self-corrects  Memory Memory: 7-Complete Independence: No helper  Medical Problem List and Plan:  1. DVT Prophylaxis/Anticoagulation: Pharmaceutical: Coumadin  2. Chronic Pain Management: Has spinal cord stimulator. Continue Fentanyl patch, OxyContin, Oxycodone increased to 10mg  for breakthrough pain, Topamax and Cymbalta. Poor pain coping skills in general  -robaxin for muscle spasms  -one month of pain rx's and then transition back to pain clinic 3. Mood: "dependent personality"  4. Neuropsych: This patient is capable of making decisions on his own behalf.  5. ABLA on chronic anemia: Fe supplement 9.6 6. O2 dependent/OSA: Encouraged to wear BIPAP at nights to help with sleep hygiene as well as energy levels. Continue oxygen as  needed. 7. Morbid Obesity: s/p stomach stapling. . Will consult dietician to assist with nutritional needs.  8. DM type 2: diet controlled at present 9. Hypokalemia: Increased supplement to bid and levels back to normal 10. COPD: continue Singulair and Dulera. Nebs, nasal sprays initiated in addition to delsym    -symptoms improved 11. Wound--dry dressing daily, ACE  LOS (Days) 12 A FACE TO FACE EVALUATION WAS PERFORMED  Ardythe Klute T 12/09/2012, 7:52 AM

## 2012-12-09 NOTE — Progress Notes (Signed)
Social Work Patient ID: [name], male   DOB: [birth year], 62 y.o.   MRN: [id number]  [name], LCSW Social Worker Signed  Patient Care Conference Service date: 12/09/2012 9:26 AM  Inpatient RehabilitationTeam Conference and Plan of Care Update Date: 12/08/2012   Time: 2:25 PM     Patient Name: Jeremy [name]       Medical Record Number: [id number]   Date of Birth: 1951-04-22 Sex: Male         Room/Bed: 4M05C/4M05C-01 Payor Info: Payor: MEDICARE / Plan: MEDICARE PART A AND B / Product Type: *No Product type* /   Admitting Diagnosis: LT BKA   Admit Date/Time:  11/27/2012  2:54 PM Admission Comments: No comment available   Primary Diagnosis:  Unilateral complete BKA Principal Problem: Unilateral complete BKA    Patient Active Problem List     Diagnosis  Date Noted   .  Unilateral complete BKA--left  11/30/2012   .  Bacteremia  11/20/2012   .  Cellulitis  11/17/2012   .  DVT (deep venous thrombosis)  11/17/2012   .  PVD (peripheral vascular disease)  11/17/2012   .  COPD (chronic obstructive pulmonary disease)  11/17/2012   .  Psoriasis  11/17/2012   .  Chronic pain syndrome  11/17/2012   .  Chronic anemia  11/17/2012   .  Diabetes mellitus with peripheral vascular disease  11/17/2012   .  Cellulitis of left lower leg  07/27/2011   .  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 Faith Rogue Social Worker Present: [name], LCSW Nurse Present: Kennon Portela, RN PT Present: Karolee Stamps, PT OT Present: Edwin Cap, Loistine Chance, OT Other (Discipline and Name): Ottie Glazier, RN        Current Status/Progress  Stage manager goals. pain better  see prior  see prior   Bowel/Bladder     continent of B/B, calls appropriately  continent  Pt calls appropriatley   Swallow/Nutrition/  Hydration            ADL's     Set-up -> Mod I  Set-up -> Mod I  Goals met, d/c planning.    Mobility     mod I overall w/c level; mod I w/c very short distane gait with RW  mod I overall w/c level; mod I very short distance gait with RW; S car transfer  goals met. Finalize d/c planning.   Communication            Safety/Cognition/ Behavioral Observations           Pain     Pt requests something for pain almost every 2 - 3 hrs. Always in pain. I have been alternating between robaxin, oxy Ir, and tylenol. Pt has scheduled oxy cr  pain control < 5 on a scale of 1 - 10  Pain control   Skin     Pt has psoriasis on his rt knee and left elbow that are healing . Drsg to Left  BKA intact.   healing of Left BKA without infection and less itching for the psoriasis  Medicate as ordered and assess q shift    Rehab Goals Patient on target to meet rehab goals: Yes *See Care Plan and progress  notes for long and short-term goals.    Barriers to Discharge:  none/ addressing      Possible Resolutions to Barriers:    family steroid      Discharge Planning/Teaching Needs:    home with wife who can provide intermittent assistance   incision care/residual limb wrapping    Team Discussion:    Ready for d/c tomorrow.  Plan family ed with PT prior to leaving.   Revisions to Treatment Plan:    None    Continued Need for Acute Rehabilitation Level of Care: The patient requires daily medical management by a physician with specialized training in physical medicine and rehabilitation for the following conditions: Daily direction of a multidisciplinary physical rehabilitation program to ensure safe treatment while eliciting the highest outcome that is of practical value to the patient.: Yes Daily medical management of patient stability for increased activity during participation in an intensive rehabilitation regime.: Yes Daily analysis of laboratory values and/or radiology reports with any subsequent  need for medication adjustment of medical intervention for : Post surgical problems;Neurological problems  Jeremy [name] 12/09/2012, 9:26 AM

## 2012-12-09 NOTE — Progress Notes (Signed)
Social Work  Discharge Note  The overall goal for the admission was met for:   Discharge location: Yes - home with wife to provide intermittent assistance  Length of Stay: Yes - 12 days  Discharge activity level: Yes - modified independent  Home/community participation: Yes  Services provided included: MD, RD, PT, OT, RN, TR, Pharmacy and SW  Financial Services: Medicare and Private Insurance: BCBS  Follow-up services arranged: Home Health: [facility], PT via [facility], DME: 20x18 Breezy w/c with amputee support pad, cushion, wide rolling walker, 3n1 commode and tub bench via [facility] and Patient/Family has no preference for HH/DME agencies  Comments (or additional information):  Patient/Family verbalized understanding of follow-up arrangements: Yes  Individual responsible for coordination of the follow-up plan: patient  Confirmed correct DME delivered: Jeremy [name] 12/09/2012    Alejandra Hunt

## 2012-12-09 NOTE — Patient Care Conference (Signed)
Inpatient RehabilitationTeam Conference and Plan of Care Update Date: 12/08/2012   Time: 2:25 PM    Patient Name: Jeremy [name]      Medical Record Number: [id number]  Date of Birth: [birth year] Sex: Male         Room/Bed: 4M05C/4M05C-01 Payor Info: Payor: MEDICARE / Plan: MEDICARE PART A AND B / Product Type: *No Product type* /    Admitting Diagnosis: LT BKA  Admit Date/Time:  11/27/2012  2:54 PM Admission Comments: No comment available   Primary Diagnosis:  Unilateral complete BKA Principal Problem: Unilateral complete BKA  Patient Active Problem List   Diagnosis Date Noted  . Unilateral complete BKA--left 11/30/2012  . Bacteremia 11/20/2012  . Cellulitis 11/17/2012  . DVT (deep venous thrombosis) 11/17/2012  . PVD (peripheral vascular disease) 11/17/2012  . COPD (chronic obstructive pulmonary disease) 11/17/2012  . Psoriasis 11/17/2012  . Chronic pain syndrome 11/17/2012  . Chronic anemia 11/17/2012  . Diabetes mellitus with peripheral vascular disease 11/17/2012  . Cellulitis of left lower leg 07/27/2011  . 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 Faith [name] Social Worker Present: Jeremy Jupiter, LCSW Nurse Present: Jeremy Portela, RN PT Present: Jeremy [name], PT OT Present: Jeremy [name], Jeremy [name], OT Other (Discipline and Name): Jeremy Glazier, RN     Current Status/Progress Programmer, multimedia goals. pain better  see prior  see prior   Bowel/Bladder   continent of B/B, calls appropriately  continent  Pt calls appropriatley   Swallow/Nutrition/ Hydration             ADL's   Set-up -> Mod I  Set-up -> Mod I  Goals met, d/c planning.    Mobility   mod I overall w/c level; mod I w/c very short distane gait with RW  mod I overall w/c level; mod I very short distance gait with RW;  S car transfer  goals met. Finalize d/c planning.   Communication             Safety/Cognition/ Behavioral Observations            Pain   Pt requests something for pain almost every 2 - 3 hrs. Always in pain. I have been alternating between robaxin, oxy Ir, and tylenol. Pt has scheduled oxy cr  pain control < 5 on a scale of 1 - 10  Pain control   Skin   Pt has psoriasis on his rt knee and left elbow that are healing . Drsg to Left  BKA intact.   healing of Left BKA without infection and less itching for the psoriasis  Medicate as ordered and assess q shift    Rehab Goals Patient on target to meet rehab goals: Yes *See Care Plan and progress notes for long and short-term goals.  Barriers to Discharge: none/ addressing    Possible Resolutions to Barriers:  family steroid    Discharge Planning/Teaching Needs:  home with wife who can provide intermittent assistance  incision care/residual limb wrapping   Team Discussion:  Ready for d/c tomorrow.  Plan family ed with PT prior to leaving.  Revisions to Treatment Plan:  None   Continued Need for Acute Rehabilitation Level of Care: The patient requires daily medical management by a physician with specialized training in physical medicine and rehabilitation for the following conditions:  Daily direction of a multidisciplinary physical rehabilitation program to ensure safe treatment while eliciting the highest outcome that is of practical value to the patient.: Yes Daily medical management of patient stability for increased activity during participation in an intensive rehabilitation regime.: Yes Daily analysis of laboratory values and/or radiology reports with any subsequent need for medication adjustment of medical intervention for : Post surgical problems;Neurological problems  Jeremy [name] 12/09/2012, 9:26 AM

## 2013-01-06 ENCOUNTER — Encounter: Payer: Self-pay | Admitting: Physical Medicine & Rehabilitation

## 2013-01-06 ENCOUNTER — Encounter: Payer: Medicare Other | Attending: Physical Medicine & Rehabilitation | Admitting: Physical Medicine & Rehabilitation

## 2013-01-06 VITALS — BP 101/54 | HR 63 | Resp 16 | Ht 70.0 in | Wt 254.0 lb

## 2013-01-06 DIAGNOSIS — J449 Chronic obstructive pulmonary disease, unspecified: Secondary | ICD-10-CM

## 2013-01-06 DIAGNOSIS — M79609 Pain in unspecified limb: Secondary | ICD-10-CM | POA: Insufficient documentation

## 2013-01-06 DIAGNOSIS — J4489 Other specified chronic obstructive pulmonary disease: Secondary | ICD-10-CM | POA: Insufficient documentation

## 2013-01-06 DIAGNOSIS — E1151 Type 2 diabetes mellitus with diabetic peripheral angiopathy without gangrene: Secondary | ICD-10-CM

## 2013-01-06 DIAGNOSIS — S88119A Complete traumatic amputation at level between knee and ankle, unspecified lower leg, initial encounter: Secondary | ICD-10-CM | POA: Insufficient documentation

## 2013-01-06 DIAGNOSIS — G894 Chronic pain syndrome: Secondary | ICD-10-CM | POA: Insufficient documentation

## 2013-01-06 DIAGNOSIS — E119 Type 2 diabetes mellitus without complications: Secondary | ICD-10-CM | POA: Insufficient documentation

## 2013-01-06 DIAGNOSIS — IMO0002 Reserved for concepts with insufficient information to code with codable children: Secondary | ICD-10-CM

## 2013-01-06 DIAGNOSIS — K219 Gastro-esophageal reflux disease without esophagitis: Secondary | ICD-10-CM | POA: Insufficient documentation

## 2013-01-06 DIAGNOSIS — S88112S Complete traumatic amputation at level between knee and ankle, left lower leg, sequela: Secondary | ICD-10-CM

## 2013-01-06 DIAGNOSIS — E1159 Type 2 diabetes mellitus with other circulatory complications: Secondary | ICD-10-CM

## 2013-01-06 DIAGNOSIS — Z86718 Personal history of other venous thrombosis and embolism: Secondary | ICD-10-CM | POA: Insufficient documentation

## 2013-01-06 DIAGNOSIS — G4730 Sleep apnea, unspecified: Secondary | ICD-10-CM | POA: Insufficient documentation

## 2013-01-06 DIAGNOSIS — I739 Peripheral vascular disease, unspecified: Secondary | ICD-10-CM | POA: Insufficient documentation

## 2013-01-06 DIAGNOSIS — I798 Other disorders of arteries, arterioles and capillaries in diseases classified elsewhere: Secondary | ICD-10-CM

## 2013-01-06 MED ORDER — FENTANYL 25 MCG/HR TD PT72: 1.0000 | MEDICATED_PATCH | TRANSDERMAL | Status: DC

## 2013-01-06 MED ORDER — OXYCODONE HCL 10 MG PO TABS: 10.0000 mg | ORAL_TABLET | Freq: Four times a day (QID) | ORAL | Status: DC | PRN

## 2013-01-06 MED ORDER — OXYCODONE HCL ER 15 MG PO T12A: 15.0000 mg | EXTENDED_RELEASE_TABLET | Freq: Two times a day (BID) | ORAL | Status: DC

## 2013-01-06 NOTE — Patient Instructions (Signed)
TARGET 3-4 OXYCODONES PER DAY FOR YOUR BREAKTHROUGH PAIN.

## 2013-01-06 NOTE — Progress Notes (Signed)
Subjective:    Patient ID: Jeremy [name], male    DOB: Sep 06, 1950, 62 y.o.   MRN: [id number]  HPI  Mr. Geiman is back regarding his left BKA. His wound is almost closed. The pain is decreased overall.   From a mobility standpoint he's been using a walker for short dx. He is able to transfer by himself.   He is seeing [facility] for his prosthesis.   He remains with a pain doctor in [place] who rx'es his oxycodone.   Pain Inventory Average Pain 2 Pain Right Now 0 My pain is sharp, stabbing and aching  In the last 24 hours, has pain interfered with the following? General activity n/a Relation with others n/a Enjoyment of life n/a What TIME of day is your pain at its worst? morning Sleep (in general) Poor  Pain is worse with: inactivity Pain improves with: medication Relief from Meds: 10  Mobility walk without assistance use a walker ability to climb steps?  no do you drive?  no use a wheelchair transfers alone Do you have any goals in this area?  yes  Function retired I need assistance with the following:  meal prep and shopping  Neuro/Psych No problems in this area  Prior Studies Any changes since last visit?  no  Physicians involved in your care Any changes since last visit?  no   Family History  Problem Relation Age of Onset  . Heart disease Brother    History   Social History  . Marital Status: Married    Spouse Name: N/A    Number of Children: N/A  . Years of Education: N/A   Social History Main Topics  . Smoking status: Former Smoker -- 0.50 packs/day for 6 years    Types: Cigarettes    Quit date: 12/03/1990  . Smokeless tobacco: Never Used  . Alcohol Use: 0.0 oz/week    0 Cans of beer per week     Comment: rare  . Drug Use: Yes  . Sexual Activity: Not Currently   Other Topics Concern  . None   Social History Narrative  . None   Past Surgical History  Procedure Laterality Date  . Arm surgery    . Vein ligation and  stripping    . Tummy tuck    . Stomach stapling    . Skin grafts    . I&d extremity  03/13/2011    Procedure: IRRIGATION AND DEBRIDEMENT EXTREMITY;  Surgeon: Nadara Mustard, MD;  Location: [facility] OR;  Service: Orthopedics;  Laterality: Left;  . Skin split graft  03/13/2012    Procedure: SKIN GRAFT SPLIT THICKNESS;  Surgeon: Nadara Mustard, MD;  Location: [facility] OR;  Service: Orthopedics;  Laterality: Left;  Excisional Debridement Left Leg, Split Thickness Skin Graft, Wound VAC  . Back stimulator    . Apligraft placement Left 07/31/2012    Procedure: Apply Skin Graft;  Surgeon: Nadara Mustard, MD;  Location[facility] OR;  Service: Orthopedics;  Laterality: Left;  . Application of wound vac Left 07/31/2012    Procedure: APPLICATION OF WOUND VAC;  Surgeon: Nadara Mustard, MD;  Location: [facility] OR;  Service: Orthopedics;  Laterality: Left;  . Cardiac catheterization      Hx: of " around 1985 at [facility]"  . Cyst excision      Hx: of  . I&d extremity Left 11/06/2012    Procedure: IRRIGATION AND DEBRIDEMENT EXTREMITY-Left lower ;  Surgeon: Nadara Mustard, MD;  Location: [facility] OR;  Service: Orthopedics;  Laterality: Left;  Left Leg Irrigation and Debridement, Place Skin Graft, Wound VAC, [place]  . Amputation Left 11/25/2012    Procedure: AMPUTATION BELOW KNEE;  Surgeon: Nadara Mustard, MD;  Location: [facility] OR;  Service: Orthopedics;  Laterality: Left;  Left Below Knee Amputation   Past Medical History  Diagnosis Date  . GERD (gastroesophageal reflux disease)   . Asthma   . Shortness of breath   . Leg pain   . Peripheral vascular disease   . Sleep apnea     uses bipap, sleep study done in [place] 2 years ago, dr Cala Bradford byrd  . Anemia   . Headache(784.0)     sinus headaches  . COPD (chronic obstructive pulmonary disease)     3L continuous  . Peripheral neuropathy   . Cancer     hx basal cell skin cancer  . Psoriasis   . 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 CHF (congestive heart failure)     does not see  a cardiologist  . Diabetes mellitus     not on medication   BP 101/54  Pulse 63  Resp 16  Ht 5\' 10"  (1.778 m)  Wt 254 lb (115.214 kg)  BMI 36.45 kg/m2  SpO2 97%     Review of Systems  All other systems reviewed and are negative.       Objective:   Physical Exam  General: Alert and oriented x 3, No apparent distress HEENT: Head is normocephalic, atraumatic, PERRLA, EOMI, sclera anicteric, oral mucosa pink and moist, dentition intact, ext ear canals clear,  Neck: Supple without JVD or lymphadenopathy Heart: Reg rate and rhythm. No murmurs rubs or gallops Chest: CTA bilaterally without wheezes, rales, or rhonchi; no distress Abdomen: Soft, non-tender, non-distended, bowel sounds positive. Extremities: No clubbing, cyanosis, or edema. Pulses are 2+ Skin: Clean and intact with minimal breakdown at the surgical incision.  Neuro: Pt is cognitively appropriate with normal insight, memory, and awareness. Cranial nerves 2-12 are intact. Sensory exam is normal except for the distal LLE.  Reflexes are 2+ in all 4's. Fine motor coordination is intact. No tremors. Motor function is grossly 5/5 except for the left leg which is 4 to 4+/5  Musculoskeletal: his left residual limb is well formed and minimally tender.  Psych: Pt's affect is appropriate. Pt is cooperative         Assessment & Plan:  1. Left BKA  2. Chronic pain syndrome related to his chronic left leg pain 3. Hx of spinal stimulator 4. COPD   Plan: 1. Will continue with oxycontin 15mg  q12 and fentanyl patch for pain control with idea of decreasing his fentanyl to off over the next few months.  2. Refilled oxycodone for breakthrough pain #105 3. Wound healing nicely. Continue stump shrinker. Fitting per Education officer, museum. He should be a K2-3 ambulator 4. Follow up with me in one month. 30 minutes of face to face patient care time were spent during this visit. All questions were encouraged and answered.

## 2013-02-01 ENCOUNTER — Encounter: Payer: Medicare Other | Attending: Physical Medicine & Rehabilitation | Admitting: Physical Medicine & Rehabilitation

## 2013-02-01 ENCOUNTER — Encounter: Payer: Self-pay | Admitting: Physical Medicine & Rehabilitation

## 2013-02-01 VITALS — BP 119/52 | HR 59 | Resp 16 | Ht 70.0 in | Wt 260.0 lb

## 2013-02-01 DIAGNOSIS — J449 Chronic obstructive pulmonary disease, unspecified: Secondary | ICD-10-CM | POA: Insufficient documentation

## 2013-02-01 DIAGNOSIS — Z5181 Encounter for therapeutic drug level monitoring: Secondary | ICD-10-CM

## 2013-02-01 DIAGNOSIS — S88119A Complete traumatic amputation at level between knee and ankle, unspecified lower leg, initial encounter: Secondary | ICD-10-CM | POA: Insufficient documentation

## 2013-02-01 DIAGNOSIS — G894 Chronic pain syndrome: Secondary | ICD-10-CM

## 2013-02-01 DIAGNOSIS — G8929 Other chronic pain: Secondary | ICD-10-CM | POA: Insufficient documentation

## 2013-02-01 DIAGNOSIS — IMO0002 Reserved for concepts with insufficient information to code with codable children: Secondary | ICD-10-CM

## 2013-02-01 DIAGNOSIS — Z79899 Other long term (current) drug therapy: Secondary | ICD-10-CM

## 2013-02-01 DIAGNOSIS — M79609 Pain in unspecified limb: Secondary | ICD-10-CM | POA: Insufficient documentation

## 2013-02-01 DIAGNOSIS — S88112S Complete traumatic amputation at level between knee and ankle, left lower leg, sequela: Secondary | ICD-10-CM

## 2013-02-01 DIAGNOSIS — J4489 Other specified chronic obstructive pulmonary disease: Secondary | ICD-10-CM | POA: Insufficient documentation

## 2013-02-01 MED ORDER — FENTANYL 12 MCG/HR TD PT72: 1.0000 | MEDICATED_PATCH | TRANSDERMAL | Status: DC

## 2013-02-01 MED ORDER — OXYCODONE HCL ER 15 MG PO T12A: 15.0000 mg | EXTENDED_RELEASE_TABLET | Freq: Two times a day (BID) | ORAL | Status: DC

## 2013-02-01 MED ORDER — OXYCODONE HCL 10 MG PO TABS: 10.0000 mg | ORAL_TABLET | Freq: Four times a day (QID) | ORAL | Status: DC | PRN

## 2013-02-01 NOTE — Patient Instructions (Signed)
STOP YOUR FENTANYL PATCH AFTER TWO WEEKS.  IF YOUR PAIN INCREASES AFTER STOPPING, YOU MAY USE THE SECOND BOX TO WEAN OFF.---IE TAKE ONE PATCH FOR 4 DAYS, THE NEXT FOR 5 DAYS, ETC.

## 2013-02-01 NOTE — Progress Notes (Signed)
Subjective:    Patient ID: Jeremy [name], male    DOB: August 20, 1950, 62 y.o.   MRN: [id number]  HPI  Jeremy [name] is back regarding his left BKA. He was just casted for his prosthesis this morning.  His pain is improving. Every once and a while he will have a sharp shooting pain but it's much less frequent. The sensitivity is improving in the limb. His pain is the worst when he wakes up in the morning along his back and the rest of his body. Once he starts moving around, it tends to ease off. He takes his medications as scheduled.  Pain Inventory Average Pain 1 Pain Right Now 0 My pain is sharp, stabbing and aching  In the last 24 hours, has pain interfered with the following? General activity 0 Relation with others 0 Enjoyment of life 0 What TIME of day is your pain at its worst? morning Sleep (in general) Fair  Pain is worse with: walking, standing and some activites Pain improves with: medication Relief from Meds: 10  Mobility use a walker ability to climb steps?  no do you drive?  yes use a wheelchair transfers alone  Function disabled: date disabled .  Neuro/Psych No problems in this area  Prior Studies Any changes since last visit?  no  Physicians involved in your care Any changes since last visit?  no   Family History  Problem Relation Age of Onset  . Heart disease Brother    History   Social History  . Marital Status: Married    Spouse Name: N/A    Number of Children: N/A  . Years of Education: N/A   Social History Main Topics  . Smoking status: Former Smoker -- 0.50 packs/day for 6 years    Types: Cigarettes    Quit date: 12/03/1990  . Smokeless tobacco: Never Used  . Alcohol Use: 0.0 oz/week    0 Cans of beer per week     Comment: rare  . Drug Use: Yes  . Sexual Activity: Not Currently   Other Topics Concern  . None   Social History Narrative  . None   Past Surgical History  Procedure Laterality Date  . Arm surgery    . Vein ligation and  stripping    . Tummy tuck    . Stomach stapling    . Skin grafts    . I&d extremity  03/13/2011    Procedure: IRRIGATION AND DEBRIDEMENT EXTREMITY;  Surgeon: Nadara Mustard, MD;  Location: [facility] OR;  Service: Orthopedics;  Laterality: Left;  . Skin split graft  03/13/2012    Procedure: SKIN GRAFT SPLIT THICKNESS;  Surgeon: Nadara Mustard, MD;  Location: [facility] OR;  Service: Orthopedics;  Laterality: Left;  Excisional Debridement Left Leg, Split Thickness Skin Graft, Wound VAC  . Back stimulator    . Apligraft placement Left 07/31/2012    Procedure: Apply Skin Graft;  Surgeon: Nadara Mustard, MD;  Location: [facility] OR;  Service: Orthopedics;  Laterality: Left;  . Application of wound vac Left 07/31/2012    Procedure: APPLICATION OF WOUND VAC;  Surgeon: Nadara Mustard, MD;  Location: [facility] OR;  Service: Orthopedics;  Laterality: Left;  . Cardiac catheterization      Hx: of " around 1985 at [facility]"  . Cyst excision      Hx: of  . I&d extremity Left 11/06/2012    Procedure: IRRIGATION AND DEBRIDEMENT EXTREMITY-Left lower ;  Surgeon: Nadara Mustard, MD;  Location: [facility]  OR;  Service: Orthopedics;  Laterality: Left;  Left Leg Irrigation and Debridement, Place Skin Graft, Wound VAC, [place]  . Amputation Left 11/25/2012    Procedure: AMPUTATION BELOW KNEE;  Surgeon: Nadara Mustard, MD;  Location: [facility] OR;  Service: Orthopedics;  Laterality: Left;  Left Below Knee Amputation   Past Medical History  Diagnosis Date  . GERD (gastroesophageal reflux disease)   . Asthma   . Shortness of breath   . Leg pain   . Peripheral vascular disease   . Sleep apnea     uses bipap, sleep study done in [place] 2 years ago, dr Cala Bradford byrd  . Anemia   . Headache(784.0)     sinus headaches  . COPD (chronic obstructive pulmonary disease)     3L continuous  . Peripheral neuropathy   . Cancer     hx basal cell skin cancer  . Psoriasis   . 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 CHF (congestive heart failure)     does not see  a cardiologist  . Diabetes mellitus     not on medication   BP 119/52  Pulse 59  Resp 16  Ht 5\' 10"  (1.778 m)  Wt 260 lb (117.935 kg)  BMI 37.31 kg/m2  SpO2 9%   Review of Systems  Musculoskeletal: Positive for gait problem.  All other systems reviewed and are negative.       Objective:   Physical Exam General: Alert and oriented x 3, No apparent distress  HEENT: Head is normocephalic, atraumatic, PERRLA, EOMI, sclera anicteric, oral mucosa pink and moist, dentition intact, ext ear canals clear,  Neck: Supple without JVD or lymphadenopathy  Heart: Reg rate and rhythm. No murmurs rubs or gallops  Chest: CTA bilaterally without wheezes, rales, or rhonchi; no distress  Abdomen: Soft, non-tender, non-distended, bowel sounds positive.  Extremities: No clubbing, cyanosis, or edema. Pulses are 2+  Skin: wound healed.   Neuro: Pt is cognitively appropriate with normal insight, memory, and awareness. Cranial nerves 2-12 are intact. Sensory exam is normal except for the distal LLE. Reflexes are 2+ in all 4's. Fine motor coordination is intact. No tremors. Motor function is grossly 5/5 except for the left leg which is 4 to 4+/5  Musculoskeletal: his left residual limb is well formed and minimally tender. Distal fibula is prominent. Psych: Pt's affect is appropriate. Pt is cooperative   Assessment & Plan:   1. Left BKA  2. Chronic pain syndrome related to his chronic left leg pain  3. Hx of spinal stimulator  4. COPD    Plan:  1. Will continue with oxycontin 15mg  q12. Wean fentanyl to and continue for two weeks and stop. I gave him an additional box in case he needs to taper this a bit.   2. Refilled oxycodone for breakthrough pain #105  3. Wound healed.   Casting/fitting per [facility]. He should be a  3 ambulator  4. Follow up with me in one month. 30 minutes of face to face patient care time were spent during this visit. All questions were encouraged and answered.

## 2013-03-02 ENCOUNTER — Encounter: Payer: Self-pay | Admitting: Physical Medicine & Rehabilitation

## 2013-03-02 ENCOUNTER — Encounter: Payer: Medicare Other | Attending: Physical Medicine & Rehabilitation | Admitting: Physical Medicine & Rehabilitation

## 2013-03-02 VITALS — BP 125/54 | HR 87 | Resp 14 | Ht 70.0 in | Wt 260.0 lb

## 2013-03-02 DIAGNOSIS — I798 Other disorders of arteries, arterioles and capillaries in diseases classified elsewhere: Secondary | ICD-10-CM

## 2013-03-02 DIAGNOSIS — Z79899 Other long term (current) drug therapy: Secondary | ICD-10-CM

## 2013-03-02 DIAGNOSIS — E1159 Type 2 diabetes mellitus with other circulatory complications: Secondary | ICD-10-CM

## 2013-03-02 DIAGNOSIS — J449 Chronic obstructive pulmonary disease, unspecified: Secondary | ICD-10-CM

## 2013-03-02 DIAGNOSIS — S88112S Complete traumatic amputation at level between knee and ankle, left lower leg, sequela: Secondary | ICD-10-CM

## 2013-03-02 DIAGNOSIS — M79609 Pain in unspecified limb: Secondary | ICD-10-CM | POA: Insufficient documentation

## 2013-03-02 DIAGNOSIS — G894 Chronic pain syndrome: Secondary | ICD-10-CM | POA: Insufficient documentation

## 2013-03-02 DIAGNOSIS — S88119A Complete traumatic amputation at level between knee and ankle, unspecified lower leg, initial encounter: Secondary | ICD-10-CM | POA: Insufficient documentation

## 2013-03-02 DIAGNOSIS — IMO0002 Reserved for concepts with insufficient information to code with codable children: Secondary | ICD-10-CM

## 2013-03-02 DIAGNOSIS — E1151 Type 2 diabetes mellitus with diabetic peripheral angiopathy without gangrene: Secondary | ICD-10-CM

## 2013-03-02 DIAGNOSIS — Z5181 Encounter for therapeutic drug level monitoring: Secondary | ICD-10-CM

## 2013-03-02 DIAGNOSIS — J4489 Other specified chronic obstructive pulmonary disease: Secondary | ICD-10-CM | POA: Insufficient documentation

## 2013-03-02 MED ORDER — OXYCODONE HCL 10 MG PO TABS: 10.0000 mg | ORAL_TABLET | Freq: Four times a day (QID) | ORAL | Status: DC | PRN

## 2013-03-02 MED ORDER — FENTANYL 12 MCG/HR TD PT72: 12.5000 ug | MEDICATED_PATCH | TRANSDERMAL | Status: DC

## 2013-03-02 MED ORDER — OXYCODONE HCL ER 15 MG PO T12A: 15.0000 mg | EXTENDED_RELEASE_TABLET | Freq: Two times a day (BID) | ORAL | Status: DC

## 2013-03-02 NOTE — Patient Instructions (Signed)
CALL ME WITH ANY PROBLEMS OR QUESTIONS (#[phone]).  HAVE A GOOD DAY  

## 2013-03-02 NOTE — Progress Notes (Signed)
Subjective:    Patient ID: Jeremy [name], male    DOB: [birth year], 61 y.o.   MRN: [id number]  HPI  Jeremy [name] is back regarding his chronic pain,left BKA. He has weaned down to the but hasn't been able to come off completely. He has been wearing his current one for 4 days. He has 4 patches left. For the most part his pain is controlled. He is still having an occasional shooting pain along the incision site.   Tomorrow he's to be fitted with his right BK prosthesis for permanent use. He will pursue PT in [place] for gait training. He is "itching" to get started.   Medically, he has been otherwise stable.   Pain Inventory Average Pain 3 Pain Right Now 0 My pain is sharp, stabbing and aching  In the last 24 hours, has pain interfered with the following? General activity 0 Relation with others 0 Enjoyment of life 0 What TIME of day is your pain at its worst? morning Sleep (in general) Fair  Pain is worse with: unsure Pain improves with: medication and other Relief from Meds: 10  Mobility use a walker do you drive?  yes use a wheelchair transfers alone Do you have any goals in this area?  yes  Function retired Do you have any goals in this area?  no  Neuro/Psych anxiety  Prior Studies Any changes since last visit?  no  Physicians involved in your care Any changes since last visit?  no   Family History  Problem Relation Age of Onset  . Heart disease Brother    History   Social History  . Marital Status: Married    Spouse Name: N/A    Number of Children: N/A  . Years of Education: N/A   Social History Main Topics  . Smoking status: Former Smoker -- 0.50 packs/day for 6 years    Types: Cigarettes    Quit date: 12/03/1990  . Smokeless tobacco: Never Used  . Alcohol Use: 0.0 oz/week    0 Cans of beer per week     Comment: rare  . Drug Use: Yes  . Sexual Activity: Not Currently   Other Topics Concern  . None   Social History Narrative  . None    Past Surgical History  Procedure Laterality Date  . Arm surgery    . Vein ligation and stripping    . Tummy tuck    . Stomach stapling    . Skin grafts    . I&d extremity  03/13/2011    Procedure: IRRIGATION AND DEBRIDEMENT EXTREMITY;  Surgeon: Nadara Mustard, MD;  Location: [facility] OR;  Service: Orthopedics;  Laterality: Left;  . Skin split graft  03/13/2012    Procedure: SKIN GRAFT SPLIT THICKNESS;  Surgeon: Nadara Mustard, MD;  Location: [facility] OR;  Service: Orthopedics;  Laterality: Left;  Excisional Debridement Left Leg, Split Thickness Skin Graft, Wound VAC  . Back stimulator    . Apligraft placement Left 07/31/2012    Procedure: Apply Skin Graft;  Surgeon: Nadara Mustard, MD;  Location: [facility] OR;  Service: Orthopedics;  Laterality: Left;  . Application of wound vac Left 07/31/2012    Procedure: APPLICATION OF WOUND VAC;  Surgeon: Nadara Mustard, MD;  Location: [facility] OR;  Service: Orthopedics;  Laterality: Left;  . Cardiac catheterization      Hx: of " around 1985 at [facility]"  . Cyst excision      Hx: of  . I&d extremity Left  11/06/2012    Procedure: IRRIGATION AND DEBRIDEMENT EXTREMITY-Left lower ;  Surgeon: Nadara Mustard, MD;  Location: [facility] OR;  Service: Orthopedics;  Laterality: Left;  Left Leg Irrigation and Debridement, Place Skin Graft, Wound VAC, [place]  . Amputation Left 11/25/2012    Procedure: AMPUTATION BELOW KNEE;  Surgeon: Nadara Mustard, MD;  Location: [facility] OR;  Service: Orthopedics;  Laterality: Left;  Left Below Knee Amputation   Past Medical History  Diagnosis Date  . GERD (gastroesophageal reflux disease)   . Asthma   . Shortness of breath   . Leg pain   . Peripheral vascular disease   . Sleep apnea     uses bipap, sleep study done in [place] 2 years ago, dr Cala Bradford byrd  . Anemia   . Headache(784.0)     sinus headaches  . COPD (chronic obstructive pulmonary disease)     3L continuous  . Peripheral neuropathy   . Cancer     hx basal cell skin cancer  . Psoriasis   . DVT  (deep venous thrombosis)     LLE DVT '80's  . CHF (congestive heart failure)     does not see a cardiologist  . Diabetes mellitus     not on medication   BP 125/54  Pulse 87  Resp 14  Ht 5\' 10"  (1.778 m)  Wt 260 lb (117.935 kg)  BMI 37.31 kg/m2  SpO2 97%     Review of Systems  Respiratory: Positive for apnea, cough, shortness of breath and wheezing.   Psychiatric/Behavioral: The patient is nervous/anxious.        Objective:   Physical Exam  General: Alert and oriented x 3, No apparent distress  HEENT: Head is normocephalic, atraumatic, PERRLA, EOMI, sclera anicteric, oral mucosa pink and moist, dentition intact, ext ear canals clear,  Neck: Supple without JVD or lymphadenopathy  Heart: Reg rate and rhythm. No murmurs rubs or gallops  Chest: CTA bilaterally without wheezes, rales, or rhonchi; no distress  Abdomen: Soft, non-tender, non-distended, bowel sounds positive.  Extremities: No clubbing, cyanosis, or edema. Pulses are 2+  Skin: wound healed with scar, some dry skin.  Neuro: Pt is cognitively appropriate with normal insight, memory, and awareness. Cranial nerves 2-12 are intact. Sensory exam is normal except for the distal LLE. Reflexes are 2+ in all 4's. Fine motor coordination is intact. No tremors. Motor function is grossly 5/5 except for the left leg which is 4 to 4+/5  Musculoskeletal: his left residual limb is well formed. Distal fibula is prominent.  Psych: Pt's affect is appropriate. Pt is cooperative    Assessment & Plan:   1. Left BKA  2. Chronic pain syndrome related to his chronic left leg pain  3. Hx of spinal stimulator  4. COPD   Plan:  1. Will continue with oxycontin 15mg  q12 with second rx for nex month. I gave him one more box of the fentanyl patches which should allow him to completely come off of these over the next month. 2. Refilled oxycodone for breakthrough pain #105 . Second rx was given.  We discussed the fact that the wearing of his  prosthesis should help much of his stump pain, assuming that it's fiting correctly.  3. Wound healed. Fitting per [facility] rx was given for gait training. He will pursue this in [place], [place]. 4. Follow up with me in one month. 30 minutes of face to face patient care time were spent during this visit. All questions were  encouraged and answered.

## 2013-03-25 ENCOUNTER — Telehealth: Payer: Self-pay

## 2013-03-25 NOTE — Telephone Encounter (Signed)
Patient called requesting we prescribe his topiramate 100mg  bid.  He uses [facility] in [place].  Please advise.

## 2013-03-26 MED ORDER — TOPIRAMATE 100 MG PO TABS: 100.0000 mg | ORAL_TABLET | Freq: Two times a day (BID) | ORAL | Status: DC

## 2013-03-26 NOTE — Telephone Encounter (Signed)
Patient takes the topiramate one in the morning and one at night.  Refill sent to pharmacy.  Patient aware.

## 2013-03-26 NOTE — Telephone Encounter (Signed)
Please confirm the sig---it says he takes 100bid and an addnl 50mg  at noon? Is this correct?  If so the # of pills needs to reflect this-- otherwise i'm ok with this--can have 4 addnl rf

## 2013-04-05 ENCOUNTER — Telehealth: Payer: Self-pay

## 2013-04-05 NOTE — Telephone Encounter (Signed)
Patient called and wanted to let us know that he had fallen and went to the ER.  He was given a script for oxy 5.  Advised patient he should not take medication and should continue his medication from Dr Riley Kill as directed.  Offered earlier appointment but patient has script for next month.  He will schedule appointment to follow up.

## 2013-04-05 NOTE — Telephone Encounter (Signed)
ok 

## 2013-05-04 ENCOUNTER — Encounter: Payer: Medicare Other | Attending: Physical Medicine & Rehabilitation | Admitting: Physical Medicine & Rehabilitation

## 2013-05-04 ENCOUNTER — Encounter: Payer: Self-pay | Admitting: Physical Medicine & Rehabilitation

## 2013-05-04 VITALS — BP 160/55 | HR 75 | Resp 16

## 2013-05-04 DIAGNOSIS — E1159 Type 2 diabetes mellitus with other circulatory complications: Secondary | ICD-10-CM

## 2013-05-04 DIAGNOSIS — Z79899 Other long term (current) drug therapy: Secondary | ICD-10-CM

## 2013-05-04 DIAGNOSIS — E1151 Type 2 diabetes mellitus with diabetic peripheral angiopathy without gangrene: Secondary | ICD-10-CM

## 2013-05-04 DIAGNOSIS — I798 Other disorders of arteries, arterioles and capillaries in diseases classified elsewhere: Secondary | ICD-10-CM | POA: Insufficient documentation

## 2013-05-04 DIAGNOSIS — G894 Chronic pain syndrome: Secondary | ICD-10-CM

## 2013-05-04 DIAGNOSIS — S88119A Complete traumatic amputation at level between knee and ankle, unspecified lower leg, initial encounter: Secondary | ICD-10-CM

## 2013-05-04 DIAGNOSIS — Z5181 Encounter for therapeutic drug level monitoring: Secondary | ICD-10-CM

## 2013-05-04 DIAGNOSIS — IMO0002 Reserved for concepts with insufficient information to code with codable children: Secondary | ICD-10-CM | POA: Insufficient documentation

## 2013-05-04 DIAGNOSIS — S88112S Complete traumatic amputation at level between knee and ankle, left lower leg, sequela: Secondary | ICD-10-CM

## 2013-05-04 MED ORDER — OXYCODONE HCL ER 15 MG PO T12A: 15.0000 mg | EXTENDED_RELEASE_TABLET | Freq: Two times a day (BID) | ORAL | Status: DC

## 2013-05-04 MED ORDER — OXYCODONE HCL 5 MG PO TABS: 5.0000 mg | ORAL_TABLET | Freq: Four times a day (QID) | ORAL | Status: DC | PRN

## 2013-05-04 NOTE — Patient Instructions (Signed)
CONTINUE TO TAKE YOUR TIME AND WORK ON SAFETY AWARENESS.

## 2013-05-04 NOTE — Progress Notes (Signed)
Subjective:    Patient ID: Jeremy [name], male    DOB: 23-May-1950, 63 y.o.   MRN: [id number]  HPI  Mr. Nawabi is back regarding his chronic pain. He fell in December and landed on the left BK. He was given some oxycodones in the ED which he had filled because he was afraid that he would run out of his 10mg  doses. He found that 5mg  dose was often as effective as the 10mg  dosing.   He developed a sore on his stump and had to back off therapies a bit. He resumes this week. He has been working with the prosthetist on adjustments  Pain Inventory Average Pain 4 Pain Right Now 1 My pain is intermittent, sharp, stabbing and aching  In the last 24 hours, has pain interfered with the following?d General activity 3 Relation with others 3 Enjoyment of life 3 What TIME of day is your pain at its worst? morning Sleep (in general) Poor  Pain is worse with: inactivity Pain improves with: medication Relief from Meds: 10  Mobility use a walker ability to climb steps?  no do you drive?  yes use a wheelchair transfers alone Do you have any goals in this area?  yes  Function disabled: date disabled 05/2006 retired  Neuro/Psych No problems in this area  Prior Studies Any changes since last visit?  no  Physicians involved in your care Any changes since last visit?  no   Family History  Problem Relation Age of Onset  . Heart disease Brother    History   Social History  . Marital Status: Married    Spouse Name: N/A    Number of Children: N/A  . Years of Education: N/A   Social History Main Topics  . Smoking status: Former Smoker -- 0.50 packs/day for 6 years    Types: Cigarettes    Quit date: 12/03/1990  . Smokeless tobacco: Never Used  . Alcohol Use: 0.0 oz/week    0 Cans of beer per week     Comment: rare  . Drug Use: Yes  . Sexual Activity: Not Currently   Other Topics Concern  . None   Social History Narrative  . None   Past Surgical History  Procedure Laterality  Date  . Arm surgery    . Vein ligation and stripping    . Tummy tuck    . Stomach stapling    . Skin grafts    . I&d extremity  03/13/2011    Procedure: IRRIGATION AND DEBRIDEMENT EXTREMITY;  Surgeon: [name], MD;  Location: [place];  Service: Orthopedics;  Laterality: Left;  . Skin split graft  03/13/2012    Procedure: SKIN GRAFT SPLIT THICKNESS;  Surgeon: [name], MD;  Location: [place];  Service: Orthopedics;  Laterality: Left;  Excisional Debridement Left Leg, Split Thickness Skin Graft, Wound VAC  . Back stimulator    . Apligraft placement Left 07/31/2012    Procedure: Apply Skin Graft;  Surgeon: [name], MD;  Location: [place];  Service: Orthopedics;  Laterality: Left;  . Application of wound vac Left 07/31/2012    Procedure: APPLICATION OF WOUND VAC;  Surgeon: [name], MD;  Location: [place];  Service: Orthopedics;  Laterality: Left;  . Cardiac catheterization      Hx: of " around 1985 at [facility]"  . Cyst excision      Hx: of  . I&d extremity Left 11/06/2012    Procedure: IRRIGATION AND DEBRIDEMENT  EXTREMITY-Left lower ;  Surgeon: [name], MD;  Location: [place];  Service: Orthopedics;  Laterality: Left;  Left Leg Irrigation and Debridement, Place Skin Graft, Wound VAC, [place]  . Amputation Left 11/25/2012    Procedure: AMPUTATION BELOW KNEE;  Surgeon: [name], MD;  Location: [place];  Service: Orthopedics;  Laterality: Left;  Left Below Knee Amputation   Past Medical History  Diagnosis Date  . GERD (gastroesophageal reflux disease)   . Asthma   . Shortness of breath   . Leg pain   . Peripheral vascular disease   . Sleep apnea     uses bipap, sleep study done in [place] 2 years ago, dr [name]  . Anemia   . Headache(784.0)     sinus headaches  . COPD (chronic obstructive pulmonary disease)     3L continuous  . Peripheral neuropathy   . Cancer     hx basal cell skin cancer  . Psoriasis   . 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 CHF  (congestive heart failure)     does not see a cardiologist  . Diabetes mellitus     not on medication   BP 160/55  Pulse 75  Resp 16  SpO2 99%     Review of Systems  Cardiovascular: Positive for leg swelling.  All other systems reviewed and are negative.       Objective:   Physical Exam  General: Alert and oriented x 3, No apparent distress  HEENT: Head is normocephalic, atraumatic, PERRLA, EOMI, sclera anicteric, oral mucosa pink and moist, dentition intact, ext ear canals clear,  Neck: Supple without JVD or lymphadenopathy  Heart: Reg rate and rhythm. No murmurs rubs or gallops  Chest: CTA bilaterally without wheezes, rales, or rhonchi; no distress  Abdomen: Soft, non-tender, non-distended, bowel sounds positive.  Extremities: No clubbing, cyanosis, or edema. Pulses are 2+  Skin: scar/scab over central aspect of BK Neuro: Pt is cognitively appropriate with normal insight, memory, and awareness. Cranial nerves 2-12 are intact. Sensory exam is normal except for the distal LLE. Reflexes are 2+ in all 4's. Fine motor coordination is intact. No tremors. Motor function is grossly 5/5 except for the left leg which is 4 to 4+/5  Musculoskeletal: his left residual limb is well formed. Distal fibula is prominent.  Psych: Pt's affect is appropriate. Pt is cooperative    Assessment & Plan:   1. Left BKA  2. Chronic pain syndrome related to his chronic left leg pain  3. Hx of spinal stimulator  4. COPD    Plan:  1. Will continue with oxycontin 15mg  q12 with second rx for nex month. Fentanyl has stopped  2. Will reduce the oxycodone to 5mg  where he can take 2 in the am and 1 the rest of the day.   3. Fitting per [facility]. Needs total contact in socket.   4. Follow up with me in two month. 30 minutes of face to face patient care time were spent during this visit. All questions were encouraged and answered.

## 2013-05-23 DIAGNOSIS — S2249XA Multiple fractures of ribs, unspecified side, initial encounter for closed fracture: Secondary | ICD-10-CM

## 2013-05-23 DIAGNOSIS — E119 Type 2 diabetes mellitus without complications: Secondary | ICD-10-CM

## 2013-05-23 DIAGNOSIS — S42301A Unspecified fracture of shaft of humerus, right arm, initial encounter for closed fracture: Secondary | ICD-10-CM

## 2013-05-23 DIAGNOSIS — J449 Chronic obstructive pulmonary disease, unspecified: Secondary | ICD-10-CM

## 2013-05-23 DIAGNOSIS — S2220XA Unspecified fracture of sternum, initial encounter for closed fracture: Secondary | ICD-10-CM

## 2013-05-23 DIAGNOSIS — S21009A Unspecified open wound of unspecified breast, initial encounter: Secondary | ICD-10-CM

## 2013-05-23 DIAGNOSIS — S42302A Unspecified fracture of shaft of humerus, left arm, initial encounter for closed fracture: Secondary | ICD-10-CM

## 2013-05-23 DIAGNOSIS — R269 Unspecified abnormalities of gait and mobility: Secondary | ICD-10-CM

## 2013-05-23 DIAGNOSIS — I509 Heart failure, unspecified: Secondary | ICD-10-CM

## 2013-06-30 ENCOUNTER — Encounter: Payer: Medicare Other | Attending: Physical Medicine & Rehabilitation | Admitting: Physical Medicine & Rehabilitation

## 2013-06-30 ENCOUNTER — Encounter: Payer: Self-pay | Admitting: Physical Medicine & Rehabilitation

## 2013-06-30 VITALS — BP 123/52 | HR 63 | Resp 14 | Ht 70.0 in | Wt 265.0 lb

## 2013-06-30 DIAGNOSIS — G547 Phantom limb syndrome without pain: Secondary | ICD-10-CM

## 2013-06-30 DIAGNOSIS — Z79899 Other long term (current) drug therapy: Secondary | ICD-10-CM

## 2013-06-30 DIAGNOSIS — G546 Phantom limb syndrome with pain: Secondary | ICD-10-CM

## 2013-06-30 DIAGNOSIS — G894 Chronic pain syndrome: Secondary | ICD-10-CM

## 2013-06-30 DIAGNOSIS — S88119A Complete traumatic amputation at level between knee and ankle, unspecified lower leg, initial encounter: Secondary | ICD-10-CM

## 2013-06-30 DIAGNOSIS — I739 Peripheral vascular disease, unspecified: Secondary | ICD-10-CM

## 2013-06-30 DIAGNOSIS — E1159 Type 2 diabetes mellitus with other circulatory complications: Secondary | ICD-10-CM | POA: Insufficient documentation

## 2013-06-30 DIAGNOSIS — Z5181 Encounter for therapeutic drug level monitoring: Secondary | ICD-10-CM

## 2013-06-30 DIAGNOSIS — I798 Other disorders of arteries, arterioles and capillaries in diseases classified elsewhere: Secondary | ICD-10-CM

## 2013-06-30 DIAGNOSIS — E1151 Type 2 diabetes mellitus with diabetic peripheral angiopathy without gangrene: Secondary | ICD-10-CM

## 2013-06-30 MED ORDER — OXYCODONE HCL ER 15 MG PO T12A: 15.0000 mg | EXTENDED_RELEASE_TABLET | Freq: Two times a day (BID) | ORAL | Status: DC

## 2013-06-30 MED ORDER — OXYCODONE HCL 5 MG PO TABS: 5.0000 mg | ORAL_TABLET | Freq: Four times a day (QID) | ORAL | Status: DC | PRN

## 2013-06-30 NOTE — Progress Notes (Signed)
Subjective:    Patient ID: Jeremy [name], male    DOB: 06-16-1950, 63 y.o.   MRN: [id number]  HPI  Jeremy [name] is here regarding his chronic pain. His pain has been better for the most part. He is having more phantom limb pain particularly with therapy. He is also having some numbness in the distal limb which is associated with pain when he weight bears.    He is on oxycontin 15mg  q12 and oxy 5mg  5x per day  He's on topamax as well  He is participating in therapy. Typically it's twice per week. It has been "slow".  He is able to maneuver around his house, fix small meals, and essentially is independent while his wife is at work.     Pain Inventory Average Pain 3 Pain Right Now 0 My pain is sharp, stabbing and tingling  In the last 24 hours, has pain interfered with the following? General activity 0 Relation with others 0 Enjoyment of life 0 What TIME of day is your pain at its worst? morning Sleep (in general) Fair  Pain is worse with: inactivity Pain improves with: medication Relief from Meds: 8  Mobility walk with assistance use a walker how many minutes can you walk? 3-4 ability to climb steps?  no do you drive?  yes use a wheelchair needs help with transfers Do you have any goals in this area?  yes  Function retired  Neuro/Psych No problems in this area  Prior Studies Any changes since last visit?  no  Physicians involved in your care Any changes since last visit?  no   Family History  Problem Relation Age of Onset  . Heart disease Brother    History   Social History  . Marital Status: Married    Spouse Name: N/A    Number of Children: N/A  . Years of Education: N/A   Social History Main Topics  . Smoking status: Former Smoker -- 0.50 packs/day for 6 years    Types: Cigarettes    Quit date: 12/03/1990  . Smokeless tobacco: Never Used  . Alcohol Use: 0.0 oz/week    0 Cans of beer per week     Comment: rare  . Drug Use: Yes  . Sexual Activity:  Not Currently   Other Topics Concern  . None   Social History Narrative  . None   Past Surgical History  Procedure Laterality Date  . Arm surgery    . Vein ligation and stripping    . Tummy tuck    . Stomach stapling    . Skin grafts    . I&d extremity  03/13/2011    Procedure: IRRIGATION AND DEBRIDEMENT EXTREMITY;  Surgeon: [name], MD;  Location: [place];  Service: Orthopedics;  Laterality: Left;  . Skin split graft  03/13/2012    Procedure: SKIN GRAFT SPLIT THICKNESS;  Surgeon: [name], MD;  Location: [place];  Service: Orthopedics;  Laterality: Left;  Excisional Debridement Left Leg, Split Thickness Skin Graft, Wound VAC  . Back stimulator    . Apligraft placement Left 07/31/2012    Procedure: Apply Skin Graft;  Surgeon: [name], MD;  Location: [place];  Service: Orthopedics;  Laterality: Left;  . Application of wound vac Left 07/31/2012    Procedure: APPLICATION OF WOUND VAC;  Surgeon: [name], MD;  Location: [place];  Service: Orthopedics;  Laterality: Left;  . Cardiac catheterization      Hx: of "  around 1985 at [facility]"  . Cyst excision      Hx: of  . I&d extremity Left 11/06/2012    Procedure: IRRIGATION AND DEBRIDEMENT EXTREMITY-Left lower ;  Surgeon: [name], MD;  Location: [place];  Service: Orthopedics;  Laterality: Left;  Left Leg Irrigation and Debridement, Place Skin Graft, Wound VAC, [place]  . Amputation Left 11/25/2012    Procedure: AMPUTATION BELOW KNEE;  Surgeon: [name], MD;  Location: [place];  Service: Orthopedics;  Laterality: Left;  Left Below Knee Amputation   Past Medical History  Diagnosis Date  . GERD (gastroesophageal reflux disease)   . Asthma   . Shortness of breath   . Leg pain   . Peripheral vascular disease   . Sleep apnea     uses bipap, sleep study done in [employer] 2 years ago, dr [name]  . Anemia   . Headache(784.0)     sinus headaches  . COPD (chronic obstructive pulmonary disease)     3L continuous  .  Peripheral neuropathy   . Cancer     hx basal cell skin cancer  . Psoriasis   . 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 CHF (congestive heart failure)     does not see a cardiologist  . Diabetes mellitus     not on medication   BP 123/52  Pulse 63  Resp 14  Ht 5\' 10"  (1.778 m)  Wt 265 lb (120.203 kg)  BMI 38.02 kg/m2  SpO2 97%  Opioid Risk Score: 0 Fall Risk Score: High Fall Risk (>13 points) (pt educated on fall risk, brochure given to pt.)   Review of Systems     Objective:   Physical Exam  General: Alert and oriented x 3, No apparent distress  HEENT: Head is normocephalic, atraumatic, PERRLA, EOMI, sclera anicteric, oral mucosa pink and moist, dentition intact, ext ear canals clear,  Neck: Supple without JVD or lymphadenopathy  Heart: Reg rate and rhythm. No murmurs rubs or gallops  Chest: CTA bilaterally without wheezes, rales, or rhonchi; no distress  Abdomen: Soft, non-tender, non-distended, bowel sounds positive.  Extremities: No clubbing, cyanosis, or edema. Pulses are 2+  Skin: scar  over central aspect of BK --essentially healed. Neuro: Pt is cognitively appropriate with normal insight, memory, and awareness. Cranial nerves 2-12 are intact. Sensory exam is normal except for the distal LLE. Reflexes are 2+ in all 4's. Fine motor coordination is intact. No tremors. Motor function is grossly 5/5 except for the left leg which is 4 to 4+/5  Musculoskeletal: his left residual limb is well formed. Distal fibula is prominent. Non tender to touch. Psych: Pt's affect is appropriate. Pt is cooperative    Assessment & Plan:   1. Left BKA  2. Chronic pain syndrome related to his chronic left leg pain  3. Hx of spinal stimulator  4. COPD    Plan:  1. Will continue with oxycontin 15mg  q12 with second rx for nex month. #60  2. Oxycodone 5mg  up to 5x per day for breakthrough. #140  3. Fitting per [facility]. Needs total contact in socket. He will follow up again.    4. Consider trial of gabapentin for phantom limb pain to replace topamax. 5. Follow up with me in two months. 30 minutes of face to face patient care time were spent during this visit. All questions were encouraged and answered.

## 2013-06-30 NOTE — Patient Instructions (Signed)
PLEASE CALL ME WITH ANY PROBLEMS OR QUESTIONS (#[phone]).    Check with [facility] about your socket fit. Continue to closely observe your leg for fit.

## 2013-08-31 ENCOUNTER — Encounter: Payer: Medicare Other | Attending: Physical Medicine & Rehabilitation | Admitting: Physical Medicine & Rehabilitation

## 2013-08-31 ENCOUNTER — Encounter: Payer: Self-pay | Admitting: Physical Medicine & Rehabilitation

## 2013-08-31 VITALS — BP 131/63 | HR 70 | Resp 14 | Ht 70.0 in

## 2013-08-31 DIAGNOSIS — Z5181 Encounter for therapeutic drug level monitoring: Secondary | ICD-10-CM

## 2013-08-31 DIAGNOSIS — E1151 Type 2 diabetes mellitus with diabetic peripheral angiopathy without gangrene: Secondary | ICD-10-CM

## 2013-08-31 DIAGNOSIS — J449 Chronic obstructive pulmonary disease, unspecified: Secondary | ICD-10-CM

## 2013-08-31 DIAGNOSIS — I798 Other disorders of arteries, arterioles and capillaries in diseases classified elsewhere: Secondary | ICD-10-CM | POA: Insufficient documentation

## 2013-08-31 DIAGNOSIS — S88119A Complete traumatic amputation at level between knee and ankle, unspecified lower leg, initial encounter: Secondary | ICD-10-CM

## 2013-08-31 DIAGNOSIS — E1159 Type 2 diabetes mellitus with other circulatory complications: Secondary | ICD-10-CM

## 2013-08-31 DIAGNOSIS — G894 Chronic pain syndrome: Secondary | ICD-10-CM | POA: Insufficient documentation

## 2013-08-31 DIAGNOSIS — G547 Phantom limb syndrome without pain: Secondary | ICD-10-CM

## 2013-08-31 DIAGNOSIS — G546 Phantom limb syndrome with pain: Secondary | ICD-10-CM

## 2013-08-31 DIAGNOSIS — Z79899 Other long term (current) drug therapy: Secondary | ICD-10-CM

## 2013-08-31 MED ORDER — OXYCODONE HCL ER 15 MG PO T12A: 15.0000 mg | EXTENDED_RELEASE_TABLET | Freq: Two times a day (BID) | ORAL | Status: DC

## 2013-08-31 MED ORDER — OXYCODONE HCL 5 MG PO TABS: 5.0000 mg | ORAL_TABLET | Freq: Four times a day (QID) | ORAL | Status: DC | PRN

## 2013-08-31 MED ORDER — TOPIRAMATE 100 MG PO TABS: 100.0000 mg | ORAL_TABLET | Freq: Two times a day (BID) | ORAL | Status: DC

## 2013-08-31 MED ORDER — AMITRIPTYLINE HCL 25 MG PO TABS: 25.0000 mg | ORAL_TABLET | Freq: Every day | ORAL | Status: DC

## 2013-08-31 NOTE — Progress Notes (Signed)
Subjective:    Patient ID: Jeremy [name], male    DOB: 02/10/1951, 63 y.o.   MRN: [id number]  HPI  Jeremy [name] is back regarding his chronic pain syndrome. In general his current regimen of oxycontin and oxycodone seems to be managing his pain although he has been having more phantom pain as of late over the last month or so. The pain has been throughout the day, but it tends to be worst in the morning.   He has not walked with the prosthesis as he's still having problems with his stump. He hasn't worn the limb for 2 months. There are a couple areas of scab on the stump. He is wearing a stump shrinker daily. He hasn't followed up with surgery yet.  Pain Inventory Average Pain 3 Pain Right Now 2 My pain is sharp, stabbing and aching  In the last 24 hours, has pain interfered with the following? General activity 3 Relation with others 1 Enjoyment of life 6 What TIME of day is your pain at its worst? morning Sleep (in general) Poor  Pain is worse with: inactivity Pain improves with: medication Relief from Meds: 7  Mobility ability to climb steps?  no use a wheelchair  Function retired Do you have any goals in this area?  no  Neuro/Psych No problems in this area  Prior Studies Any changes since last visit?  no  Physicians involved in your care Any changes since last visit?  no   Family History  Problem Relation Age of Onset  . Heart disease Brother    History   Social History  . Marital Status: Married    Spouse Name: N/A    Number of Children: N/A  . Years of Education: N/A   Social History Main Topics  . Smoking status: Former Smoker -- 0.50 packs/day for 6 years    Types: Cigarettes    Quit date: 12/03/1990  . Smokeless tobacco: Never Used  . Alcohol Use: 0.0 oz/week    0 Cans of beer per week     Comment: rare  . Drug Use: Yes  . Sexual Activity: Not Currently   Other Topics Concern  . None   Social History Narrative  . None   Past Surgical  History  Procedure Laterality Date  . Arm surgery    . Vein ligation and stripping    . Tummy tuck    . Stomach stapling    . Skin grafts    . I&d extremity  03/13/2011    Procedure: IRRIGATION AND DEBRIDEMENT EXTREMITY;  Surgeon: [name], MD;  Location: [place];  Service: Orthopedics;  Laterality: Left;  . Skin split graft  03/13/2012    Procedure: SKIN GRAFT SPLIT THICKNESS;  Surgeon: [name], MD;  Location: [place];  Service: Orthopedics;  Laterality: Left;  Excisional Debridement Left Leg, Split Thickness Skin Graft, Wound VAC  . Back stimulator    . Apligraft placement Left 07/31/2012    Procedure: Apply Skin Graft;  Surgeon: [name], MD;  Location: [place];  Service: Orthopedics;  Laterality: Left;  . Application of wound vac Left 07/31/2012    Procedure: APPLICATION OF WOUND VAC;  Surgeon: [name], MD;  Location: [place];  Service: Orthopedics;  Laterality: Left;  . Cardiac catheterization      Hx: of " around 1985 at [facility]"  . Cyst excision      Hx: of  . I&d extremity Left 11/06/2012  Procedure: IRRIGATION AND DEBRIDEMENT EXTREMITY-Left lower ;  Surgeon: [name], MD;  Location: [place];  Service: Orthopedics;  Laterality: Left;  Left Leg Irrigation and Debridement, Place Skin Graft, Wound VAC, [place]  . Amputation Left 11/25/2012    Procedure: AMPUTATION BELOW KNEE;  Surgeon: [name], MD;  Location: [place];  Service: Orthopedics;  Laterality: Left;  Left Below Knee Amputation   Past Medical History  Diagnosis Date  . GERD (gastroesophageal reflux disease)   . Asthma   . Shortness of breath   . Leg pain   . Peripheral vascular disease   . Sleep apnea     uses bipap, sleep study done in [place] 2 years ago, dr Jeremy [name]  . Anemia   . Headache(784.0)     sinus headaches  . COPD (chronic obstructive pulmonary disease)     3L continuous  . Peripheral neuropathy   . Cancer     hx basal cell skin cancer  . Psoriasis   . DVT (deep venous  thrombosis)     LLE DVT '80's  . CHF (congestive heart failure)     does not see a cardiologist  . Diabetes mellitus     not on medication   BP 131/63  Pulse 70  Resp 14  Ht 5\' 10"  (1.778 m)  SpO2 99%  Opioid Risk Score:   Fall Risk Score: Moderate Fall Risk (6-13 points) (pt eductaed on fall risk, brochure given to pt previously)   Review of Systems  Constitutional: Positive for unexpected weight change.  All other systems reviewed and are negative.      Objective:   Physical Exam General: Alert and oriented x 3, No apparent distress  HEENT: Head is normocephalic, atraumatic, PERRLA, EOMI, sclera anicteric, oral mucosa pink and moist, dentition intact, ext ear canals clear,  Neck: Supple without JVD or lymphadenopathy  Heart: Reg rate and rhythm. No murmurs rubs or gallops  Chest: CTA bilaterally without wheezes, rales, or rhonchi; no distress  Abdomen: Soft, non-tender, non-distended, bowel sounds positive.  Extremities: No clubbing, cyanosis, or edema. Pulses are 2+  Skin: 2 areas of small scab centrally along limb. shrinker is a bit worn. Neuro: Pt is cognitively appropriate with normal insight, memory, and awareness. Cranial nerves 2-12 are intact. Sensory exam is normal except for the distal LLE. Reflexes are 2+ in all 4's. Fine motor coordination is intact. No tremors. Motor function is grossly 5/5 except for the left leg which is 4 to 4+/5  Musculoskeletal: his left residual limb is well formed. Distal fibula is prominent. Non tender to touch.  Psych: Pt's affect is appropriate. Pt is cooperative   Assessment & Plan:   1. Left BKA  2. Chronic pain syndrome related to his chronic left leg pain  3. Hx of spinal stimulator  4. COPD   Plan:  1. Will continue with oxycontin 15mg  q12 with second rx for nex month. #60  2. Oxycodone 5mg  up to 5x per day for breakthrough. #140  3. Fitting per [facility]. Needs total contact in socket. Would hold off on further adjustments  until edema is down distally and wounds are healed. If wounds don't heal, he'll need to see surgery.  4. 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 Will add elavil low dose to help with sleep and phantom limb pain. Reviewed massage and mirror therapy as well. 5. Follow up with me in two months. 30 minutes of face to face patient care time were spent  during this visit. All questions were encouraged and answered.            Assessment & Plan:

## 2013-08-31 NOTE — Patient Instructions (Signed)
WORK ON ELEVATING LEG. TRIPLE LAYER SHRINKER NEW SHRINKER IF NO HEALING, YOU NEED TO SEE DR. DUDA MASSAGE YOUR LEFT LEG, USE MIRROR FEEDBACK TO HELP WITH PHANTOM LIMB PAIN

## 2013-10-18 ENCOUNTER — Encounter: Payer: Self-pay | Admitting: Registered Nurse

## 2013-10-18 ENCOUNTER — Encounter: Payer: Medicare Other | Attending: Physical Medicine & Rehabilitation | Admitting: Registered Nurse

## 2013-10-18 VITALS — BP 123/52 | HR 68 | Resp 14 | Ht 70.0 in | Wt 280.0 lb

## 2013-10-18 DIAGNOSIS — G546 Phantom limb syndrome with pain: Secondary | ICD-10-CM

## 2013-10-18 DIAGNOSIS — S88112S Complete traumatic amputation at level between knee and ankle, left lower leg, sequela: Secondary | ICD-10-CM

## 2013-10-18 DIAGNOSIS — I798 Other disorders of arteries, arterioles and capillaries in diseases classified elsewhere: Secondary | ICD-10-CM

## 2013-10-18 DIAGNOSIS — J449 Chronic obstructive pulmonary disease, unspecified: Secondary | ICD-10-CM

## 2013-10-18 DIAGNOSIS — G547 Phantom limb syndrome without pain: Secondary | ICD-10-CM

## 2013-10-18 DIAGNOSIS — IMO0002 Reserved for concepts with insufficient information to code with codable children: Secondary | ICD-10-CM | POA: Insufficient documentation

## 2013-10-18 DIAGNOSIS — E1151 Type 2 diabetes mellitus with diabetic peripheral angiopathy without gangrene: Secondary | ICD-10-CM

## 2013-10-18 DIAGNOSIS — E1159 Type 2 diabetes mellitus with other circulatory complications: Secondary | ICD-10-CM

## 2013-10-18 DIAGNOSIS — G894 Chronic pain syndrome: Secondary | ICD-10-CM | POA: Insufficient documentation

## 2013-10-18 DIAGNOSIS — Z5181 Encounter for therapeutic drug level monitoring: Secondary | ICD-10-CM | POA: Insufficient documentation

## 2013-10-18 DIAGNOSIS — Z79899 Other long term (current) drug therapy: Secondary | ICD-10-CM | POA: Insufficient documentation

## 2013-10-18 MED ORDER — OXYCODONE HCL 5 MG PO TABS: 5.0000 mg | ORAL_TABLET | Freq: Four times a day (QID) | ORAL | Status: DC | PRN

## 2013-10-18 MED ORDER — OXYCODONE HCL ER 15 MG PO T12A: 15.0000 mg | EXTENDED_RELEASE_TABLET | Freq: Two times a day (BID) | ORAL | Status: DC

## 2013-10-18 NOTE — Progress Notes (Signed)
Subjective:    Patient ID: Jeremy [name], male    DOB: 1951/02/09, 63 y.o.   MRN: [id number]  HPI: [name] is a 63 year old male who returns for follow up for chronic pain and medication refill. He says his pain is located in his left stump. He rates hois pain 0 at this time. He does not follow a exercise routine. He does walk with walker in his home. He will be starting physical therapy as soon as his wife job displaces her. He is not wearing his prosthesis, has a stump shrinker in place. Wife in room, all questions answered.  Pain Inventory Average Pain 3 Pain Right Now 0 My pain is sharp, dull, stabbing and aching  In the last 24 hours, has pain interfered with the following? General activity 0 Relation with others 0 Enjoyment of life 0 What TIME of day is your pain at its worst? morning Sleep (in general) Fair  Pain is worse with: other Pain improves with: medication Relief from Meds: 10  Mobility use a walker how many minutes can you walk? 2-3 ability to climb steps?  no do you drive?  yes use a wheelchair transfers alone  Function retired  Neuro/Psych No problems in this area  Prior Studies Any changes since last visit?  no  Physicians involved in your care Any changes since last visit?  no   Family History  Problem Relation Age of Onset  . Heart disease Brother    History   Social History  . Marital Status: Married    Spouse Name: N/A    Number of Children: N/A  . Years of Education: N/A   Social History Main Topics  . Smoking status: Former Smoker -- 0.50 packs/day for 6 years    Types: Cigarettes    Quit date: 12/03/1990  . Smokeless tobacco: Never Used  . Alcohol Use: 0.0 oz/week    0 Cans of beer per week     Comment: rare  . Drug Use: Yes  . Sexual Activity: Not Currently   Other Topics Concern  . None   Social History Narrative  . None   Past Surgical History  Procedure Laterality Date  . Arm surgery    . Vein ligation  and stripping    . Tummy tuck    . Stomach stapling    . Skin grafts    . I&d extremity  03/13/2011    Procedure: IRRIGATION AND DEBRIDEMENT EXTREMITY;  Surgeon: [name], MD;  Location: [place];  Service: Orthopedics;  Laterality: Left;  . Skin split graft  03/13/2012    Procedure: SKIN GRAFT SPLIT THICKNESS;  Surgeon: [name], MD;  Location: [place];  Service: Orthopedics;  Laterality: Left;  Excisional Debridement Left Leg, Split Thickness Skin Graft, Wound VAC  . Back stimulator    . Apligraft placement Left 07/31/2012    Procedure: Apply Skin Graft;  Surgeon: [name], MD;  Location: [place];  Service: Orthopedics;  Laterality: Left;  . Application of wound vac Left 07/31/2012    Procedure: APPLICATION OF WOUND VAC;  Surgeon: [name], MD;  Location: [place];  Service: Orthopedics;  Laterality: Left;  . Cardiac catheterization      Hx: of " around 1985 at [facility]"  . Cyst excision      Hx: of  . I&d extremity Left 11/06/2012    Procedure: IRRIGATION AND DEBRIDEMENT EXTREMITY-Left lower ;  Surgeon: [name],  MD;  Location: [place];  Service: Orthopedics;  Laterality: Left;  Left Leg Irrigation and Debridement, Place Skin Graft, Wound VAC, [place]  . Amputation Left 11/25/2012    Procedure: AMPUTATION BELOW KNEE;  Surgeon: [name], MD;  Location: [place];  Service: Orthopedics;  Laterality: Left;  Left Below Knee Amputation   Past Medical History  Diagnosis Date  . GERD (gastroesophageal reflux disease)   . Asthma   . Shortness of breath   . Leg pain   . Peripheral vascular disease   . Sleep apnea     uses bipap, sleep study done in [place] 2 years ago, dr [name]  . Anemia   . Headache(784.0)     sinus headaches  . COPD (chronic obstructive pulmonary disease)     3L continuous  . Peripheral neuropathy   . Cancer     hx basal cell skin cancer  . Psoriasis   . 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 CHF (congestive heart failure)     does not  see a cardiologist  . Diabetes mellitus     not on medication   BP 123/52  Pulse 68  Resp 14  Ht 5\' 10"  (1.778 m)  Wt 280 lb (127.007 kg)  BMI 40.18 kg/m2  SpO2 96%  Opioid Risk Score:   Fall Risk Score: Moderate Fall Risk (6-13 points) (previously educated and given a handout for fall  prevention in the home)  Review of Systems  All other systems reviewed and are negative.      Objective:   Physical Exam  Nursing note and vitals reviewed. Constitutional: He is oriented to person, place, and time. He appears well-developed and well-nourished.  Morbid Obesity  HENT:  Head: Normocephalic and atraumatic.  Neck: Normal range of motion. Neck supple.  Cardiovascular: Normal rate and regular rhythm.   Pulmonary/Chest: Effort normal and breath sounds normal.  Continuous oxygen therapy at 3 liters nasal cannula  Musculoskeletal:  Normal Muscle Bulk and Muscle Testing Reveals: Upper Extremities: Full ROM and Muscle Strength 5/5 Back without spinal or paraspinal tenderness noted Lower Extremities: Right Leg Full ROM and Muscle Strength 5/5. Left BKA, no open areas noted. Arrived in wheelchair  Neurological: He is alert and oriented to person, place, and time.  Skin: Skin is warm and dry.  Psychiatric: He has a normal mood and affect.          Assessment & Plan:  1. Left BKA/ [place] Pain : Refilled Oxycontin 15 mg one tablet every 12 hours #60 and oxycodone 5 mg 1-2 tablets every 6 hours as needed #140. Second script given. Continue Topamax 2. Chronic pain syndrome related to his chronic left leg pain: Continue Current medication Reg[facility].  3. Hx of spinal stimulator  4. COPD: On Continuous Oxygen Therapy @ 3 liters Nasal Cannula. Pulmonology Following    30 minutes of face to face patient care time was spent during this visit. All questions were encouraged and answered.   F/U in 2 months

## 2013-10-29 ENCOUNTER — Ambulatory Visit: Payer: Medicare Other | Admitting: Registered Nurse

## 2013-11-01 IMAGING — US IR FLUORO GUIDE CV LINE*R*
1 series · 1 of 1 positions shown · non-contrast
Comparison: none

CLINICAL DATA: Cellulitis of left lower extremity, request
replacement PICC line

[Series 1: ir fluoro guide cv line*right* · 1 of 1 slices shown]
[im 1/1]
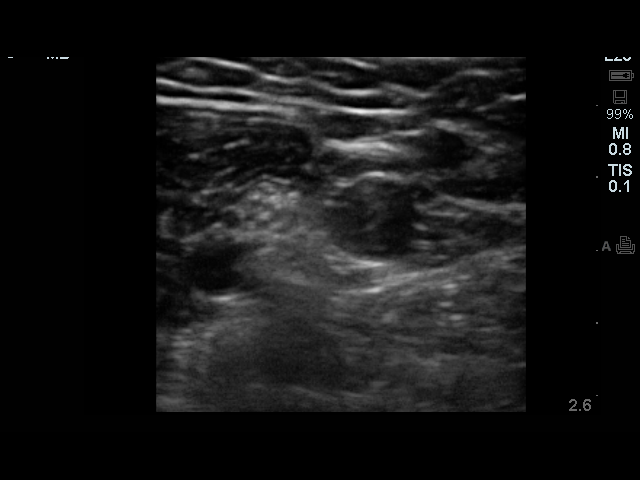

[1 of 1 positions shown; findings below may reference images not displayed]

PICC LINE PLACEMENT WITH ULTRASOUND AND FLUOROSCOPIC  GUIDANCE

The right arm was prepped with chlorhexidine, draped in the usual
sterile fashion using maximum barrier technique (cap and mask,
sterile gown, sterile gloves, large sterile sheet, hand hygiene and
cutaneous antisepsis) and infiltrated locally with 1% Lidocaine.

Ultrasound demonstrated patency of the right basilic vein, and this
was documented with an image.  Under real-time ultrasound guidance,
this vein was accessed with a 21 gauge micropuncture needle and
image documentation was performed.  The needle was exchanged over a
guidewire for a peel-away sheath through which a five French single
lumen PICC trimmed to 40 cm was advanced, positioned with its tip
at the lower SVC/right atrial junction.  Fluoroscopy during the
procedure and fluoro spot radiograph confirms appropriate catheter
position.  The catheter was flushed, secured to the skin with
Prolene sutures, and covered with a sterile dressing.

Complications:  None
IMPRESSION: Successful right arm PICC line placement with ultrasound and
fluoroscopic guidance.  The catheter is ready for use.

Read by Angiolina Fucarino

## 2013-12-08 ENCOUNTER — Encounter (HOSPITAL_BASED_OUTPATIENT_CLINIC_OR_DEPARTMENT_OTHER): Payer: Medicare Other | Attending: General Surgery

## 2013-12-08 DIAGNOSIS — S88119A Complete traumatic amputation at level between knee and ankle, unspecified lower leg, initial encounter: Secondary | ICD-10-CM | POA: Diagnosis not present

## 2013-12-08 DIAGNOSIS — L723 Sebaceous cyst: Secondary | ICD-10-CM | POA: Diagnosis not present

## 2013-12-08 DIAGNOSIS — L98499 Non-pressure chronic ulcer of skin of other sites with unspecified severity: Secondary | ICD-10-CM | POA: Insufficient documentation

## 2013-12-08 DIAGNOSIS — J45909 Unspecified asthma, uncomplicated: Secondary | ICD-10-CM | POA: Insufficient documentation

## 2013-12-08 DIAGNOSIS — Z7901 Long term (current) use of anticoagulants: Secondary | ICD-10-CM | POA: Insufficient documentation

## 2013-12-08 DIAGNOSIS — I10 Essential (primary) hypertension: Secondary | ICD-10-CM | POA: Insufficient documentation

## 2013-12-08 DIAGNOSIS — E119 Type 2 diabetes mellitus without complications: Secondary | ICD-10-CM | POA: Diagnosis not present

## 2013-12-08 DIAGNOSIS — L0591 Pilonidal cyst without abscess: Secondary | ICD-10-CM | POA: Insufficient documentation

## 2013-12-08 DIAGNOSIS — I509 Heart failure, unspecified: Secondary | ICD-10-CM | POA: Insufficient documentation

## 2013-12-08 NOTE — Progress Notes (Signed)
[facility]  NAME:  Jeremy [name], Jeremy [name]                   ACCOUNT NO.:  [id number]  MEDICAL RECORD NO.:  [id number]      DATE OF BIRTH:  01/29/1951  PHYSICIAN:  Judene Companion, M.D.      VISIT DATE:  12/08/2013                                  OFFICE VISIT   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  He also has a history of COPD, anemia, peripheral vascular disease, asthma and peripheral neuropathy.  He also has had congestive heart failure and has type 2 diabetes.  His complaint today is that he is draining from his pericoccygeal area.  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  There is about a 1 cm opening just below the coccyx and it is right in the scar border.  He had his pilonidal removed when he was very young.  He states that about 2 weeks ago,  it burst and drained a great deal of pus from this area.  It is conceivable that he may even have a fistula in ano and a perirectal abscess.  I probed this and I could not make any headway going into the anus.  This man is morbidly obese.  He weighs 270 pounds.  He has had a gastric bypass.  Today, his blood pressure was 130/57, pulse 60, temperature 98. He is on many medicines including albuterol, amitriptyline, Lasix, Singulair, vitamins, oxycodone, potassium, Zocor, and Coumadin at 5 mg a day.  Today, we just packed this with silver alginate and will have him come back in a week.  I have told our people at the desk to try to get him an appointment with a general surgeon, to see if they can do some help in operating on him to remove the pilonidal cyst if they agree that this is his overall problem.  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     Judene Companion, M.D.     PP/MEDQ  D:  12/08/2013  T:  12/08/2013  Job:  [id number]

## 2013-12-15 ENCOUNTER — Encounter: Payer: Self-pay | Admitting: Registered Nurse

## 2013-12-15 ENCOUNTER — Encounter: Payer: Medicare Other | Attending: Physical Medicine & Rehabilitation | Admitting: Registered Nurse

## 2013-12-15 ENCOUNTER — Ambulatory Visit: Payer: Medicare Other | Admitting: Registered Nurse

## 2013-12-15 VITALS — BP 116/59 | HR 64 | Resp 14 | Ht 70.0 in | Wt 270.0 lb

## 2013-12-15 DIAGNOSIS — Z79899 Other long term (current) drug therapy: Secondary | ICD-10-CM

## 2013-12-15 DIAGNOSIS — I798 Other disorders of arteries, arterioles and capillaries in diseases classified elsewhere: Secondary | ICD-10-CM | POA: Diagnosis present

## 2013-12-15 DIAGNOSIS — Z5181 Encounter for therapeutic drug level monitoring: Secondary | ICD-10-CM

## 2013-12-15 DIAGNOSIS — E1159 Type 2 diabetes mellitus with other circulatory complications: Secondary | ICD-10-CM | POA: Diagnosis present

## 2013-12-15 DIAGNOSIS — L0591 Pilonidal cyst without abscess: Secondary | ICD-10-CM | POA: Diagnosis not present

## 2013-12-15 DIAGNOSIS — G894 Chronic pain syndrome: Secondary | ICD-10-CM

## 2013-12-15 DIAGNOSIS — IMO0002 Reserved for concepts with insufficient information to code with codable children: Secondary | ICD-10-CM | POA: Diagnosis present

## 2013-12-15 DIAGNOSIS — G546 Phantom limb syndrome with pain: Secondary | ICD-10-CM

## 2013-12-15 DIAGNOSIS — I509 Heart failure, unspecified: Secondary | ICD-10-CM | POA: Diagnosis not present

## 2013-12-15 DIAGNOSIS — E1151 Type 2 diabetes mellitus with diabetic peripheral angiopathy without gangrene: Secondary | ICD-10-CM

## 2013-12-15 DIAGNOSIS — S88112S Complete traumatic amputation at level between knee and ankle, left lower leg, sequela: Secondary | ICD-10-CM

## 2013-12-15 DIAGNOSIS — E119 Type 2 diabetes mellitus without complications: Secondary | ICD-10-CM | POA: Diagnosis not present

## 2013-12-15 DIAGNOSIS — G547 Phantom limb syndrome without pain: Secondary | ICD-10-CM

## 2013-12-15 MED ORDER — AMITRIPTYLINE HCL 25 MG PO TABS: 25.0000 mg | ORAL_TABLET | Freq: Every day | ORAL | Status: DC

## 2013-12-15 MED ORDER — OXYCODONE HCL 5 MG PO TABS: 5.0000 mg | ORAL_TABLET | Freq: Four times a day (QID) | ORAL | Status: DC | PRN

## 2013-12-15 MED ORDER — OXYCODONE HCL ER 15 MG PO T12A: 15.0000 mg | EXTENDED_RELEASE_TABLET | Freq: Two times a day (BID) | ORAL | Status: DC

## 2013-12-15 NOTE — Progress Notes (Signed)
Subjective:    Patient ID: Jeremy [name], male    DOB: 1950/06/28, 63 y.o.   MRN: [id number]  HPI: [name] is a 63 year old male who returns for follow up for chronic pain and medication refill. He says his pain is located in his left arm and buttocks. He states he has a pilonidal cyst. He rates his pain 1. He does not follow a exercise routine. He was encouraged to start a chair exercise regime and this has been demonstrated. He verbalizes understanding. He does walk with walker in his home.  her. He is not wearing his prosthesis, and forgot to place his stump shrinker on. Encouraged to use the shrinker he verbalizes understanding.Wife in room, all questions answered.  His mother passed away on 2022/08/14 January 05, 2014, emotional support given.   Pain Inventory Average Pain 3 Pain Right Now 1 My pain is sharp, stabbing and aching  In the last 24 hours, has pain interfered with the following? General activity 0 Relation with others 0 Enjoyment of life 0 What TIME of day is your pain at its worst? morning Sleep (in general) Fair  Pain is worse with: inactivity Pain improves with: medication Relief from Meds: 10  Mobility walk with assistance use a walker ability to climb steps?  no do you drive?  yes use a wheelchair Do you have any goals in this area?  yes  Function not employed: date last employed 01/2006 retired  Neuro/Psych weakness numbness depression  Prior Studies Any changes since last visit?  no  Physicians involved in your care Any changes since last visit?  no   Family History  Problem Relation Age of Onset  . Heart disease Brother    History   Social History  . Marital Status: Married    Spouse Name: N/A    Number of Children: N/A  . Years of Education: N/A   Social History Main Topics  . Smoking status: Former Smoker -- 0.50 packs/day for 6 years    Types: Cigarettes    Quit date: 12/03/1990  . Smokeless tobacco: Never Used  . Alcohol Use: 0.0  oz/week    0 Cans of beer per week     Comment: rare  . Drug Use: Yes  . Sexual Activity: Not Currently   Other Topics Concern  . None   Social History Narrative  . None   Past Surgical History  Procedure Laterality Date  . Arm surgery    . Vein ligation and stripping    . Tummy tuck    . Stomach stapling    . Skin grafts    . I&d extremity  03/13/2011    Procedure: IRRIGATION AND DEBRIDEMENT EXTREMITY;  Surgeon: [name], MD;  Location: [facility];  Service: Orthopedics;  Laterality: Left;  . Skin split graft  03/13/2012    Procedure: SKIN GRAFT SPLIT THICKNESS;  Surgeon: [name], MD;  Location: [facility];  Service: Orthopedics;  Laterality: Left;  Excisional Debridement Left Leg, Split Thickness Skin Graft, Wound VAC  . Back stimulator    . Apligraft placement Left 07/31/2012    Procedure: Apply Skin Graft;  Surgeon: [name], MD;  Location: [facility];  Service: Orthopedics;  Laterality: Left;  . Application of wound vac Left 07/31/2012    Procedure: APPLICATION OF WOUND VAC;  Surgeon: [name], MD;  Location: [place];  Service: Orthopedics;  Laterality: Left;  . Cardiac catheterization  Hx: of " around 1985 at [facility]"  . Cyst excision      Hx: of  . I&d extremity Left 11/06/2012    Procedure: IRRIGATION AND DEBRIDEMENT EXTREMITY-Left lower ;  Surgeon: [name], MD;  Location: [facility];  Service: Orthopedics;  Laterality: Left;  Left Leg Irrigation and Debridement, Place Skin Graft, Wound VAC, [place]  . Amputation Left 11/25/2012    Procedure: AMPUTATION BELOW KNEE;  Surgeon: [name], MD;  Location: [facility];  Service: Orthopedics;  Laterality: Left;  Left Below Knee Amputation   Past Medical History  Diagnosis Date  . GERD (gastroesophageal reflux disease)   . Asthma   . Shortness of breath   . Leg pain   . Peripheral vascular disease   . Sleep apnea     uses bipap, sleep study done in [place] 2 years ago, dr [name]  . Anemia   . Headache(784.0)      sinus headaches  . COPD (chronic obstructive pulmonary disease)     3L continuous  . Peripheral neuropathy   . Cancer     hx basal cell skin cancer  . Psoriasis   . 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 CHF (congestive heart failure)     does not see a cardiologist  . Diabetes mellitus     not on medication   BP 116/59  Pulse 64  Resp 14  Ht 5\' 10"  (1.778 m)  Wt 270 lb (122.471 kg)  BMI 38.74 kg/m2  SpO2 95%  Opioid Risk Score:   Fall Risk Score: Moderate Fall Risk (6-13 points) (pt educated, decliend handout)    Review of Systems  Neurological: Positive for weakness and numbness.  Psychiatric/Behavioral:       Depression  All other systems reviewed and are negative.      Objective:   Physical Exam  Nursing note and vitals reviewed. Constitutional: He is oriented to person, place, and time. He appears well-developed and well-nourished.  HENT:  Head: Normocephalic and atraumatic.  Neck: Normal range of motion. Neck supple.  Cardiovascular: Normal rate and regular rhythm.   Pulmonary/Chest: Breath sounds normal.  Continuous oxygen at 3 liters nasal cannula  Musculoskeletal: He exhibits edema.  Normal Muscle Bulk and Muscle testing Reveals:  Upper extremities: Full ROM and Muscle Strength 5/5 Right AC Joint Tenderness with palpation. Back without spinal or paraspinal tenderness Lower extremities: Right Leg Full ROM and Muscle strength 5/5 Left BKA/ Edematous Sitting in wheelchair   Neurological: He is alert and oriented to person, place, and time.  Skin: Skin is warm and dry.  Psychiatric: He has a normal mood and affect.          Assessment & Plan:  1. Left BKA/ [place] Pain : Refilled Oxycontin 15 mg one tablet every 12 hours #60 and oxycodone 5 mg 1-2 tablets every 6 hours as needed #140. Second script given. Continue Topamax  2. 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  3. Hx of  spinal stimulator  4. COPD: On Continuous Oxygen Therapy @ 3 liters Nasal Cannula. Pulmonology Following   20 minutes of face to face patient care time was spent during this visit. All questions were encouraged and answered.   F/U in 2 months

## 2013-12-22 ENCOUNTER — Encounter (HOSPITAL_BASED_OUTPATIENT_CLINIC_OR_DEPARTMENT_OTHER): Payer: Medicare Other | Attending: General Surgery

## 2013-12-22 DIAGNOSIS — L0591 Pilonidal cyst without abscess: Secondary | ICD-10-CM | POA: Diagnosis present

## 2013-12-29 DIAGNOSIS — L0591 Pilonidal cyst without abscess: Secondary | ICD-10-CM | POA: Diagnosis not present

## 2014-01-04 ENCOUNTER — Telehealth: Payer: Self-pay | Admitting: *Deleted

## 2014-01-04 MED ORDER — AMITRIPTYLINE HCL 25 MG PO TABS: 25.0000 mg | ORAL_TABLET | Freq: Every day | ORAL | Status: DC

## 2014-01-04 NOTE — Telephone Encounter (Signed)
Called for refill on amitriptyline. Done and pt notified.

## 2014-01-05 ENCOUNTER — Ambulatory Visit (INDEPENDENT_AMBULATORY_CARE_PROVIDER_SITE_OTHER): Payer: Medicare Other | Admitting: Surgery

## 2014-01-28 ENCOUNTER — Other Ambulatory Visit: Payer: Self-pay | Admitting: Physical Medicine & Rehabilitation

## 2014-01-31 IMAGING — CR DG CHEST 2V
1 series · 1 of 1 positions shown · non-contrast
Comparison: July 29, 2012

CLINICAL DATA: Shortness of breath

CHEST - 2 VIEW

[w chest lat]
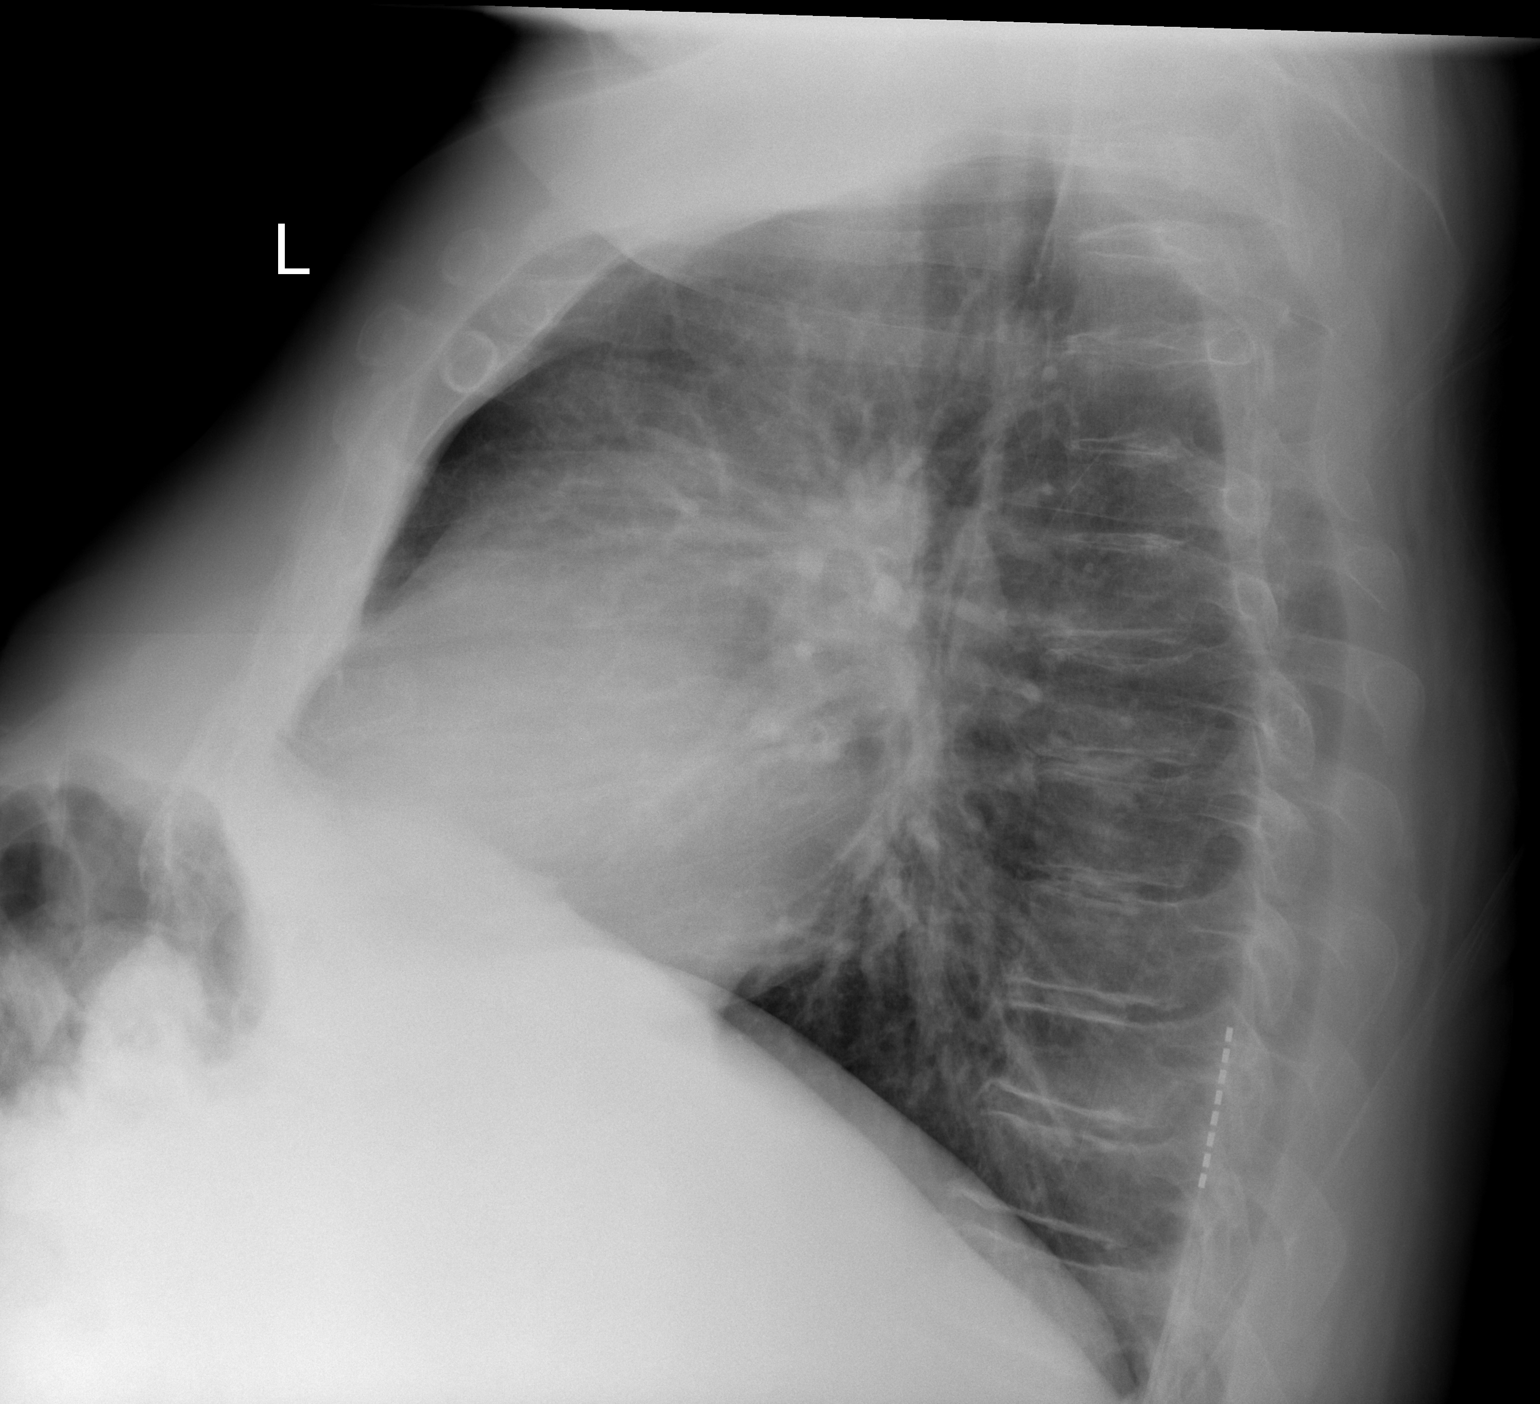

[1 of 1 positions shown; findings below may reference images not displayed]

FINDINGS: Lungs clear.  Heart is mildly enlarged with normal
pulmonary vascularity.  No adenopathy.

There is a thoracic stimulator with the tip at the level of T9.
IMPRESSION: No edema or consolidation.  Stable cardiac prominence.

## 2014-02-14 ENCOUNTER — Telehealth: Payer: Self-pay | Admitting: *Deleted

## 2014-02-14 IMAGING — CR DG CHEST 2V
2 series · 2 of 2 positions shown · non-contrast
Comparison: 11/16/2012

CLINICAL DATA: Cough, shortness of breath

CHEST - 2 VIEW

[w chest lat]
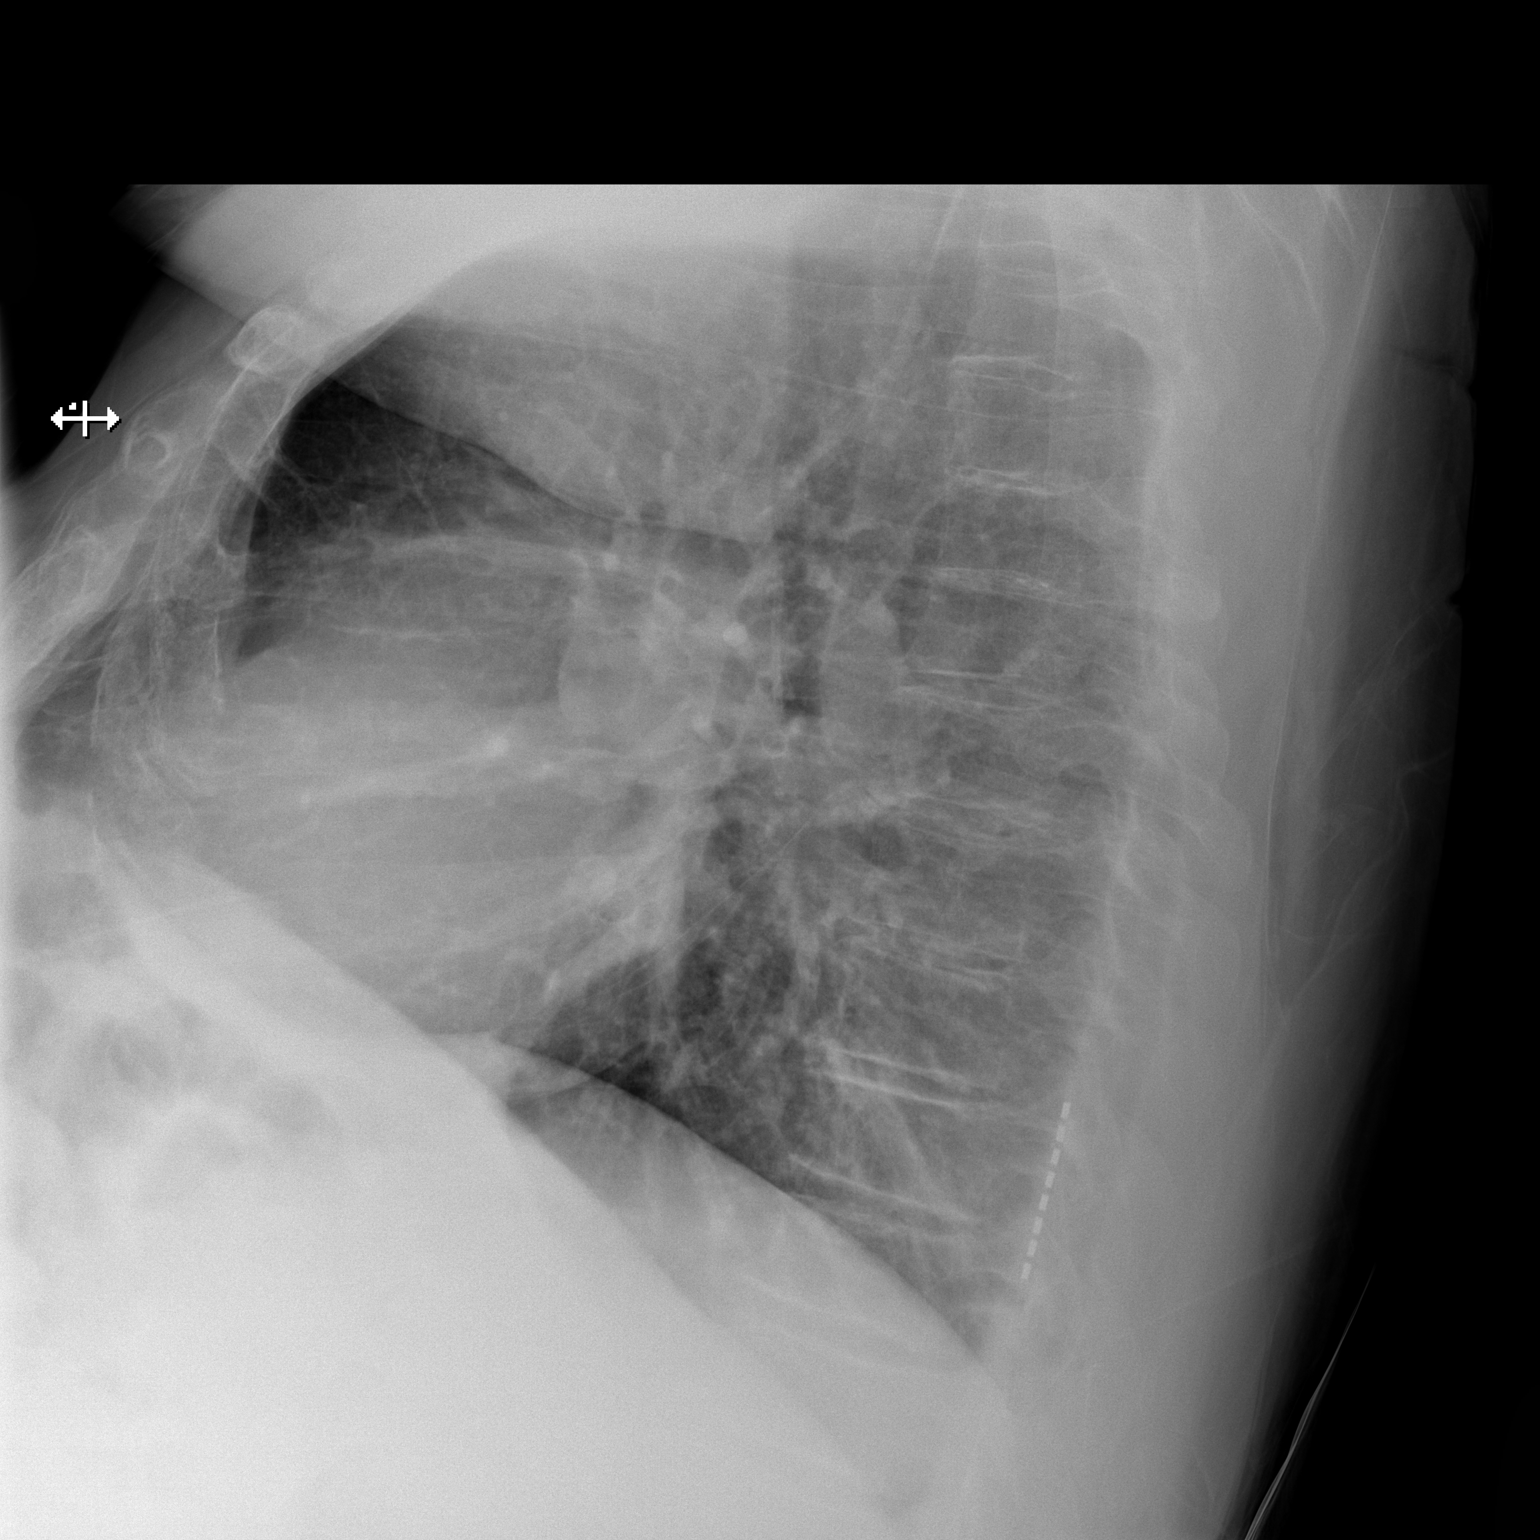

[x chest ap]
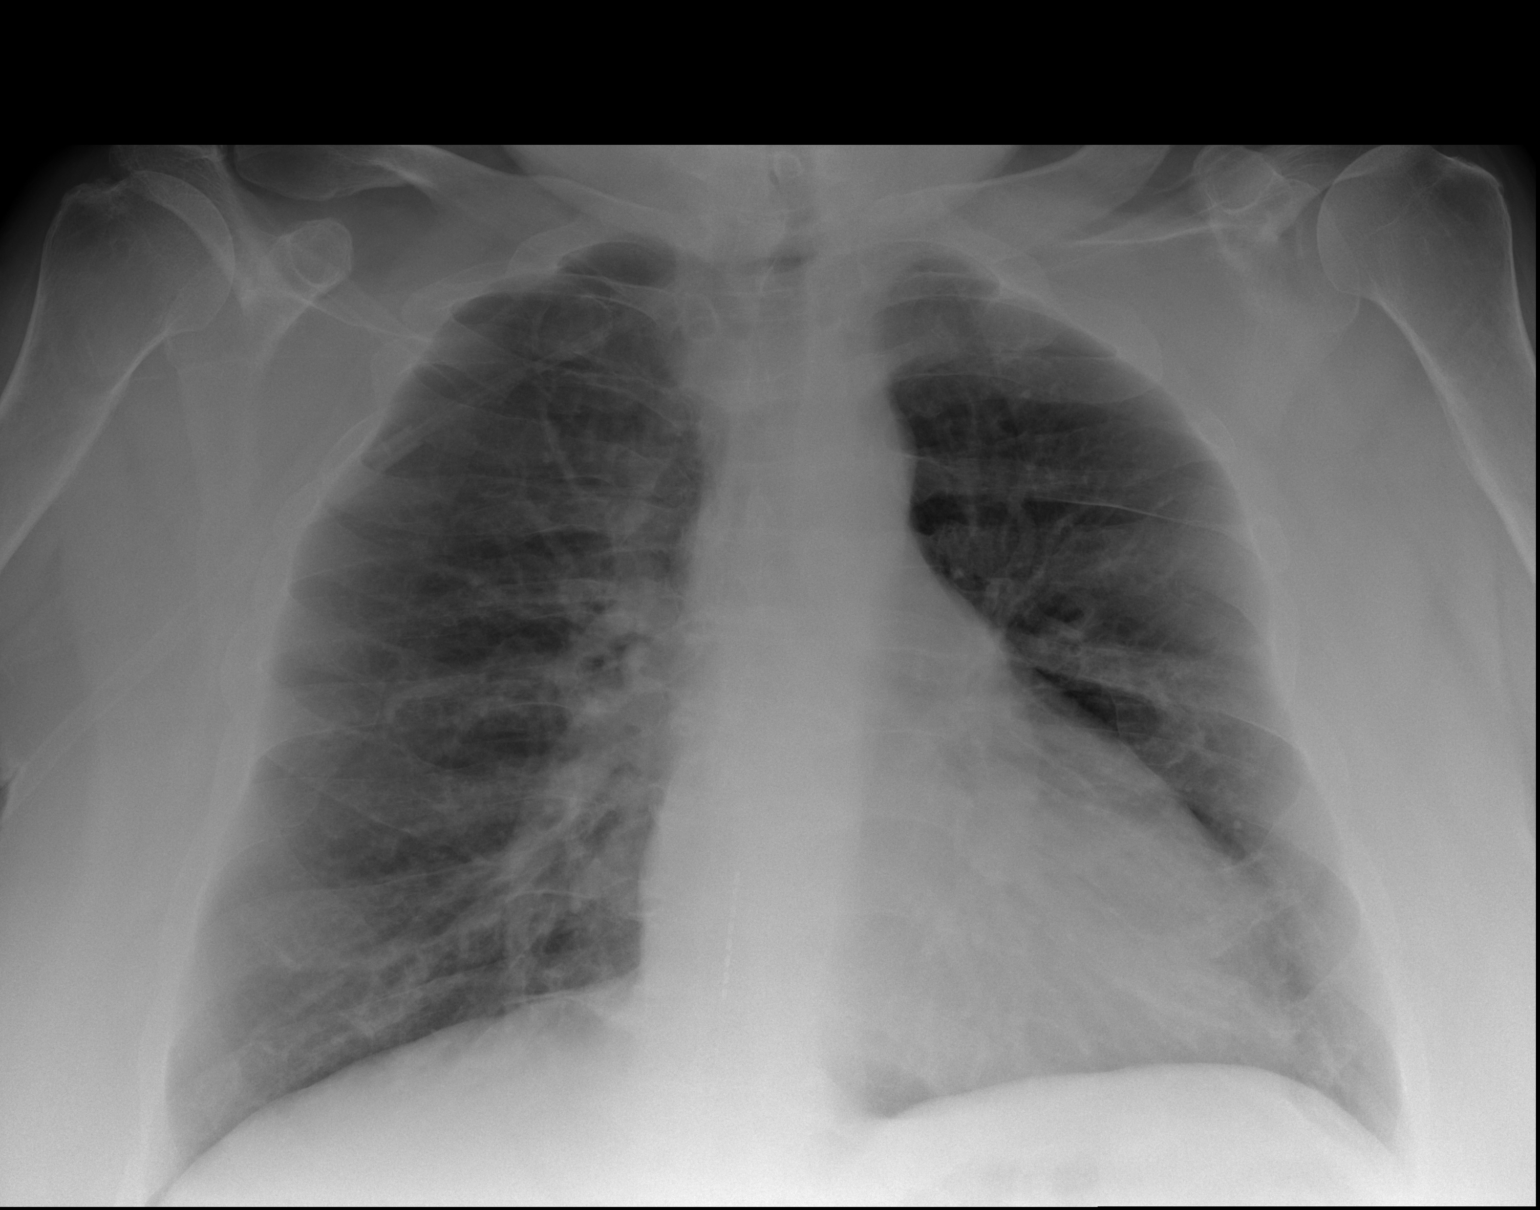

[2 of 2 positions shown; findings below may reference images not displayed]

FINDINGS: Cardiomegaly again noted.  No pulmonary edema.  A spinal
stimulation wire lower thoracic spine again noted. No segmental
infiltrate or pleural effusion.  Mild perihilar increased bronchial
markings without focal consolidation.  Bony thorax is stable.
IMPRESSION: No acute infiltrate or pulmonary edema.  Mild perihilar increased
bronchial markings without focal consolidation.

## 2014-02-14 NOTE — Telephone Encounter (Signed)
Pt. Called regarding a rx for oxycontin 15 mg. He is not being allowed to fill this prescription because he previously filled a rx for oxycodone 5 mg and the pharmacy explained that he has to wait 17 days after that rx was filled before can fill his new rx. Pt. Is asking for clarafication

## 2014-02-14 NOTE — Telephone Encounter (Signed)
Called pharmacy about pt meds... Oxycontin 15 mg is an ER to be taken every 12 hours.  The oxycodone is IR and is to be taken for break through pain.  Also called the patient and explained and told him I called the Pharmacy

## 2014-02-15 ENCOUNTER — Encounter: Payer: Medicare Other | Admitting: Physical Medicine & Rehabilitation

## 2014-02-18 ENCOUNTER — Encounter: Payer: Medicare Other | Attending: Physical Medicine & Rehabilitation | Admitting: Registered Nurse

## 2014-02-18 ENCOUNTER — Encounter: Payer: Self-pay | Admitting: Registered Nurse

## 2014-02-18 ENCOUNTER — Other Ambulatory Visit: Payer: Self-pay | Admitting: Physical Medicine & Rehabilitation

## 2014-02-18 VITALS — BP 124/54 | HR 74 | Resp 14

## 2014-02-18 DIAGNOSIS — S88112S Complete traumatic amputation at level between knee and ankle, left lower leg, sequela: Secondary | ICD-10-CM | POA: Diagnosis present

## 2014-02-18 DIAGNOSIS — R5381 Other malaise: Secondary | ICD-10-CM | POA: Insufficient documentation

## 2014-02-18 DIAGNOSIS — E1151 Type 2 diabetes mellitus with diabetic peripheral angiopathy without gangrene: Secondary | ICD-10-CM

## 2014-02-18 DIAGNOSIS — Z5181 Encounter for therapeutic drug level monitoring: Secondary | ICD-10-CM | POA: Diagnosis present

## 2014-02-18 DIAGNOSIS — G894 Chronic pain syndrome: Secondary | ICD-10-CM | POA: Diagnosis not present

## 2014-02-18 DIAGNOSIS — Z79899 Other long term (current) drug therapy: Secondary | ICD-10-CM | POA: Diagnosis present

## 2014-02-18 MED ORDER — OXYCODONE HCL ER 15 MG PO T12A: 15.0000 mg | EXTENDED_RELEASE_TABLET | Freq: Two times a day (BID) | ORAL | Status: DC

## 2014-02-18 MED ORDER — OXYCODONE HCL 5 MG PO TABS: 5.0000 mg | ORAL_TABLET | Freq: Four times a day (QID) | ORAL | Status: DC | PRN

## 2014-02-18 NOTE — Progress Notes (Signed)
Subjective:    Patient ID: Jeremy [name], male    DOB: 1950-11-10, 63 y.o.   MRN: [id number]  HPI: Jeremy [name] is a 63 year old male who returns for follow up for chronic pain and medication refill. He says his pain is located at the base of the spine due to the pilonidal cyst. He rates his pain 3. He does not follow a exercise routine. He hasbeen encouraged to increase his activity, his wife has noticed he has become weaker. We will order physical therapy (home). He verbalizes understanding. He is not wearing his prosthesis, and shrinker intact.  Wife in room all questions answered.  Pain Inventory Average Pain 3 Pain Right Now 3 My pain is intermittent, sharp, stabbing and aching  In the last 24 hours, has pain interfered with the following? General activity 5 Relation with others 5 Enjoyment of life 7 What TIME of day is your pain at its worst? morning Sleep (in general) Fair  Pain is worse with: inactivity Pain improves with: medication Relief from Meds: 7  Mobility use a walker how many minutes can you walk? 2-3 ability to climb steps?  no do you drive?  yes use a wheelchair transfers alone Do you have any goals in this area?  yes  Function disabled: date disabled no date provided I need assistance with the following:  dressing Do you have any goals in this area?  yes  Neuro/Psych depression  Prior Studies Any changes since last visit?  yes x-rays  Physicians involved in your care Any changes since last visit?  no   Family History  Problem Relation Age of Onset  . Heart disease Brother    History   Social History  . Marital Status: Married    Spouse Name: N/A    Number of Children: N/A  . Years of Education: N/A   Social History Main Topics  . Smoking status: Former Smoker -- 0.50 packs/day for 6 years    Types: Cigarettes    Quit date: 12/03/1990  . Smokeless tobacco: Never Used  . Alcohol Use: 0.0 oz/week    0 Cans of beer per week   Comment: rare  . Drug Use: Yes  . Sexual Activity: Not Currently   Other Topics Concern  . Not on file   Social History Narrative  . No narrative on file   Past Surgical History  Procedure Laterality Date  . Arm surgery    . Vein ligation and stripping    . Tummy tuck    . Stomach stapling    . Skin grafts    . I&d extremity  03/13/2011    Procedure: IRRIGATION AND DEBRIDEMENT EXTREMITY;  Surgeon: [name], MD;  Location: [facility];  Service: Orthopedics;  Laterality: Left;  . Skin split graft  03/13/2012    Procedure: SKIN GRAFT SPLIT THICKNESS;  Surgeon: [name], MD;  Location: [facility];  Service: Orthopedics;  Laterality: Left;  Excisional Debridement Left Leg, Split Thickness Skin Graft, Wound VAC  . Back stimulator    . Apligraft placement Left 07/31/2012    Procedure: Apply Skin Graft;  Surgeon: [name], MD;  Location: [facility];  Service: Orthopedics;  Laterality: Left;  . Application of wound vac Left 07/31/2012    Procedure: APPLICATION OF WOUND VAC;  Surgeon: [name], MD;  Location: [facility];  Service: Orthopedics;  Laterality: Left;  . Cardiac catheterization      Hx: of "  around 1985 at [facility]"  . Cyst excision      Hx: of  . I&d extremity Left 11/06/2012    Procedure: IRRIGATION AND DEBRIDEMENT EXTREMITY-Left lower ;  Surgeon: [name], MD;  Location: [facility];  Service: Orthopedics;  Laterality: Left;  Left Leg Irrigation and Debridement, Place Skin Graft, Wound VAC, [place]  . Amputation Left 11/25/2012    Procedure: AMPUTATION BELOW KNEE;  Surgeon: [name], MD;  Location: [facility];  Service: Orthopedics;  Laterality: Left;  Left Below Knee Amputation   Past Medical History  Diagnosis Date  . GERD (gastroesophageal reflux disease)   . Asthma   . Shortness of breath   . Leg pain   . Peripheral vascular disease   . Sleep apnea     uses bipap, sleep study done in [place] 2 years ago, dr [name]  . Anemia   . Headache(784.0)     sinus headaches    . COPD (chronic obstructive pulmonary disease)     3L continuous  . Peripheral neuropathy   . Cancer     hx basal cell skin cancer  . Psoriasis   . 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 CHF (congestive heart failure)     does not see a cardiologist  . Diabetes mellitus     not on medication   There were no vitals taken for this visit.  Opioid Risk Score:   Fall Risk Score:   Review of Systems     Objective:   Physical Exam  Nursing note and vitals reviewed. Constitutional: He is oriented to person, place, and time. He appears well-developed and well-nourished.  HENT:  Head: Normocephalic and atraumatic.  Neck: Normal range of motion. Neck supple.  Cardiovascular: Normal rate and regular rhythm.   Pulmonary/Chest: Effort normal and breath sounds normal.  Musculoskeletal:  Normal Muscle Bulk and Muscle Testing Reveals: Upper Extremities: Full ROM and Muscle Strength 5/5 Back without spinal or Paraspinal Tenderness Lower Extremities: Right: Full ROM and Muscle Strength 5/5. Left BKA with Shrinker intact Arrived in Wheelchair  Neurological: He is alert and oriented to person, place, and time.  Skin: Skin is warm and dry.  Psychiatric: He has a normal mood and affect.          Assessment & Plan:  1. Left BKA/ [place]om Pain : Refilled Oxycontin 15 mg one tablet every 12 hours #60 and oxycodone 5 mg 1-2 tablets every 6 hours as needed #140. Second script given. Continue Topamax  2. 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  3. Hx of spinal stimulator  4. COPD: On Continuous Oxygen Therapy @ 3 liters Nasal Cannula. Pulmonology Following  5. Deconditioning: Referral for Physical Therapy 20 minutes of face to face patient care time was spent during this visit. All questions were encouraged and answered.   F/U in 2 months

## 2014-02-19 LAB — PMP ALCOHOL METABOLITE (ETG): ETGU: NEGATIVE ng/mL

## 2014-02-22 LAB — OXYCODONE, URINE (LC/MS-MS)
NOROXYCODONE, UR: 6043 ng/mL (ref <50)
Oxycodone, ur: 3600 ng/mL (ref <50)
Oxymorphone: 2893 ng/mL (ref <50)

## 2014-02-22 LAB — OPIATES/OPIOIDS (LC/MS-MS)
CODEINE URINE: NEGATIVE ng/mL (ref <50)
Hydrocodone: NEGATIVE ng/mL (ref <50)
Hydromorphone: NEGATIVE ng/mL (ref <50)
MORPHINE: NEGATIVE ng/mL (ref <50)
NOROXYCODONE, UR: 6043 ng/mL (ref <50)
Norhydrocodone, Ur: NEGATIVE ng/mL (ref <50)
OXYMORPHONE, URINE: 2893 ng/mL (ref <50)
Oxycodone, ur: 3600 ng/mL (ref <50)

## 2014-02-23 LAB — PRESCRIPTION MONITORING PROFILE (SOLSTAS)
AMPHETAMINE/METH: NEGATIVE ng/mL
Barbiturate Screen, Urine: NEGATIVE ng/mL
Benzodiazepine Screen, Urine: NEGATIVE ng/mL
Buprenorphine, Urine: NEGATIVE ng/mL
CANNABINOID SCRN UR: NEGATIVE ng/mL
CARISOPRODOL, URINE: NEGATIVE ng/mL
COCAINE METABOLITES: NEGATIVE ng/mL
Creatinine, Urine: 142.09 mg/dL (ref >20.0)
Fentanyl, Ur: NEGATIVE ng/mL
MDMA URINE: NEGATIVE ng/mL
METHADONE SCREEN, URINE: NEGATIVE ng/mL
Meperidine, Ur: NEGATIVE ng/mL
NITRITES URINE, INITIAL: NEGATIVE ug/mL
Propoxyphene: NEGATIVE ng/mL
TRAMADOL UR: NEGATIVE ng/mL
Tapentadol, urine: NEGATIVE ng/mL
Zolpidem, Urine: NEGATIVE ng/mL
pH, Initial: 5.7 pH (ref 4.5–8.9)

## 2014-04-18 ENCOUNTER — Encounter: Payer: Medicare Other | Admitting: Registered Nurse

## 2014-04-27 ENCOUNTER — Encounter: Payer: Medicare Other | Attending: Physical Medicine & Rehabilitation | Admitting: Registered Nurse

## 2014-04-27 ENCOUNTER — Encounter: Payer: Self-pay | Admitting: Registered Nurse

## 2014-04-27 VITALS — BP 132/56 | HR 90 | Resp 14

## 2014-04-27 DIAGNOSIS — G894 Chronic pain syndrome: Secondary | ICD-10-CM | POA: Diagnosis not present

## 2014-04-27 DIAGNOSIS — R5381 Other malaise: Secondary | ICD-10-CM | POA: Insufficient documentation

## 2014-04-27 DIAGNOSIS — Z79899 Other long term (current) drug therapy: Secondary | ICD-10-CM | POA: Diagnosis present

## 2014-04-27 DIAGNOSIS — Z5181 Encounter for therapeutic drug level monitoring: Secondary | ICD-10-CM

## 2014-04-27 DIAGNOSIS — S88112S Complete traumatic amputation at level between knee and ankle, left lower leg, sequela: Secondary | ICD-10-CM | POA: Diagnosis present

## 2014-04-27 DIAGNOSIS — E1151 Type 2 diabetes mellitus with diabetic peripheral angiopathy without gangrene: Secondary | ICD-10-CM | POA: Insufficient documentation

## 2014-04-27 MED ORDER — OXYCODONE HCL ER 15 MG PO T12A: 15.0000 mg | EXTENDED_RELEASE_TABLET | Freq: Two times a day (BID) | ORAL | Status: DC

## 2014-04-27 MED ORDER — AMITRIPTYLINE HCL 25 MG PO TABS: 25.0000 mg | ORAL_TABLET | Freq: Every day | ORAL | Status: DC

## 2014-04-27 MED ORDER — OXYCODONE HCL 5 MG PO TABS: 5.0000 mg | ORAL_TABLET | Freq: Four times a day (QID) | ORAL | Status: DC | PRN

## 2014-04-27 NOTE — Progress Notes (Signed)
Subjective:    Patient ID: Jeremy [name], male    DOB: July 01, 1950, 64 y.o.   MRN: [id number]  HPI: Jeremy [name] is a 64 year old male who returns for follow up for chronic pain and medication refill. He says his pain is located in his right knee. He showed a picture of his sacral wound wound clinic following. He rates his pain 0 when he filled out his forms last night.. He's receiving physical therapy in his home twice a week .He's wearing his  shrinker intact left BKA.  Wife in room all questions answered.    Pain Inventory Average Pain 3 Pain Right Now 0 My pain is sharp, stabbing and aching  In the last 24 hours, has pain interfered with the following? General activity 2 Relation with others 2 Enjoyment of life 2 What TIME of day is your pain at its worst? morning Sleep (in general) Fair  Pain is worse with: walking, standing and some activites Pain improves with: medication Relief from Meds: 10  Mobility walk with assistance use a walker ability to climb steps?  no do you drive?  yes use a wheelchair transfers alone Do you have any goals in this area?  yes  Function disabled: date disabled . retired I need assistance with the following:  shopping  Neuro/Psych No problems in this area  Prior Studies Any changes since last visit?  no  Physicians involved in your care Any changes since last visit?  no   Family History  Problem Relation Age of Onset  . Heart disease Brother    History   Social History  . Marital Status: Married    Spouse Name: N/A    Number of Children: N/A  . Years of Education: N/A   Social History Main Topics  . Smoking status: Former Smoker -- 0.50 packs/day for 6 years    Types: Cigarettes    Quit date: 12/03/1990  . Smokeless tobacco: Never Used  . Alcohol Use: 0.0 oz/week    0 Cans of beer per week     Comment: rare  . Drug Use: Yes  . Sexual Activity: Not Currently   Other Topics Concern  . None   Social History  Narrative   Past Surgical History  Procedure Laterality Date  . Arm surgery    . Vein ligation and stripping    . Tummy tuck    . Stomach stapling    . Skin grafts    . I&d extremity  03/13/2011    Procedure: IRRIGATION AND DEBRIDEMENT EXTREMITY;  Surgeon: [name], MD;  Location: [facility];  Service: Orthopedics;  Laterality: Left;  . Skin split graft  03/13/2012    Procedure: SKIN GRAFT SPLIT THICKNESS;  Surgeon: [name], MD;  Location: [facility];  Service: Orthopedics;  Laterality: Left;  Excisional Debridement Left Leg, Split Thickness Skin Graft, Wound VAC  . Back stimulator    . Apligraft placement Left 07/31/2012    Procedure: Apply Skin Graft;  Surgeon: [name], MD;  Location: [facility];  Service: Orthopedics;  Laterality: Left;  . Application of wound vac Left 07/31/2012    Procedure: APPLICATION OF WOUND VAC;  Surgeon: [name], MD;  Location: [facility];  Service: Orthopedics;  Laterality: Left;  . Cardiac catheterization      Hx: of " around 1985 at [facility]"  . Cyst excision      Hx: of  . I&d extremity Left 11/06/2012  Procedure: IRRIGATION AND DEBRIDEMENT EXTREMITY-Left lower ;  Surgeon: [name], MD;  Location: [facility];  Service: Orthopedics;  Laterality: Left;  Left Leg Irrigation and Debridement, Place Skin Graft, Wound VAC, [place]  . Amputation Left 11/25/2012    Procedure: AMPUTATION BELOW KNEE;  Surgeon: [name], MD;  Location: [facility];  Service: Orthopedics;  Laterality: Left;  Left Below Knee Amputation   Past Medical History  Diagnosis Date  . GERD (gastroesophageal reflux disease)   . Asthma   . Shortness of breath   . Leg pain   . Peripheral vascular disease   . Sleep apnea     uses bipap, sleep study done in [place] 2 years ago, dr [name]  . Anemia   . Headache(784.0)     sinus headaches  . COPD (chronic obstructive pulmonary disease)     3L continuous  . Peripheral neuropathy   . Cancer     hx basal cell skin cancer  . Psoriasis     . 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 CHF (congestive heart failure)     does not see a cardiologist  . Diabetes mellitus     not on medication   BP 132/56 mmHg  Pulse 90  Resp 14  SpO2 95%  Opioid Risk Score:   Fall Risk Score: Moderate Fall Risk (6-13 points) (pt declined pamphlet, household is already setup)  Review of Systems  Constitutional: Positive for unexpected weight change.       Weight gain  Respiratory: Positive for cough, shortness of breath and wheezing.   All other systems reviewed and are negative.      Objective:   Physical Exam  Constitutional: He is oriented to person, place, and time. He appears well-developed and well-nourished.  HENT:  Head: Normocephalic and atraumatic.  Neck: Normal range of motion. Neck supple.  Cardiovascular: Normal rate and regular rhythm.   Pulmonary/Chest: Effort normal and breath sounds normal.  Continuous Oxygen 3 Liters Nasal Cannula  Musculoskeletal:  Normal Muscle Bulk and Muscle Testing reveals: Upper Extremities: Full ROM and Muscle Strength 5/5 Lower Extremities: Right Full ROM and Muscle Strength 5/5 Left BKA Arrived in Wheelchair  Neurological: He is alert and oriented to person, place, and time.  Skin: Skin is warm and dry.  Psychiatric: He has a normal mood and affect.  Nursing note and vitals reviewed.         Assessment & Plan:  1. Left BKA/ Phatom Pain : Refilled Oxycontin 15 mg one tablet every 12 hours # 32 and second script for #60 and oxycodone 5 mg 1-2 tablets every 6 hours as needed #140. Second script's given. Continue Topamax  2. 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  3. Hx of spinal stimulator  4. COPD: On Continuous Oxygen Therapy @ 3 liters Nasal Cannula. Pulmonology Following  5. Deconditioning: Continue with  Physical Therapy  20 minutes of face to face patient care time was spent during this visit. All  questions were encouraged and answered.   F/U in 2 months

## 2014-05-11 ENCOUNTER — Other Ambulatory Visit: Payer: Self-pay | Admitting: *Deleted

## 2014-05-11 MED ORDER — TOPIRAMATE 100 MG PO TABS: 100.0000 mg | ORAL_TABLET | Freq: Two times a day (BID) | ORAL | Status: DC

## 2014-05-11 NOTE — Telephone Encounter (Signed)
Patient called requesting that we refill his Topamax  100 mg BID.  This was sent in electronically.

## 2014-05-17 ENCOUNTER — Telehealth: Payer: Self-pay | Admitting: *Deleted

## 2014-05-17 NOTE — Telephone Encounter (Signed)
Could not fill RX under Dr. Naaman Plummer... Explained that Zella Ball is our NP and gave them her DEA # and License and the RX did go through.  He is all set at [facility] in [place]

## 2014-05-17 NOTE — Telephone Encounter (Signed)
Pharmacy called - trouble filling RX.  Gave them Jeremy [name]'s license & DEA numbers and the QP'Y went through. Called patient and told him that I took care of the problem, just call the pharmacy before going to pick it up.

## 2014-06-22 ENCOUNTER — Ambulatory Visit: Payer: Medicare Other | Admitting: Physical Medicine & Rehabilitation

## 2014-06-24 ENCOUNTER — Encounter: Payer: Self-pay | Admitting: Registered Nurse

## 2014-06-24 ENCOUNTER — Encounter: Payer: Medicare Other | Attending: Physical Medicine & Rehabilitation | Admitting: Registered Nurse

## 2014-06-24 ENCOUNTER — Other Ambulatory Visit: Payer: Self-pay | Admitting: Registered Nurse

## 2014-06-24 VITALS — BP 144/88 | HR 90 | Resp 14

## 2014-06-24 DIAGNOSIS — R5381 Other malaise: Secondary | ICD-10-CM | POA: Diagnosis present

## 2014-06-24 DIAGNOSIS — Z5181 Encounter for therapeutic drug level monitoring: Secondary | ICD-10-CM | POA: Insufficient documentation

## 2014-06-24 DIAGNOSIS — S88112S Complete traumatic amputation at level between knee and ankle, left lower leg, sequela: Secondary | ICD-10-CM | POA: Diagnosis present

## 2014-06-24 DIAGNOSIS — G894 Chronic pain syndrome: Secondary | ICD-10-CM | POA: Insufficient documentation

## 2014-06-24 DIAGNOSIS — E1151 Type 2 diabetes mellitus with diabetic peripheral angiopathy without gangrene: Secondary | ICD-10-CM | POA: Diagnosis present

## 2014-06-24 DIAGNOSIS — G546 Phantom limb syndrome with pain: Secondary | ICD-10-CM

## 2014-06-24 DIAGNOSIS — Z79899 Other long term (current) drug therapy: Secondary | ICD-10-CM | POA: Insufficient documentation

## 2014-06-24 MED ORDER — OXYCODONE HCL 5 MG PO TABS: 5.0000 mg | ORAL_TABLET | Freq: Four times a day (QID) | ORAL | Status: DC | PRN

## 2014-06-24 MED ORDER — OXYCODONE HCL ER 15 MG PO T12A: 15.0000 mg | EXTENDED_RELEASE_TABLET | Freq: Two times a day (BID) | ORAL | Status: DC

## 2014-06-24 NOTE — Progress Notes (Signed)
Subjective:    Patient ID: Jeremy [name], male    DOB: 1950/05/20, 65 y.o.   MRN: [id number]  HPI: [name] is a 64 year old male who returns for follow up for chronic pain and medication refill. He says his pain is located in his left stump. Also states " in the morning he has generalized pain all over".He rates his pain 0 when he filled out his forms last night. His current exercise regime is stretching exercises occasionally, encourage to increase his activity as tolerated. Completed physical therapy. Wife in room all questions answered.   Pain Inventory Average Pain 3 Pain Right Now 0 My pain is intermittent, stabbing and aching  In the last 24 hours, has pain interfered with the following? General activity 0 Relation with others 0 Enjoyment of life 0 What TIME of day is your pain at its worst? morning and daytime Sleep (in general) Fair  Pain is worse with: inactivity Pain improves with: rest and medication Relief from Meds: 5  Mobility use a walker how many minutes can you walk? 5 use a wheelchair  Function disabled: date disabled .  Neuro/Psych No problems in this area  Prior Studies Any changes since last visit?  no  Physicians involved in your care Any changes since last visit?  no   Family History  Problem Relation Age of Onset  . Heart disease Brother    History   Social History  . Marital Status: Married    Spouse Name: N/A  . Number of Children: N/A  . Years of Education: N/A   Social History Main Topics  . Smoking status: Former Smoker -- 0.50 packs/day for 6 years    Types: Cigarettes    Quit date: 12/03/1990  . Smokeless tobacco: Never Used  . Alcohol Use: 0.0 oz/week    0 Cans of beer per week     Comment: rare  . Drug Use: Yes  . Sexual Activity: Not Currently   Other Topics Concern  . None   Social History Narrative   Past Surgical History  Procedure Laterality Date  . Arm surgery    . Vein ligation and stripping    .  Tummy tuck    . Stomach stapling    . Skin grafts    . I&d extremity  03/13/2011    Procedure: IRRIGATION AND DEBRIDEMENT EXTREMITY;  Surgeon: Jeremy Minion, MD;  Location: [facility];  Service: Orthopedics;  Laterality: Left;  . Skin split graft  03/13/2012    Procedure: SKIN GRAFT SPLIT THICKNESS;  Surgeon: Jeremy Minion, MD;  Location: [facility];  Service: Orthopedics;  Laterality: Left;  Excisional Debridement Left Leg, Split Thickness Skin Graft, Wound VAC  . Back stimulator    . Apligraft placement Left 07/31/2012    Procedure: Apply Skin Graft;  Surgeon: Jeremy Minion, MD;  Location: [place];  Service: Orthopedics;  Laterality: Left;  . Application of wound vac Left 07/31/2012    Procedure: APPLICATION OF WOUND VAC;  Surgeon: Jeremy Minion, MD;  Location: [place];  Service: Orthopedics;  Laterality: Left;  . Cardiac catheterization      Hx: of " around 1985 at [facility]"  . Cyst excision      Hx: of  . I&d extremity Left 11/06/2012    Procedure: IRRIGATION AND DEBRIDEMENT EXTREMITY-Left lower ;  Surgeon: Jeremy Minion, MD;  Location: [place];  Service: Orthopedics;  Laterality: Left;  Left Leg Irrigation and Debridement,  Place Skin Graft, Wound VAC, [place]  . Amputation Left 11/25/2012    Procedure: AMPUTATION BELOW KNEE;  Surgeon: Jeremy Minion, MD;  Location: [facility] [place];  Service: Orthopedics;  Laterality: Left;  Left Below Knee Amputation   Past Medical History  Diagnosis Date  . GERD (gastroesophageal reflux disease)   . Asthma   . Shortness of breath   . Leg pain   . Peripheral vascular disease   . Sleep apnea     uses bipap, sleep study done in [place] 2 years ago, dr Jeremy [name]  . Anemia   . Headache(784.0)     sinus headaches  . COPD (chronic obstructive pulmonary disease)     3L continuous  . Peripheral neuropathy   . Cancer     hx basal cell skin cancer  . Psoriasis   . 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 CHF (congestive heart failure)     does not see a cardiologist    . Diabetes mellitus     not on medication   BP 144/88 mmHg  Pulse 90  Resp 14  SpO2 94%  Opioid Risk Score:   Fall Risk Score: Moderate Fall Risk (6-13 points)  Review of Systems  Constitutional: Negative.   HENT: Negative.   Eyes: Negative.   Respiratory: Positive for wheezing.        Sleep apnea/CPAP  Cardiovascular: Positive for leg swelling.  Endocrine: Negative.   Genitourinary: Negative.   Musculoskeletal: Negative.   Allergic/Immunologic: Negative.   Neurological: Negative.   Hematological: Negative.   Psychiatric/Behavioral: Negative.        Objective:   Physical Exam  Constitutional: He is oriented to person, place, and time. He appears well-developed and well-nourished.  HENT:  Head: Normocephalic and atraumatic.  Neck: Normal range of motion. Neck supple.  Cardiovascular: Normal rate and regular rhythm.   Pulmonary/Chest: Effort normal and breath sounds normal.  Continuous Oxygen at 3 liters nasal cannula  Musculoskeletal: He exhibits edema.  Normal Muscle Bulk and Muscle Testing Reveals: Upper Extremities: Full ROM and Muscle Strength 5/5 Back without spinal or paraspinal tenderness Lower Extremities: Right Full ROM and Muscle strength 5/5 Left BKA with Shrinker intact Arrived in wheelchair    Neurological: He is alert and oriented to person, place, and time.  Skin: Skin is warm and dry.  Psychiatric: He has a normal mood and affect.  Nursing note and vitals reviewed.         Assessment & Plan:  1. Left BKA/ [place] Pain : Refilled Oxycontin 15 mg one tablet every 12 hours  #60 and oxycodone 5 mg 1-2 tablets every 6 hours as needed #140. Second script's given. Continue Topamax  2. 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  3. Hx of spinal stimulator  4. COPD: On Continuous Oxygen Therapy @ 3 liters Nasal Cannula. Pulmonology Following   20 minutes of face to face patient care time  was spent during this visit. All questions were encouraged and answered.   F/U in 2 months

## 2014-06-25 LAB — PMP ALCOHOL METABOLITE (ETG): Ethyl Glucuronide (EtG): NEGATIVE ng/mL

## 2014-06-29 LAB — OPIATES/OPIOIDS (LC/MS-MS)
Codeine Urine: NEGATIVE ng/mL (ref <50)
Hydrocodone: NEGATIVE ng/mL (ref <50)
Hydromorphone: NEGATIVE ng/mL (ref <50)
MORPHINE: NEGATIVE ng/mL (ref <50)
Norhydrocodone, Ur: NEGATIVE ng/mL (ref <50)
Noroxycodone, Ur: 6140 ng/mL (ref <50)
Oxycodone, ur: 1987 ng/mL (ref <50)
Oxymorphone: 2095 ng/mL (ref <50)

## 2014-06-29 LAB — OXYCODONE, URINE (LC/MS-MS)
NOROXYCODONE, UR: 6140 ng/mL (ref <50)
Oxycodone, ur: 1987 ng/mL (ref <50)
Oxymorphone: 2095 ng/mL (ref <50)

## 2014-06-30 LAB — PRESCRIPTION MONITORING PROFILE (SOLSTAS)
AMPHETAMINE/METH: NEGATIVE ng/mL
BUPRENORPHINE, URINE: NEGATIVE ng/mL
Barbiturate Screen, Urine: NEGATIVE ng/mL
Benzodiazepine Screen, Urine: NEGATIVE ng/mL
CANNABINOID SCRN UR: NEGATIVE ng/mL
COCAINE METABOLITES: NEGATIVE ng/mL
Carisoprodol, Urine: NEGATIVE ng/mL
Creatinine, Urine: 103.34 mg/dL (ref >20.0)
Fentanyl, Ur: NEGATIVE ng/mL
MDMA URINE: NEGATIVE ng/mL
Meperidine, Ur: NEGATIVE ng/mL
Methadone Screen, Urine: NEGATIVE ng/mL
NITRITES URINE, INITIAL: NEGATIVE ug/mL
PH URINE, INITIAL: 6.1 pH (ref 4.5–8.9)
Propoxyphene: NEGATIVE ng/mL
TRAMADOL UR: NEGATIVE ng/mL
Tapentadol, urine: NEGATIVE ng/mL
Zolpidem, Urine: NEGATIVE ng/mL

## 2014-07-08 NOTE — Progress Notes (Signed)
Urine drug screen for this encounter is consistent for prescribed medication 

## 2014-08-24 ENCOUNTER — Ambulatory Visit: Payer: Medicare Other | Admitting: Physical Medicine & Rehabilitation

## 2014-08-26 ENCOUNTER — Encounter: Payer: Self-pay | Admitting: Registered Nurse

## 2014-08-26 ENCOUNTER — Encounter: Payer: Medicare Other | Attending: Physical Medicine & Rehabilitation | Admitting: Registered Nurse

## 2014-08-26 VITALS — BP 135/52 | HR 58 | Resp 16

## 2014-08-26 DIAGNOSIS — S88112S Complete traumatic amputation at level between knee and ankle, left lower leg, sequela: Secondary | ICD-10-CM

## 2014-08-26 DIAGNOSIS — G894 Chronic pain syndrome: Secondary | ICD-10-CM | POA: Diagnosis not present

## 2014-08-26 DIAGNOSIS — R5381 Other malaise: Secondary | ICD-10-CM | POA: Diagnosis present

## 2014-08-26 DIAGNOSIS — Z5181 Encounter for therapeutic drug level monitoring: Secondary | ICD-10-CM | POA: Diagnosis present

## 2014-08-26 DIAGNOSIS — G546 Phantom limb syndrome with pain: Secondary | ICD-10-CM

## 2014-08-26 DIAGNOSIS — E1151 Type 2 diabetes mellitus with diabetic peripheral angiopathy without gangrene: Secondary | ICD-10-CM

## 2014-08-26 DIAGNOSIS — I739 Peripheral vascular disease, unspecified: Secondary | ICD-10-CM

## 2014-08-26 DIAGNOSIS — Z79899 Other long term (current) drug therapy: Secondary | ICD-10-CM | POA: Diagnosis present

## 2014-08-26 MED ORDER — OXYCODONE HCL ER 15 MG PO T12A: 15.0000 mg | EXTENDED_RELEASE_TABLET | Freq: Two times a day (BID) | ORAL | Status: DC

## 2014-08-26 MED ORDER — OXYCODONE HCL 5 MG PO TABS: 5.0000 mg | ORAL_TABLET | Freq: Four times a day (QID) | ORAL | Status: DC | PRN

## 2014-08-26 NOTE — Progress Notes (Signed)
Subjective:    Patient ID: Jeremy [name], male    DOB: 07/21/1950, 64 y.o.   MRN: [id number]  HPI: [name] is a 64 year old male who returns for follow up for chronic pain and medication refill. He says his pain is located in his lower back and right knee.He rates his pain 0 when he filled out his form last night. His current exercise regime is performing stretching exercises  and walking in his home short distances such as going to the bathroom with his walker.  Wife in room all questions answered.  Pain Inventory Average Pain 5 Pain Right Now 0 My pain is sharp and stabbing  In the last 24 hours, has pain interfered with the following? General activity 0 Relation with others 0 Enjoyment of life 0 What TIME of day is your pain at its worst? morning Sleep (in general) Fair  Pain is worse with: sitting Pain improves with: medication Relief from Meds: 8  Mobility use a walker how many minutes can you walk? 3-5 ability to climb steps?  no do you drive?  yes  Function retired  Neuro/Psych No problems in this area  Prior Studies Any changes since last visit?  no  Physicians involved in your care Any changes since last visit?  no   Family History  Problem Relation Age of Onset  . Heart disease Brother    History   Social History  . Marital Status: Married    Spouse Name: N/A  . Number of Children: N/A  . Years of Education: N/A   Social History Main Topics  . Smoking status: Former Smoker -- 0.50 packs/day for 6 years    Types: Cigarettes    Quit date: 12/03/1990  . Smokeless tobacco: Never Used  . Alcohol Use: 0.0 oz/week    0 Cans of beer per week     Comment: rare  . Drug Use: Yes  . Sexual Activity: Not Currently   Other Topics Concern  . None   Social History Narrative   Past Surgical History  Procedure Laterality Date  . Arm surgery    . Vein ligation and stripping    . Tummy tuck    . Stomach stapling    . Skin grafts    . I&d  extremity  03/13/2011    Procedure: IRRIGATION AND DEBRIDEMENT EXTREMITY;  Surgeon: [name], MD;  Location: [place];  Service: Orthopedics;  Laterality: Left;  . Skin split graft  03/13/2012    Procedure: SKIN GRAFT SPLIT THICKNESS;  Surgeon: [name], MD;  Location: [facility];  Service: Orthopedics;  Laterality: Left;  Excisional Debridement Left Leg, Split Thickness Skin Graft, Wound VAC  . Back stimulator    . Apligraft placement Left 07/31/2012    Procedure: Apply Skin Graft;  Surgeon: [name], MD;  Location: [place];  Service: Orthopedics;  Laterality: Left;  . Application of wound vac Left 07/31/2012    Procedure: APPLICATION OF WOUND VAC;  Surgeon: [name], MD;  Location: [place];  Service: Orthopedics;  Laterality: Left;  . Cardiac catheterization      Hx: of " around 1985 at [facility]"  . Cyst excision      Hx: of  . I&d extremity Left 11/06/2012    Procedure: IRRIGATION AND DEBRIDEMENT EXTREMITY-Left lower ;  Surgeon: [name], MD;  Location: [place];  Service: Orthopedics;  Laterality: Left;  Left Leg Irrigation and Debridement, Place Skin  Graft, Wound VAC, [place]  . Amputation Left 11/25/2012    Procedure: AMPUTATION BELOW KNEE;  Surgeon: [name], MD;  Location: [facility];  Service: Orthopedics;  Laterality: Left;  Left Below Knee Amputation   Past Medical History  Diagnosis Date  . GERD (gastroesophageal reflux disease)   . Asthma   . Shortness of breath   . Leg pain   . Peripheral vascular disease   . Sleep apnea     uses bipap, sleep study done in [place] 2 years ago, dr [name]  . Anemia   . Headache(784.0)     sinus headaches  . COPD (chronic obstructive pulmonary disease)     3L continuous  . Peripheral neuropathy   . Cancer     hx basal cell skin cancer  . Psoriasis   . 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 CHF (congestive heart failure)     does not see a cardiologist  . Diabetes mellitus     not on medication   BP 135/52  mmHg  Pulse 58  Resp 16  SpO2 98%  Opioid Risk Score:   Fall Risk Score: Moderate Fall Risk (6-13 points) (previously educated and given handout)`1  Depression screen PHQ 2/9  No flowsheet data found.  Review of Systems  Constitutional: Positive for unexpected weight change.  Cardiovascular: Positive for leg swelling.  All other systems reviewed and are negative.      Objective:   Physical Exam  Constitutional: He is oriented to person, place, and time. He appears well-developed and well-nourished.  HENT:  Head: Normocephalic and atraumatic.  Neck: Normal range of motion. Neck supple.  Cardiovascular: Normal rate and regular rhythm.   Pulmonary/Chest: Effort normal and breath sounds normal.  Musculoskeletal: He exhibits edema.  Normal Muscle Bulk and Muscle Testing Reveals: Upper Extremities: Full ROM and Muscle Strength 5/5 Back without spinal or paraspinal tenderness Lower Extremities: Right: Full ROM and Muscle Strength 5/5. + Edema Right Lower Extremity Flexion Produces Pain into Patella Left: BKA/ Not wearing his shrinker Arrived in Wheelchair  Neurological: He is alert and oriented to person, place, and time.  Skin: Skin is warm and dry.  Psychiatric: He has a normal mood and affect.  Nursing note and vitals reviewed.         Assessment & Plan:  1. Left BKA/ [place] Pain : Refilled Oxycontin 15 mg one tablet every 12 hours #60 and oxycodone 5 mg 1-2 tablets every 6 hours as needed #140. Second script's given. Continue Topamax  2. 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  3. Hx of spinal stimulator  4. COPD: On Continuous Oxygen Therapy @ 3 liters Nasal Cannula. Pulmonology Following   20 minutes of face to face patient care time was spent during this visit. All questions were encouraged and answered.   F/U in 2 months

## 2014-09-06 ENCOUNTER — Other Ambulatory Visit: Payer: Self-pay | Admitting: Registered Nurse

## 2014-09-06 ENCOUNTER — Telehealth: Payer: Self-pay | Admitting: *Deleted

## 2014-09-06 NOTE — Telephone Encounter (Signed)
Pt called for refill on amitriptyline, electronic rx was sent earlier this morning

## 2014-10-28 ENCOUNTER — Encounter: Payer: Medicare Other | Attending: Physical Medicine & Rehabilitation | Admitting: Physical Medicine & Rehabilitation

## 2014-10-28 ENCOUNTER — Encounter: Payer: Self-pay | Admitting: Physical Medicine & Rehabilitation

## 2014-10-28 VITALS — BP 130/55 | HR 68 | Resp 18

## 2014-10-28 DIAGNOSIS — G546 Phantom limb syndrome with pain: Secondary | ICD-10-CM | POA: Diagnosis not present

## 2014-10-28 DIAGNOSIS — F341 Dysthymic disorder: Secondary | ICD-10-CM

## 2014-10-28 DIAGNOSIS — E1151 Type 2 diabetes mellitus with diabetic peripheral angiopathy without gangrene: Secondary | ICD-10-CM

## 2014-10-28 DIAGNOSIS — Z5181 Encounter for therapeutic drug level monitoring: Secondary | ICD-10-CM

## 2014-10-28 DIAGNOSIS — G894 Chronic pain syndrome: Secondary | ICD-10-CM | POA: Diagnosis not present

## 2014-10-28 DIAGNOSIS — R5381 Other malaise: Secondary | ICD-10-CM | POA: Insufficient documentation

## 2014-10-28 DIAGNOSIS — S88112S Complete traumatic amputation at level between knee and ankle, left lower leg, sequela: Secondary | ICD-10-CM | POA: Diagnosis not present

## 2014-10-28 DIAGNOSIS — J438 Other emphysema: Secondary | ICD-10-CM

## 2014-10-28 DIAGNOSIS — Z79899 Other long term (current) drug therapy: Secondary | ICD-10-CM | POA: Diagnosis present

## 2014-10-28 DIAGNOSIS — F329 Major depressive disorder, single episode, unspecified: Secondary | ICD-10-CM

## 2014-10-28 MED ORDER — OXYCODONE HCL 5 MG PO TABS: 5.0000 mg | ORAL_TABLET | Freq: Four times a day (QID) | ORAL | Status: DC | PRN

## 2014-10-28 MED ORDER — OXYCODONE HCL ER 15 MG PO T12A: 15.0000 mg | EXTENDED_RELEASE_TABLET | Freq: Two times a day (BID) | ORAL | Status: DC

## 2014-10-28 MED ORDER — DULOXETINE HCL 30 MG PO CPEP: 60.0000 mg | ORAL_CAPSULE | Freq: Every day | ORAL | Status: DC

## 2014-10-28 NOTE — Patient Instructions (Signed)
PLEASE CALL ME WITH ANY PROBLEMS OR QUESTIONS (#[phone]).  HAVE A GOOD DAY!    

## 2014-10-28 NOTE — Progress Notes (Signed)
Subjective:    Patient ID: Jeremy [name], male    DOB: 10-31-1950, 64 y.o.   MRN: [id number]  HPI   Jeremy [name] is here in follow up of his chronic pain syndrome. His pain levels have been fairly stable. He and his wife are concerned that he's back tracked in regards to his mobility. He did some therapy a couple months back but has lost ground since then largely due to some medical setbacks. He is wearing his prosthesis but rarely standing in it. He spends most of his day in the wheelchair and does little.    Pain Inventory Average Pain 3 Pain Right Now 3 My pain is sharp, stabbing and aching  In the last 24 hours, has pain interfered with the following? General activity 6 Relation with others 5 Enjoyment of life 5 What TIME of day is your pain at its worst? morning Sleep (in general) Fair  Pain is worse with: some activites Pain improves with: medication Relief from Meds: not answered  Mobility use a walker ability to climb steps?  no do you drive?  yes use a wheelchair  Function retired I need assistance with the following:  dressing, bathing, meal prep, household duties and shopping  Neuro/Psych trouble walking  Prior Studies Any changes since last visit?  no  Physicians involved in your care Any changes since last visit?  no   Family History  Problem Relation Age of Onset  . Heart disease Brother    History   Social History  . Marital Status: Married    Spouse Name: N/A  . Number of Children: N/A  . Years of Education: N/A   Social History Main Topics  . Smoking status: Former Smoker -- 0.50 packs/day for 6 years    Types: Cigarettes    Quit date: 12/03/1990  . Smokeless tobacco: Never Used  . Alcohol Use: 0.0 oz/week    0 Cans of beer per week     Comment: rare  . Drug Use: Yes  . Sexual Activity: Not Currently   Other Topics Concern  . None   Social History Narrative   Past Surgical History  Procedure Laterality Date  . Arm surgery    .  Vein ligation and stripping    . Tummy tuck    . Stomach stapling    . Skin grafts    . I&d extremity  03/13/2011    Procedure: IRRIGATION AND DEBRIDEMENT EXTREMITY;  Surgeon: [name], MD;  Location: [facility];  Service: Orthopedics;  Laterality: Left;  . Skin split graft  03/13/2012    Procedure: SKIN GRAFT SPLIT THICKNESS;  Surgeon: [name], MD;  Location: [place];  Service: Orthopedics;  Laterality: Left;  Excisional Debridement Left Leg, Split Thickness Skin Graft, Wound VAC  . Back stimulator    . Apligraft placement Left 07/31/2012    Procedure: Apply Skin Graft;  Surgeon: [name], MD;  Location: [facility];  Service: Orthopedics;  Laterality: Left;  . Application of wound vac Left 07/31/2012    Procedure: APPLICATION OF WOUND VAC;  Surgeon: [name], MD;  Location: [place];  Service: Orthopedics;  Laterality: Left;  . Cardiac catheterization      Hx: of " around 1985 at [facility]"  . Cyst excision      Hx: of  . I&d extremity Left 11/06/2012    Procedure: IRRIGATION AND DEBRIDEMENT EXTREMITY-Left lower ;  Surgeon: [name], MD;  Location: [place];  Service: Orthopedics;  Laterality: Left;  Left Leg Irrigation and Debridement, Place Skin Graft, Wound VAC, [place]  . Amputation Left 11/25/2012    Procedure: AMPUTATION BELOW KNEE;  Surgeon: [name], MD;  Location: [facility];  Service: Orthopedics;  Laterality: Left;  Left Below Knee Amputation   Past Medical History  Diagnosis Date  . GERD (gastroesophageal reflux disease)   . Asthma   . Shortness of breath   . Leg pain   . Peripheral vascular disease   . Sleep apnea     uses bipap, sleep study done in [place] 2 years ago, dr [name]  . Anemia   . Headache(784.0)     sinus headaches  . COPD (chronic obstructive pulmonary disease)     3L continuous  . Peripheral neuropathy   . Cancer     hx basal cell skin cancer  . Psoriasis   . 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 CHF (congestive heart failure)      does not see a cardiologist  . Diabetes mellitus     not on medication   BP 130/55 mmHg  Pulse 68  Resp 18  SpO2 95%  Opioid Risk Score:   Fall Risk Score: Moderate Fall Risk (6-13 points) (previously educated and given handout)`1  Depression screen PHQ 2/9  Depression screen PHQ 2/9 10/28/2014  Decreased Interest 0  Down, Depressed, Hopeless 0  PHQ - 2 Score 0  Altered sleeping 0  Tired, decreased energy 3  Change in appetite 3  Feeling bad or failure about yourself  2  Trouble concentrating 0  Moving slowly or fidgety/restless 0  Suicidal thoughts 0  PHQ-9 Score 8     Review of Systems  Musculoskeletal: Positive for gait problem.  All other systems reviewed and are negative.      Objective:   Physical Exam  Constitutional: He is oriented to person, place, and time. He appears well-developed and well-nourished. In wheelchair HENT:  Head: Normocephalic and atraumatic.  Neck: Normal range of motion. Neck supple.  Cardiovascular: Normal rate and regular rhythm.  Pulmonary/Chest: Effort normal and breath sounds normal.  Continuous Oxygen at 3 liters nasal cannula continues Musculoskeletal: He exhibits 1+ edema.  Normal Muscle Bulk and Muscle Testing Reveals: Upper Extremities: Full ROM and Muscle Strength 5/5 Back without spinal or paraspinal tenderness Lower Extremities: Right Full ROM and Muscle strength 5/5 Left BKA with flexion contracture -10 degrees.. Small scab on medial aspect of incision.    Neurological: He is alert and oriented to person, place, and time.  Skin: Skin is warm and dry.  Psychiatric: He is flat but cooperative         Assessment & Plan:  1. Left BKA/ Phantom Pain : Refilled Oxycontin 15 mg one tablet every 12 hours #60 and oxycodone 5 mg 1-2 tablets every 6 hours as needed #140. Second script's given for next month . --Continue Topamax  -needs to stretch leg---flexion contracture now -discussed need for increased daily  exercise if he wishes to remain mobile 2. Chronic pain syndrome related to left stump pain Continue Current medication Regime.  3. Hx of spinal stimulator  4. COPD: On Continuous Oxygen Therapy @ 3 liters Nasal Cannula per Pulmonology  5. Reactive depression: increase cymbalta to 60mg  in the AM which should help with mood and phantom symptoms.   20 minutes of face to face patient care time was spent during this visit. All questions were encouraged and  answered.   F/U in 2 months        Links    Previous Version           Assessment & Plan:

## 2014-12-01 ENCOUNTER — Telehealth: Payer: Self-pay | Admitting: Physical Medicine & Rehabilitation

## 2014-12-01 NOTE — Telephone Encounter (Signed)
Patient said it is [facility] in [place] number is [phone] gave [place] the wrong pharmacy--states [facility] and [facility]

## 2014-12-01 NOTE — Telephone Encounter (Signed)
I have spoken with Sharyn Lull at [facility] to find out what is going on with refill on medication, they said that Dr. Naaman Plummer DEA and NPI number are not linked up with his insurance--they have given me an override number of [id number] to give to pharmacy and said if they have any issues to call [phone].  [facility] and gave them override number and now will call patient to tell him that it should be okay for him to pick up his prescriptions.

## 2014-12-01 NOTE — Telephone Encounter (Signed)
Patient having issues with [facility] [place] getting his prescriptions refilled, please call patient--he is out of meds

## 2014-12-01 NOTE — Telephone Encounter (Signed)
I placed a call to [facility] and tried to find out what the issue is.  They kept putting me on hold every time the wait would cycle through, and I could not continually wait on hold.  I called back and left them a message to please call our office and let us know what the problem is with the refill,

## 2014-12-13 ENCOUNTER — Other Ambulatory Visit: Payer: Self-pay | Admitting: Physical Medicine & Rehabilitation

## 2014-12-15 ENCOUNTER — Other Ambulatory Visit: Payer: Self-pay | Admitting: Physical Medicine & Rehabilitation

## 2014-12-30 ENCOUNTER — Other Ambulatory Visit: Payer: Self-pay | Admitting: Physical Medicine & Rehabilitation

## 2014-12-30 ENCOUNTER — Encounter: Payer: Medicare Other | Attending: Physical Medicine & Rehabilitation | Admitting: Physical Medicine & Rehabilitation

## 2014-12-30 ENCOUNTER — Encounter: Payer: Self-pay | Admitting: Physical Medicine & Rehabilitation

## 2014-12-30 VITALS — BP 106/71 | HR 88

## 2014-12-30 DIAGNOSIS — J438 Other emphysema: Secondary | ICD-10-CM | POA: Diagnosis not present

## 2014-12-30 DIAGNOSIS — Z79899 Other long term (current) drug therapy: Secondary | ICD-10-CM

## 2014-12-30 DIAGNOSIS — G546 Phantom limb syndrome with pain: Secondary | ICD-10-CM

## 2014-12-30 DIAGNOSIS — S88112S Complete traumatic amputation at level between knee and ankle, left lower leg, sequela: Secondary | ICD-10-CM

## 2014-12-30 DIAGNOSIS — Z5181 Encounter for therapeutic drug level monitoring: Secondary | ICD-10-CM | POA: Diagnosis not present

## 2014-12-30 DIAGNOSIS — F329 Major depressive disorder, single episode, unspecified: Secondary | ICD-10-CM

## 2014-12-30 DIAGNOSIS — R5381 Other malaise: Secondary | ICD-10-CM | POA: Insufficient documentation

## 2014-12-30 DIAGNOSIS — G894 Chronic pain syndrome: Secondary | ICD-10-CM | POA: Diagnosis not present

## 2014-12-30 DIAGNOSIS — E1151 Type 2 diabetes mellitus with diabetic peripheral angiopathy without gangrene: Secondary | ICD-10-CM | POA: Diagnosis present

## 2014-12-30 DIAGNOSIS — F341 Dysthymic disorder: Secondary | ICD-10-CM

## 2014-12-30 MED ORDER — OXYCODONE HCL 5 MG PO TABS: 5.0000 mg | ORAL_TABLET | Freq: Four times a day (QID) | ORAL | Status: DC | PRN

## 2014-12-30 MED ORDER — OXYCODONE HCL ER 15 MG PO T12A: 15.0000 mg | EXTENDED_RELEASE_TABLET | Freq: Two times a day (BID) | ORAL | Status: DC

## 2014-12-30 MED ORDER — DULOXETINE HCL 30 MG PO CPEP: 30.0000 mg | ORAL_CAPSULE | Freq: Every day | ORAL | Status: DC

## 2014-12-30 NOTE — Patient Instructions (Signed)
YOU NEED TO WORK ON GOALS, EXERCISE, LEISURE AND SOCIAL ACTIVITIES TO OCCUPY YOUR MIND AND YOUR TIME.!!!!!

## 2014-12-30 NOTE — Progress Notes (Signed)
Subjective:    Patient ID: Jeremy [name], male    DOB: [birth year], 64 y.o.   MRN: [id number]  HPI  Mr. Stryker is here in follow up of his chronic pain. Things have remained fairly stable from our last visit. He is limited by weight, pain, and his pulmonary issue.   He is not using his prosthesis, let alone wearing it during the day.   He remains on BIPAP at night and continuous oxygen during the day. His wife thinks his breathing has gotten a little worse.   He remains on his oxycodone and oxycontin for pain control. They have remained fairly effective for contorlling his pain. He hasn't seen much difference with the increased cymbalta for either his mood or pain.   Pain Inventory Average Pain 4 Pain Right Now 1 My pain is sharp, stabbing and aching  In the last 24 hours, has pain interfered with the following? General activity 0 Relation with others 0 Enjoyment of life 0 What TIME of day is your pain at its worst? morning Sleep (in general) Fair  Pain is worse with: some activites Pain improves with: medication Relief from Meds: not answered  Mobility use a walker use a wheelchair needs help with transfers  Function disabled: date disabled . I need assistance with the following:  shopping  Neuro/Psych trouble walking  Prior Studies Any changes since last visit?  no  Physicians involved in your care Any changes since last visit?  no   Family History  Problem Relation Age of Onset  . Heart disease Brother    Social History   Social History  . Marital Status: Married    Spouse Name: N/A  . Number of Children: N/A  . Years of Education: N/A   Social History Main Topics  . Smoking status: Former Smoker -- 0.50 packs/day for 6 years    Types: Cigarettes    Quit date: 12/03/1990  . Smokeless tobacco: Never Used  . Alcohol Use: 0.0 oz/week    0 Cans of beer per week     Comment: rare  . Drug Use: Yes  . Sexual Activity: Not Currently   Other Topics  Concern  . None   Social History Narrative   Past Surgical History  Procedure Laterality Date  . Arm surgery    . Vein ligation and stripping    . Tummy tuck    . Stomach stapling    . Skin grafts    . I&d extremity  03/13/2011    Procedure: IRRIGATION AND DEBRIDEMENT EXTREMITY;  Surgeon: [name], MD;  Location: [place];  Service: Orthopedics;  Laterality: Left;  . Skin split graft  03/13/2012    Procedure: SKIN GRAFT SPLIT THICKNESS;  Surgeon: [name], MD;  Location: [place];  Service: Orthopedics;  Laterality: Left;  Excisional Debridement Left Leg, Split Thickness Skin Graft, Wound VAC  . Back stimulator    . Apligraft placement Left 07/31/2012    Procedure: Apply Skin Graft;  Surgeon: [name], MD;  Location: [place];  Service: Orthopedics;  Laterality: Left;  . Application of wound vac Left 07/31/2012    Procedure: APPLICATION OF WOUND VAC;  Surgeon: [name], MD;  Location: [place];  Service: Orthopedics;  Laterality: Left;  . Cardiac catheterization      Hx: of " around 1985 at [facility]"  . Cyst excision      Hx: of  . I&d extremity Left 11/06/2012    Procedure:  IRRIGATION AND DEBRIDEMENT EXTREMITY-Left lower ;  Surgeon: [name], MD;  Location: [place];  Service: Orthopedics;  Laterality: Left;  Left Leg Irrigation and Debridement, Place Skin Graft, Wound VAC, [place]  . Amputation Left 11/25/2012    Procedure: AMPUTATION BELOW KNEE;  Surgeon: [name], MD;  Location: [place];  Service: Orthopedics;  Laterality: Left;  Left Below Knee Amputation   Past Medical History  Diagnosis Date  . GERD (gastroesophageal reflux disease)   . Asthma   . Shortness of breath   . Leg pain   . Peripheral vascular disease   . Sleep apnea     uses bipap, sleep study done in [place] 2 years ago, dr [name]  . Anemia   . Headache(784.0)     sinus headaches  . COPD (chronic obstructive pulmonary disease)     3L continuous  . Peripheral neuropathy   . Cancer     hx  basal cell skin cancer  . Psoriasis   . 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 CHF (congestive heart failure)     does not see a cardiologist  . Diabetes mellitus     not on medication   BP 106/71 mmHg  Pulse 88  SpO2 94%  Opioid Risk Score:   Fall Risk Score:  `1  Depression screen PHQ 2/9  Depression screen [facility] 2/9 12/30/2014 10/28/2014  Decreased Interest 0 0  Down, Depressed, Hopeless 0 0  PHQ - 2 Score 0 0  Altered sleeping - 0  Tired, decreased energy - 3  Change in appetite - 3  Feeling bad or failure about yourself  - 2  Trouble concentrating - 0  Moving slowly or fidgety/restless - 0  Suicidal thoughts - 0  PHQ-9 Score - 8    Review of Systems  Musculoskeletal: Positive for gait problem.  All other systems reviewed and are negative.      Objective:   Physical Exam   Constitutional: He is oriented to person, place, and time. He appears well-developed and well-nourished. In wheelchair  HENT:  Head: Normocephalic and atraumatic.  Neck: Normal range of motion. Neck supple.  Cardiovascular: Normal rate and regular rhythm.  Pulmonary/Chest: Effort normal and breath sounds normal.  Continuous Oxygen at 3 liters nasal cannula continues Musculoskeletal: He exhibits 1+ edema.  Normal Muscle Bulk and Muscle Testing Reveals: Upper Extremities: Full ROM and Muscle Strength 5/5 Back without spinal or paraspinal tenderness Lower Extremities: Right Full ROM and Muscle strength 5/5 Left BKA with flexion contracture -10 degrees. Leg well shaped. Neurological: He is alert and oriented to person, place, and time.  Skin: Skin is warm and dry.  Psychiatric: He is pleasant and cooperative     Assessment & Plan:    1. Left BKA/ Phantom Pain : Refilled Oxycontin 15 mg one tablet every 12 hours #60 and oxycodone 5 mg 1-2 tablets every 6 hours as needed #140. Second script's given for next month  -would like to look at weaning meds potentially over the next few  months.  . --Continue Topamax also  -discussed need for increased daily exercise and infusion of leisure, social activities to help mood and pain.  2. 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  3. Hx of spinal stimulator  4. COPD: On Continuous Oxygen Therapy @ 3 liters Nasal Cannula per Pulmonology  5. Reactive depression: will reduce cymbalta back to 30mg   20 minutes  of face to face patient care time was spent during this visit. All questions were encouraged and answered.   F/U in 2 months

## 2014-12-31 LAB — PMP ALCOHOL METABOLITE (ETG): Ethyl Glucuronide (EtG): NEGATIVE ng/mL

## 2015-01-06 LAB — OPIATES/OPIOIDS (LC/MS-MS)
CODEINE URINE: NEGATIVE ng/mL (ref <50)
Hydrocodone: NEGATIVE ng/mL (ref <50)
Hydromorphone: NEGATIVE ng/mL (ref <50)
MORPHINE: NEGATIVE ng/mL (ref <50)
Norhydrocodone, Ur: NEGATIVE ng/mL (ref <50)
Noroxycodone, Ur: >10000 ng/mL (ref <50)
OXYMORPHONE, URINE: 1458 ng/mL (ref <50)
Oxycodone, ur: 3018 ng/mL (ref <50)

## 2015-01-06 LAB — OXYCODONE, URINE (LC/MS-MS)
OXYCODONE, UR: 3018 ng/mL (ref <50)
OXYMORPHONE, URINE: 1458 ng/mL (ref <50)

## 2015-01-07 LAB — PRESCRIPTION MONITORING PROFILE (SOLSTAS)
Amphetamine/Meth: NEGATIVE ng/mL
BENZODIAZEPINE SCREEN, URINE: NEGATIVE ng/mL
Barbiturate Screen, Urine: NEGATIVE ng/mL
Buprenorphine, Urine: NEGATIVE ng/mL
CANNABINOID SCRN UR: NEGATIVE ng/mL
COCAINE METABOLITES: NEGATIVE ng/mL
Carisoprodol, Urine: NEGATIVE ng/mL
Creatinine, Urine: 148.33 mg/dL (ref >20.0)
ECSTASY: NEGATIVE ng/mL
Fentanyl, Ur: NEGATIVE ng/mL
MEPERIDINE UR: NEGATIVE ng/mL
Methadone Screen, Urine: NEGATIVE ng/mL
Nitrites, Initial: NEGATIVE ug/mL
PH URINE, INITIAL: 5.2 pH (ref 4.5–8.9)
PROPOXYPHENE: NEGATIVE ng/mL
TAPENTADOLUR: NEGATIVE ng/mL
TRAMADOL UR: NEGATIVE ng/mL
Zolpidem, Urine: NEGATIVE ng/mL

## 2015-02-15 ENCOUNTER — Other Ambulatory Visit: Payer: Self-pay | Admitting: Physical Medicine & Rehabilitation

## 2015-02-17 NOTE — Progress Notes (Signed)
Urine drug screen for this encounter is consistent for prescribed medication 

## 2015-02-22 ENCOUNTER — Encounter: Payer: Medicare Other | Attending: Physical Medicine & Rehabilitation | Admitting: Physical Medicine & Rehabilitation

## 2015-02-22 ENCOUNTER — Encounter: Payer: Self-pay | Admitting: Physical Medicine & Rehabilitation

## 2015-02-22 VITALS — BP 122/67 | HR 63 | Resp 15

## 2015-02-22 DIAGNOSIS — L409 Psoriasis, unspecified: Secondary | ICD-10-CM | POA: Diagnosis not present

## 2015-02-22 DIAGNOSIS — G894 Chronic pain syndrome: Secondary | ICD-10-CM | POA: Diagnosis present

## 2015-02-22 DIAGNOSIS — Z5181 Encounter for therapeutic drug level monitoring: Secondary | ICD-10-CM | POA: Insufficient documentation

## 2015-02-22 DIAGNOSIS — G546 Phantom limb syndrome with pain: Secondary | ICD-10-CM

## 2015-02-22 DIAGNOSIS — S88112S Complete traumatic amputation at level between knee and ankle, left lower leg, sequela: Secondary | ICD-10-CM | POA: Insufficient documentation

## 2015-02-22 DIAGNOSIS — R5381 Other malaise: Secondary | ICD-10-CM | POA: Diagnosis present

## 2015-02-22 DIAGNOSIS — E1151 Type 2 diabetes mellitus with diabetic peripheral angiopathy without gangrene: Secondary | ICD-10-CM | POA: Diagnosis present

## 2015-02-22 DIAGNOSIS — Z79899 Other long term (current) drug therapy: Secondary | ICD-10-CM | POA: Insufficient documentation

## 2015-02-22 MED ORDER — OXYCODONE HCL 10 MG PO TABS: 10.0000 mg | ORAL_TABLET | Freq: Three times a day (TID) | ORAL | Status: DC | PRN

## 2015-02-22 NOTE — Progress Notes (Signed)
Subjective:    Patient ID: Jeremy [name], male    DOB: 04-Oct-1950, 64 y.o.   MRN: [id number]  HPI   [name] is here in follow up of his chronic pain and left BKA. He is using the oxy cr 15mg  as well as the oxy IR for pain control. He doesn't feel that the cr is providing much benefit.   He reports passing a kidney stone recently. It started as back pain initially until he passed the stone.   He remains on oxygen for his lung disease. He had some breakdwon from the oxygen line over his right ear. He's also had some lesions on his scalp from excessive scratching. He suffers from psoriasis at baseline.     Pain Inventory Average Pain 3 Pain Right Now 3 My pain is sharp, stabbing and aching  In the last 24 hours, has pain interfered with the following? General activity 3 Relation with others 3 Enjoyment of life 3 What TIME of day is your pain at its worst? morning Sleep (in general) NA  Pain is worse with: inactivity Pain improves with: medication Relief from Meds: 9  Mobility walk without assistance use a walker how many minutes can you walk? 2-3 do you drive?  yes use a wheelchair Do you have any goals in this area?  yes  Function disabled: date disabled 11/2005 Do you have any goals in this area?  no  Neuro/Psych No problems in this area  Prior Studies Any changes since last visit?  no  Physicians involved in your care Any changes since last visit?  no   Family History  Problem Relation Age of Onset  . Heart disease Brother    Social History   Social History  . Marital Status: Married    Spouse Name: N/A  . Number of Children: N/A  . Years of Education: N/A   Social History Main Topics  . Smoking status: Former Smoker -- 0.50 packs/day for 6 years    Types: Cigarettes    Quit date: 12/03/1990  . Smokeless tobacco: Never Used  . Alcohol Use: 0.0 oz/week    0 Cans of beer per week     Comment: rare  . Drug Use: Yes  . Sexual Activity: Not  Currently   Other Topics Concern  . None   Social History Narrative   Past Surgical History  Procedure Laterality Date  . Arm surgery    . Vein ligation and stripping    . Tummy tuck    . Stomach stapling    . Skin grafts    . I&d extremity  03/13/2011    Procedure: IRRIGATION AND DEBRIDEMENT EXTREMITY;  Surgeon: [name], MD;  Location: [place];  Service: Orthopedics;  Laterality: Left;  . Skin split graft  03/13/2012    Procedure: SKIN GRAFT SPLIT THICKNESS;  Surgeon: [name], MD;  Location: [place];  Service: Orthopedics;  Laterality: Left;  Excisional Debridement Left Leg, Split Thickness Skin Graft, Wound VAC  . Back stimulator    . Apligraft placement Left 07/31/2012    Procedure: Apply Skin Graft;  Surgeon: [name], MD;  Location: [place];  Service: Orthopedics;  Laterality: Left;  . Application of wound vac Left 07/31/2012    Procedure: APPLICATION OF WOUND VAC;  Surgeon: [name], MD;  Location: [place];  Service: Orthopedics;  Laterality: Left;  . Cardiac catheterization      Hx: of " around 1985 at [facility]"  .  Cyst excision      Hx: of  . I&d extremity Left 11/06/2012    Procedure: IRRIGATION AND DEBRIDEMENT EXTREMITY-Left lower ;  Surgeon: [name], MD;  Location: [place];  Service: Orthopedics;  Laterality: Left;  Left Leg Irrigation and Debridement, Place Skin Graft, Wound VAC, [place]  . Amputation Left 11/25/2012    Procedure: AMPUTATION BELOW KNEE;  Surgeon: [name], MD;  Location: [place];  Service: Orthopedics;  Laterality: Left;  Left Below Knee Amputation   Past Medical History  Diagnosis Date  . GERD (gastroesophageal reflux disease)   . Asthma   . Shortness of breath   . Leg pain   . Peripheral vascular disease ([place])   . Sleep apnea     uses bipap, sleep study done in [place] 2 years ago, dr [name]  . Anemia   . Headache(784.0)     sinus headaches  . COPD (chronic obstructive pulmonary disease) (HCC)     3L continuous  .  Peripheral neuropathy ([place])   . Cancer ([place])     hx basal cell skin cancer  . Psoriasis   . DVT (deep venous thrombosis) (HCC)     LLE DVT '80's  . CHF (congestive heart failure) ([place])     does not see a cardiologist  . Diabetes mellitus     not on medication   BP 122/67 mmHg  Pulse 63  Resp 15  SpO2 97%  Opioid Risk Score:   Fall Risk Score:  `1  Depression screen PHQ 2/9  Depression screen [facility] 2/9 12/30/2014 10/28/2014  Decreased Interest 0 0  Down, Depressed, Hopeless 0 0  PHQ - 2 Score 0 0  Altered sleeping - 0  Tired, decreased energy - 3  Change in appetite - 3  Feeling bad or failure about yourself  - 2  Trouble concentrating - 0  Moving slowly or fidgety/restless - 0  Suicidal thoughts - 0  PHQ-9 Score - 8      Review of Systems  All other systems reviewed and are negative.      Objective:   Physical Exam   Constitutional: He is oriented to person, place, and time. He appears well-developed and well-nourished. In wheelchair  HENT:  Head: Normocephalic and atraumatic.  Neck: Normal range of motion. Neck supple.  Cardiovascular: Normal rate and regular rhythm.  Pulmonary/Chest: Effort normal and breath sounds normal.  Continuous Oxygen at 3 liters nasal cannula continues Musculoskeletal: He exhibits 1+ edema.  Normal Muscle Bulk and Muscle Testing Reveals: Upper Extremities: Full ROM and Muscle Strength 5/5 Back without spinal or paraspinal tenderness Lower Extremities: Right Full ROM and Muscle strength 5/5 Left BKA with persistent flexion contracture -10 degrees.  Neurological: He is alert and oriented to person, place, and time.  Skin: Skin is warm and dry. He has breakdown on the right ear auricle. He has some lesions on the head from scratching.  Psychiatric: He is pleasant and cooperative    Assessment & Plan:  1. Left BKA/ Phantom Pain :  -Dc oxycontin -Change to oxycodone10mg  ir one q6 prn. Wean topamax to off---consider trileptal  trial -needs increased daily exercise and infusion of leisure, social activities to help mood and pain.  2. 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  3. Hx of spinal stimulator   4. COPD: On Continuous Oxygen Therapy @ 3 liters Nasal Cannula per Pulmonology  5. Reactive depression: will reduce cymbalta back to  30mg   20 minutes of face to face patient care time was spent during this visit. All questions were encouraged and answered.  F/U in 2 months

## 2015-02-22 NOTE — Patient Instructions (Signed)
IN ONE WEEK STOP TOPAMAX

## 2015-03-01 ENCOUNTER — Ambulatory Visit: Payer: Medicare Other | Admitting: Physical Medicine & Rehabilitation

## 2015-03-24 ENCOUNTER — Encounter: Payer: Medicare Other | Attending: Physical Medicine & Rehabilitation | Admitting: Registered Nurse

## 2015-03-24 ENCOUNTER — Encounter: Payer: Self-pay | Admitting: Registered Nurse

## 2015-03-24 VITALS — BP 169/69 | HR 74

## 2015-03-24 DIAGNOSIS — S88112S Complete traumatic amputation at level between knee and ankle, left lower leg, sequela: Secondary | ICD-10-CM | POA: Insufficient documentation

## 2015-03-24 DIAGNOSIS — L409 Psoriasis, unspecified: Secondary | ICD-10-CM | POA: Diagnosis not present

## 2015-03-24 DIAGNOSIS — Z79899 Other long term (current) drug therapy: Secondary | ICD-10-CM | POA: Insufficient documentation

## 2015-03-24 DIAGNOSIS — Z5181 Encounter for therapeutic drug level monitoring: Secondary | ICD-10-CM

## 2015-03-24 DIAGNOSIS — G894 Chronic pain syndrome: Secondary | ICD-10-CM | POA: Insufficient documentation

## 2015-03-24 DIAGNOSIS — E1151 Type 2 diabetes mellitus with diabetic peripheral angiopathy without gangrene: Secondary | ICD-10-CM | POA: Insufficient documentation

## 2015-03-24 DIAGNOSIS — G546 Phantom limb syndrome with pain: Secondary | ICD-10-CM

## 2015-03-24 DIAGNOSIS — R5381 Other malaise: Secondary | ICD-10-CM | POA: Diagnosis present

## 2015-03-24 MED ORDER — OXYCODONE HCL 10 MG PO TABS: 10.0000 mg | ORAL_TABLET | Freq: Four times a day (QID) | ORAL | Status: DC | PRN

## 2015-03-24 MED ORDER — OXYCODONE HCL ER 15 MG PO T12A: 15.0000 mg | EXTENDED_RELEASE_TABLET | Freq: Two times a day (BID) | ORAL | Status: DC

## 2015-03-24 MED ORDER — OXYCODONE HCL 10 MG PO TABS: 10.0000 mg | ORAL_TABLET | Freq: Three times a day (TID) | ORAL | Status: DC | PRN

## 2015-03-24 MED ORDER — OXYCODONE HCL ER 15 MG PO T12A
15.0000 mg | EXTENDED_RELEASE_TABLET | Freq: Two times a day (BID) | ORAL | Status: DC
Start: 2015-03-24 — End: 2015-05-25

## 2015-03-24 MED ORDER — OXCARBAZEPINE 150 MG PO TABS: 150.0000 mg | ORAL_TABLET | Freq: Two times a day (BID) | ORAL | Status: DC

## 2015-03-24 NOTE — Progress Notes (Signed)
Subjective:    Patient ID: Jeremy [name], male    DOB: Feb 01, 1951, 64 y.o.   MRN: [id number]  HPI: [name] is a 64 year old male who returns for follow up for chronic pain and medication refill. He states  his pain has intensified since his Oxycontin was discontinued last month. He states his pain is located in his lower back and left stump. He rates his pain 3. He's not following his usual exercise regime due to pain.  Was in contact with Dr. Naaman Plummer we will resume the Oxycontin and prescribe the trileptal for neuropathic pain/ Phantom Pain.  Wife in room all questions answered.  Jeremy [name] was given two months of prescription only/ computer issues.   Pain Inventory Average Pain 7 Pain Right Now 3 My pain is sharp, stabbing and aching  In the last 24 hours, has pain interfered with the following? General activity 5 Relation with others 0 Enjoyment of life 6 What TIME of day is your pain at its worst? Morning and Night Sleep (in general) Fair  Pain is worse with: inactivity Pain improves with: medication Relief from Meds: 3  Mobility walk with assistance use a walker ability to climb steps?  no do you drive?  yes use a wheelchair Do you have any goals in this area?  yes  Function disabled: date disabled 01/2006 retired I need assistance with the following:  bathing  Neuro/Psych No problems in this area  Prior Studies Any changes since last visit?  no  Physicians involved in your care Any changes since last visit?  no   Family History  Problem Relation Age of Onset  . Heart disease Brother    Social History   Social History  . Marital Status: Married    Spouse Name: N/A  . Number of Children: N/A  . Years of Education: N/A   Social History Main Topics  . Smoking status: Former Smoker -- 0.50 packs/day for 6 years    Types: Cigarettes    Quit date: 12/03/1990  . Smokeless tobacco: Never Used  . Alcohol Use: 0.0 oz/week    0 Cans of beer per week     Comment: rare  . Drug Use: Yes  . Sexual Activity: Not Currently   Other Topics Concern  . None   Social History Narrative   Past Surgical History  Procedure Laterality Date  . Arm surgery    . Vein ligation and stripping    . Tummy tuck    . Stomach stapling    . Skin grafts    . I&d extremity  03/13/2011    Procedure: IRRIGATION AND DEBRIDEMENT EXTREMITY;  Surgeon: [name], MD;  Location: [place];  Service: Orthopedics;  Laterality: Left;  . Skin split graft  03/13/2012    Procedure: SKIN GRAFT SPLIT THICKNESS;  Surgeon: [name], MD;  Location: [place];  Service: Orthopedics;  Laterality: Left;  Excisional Debridement Left Leg, Split Thickness Skin Graft, Wound VAC  . Back stimulator    . Apligraft placement Left 07/31/2012    Procedure: Apply Skin Graft;  Surgeon: [name], MD;  Location: [place];  Service: Orthopedics;  Laterality: Left;  . Application of wound vac Left 07/31/2012    Procedure: APPLICATION OF WOUND VAC;  Surgeon: [name], MD;  Location: [place];  Service: Orthopedics;  Laterality: Left;  . Cardiac catheterization      Hx: of " around 1985 at [facility]"  .  Cyst excision      Hx: of  . I&d extremity Left 11/06/2012    Procedure: IRRIGATION AND DEBRIDEMENT EXTREMITY-Left lower ;  Surgeon: [name], MD;  Location: [place];  Service: Orthopedics;  Laterality: Left;  Left Leg Irrigation and Debridement, Place Skin Graft, Wound VAC, [place]  . Amputation Left 11/25/2012    Procedure: AMPUTATION BELOW KNEE;  Surgeon: [name], MD;  Location: [place];  Service: Orthopedics;  Laterality: Left;  Left Below Knee Amputation   Past Medical History  Diagnosis Date  . GERD (gastroesophageal reflux disease)   . Asthma   . Shortness of breath   . Leg pain   . Peripheral vascular disease ([place])   . Sleep apnea     uses bipap, sleep study done in [place] 2 years ago, dr [name]  . Anemia   . Headache(784.0)     sinus headaches  . COPD (chronic  obstructive pulmonary disease) (HCC)     3L continuous  . Peripheral neuropathy ([place])   . Cancer ([place])     hx basal cell skin cancer  . Psoriasis   . DVT (deep venous thrombosis) (HCC)     LLE DVT '80's  . CHF (congestive heart failure) ([place])     does not see a cardiologist  . Diabetes mellitus     not on medication   BP 169/69 mmHg  Pulse 74  SpO2 97%  Opioid Risk Score:   Fall Risk Score:  `1  Depression screen PHQ 2/9  Depression screen [facility] 2/9 12/30/2014 10/28/2014  Decreased Interest 0 0  Down, Depressed, Hopeless 0 0  PHQ - 2 Score 0 0  Altered sleeping - 0  Tired, decreased energy - 3  Change in appetite - 3  Feeling bad or failure about yourself  - 2  Trouble concentrating - 0  Moving slowly or fidgety/restless - 0  Suicidal thoughts - 0  PHQ-9 Score - 8     Review of Systems  Constitutional: Positive for unexpected weight change.  Respiratory: Positive for apnea.   All other systems reviewed and are negative.      Objective:   Physical Exam  Constitutional: He is oriented to person, place, and time. He appears well-developed and well-nourished.  HENT:  Head: Normocephalic and atraumatic.  Neck: Normal range of motion. Neck supple.  Pulmonary/Chest: Effort normal and breath sounds normal.  Continuous Oxygen at 3 liters nasal cannula  Musculoskeletal:  Normal Muscle Bulk and Muscle Testing Reveals: Upper Extremities: Full ROM and Muscle Strength 5/5 Lumbar Paraspinal Tenderness: L-3- L-5 Lower Extremities: Right: Full ROM and Muscle Strength 5/5 Left: Left BKA Arrived in wheelchair  Neurological: He is alert and oriented to person, place, and time.  Skin: Skin is warm and dry.  Psychiatric: He has a normal mood and affect.  Nursing note and vitals reviewed.         Assessment & Plan:  1. Left BKA/ [place] Pain : RX: Oxycontin 15 mg one tablet every 12 hours #60 and Refilled: oxycodone 10 mg one tablet every 8 hours as needed # 90. Second  script's given.  RX: Trileptal 150 mg one tablet BID 2. 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  3. Hx of spinal stimulator  4. COPD: On Continuous Oxygen Therapy @ 3 liters Nasal Cannula. Pulmonology Following   20 minutes of face to face patient care time was spent during this visit. All  questions were encouraged and answered.   F/U in 2 months

## 2015-04-12 ENCOUNTER — Other Ambulatory Visit: Payer: Self-pay | Admitting: Physical Medicine & Rehabilitation

## 2015-05-25 ENCOUNTER — Encounter: Payer: Medicare Other | Attending: Physical Medicine & Rehabilitation | Admitting: Registered Nurse

## 2015-05-25 ENCOUNTER — Encounter: Payer: Self-pay | Admitting: Registered Nurse

## 2015-05-25 VITALS — BP 140/61 | HR 63 | Resp 14

## 2015-05-25 DIAGNOSIS — Z5181 Encounter for therapeutic drug level monitoring: Secondary | ICD-10-CM

## 2015-05-25 DIAGNOSIS — M545 Low back pain, unspecified: Secondary | ICD-10-CM

## 2015-05-25 DIAGNOSIS — S88112S Complete traumatic amputation at level between knee and ankle, left lower leg, sequela: Secondary | ICD-10-CM

## 2015-05-25 DIAGNOSIS — L409 Psoriasis, unspecified: Secondary | ICD-10-CM

## 2015-05-25 DIAGNOSIS — Z79899 Other long term (current) drug therapy: Secondary | ICD-10-CM

## 2015-05-25 DIAGNOSIS — G546 Phantom limb syndrome with pain: Secondary | ICD-10-CM

## 2015-05-25 DIAGNOSIS — E1151 Type 2 diabetes mellitus with diabetic peripheral angiopathy without gangrene: Secondary | ICD-10-CM | POA: Diagnosis present

## 2015-05-25 DIAGNOSIS — R5381 Other malaise: Secondary | ICD-10-CM | POA: Diagnosis present

## 2015-05-25 DIAGNOSIS — G894 Chronic pain syndrome: Secondary | ICD-10-CM

## 2015-05-25 MED ORDER — OXYCODONE HCL ER 15 MG PO T12A: 15.0000 mg | EXTENDED_RELEASE_TABLET | Freq: Two times a day (BID) | ORAL | Status: DC

## 2015-05-25 MED ORDER — OXCARBAZEPINE 150 MG PO TABS: 150.0000 mg | ORAL_TABLET | Freq: Two times a day (BID) | ORAL | Status: DC

## 2015-05-25 MED ORDER — OXYCODONE HCL 10 MG PO TABS: 10.0000 mg | ORAL_TABLET | Freq: Three times a day (TID) | ORAL | Status: DC | PRN

## 2015-05-25 NOTE — Progress Notes (Signed)
Subjective:    Patient ID: Jeremy [name], male    DOB: 07/27/1950, 65 y.o.   MRN: [id number]  HPI: Jeremy [name] is a 65 year old male who returns for follow up for chronic pain and medication refill. He states his pain is located in his lower back and left stump. He rates his pain 2. He's not following any exercise regime, living sedentary lifestyle. Wife states he's in the bed 99.9%. Mr. Debaere was encouraged to increase his activity as tolerated. He verbalizes understanding.  Wife in room all questions answered.  Pain Inventory Average Pain 4 Pain Right Now 2 My pain is sharp, stabbing, tingling and aching  In the last 24 hours, has pain interfered with the following? General activity 4 Relation with others 4 Enjoyment of life 4 What TIME of day is your pain at its worst? morning Sleep (in general) Poor  Pain is worse with: bending and inactivity Pain improves with: medication Relief from Meds: 9  Mobility use a walker how many minutes can you walk? 1-2 ability to climb steps?  no do you drive?  yes use a wheelchair Do you have any goals in this area?  no  Function retired Do you have any goals in this area?  no  Neuro/Psych tingling  Prior Studies Any changes since last visit?  no  Physicians involved in your care Any changes since last visit?  no   Family History  Problem Relation Age of Onset  . Heart disease Brother    Social History   Social History  . Marital Status: Married    Spouse Name: N/A  . Number of Children: N/A  . Years of Education: N/A   Social History Main Topics  . Smoking status: Former Smoker -- 0.50 packs/day for 6 years    Types: Cigarettes    Quit date: 12/03/1990  . Smokeless tobacco: Never Used  . Alcohol Use: 0.0 oz/week    0 Cans of beer per week     Comment: rare  . Drug Use: Yes  . Sexual Activity: Not Currently   Other Topics Concern  . None   Social History Narrative   Past Surgical History  Procedure  Laterality Date  . Arm surgery    . Vein ligation and stripping    . Tummy tuck    . Stomach stapling    . Skin grafts    . I&d extremity  03/13/2011    Procedure: IRRIGATION AND DEBRIDEMENT EXTREMITY;  Surgeon: [name], MD;  Location: [place];  Service: Orthopedics;  Laterality: Left;  . Skin split graft  03/13/2012    Procedure: SKIN GRAFT SPLIT THICKNESS;  Surgeon: [name], MD;  Location: [place];  Service: Orthopedics;  Laterality: Left;  Excisional Debridement Left Leg, Split Thickness Skin Graft, Wound VAC  . Back stimulator    . Apligraft placement Left 07/31/2012    Procedure: Apply Skin Graft;  Surgeon: [name], MD;  Location: [place];  Service: Orthopedics;  Laterality: Left;  . Application of wound vac Left 07/31/2012    Procedure: APPLICATION OF WOUND VAC;  Surgeon: [name], MD;  Location: [place];  Service: Orthopedics;  Laterality: Left;  . Cardiac catheterization      Hx: of " around 1985 at [facility]"  . Cyst excision      Hx: of  . I&d extremity Left 11/06/2012    Procedure: IRRIGATION AND DEBRIDEMENT EXTREMITY-Left lower ;  Surgeon: Beverely Low  [name], MD;  Location: [place];  Service: Orthopedics;  Laterality: Left;  Left Leg Irrigation and Debridement, Place Skin Graft, Wound VAC, [place]  . Amputation Left 11/25/2012    Procedure: AMPUTATION BELOW KNEE;  Surgeon: [name], MD;  Location: [place];  Service: Orthopedics;  Laterality: Left;  Left Below Knee Amputation   Past Medical History  Diagnosis Date  . GERD (gastroesophageal reflux disease)   . Asthma   . Shortness of breath   . Leg pain   . Peripheral vascular disease ([place])   . Sleep apnea     uses bipap, sleep study done in [place] 2 years ago, dr [name]  . Anemia   . Headache(784.0)     sinus headaches  . COPD (chronic obstructive pulmonary disease) (HCC)     3L continuous  . Peripheral neuropathy ([place])   . Cancer ([place])     hx basal cell skin cancer  . Psoriasis   . DVT (deep venous  thrombosis) (HCC)     LLE DVT '80's  . CHF (congestive heart failure) ([place])     does not see a cardiologist  . Diabetes mellitus     not on medication   BP 140/61 mmHg  Pulse 63  Resp 14  SpO2 93%  Opioid Risk Score:   Fall Risk Score:  `1  Depression screen PHQ 2/9  Depression screen [facility] 2/9 12/30/2014 10/28/2014  Decreased Interest 0 0  Down, Depressed, Hopeless 0 0  PHQ - 2 Score 0 0  Altered sleeping - 0  Tired, decreased energy - 3  Change in appetite - 3  Feeling bad or failure about yourself  - 2  Trouble concentrating - 0  Moving slowly or fidgety/restless - 0  Suicidal thoughts - 0  PHQ-9 Score - 8     Review of Systems  Respiratory: Positive for cough and shortness of breath.   Neurological:       Tingling   All other systems reviewed and are negative.      Objective:   Physical Exam  Constitutional: He is oriented to person, place, and time. He appears well-developed and well-nourished.  HENT:  Head: Normocephalic and atraumatic.  Neck: Normal range of motion. Neck supple.  Cardiovascular: Normal rate and regular rhythm.   Pulmonary/Chest: Effort normal and breath sounds normal.  Continuous Oxygen at 3 liters nasal cannula  Musculoskeletal:  Normal Muscle Bulk and Muscle Testing Reveals: Upper Extremities: Full ROM and Muscle Strength 5/5 Back without spinal or paraspinal tenderness Lower Extremities: Left: BKA Right: Full ROM and Muscle Strength 5/5 Right Lower Extremity with Velcro Wraps  Arrived in wheelchair  Neurological: He is alert and oriented to person, place, and time.  Skin: Skin is warm and dry.  Psychiatric: He has a normal mood and affect.  Nursing note and vitals reviewed.         Assessment & Plan:  1. Left BKA/ [place] Pain : Refilled: Oxycontin 15 mg one tablet every 12 hours #60 and Refilled: oxycodone 10 mg one tablet every 8 hours as needed # 90. Second script's given for the following month.  Continue: Trileptal 150  mg one tablet BID 2. 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  3. Hx of spinal stimulator  4. COPD: On Continuous Oxygen Therapy @ 3 liters Nasal Cannula. Pulmonology Following   20 minutes of face to face patient care time was spent during this visit.  All questions were encouraged and answered.   F/U in 2 months

## 2015-06-01 LAB — TOXASSURE SELECT,+ANTIDEPR,UR: PDF: 0

## 2015-06-01 NOTE — Progress Notes (Signed)
Urine drug screen for this encounter is consistent for prescribed medication 

## 2015-06-12 ENCOUNTER — Other Ambulatory Visit: Payer: Self-pay | Admitting: Registered Nurse

## 2015-06-12 ENCOUNTER — Other Ambulatory Visit: Payer: Self-pay | Admitting: Physical Medicine & Rehabilitation

## 2015-06-12 NOTE — Telephone Encounter (Signed)
Please advise on refill. I do not see documentation to continue med.

## 2015-06-19 ENCOUNTER — Other Ambulatory Visit: Payer: Self-pay | Admitting: Registered Nurse

## 2015-07-19 ENCOUNTER — Encounter: Payer: Self-pay | Admitting: Registered Nurse

## 2015-07-19 ENCOUNTER — Encounter: Payer: Medicare Other | Attending: Physical Medicine & Rehabilitation | Admitting: Registered Nurse

## 2015-07-19 VITALS — BP 154/79 | HR 69 | Resp 14

## 2015-07-19 DIAGNOSIS — R5381 Other malaise: Secondary | ICD-10-CM | POA: Diagnosis present

## 2015-07-19 DIAGNOSIS — E1151 Type 2 diabetes mellitus with diabetic peripheral angiopathy without gangrene: Secondary | ICD-10-CM | POA: Insufficient documentation

## 2015-07-19 DIAGNOSIS — G546 Phantom limb syndrome with pain: Secondary | ICD-10-CM | POA: Diagnosis not present

## 2015-07-19 DIAGNOSIS — S88112S Complete traumatic amputation at level between knee and ankle, left lower leg, sequela: Secondary | ICD-10-CM | POA: Diagnosis present

## 2015-07-19 DIAGNOSIS — G894 Chronic pain syndrome: Secondary | ICD-10-CM

## 2015-07-19 DIAGNOSIS — M545 Low back pain, unspecified: Secondary | ICD-10-CM

## 2015-07-19 DIAGNOSIS — Z79899 Other long term (current) drug therapy: Secondary | ICD-10-CM

## 2015-07-19 DIAGNOSIS — Z5181 Encounter for therapeutic drug level monitoring: Secondary | ICD-10-CM | POA: Diagnosis present

## 2015-07-19 MED ORDER — OXYCODONE HCL ER 15 MG PO T12A: 15.0000 mg | EXTENDED_RELEASE_TABLET | Freq: Two times a day (BID) | ORAL | Status: DC

## 2015-07-19 MED ORDER — OXYCODONE HCL 10 MG PO TABS: 10.0000 mg | ORAL_TABLET | Freq: Three times a day (TID) | ORAL | Status: DC | PRN

## 2015-07-19 MED ORDER — OXYCODONE HCL 10 MG PO TABS: 10.0000 mg | ORAL_TABLET | Freq: Four times a day (QID) | ORAL | Status: DC | PRN

## 2015-07-19 MED ORDER — OXYCODONE HCL ER 15 MG PO T12A
15.0000 mg | EXTENDED_RELEASE_TABLET | Freq: Two times a day (BID) | ORAL | Status: DC
Start: 2015-07-19 — End: 2015-07-19

## 2015-07-19 NOTE — Progress Notes (Signed)
Subjective:    Patient ID: Jeremy [name], male    DOB: 08-Dec-1950, 65 y.o.   MRN: [id number]  HPI: [name] is a 65 year old male who returns for follow up for chronic pain and medication refill. He states his pain is located in his lower back and left stump. Also stated  " in the last few weeks he was having excruciating groin pain from skin breakdown, he was taking more of his analgesics than prescribe. He was educated on the policy and instructed to call the office if his pain intensifies. He verbalizes understanding. We will allow him to take his oxycodone every 6 hours for one month. We will resume oxycodone every 8 hours for May. He verbalizes understanding. He rates his pain 3. He's not following a current exercise regime, he walks to the bathroom with his walker in his home. He's living sedentary lifestyle. Encouraged to increase activity he verbalizes understanding. Wife in room all questions answered.  Pain Inventory Average Pain 5 Pain Right Now 3 My pain is intermittent, sharp, stabbing and aching  In the last 24 hours, has pain interfered with the following? General activity 2 Relation with others 2 Enjoyment of life 2 What TIME of day is your pain at its worst? morning Sleep (in general) Fair  Pain is worse with: inactivity Pain improves with: medication and TENS Relief from Meds: 9  Mobility use a walker how many minutes can you walk? 1-2 do you drive?  yes use a wheelchair Do you have any goals in this area?  yes  Function retired I need assistance with the following:  bathing Do you have any goals in this area?  no  Neuro/Psych No problems in this area  Prior Studies Any changes since last visit?  no  Physicians involved in your care Any changes since last visit?  no   Family History  Problem Relation Age of Onset  . Heart disease Brother    Social History   Social History  . Marital Status: Married    Spouse Name: N/A  . Number of  Children: N/A  . Years of Education: N/A   Social History Main Topics  . Smoking status: Former Smoker -- 0.50 packs/day for 6 years    Types: Cigarettes    Quit date: 12/03/1990  . Smokeless tobacco: Never Used  . Alcohol Use: 0.0 oz/week    0 Cans of beer per week     Comment: rare  . Drug Use: Yes  . Sexual Activity: Not Currently   Other Topics Concern  . None   Social History Narrative   Past Surgical History  Procedure Laterality Date  . Arm surgery    . Vein ligation and stripping    . Tummy tuck    . Stomach stapling    . Skin grafts    . I&d extremity  03/13/2011    Procedure: IRRIGATION AND DEBRIDEMENT EXTREMITY;  Surgeon: [name], MD;  Location: [place];  Service: Orthopedics;  Laterality: Left;  . Skin split graft  03/13/2012    Procedure: SKIN GRAFT SPLIT THICKNESS;  Surgeon: [name], MD;  Location: [place];  Service: Orthopedics;  Laterality: Left;  Excisional Debridement Left Leg, Split Thickness Skin Graft, Wound VAC  . Back stimulator    . Apligraft placement Left 07/31/2012    Procedure: Apply Skin Graft;  Surgeon: [name], MD;  Location: [place];  Service: Orthopedics;  Laterality: Left;  .  Application of wound vac Left 07/31/2012    Procedure: APPLICATION OF WOUND VAC;  Surgeon: [name], MD;  Location: [place];  Service: Orthopedics;  Laterality: Left;  . Cardiac catheterization      Hx: of " around 1985 at [facility]"  . Cyst excision      Hx: of  . I&d extremity Left 11/06/2012    Procedure: IRRIGATION AND DEBRIDEMENT EXTREMITY-Left lower ;  Surgeon: [name], MD;  Location: [place];  Service: Orthopedics;  Laterality: Left;  Left Leg Irrigation and Debridement, Place Skin Graft, Wound VAC, [place]  . Amputation Left 11/25/2012    Procedure: AMPUTATION BELOW KNEE;  Surgeon: [name], MD;  Location: [place];  Service: Orthopedics;  Laterality: Left;  Left Below Knee Amputation   Past Medical History  Diagnosis Date  . GERD  (gastroesophageal reflux disease)   . Asthma   . Shortness of breath   . Leg pain   . Peripheral vascular disease ([place])   . Sleep apnea     uses bipap, sleep study done in [place] 2 years ago, dr [name]  . Anemia   . Headache(784.0)     sinus headaches  . COPD (chronic obstructive pulmonary disease) (HCC)     3L continuous  . Peripheral neuropathy ([place])   . Cancer ([place])     hx basal cell skin cancer  . Psoriasis   . DVT (deep venous thrombosis) (HCC)     LLE DVT '80's  . CHF (congestive heart failure) ([place])     does not see a cardiologist  . Diabetes mellitus     not on medication   BP 154/79 mmHg  Pulse 69  Resp 14  SpO2 98%  Opioid Risk Score:   Fall Risk Score:  `1  Depression screen PHQ 2/9  Depression screen [facility] 2/9 07/19/2015 12/30/2014 10/28/2014  Decreased Interest 2 0 0  Down, Depressed, Hopeless 1 0 0  PHQ - 2 Score 3 0 0  Altered sleeping 2 - 0  Tired, decreased energy 3 - 3  Change in appetite 3 - 3  Feeling bad or failure about yourself  3 - 2  Trouble concentrating 0 - 0  Moving slowly or fidgety/restless 0 - 0  Suicidal thoughts 1 - 0  PHQ-9 Score 15 - 8  Difficult doing work/chores Somewhat difficult - -     Review of Systems  All other systems reviewed and are negative.      Objective:   Physical Exam  Constitutional: He is oriented to person, place, and time. He appears well-developed and well-nourished.  HENT:  Head: Normocephalic and atraumatic.  Neck: Normal range of motion. Neck supple.  Cardiovascular: Normal rate and regular rhythm.   Pulmonary/Chest: Effort normal and breath sounds normal.  Continuous Oxygen 3 liters nasal cannula   Musculoskeletal: He exhibits edema.  Normal Muscle Bulk and Muscle Testing Reveals: Upper Extremities: Full ROM and Muscle Strength 5/5 Back without spinal tenderness Lower Extremities: Right: Full ROM and Muscle Strength 5/5. Velcro Wraps Intact Left: BKA  Arrived in wheelchair     Neurological: He is alert and oriented to person, place, and time.  Skin: Skin is warm and dry.  Psychiatric: He has a normal mood and affect.  Nursing note and vitals reviewed.         Assessment & Plan:  1. Left BKA/ [place] Pain : Refilled: Oxycontin 15 mg one tablet every 12 hours #60 and Refilled: oxycodone 10 mg  one tablet every 6 hours as needed # 120 for one month. Second script's given for the following month.  Continue: Trileptal 150 mg one tablet BID 2. 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  3. Hx of spinal stimulator  4. COPD: On Continuous Oxygen Therapy @ 3 liters Nasal Cannula. Pulmonology Following   20 minutes of face to face patient care time was spent during this visit. All questions were encouraged and answered.   F/U in 2 months

## 2015-08-06 ENCOUNTER — Other Ambulatory Visit: Payer: Self-pay | Admitting: Physical Medicine & Rehabilitation

## 2015-09-12 ENCOUNTER — Encounter: Payer: Medicare Other | Admitting: Registered Nurse

## 2015-09-19 ENCOUNTER — Encounter: Payer: Self-pay | Admitting: Registered Nurse

## 2015-09-19 ENCOUNTER — Other Ambulatory Visit: Payer: Self-pay | Admitting: Registered Nurse

## 2015-09-19 ENCOUNTER — Encounter: Payer: Medicare Other | Attending: Physical Medicine & Rehabilitation | Admitting: Registered Nurse

## 2015-09-19 VITALS — BP 102/63 | HR 63 | Resp 16

## 2015-09-19 DIAGNOSIS — M545 Low back pain, unspecified: Secondary | ICD-10-CM

## 2015-09-19 DIAGNOSIS — G546 Phantom limb syndrome with pain: Secondary | ICD-10-CM | POA: Diagnosis not present

## 2015-09-19 DIAGNOSIS — E1151 Type 2 diabetes mellitus with diabetic peripheral angiopathy without gangrene: Secondary | ICD-10-CM | POA: Insufficient documentation

## 2015-09-19 DIAGNOSIS — Z79899 Other long term (current) drug therapy: Secondary | ICD-10-CM | POA: Diagnosis present

## 2015-09-19 DIAGNOSIS — Z5181 Encounter for therapeutic drug level monitoring: Secondary | ICD-10-CM | POA: Diagnosis not present

## 2015-09-19 DIAGNOSIS — G894 Chronic pain syndrome: Secondary | ICD-10-CM

## 2015-09-19 DIAGNOSIS — S88112S Complete traumatic amputation at level between knee and ankle, left lower leg, sequela: Secondary | ICD-10-CM | POA: Insufficient documentation

## 2015-09-19 DIAGNOSIS — R5381 Other malaise: Secondary | ICD-10-CM | POA: Diagnosis present

## 2015-09-19 MED ORDER — OXYCODONE HCL ER 15 MG PO T12A: 15.0000 mg | EXTENDED_RELEASE_TABLET | Freq: Two times a day (BID) | ORAL | Status: DC

## 2015-09-19 MED ORDER — OXYCODONE HCL 10 MG PO TABS: 10.0000 mg | ORAL_TABLET | Freq: Three times a day (TID) | ORAL | Status: DC | PRN

## 2015-09-19 NOTE — Progress Notes (Signed)
Subjective:    Patient ID: Jeremy [name], male    DOB: Nov 09, 1950, 65 y.o.   MRN: [id number]  HPI: [name] is a 65 year old male who returns for follow up for chronic pain and medication refill. He states his pain is located in his lower back and left stump  ( phantom pain). He verbalizes understanding. He rates his pain 0 at this time. His current exercise regime is performing stretching exercises and walking to the bathroom with his walker in his home. Encouraged to continue to increase activity he verbalizes understanding. Wife in room all questions answered.  Pain Inventory Average Pain 5 Pain Right Now 0 My pain is sharp, stabbing and aching  In the last 24 hours, has pain interfered with the following? General activity 4 Relation with others 1 Enjoyment of life 4 What TIME of day is your pain at its worst? morning Sleep (in general) Fair  Pain is worse with: inactivity Pain improves with: medication Relief from Meds: 10  Mobility use a walker how many minutes can you walk? 1-2 ability to climb steps?  no do you drive?  yes use a wheelchair needs help with transfers Do you have any goals in this area?  no  Function disabled: date disabled 05-2006 retired Do you have any goals in this area?  no  Neuro/Psych dizziness  Prior Studies Any changes since last visit?  yes CT/MRI  Physicians involved in your care Any changes since last visit?  yes Dr Manuela Neptune   Family History  Problem Relation Age of Onset  . Heart disease Brother    Social History   Social History  . Marital Status: Married    Spouse Name: N/A  . Number of Children: N/A  . Years of Education: N/A   Social History Main Topics  . Smoking status: Former Smoker -- 0.50 packs/day for 6 years    Types: Cigarettes    Quit date: 12/03/1990  . Smokeless tobacco: Never Used  . Alcohol Use: 0.0 oz/week    0 Cans of beer per week     Comment: rare  . Drug Use: Yes  . Sexual Activity:  Not Currently   Other Topics Concern  . None   Social History Narrative   Past Surgical History  Procedure Laterality Date  . Arm surgery    . Vein ligation and stripping    . Tummy tuck    . Stomach stapling    . Skin grafts    . I&d extremity  03/13/2011    Procedure: IRRIGATION AND DEBRIDEMENT EXTREMITY;  Surgeon: [name], MD;  Location: [place];  Service: Orthopedics;  Laterality: Left;  . Skin split graft  03/13/2012    Procedure: SKIN GRAFT SPLIT THICKNESS;  Surgeon: [name], MD;  Location: [place];  Service: Orthopedics;  Laterality: Left;  Excisional Debridement Left Leg, Split Thickness Skin Graft, Wound VAC  . Back stimulator    . Apligraft placement Left 07/31/2012    Procedure: Apply Skin Graft;  Surgeon: [name], MD;  Location: [place];  Service: Orthopedics;  Laterality: Left;  . Application of wound vac Left 07/31/2012    Procedure: APPLICATION OF WOUND VAC;  Surgeon: [name], MD;  Location: [place];  Service: Orthopedics;  Laterality: Left;  . Cardiac catheterization      Hx: of " around 1985 at [facility]"  . Cyst excision      Hx: of  .  I&d extremity Left 11/06/2012    Procedure: IRRIGATION AND DEBRIDEMENT EXTREMITY-Left lower ;  Surgeon: [name], MD;  Location: [place];  Service: Orthopedics;  Laterality: Left;  Left Leg Irrigation and Debridement, Place Skin Graft, Wound VAC, [place]  . Amputation Left 11/25/2012    Procedure: AMPUTATION BELOW KNEE;  Surgeon: [name], MD;  Location: [place];  Service: Orthopedics;  Laterality: Left;  Left Below Knee Amputation   Past Medical History  Diagnosis Date  . GERD (gastroesophageal reflux disease)   . Asthma   . Shortness of breath   . Leg pain   . Peripheral vascular disease ([place])   . Sleep apnea     uses bipap, sleep study done in [place] 2 years ago, dr [name]  . Anemia   . Headache(784.0)     sinus headaches  . COPD (chronic obstructive pulmonary disease) (HCC)     3L continuous  .  Peripheral neuropathy ([place])   . Cancer ([place])     hx basal cell skin cancer  . Psoriasis   . DVT (deep venous thrombosis) (HCC)     LLE DVT '80's  . CHF (congestive heart failure) ([place])     does not see a cardiologist  . Diabetes mellitus     not on medication   BP 102/63 mmHg  Pulse 63  Resp 16  SpO2 97%  Opioid Risk Score:   Fall Risk Score:  `1  Depression screen PHQ 2/9  Depression screen [facility] 2/9 09/19/2015 07/19/2015 12/30/2014 10/28/2014  Decreased Interest 0 2 0 0  Down, Depressed, Hopeless 0 1 0 0  PHQ - 2 Score 0 3 0 0  Altered sleeping - 2 - 0  Tired, decreased energy - 3 - 3  Change in appetite - 3 - 3  Feeling bad or failure about yourself  - 3 - 2  Trouble concentrating - 0 - 0  Moving slowly or fidgety/restless - 0 - 0  Suicidal thoughts - 1 - 0  PHQ-9 Score - 15 - 8  Difficult doing work/chores - Somewhat difficult - -      Review of Systems  Constitutional:       High blood sugar  Cardiovascular:       Limb swelling  All other systems reviewed and are negative.      Objective:   Physical Exam  Constitutional: He is oriented to person, place, and time. He appears well-developed and well-nourished.  HENT:  Head: Normocephalic and atraumatic.  Neck: Normal range of motion. Neck supple.  Cardiovascular: Normal rate and regular rhythm.   Pulmonary/Chest: Effort normal and breath sounds normal.  Continuous Oxygen at 3 liters nasal cannula   Musculoskeletal:  Normal Muscle Bulk and Muscle Testing Reveals: Upper Extremities: Full ROM and Muscle Strength 5/5 Back without spinal tenderness Lower Extremities: Right: Full ROM and Muscle Strength 5/5 Left: BKA Arrived in wheelchair  Neurological: He is alert and oriented to person, place, and time.  Skin: Skin is warm and dry.  Psychiatric: He has a normal mood and affect.  Nursing note and vitals reviewed.         Assessment & Plan:  1. Left BKA/ [place] Pain : Refilled: Oxycontin 15 mg one  tablet every 12 hours #60 and Refilled: oxycodone 10 mg one tablet every 8 hours as needed # 90 for one month. Second script's given for the following month.  We will continue the opioid monitoring program, this consists of regular clinic  visits, examinations, urine drug screen, pill counts as well as use of [place] Controlled Substance Reporting System. Continue: Trileptal 150 mg one tablet BID 2. 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  3. Hx of spinal stimulator  4. COPD: On Continuous Oxygen Therapy @ 3 liters Nasal Cannula. Pulmonology Following   20 minutes of face to face patient care time was spent during this visit. All questions were encouraged and answered.   F/U in 2 months

## 2015-09-21 ENCOUNTER — Telehealth: Payer: Self-pay | Admitting: *Deleted

## 2015-09-21 NOTE — Telephone Encounter (Signed)
Returned Jeremy [name] call, he stated yesterday ( 09/20/15) he was having severe left lower quadrant abdominal pain he went to [facility]. He states the pain has subsided and now he has a dull ache. He wanted to know if the pain returned could he take two tablets of his oxycodone, this request was denied. He believes he is trying to pass a kidney stone. He was instructed to go to the ER if pain returns he verbalizes understanding. He has an appointment with his PCP Dr. Deatra Ina on 09/22/15  At 10:45.

## 2015-09-21 NOTE — Telephone Encounter (Signed)
Patient asking for Jeremy [name] to call back, urgent, patient having some medical issues. Possible kidney stones....please call

## 2015-09-22 ENCOUNTER — Telehealth: Payer: Self-pay | Admitting: *Deleted

## 2015-09-22 NOTE — Telephone Encounter (Signed)
Pt's wife called for [place]. She reports that she took the pt to the ER. They are looking for Kidney Stones. do not know anything yet.....FYI

## 2015-09-27 NOTE — Telephone Encounter (Signed)
I return Jeremy [name]  call, Jeremy [name] states he went to [facility] on June 2nd,2017. He was diagnosed with a kidney stone. He was admitted on June 2nd and discharged on June 4th. Dr. Joya Gaskins  ( Urologist) inserted a ureteroscope  and kidney stone was removed and a stent was placed he states.  On 09/26/15 he states he was in excruciating pain, had hematuria and his stent had fallen out. He spoke with the Urologist he states. He admits taking an extra Oxycodone 10 mg yesterday. Today, he states he feels better and he's following his usual medication regime. He has a follow up appointment with urologist on 10/10/15. Instructed to call with any questions or concerns he verbalizes understanding.

## 2015-09-27 NOTE — Telephone Encounter (Signed)
Patient would like for Jeremy [name] to call him, he was discharged from hospital on Sunday 09/24/15 for Kidney Stones.  He has had to increase his pain medication because of how painful it has been.  His urine has been real dark and his stint has also came out on its on yesterday.

## 2015-11-15 ENCOUNTER — Encounter: Payer: Self-pay | Admitting: Physical Medicine & Rehabilitation

## 2015-11-15 ENCOUNTER — Encounter: Payer: Medicare Other | Attending: Physical Medicine & Rehabilitation | Admitting: Physical Medicine & Rehabilitation

## 2015-11-15 VITALS — BP 127/73 | HR 63

## 2015-11-15 DIAGNOSIS — R5381 Other malaise: Secondary | ICD-10-CM | POA: Diagnosis present

## 2015-11-15 DIAGNOSIS — Z79899 Other long term (current) drug therapy: Secondary | ICD-10-CM | POA: Diagnosis present

## 2015-11-15 DIAGNOSIS — S88112S Complete traumatic amputation at level between knee and ankle, left lower leg, sequela: Secondary | ICD-10-CM | POA: Diagnosis present

## 2015-11-15 DIAGNOSIS — G546 Phantom limb syndrome with pain: Secondary | ICD-10-CM

## 2015-11-15 DIAGNOSIS — G894 Chronic pain syndrome: Secondary | ICD-10-CM | POA: Insufficient documentation

## 2015-11-15 DIAGNOSIS — E1151 Type 2 diabetes mellitus with diabetic peripheral angiopathy without gangrene: Secondary | ICD-10-CM | POA: Diagnosis present

## 2015-11-15 DIAGNOSIS — Z5181 Encounter for therapeutic drug level monitoring: Secondary | ICD-10-CM | POA: Insufficient documentation

## 2015-11-15 MED ORDER — OXYCODONE HCL 10 MG PO TABS: 10.0000 mg | ORAL_TABLET | Freq: Three times a day (TID) | ORAL | 0 refills | Status: DC | PRN

## 2015-11-15 MED ORDER — OXYCODONE HCL ER 15 MG PO T12A: 15.0000 mg | EXTENDED_RELEASE_TABLET | Freq: Two times a day (BID) | ORAL | 0 refills | Status: DC

## 2015-11-15 NOTE — Patient Instructions (Signed)
PLEASE CALL ME WITH ANY PROBLEMS OR QUESTIONS ([phone])  

## 2015-11-15 NOTE — Progress Notes (Signed)
Subjective:    Patient ID: Jeremy [name], male    DOB: May 12, 1950, 65 y.o.   MRN: [id number]  HPI   Jeremy [name] is here in follow up of his chronic pain. He's had problems with kidney stones and had to have one extracted a few months ago. He doesn't know what contributed to the stones. His wife states that hes' not drinking enough water.  [name] himself says he's been more active. He's off oxygen at times now. He self-propels his w/c around the home and outside the home at times.  For pain he remains on oxy cr 15mg  and oxycodone 10mg  q8 prn. This seems to control his pain. He uses trileptal for phantom limb pain.     Pain Inventory Average Pain 3 Pain Right Now 1 My pain is intermittent, sharp, stabbing and aching  In the last 24 hours, has pain interfered with the following? General activity 3 Relation with others 2 Enjoyment of life 3 What TIME of day is your pain at its worst? morning Sleep (in general) Poor  Pain is worse with: unsure Pain improves with: medication Relief from Meds: 10  Mobility use a walker ability to climb steps?  no do you drive?  yes use a wheelchair Do you have any goals in this area?  no  Function retired Do you have any goals in this area?  no  Neuro/Psych No problems in this area  Prior Studies Any changes since last visit?  no  Physicians involved in your care Any changes since last visit?  no   Family History  Problem Relation Age of Onset  . Heart disease Brother    Social History   Social History  . Marital status: Married    Spouse name: N/A  . Number of children: N/A  . Years of education: N/A   Social History Main Topics  . Smoking status: Former Smoker    Packs/day: 0.50    Years: 6.00    Types: Cigarettes    Quit date: 12/03/1990  . Smokeless tobacco: Never Used  . Alcohol use 0.0 oz/week     Comment: rare  . Drug use:   . Sexual activity: Not Currently   Other Topics Concern  . None   Social History Narrative  .  None   Past Surgical History:  Procedure Laterality Date  . AMPUTATION Left 11/25/2012   Procedure: AMPUTATION BELOW KNEE;  Surgeon: [name], MD;  Location: [place];  Service: Orthopedics;  Laterality: Left;  Left Below Knee Amputation  . APLIGRAFT PLACEMENT Left 07/31/2012   Procedure: Apply Skin Graft;  Surgeon: [name], MD;  Location: [place];  Service: Orthopedics;  Laterality: Left;  . APPLICATION OF WOUND VAC Left 07/31/2012   Procedure: APPLICATION OF WOUND VAC;  Surgeon: [name], MD;  Location: [place];  Service: Orthopedics;  Laterality: Left;  . arm surgery    . back stimulator    . CARDIAC CATHETERIZATION     Hx: of " around 1985 at [facility]"  . CYST EXCISION     Hx: of  . I&D EXTREMITY  03/13/2011   Procedure: IRRIGATION AND DEBRIDEMENT EXTREMITY;  Surgeon: [name], MD;  Location: [place];  Service: Orthopedics;  Laterality: Left;  . I&D EXTREMITY Left 11/06/2012   Procedure: IRRIGATION AND DEBRIDEMENT EXTREMITY-Left lower ;  Surgeon: [name], MD;  Location: [place];  Service: Orthopedics;  Laterality: Left;  Left Leg Irrigation and Debridement, Place Skin Graft,  Wound VAC, [place]  . skin grafts    . SKIN SPLIT GRAFT  03/13/2012   Procedure: SKIN GRAFT SPLIT THICKNESS;  Surgeon: [name], MD;  Location: [place];  Service: Orthopedics;  Laterality: Left;  Excisional Debridement Left Leg, Split Thickness Skin Graft, Wound VAC  . stomach stapling    . tummy tuck    . VEIN LIGATION AND STRIPPING     Past Medical History:  Diagnosis Date  . Anemia   . Asthma   . Cancer ([place])    hx basal cell skin cancer  . CHF (congestive heart failure) ([place])    does not see a cardiologist  . COPD (chronic obstructive pulmonary disease) ([place])    3L continuous  . Diabetes mellitus    not on medication  . DVT (deep venous thrombosis) ([facility])    LLE DVT '80's  . GERD (gastroesophageal reflux disease)   . Headache(784.0)    sinus headaches  . Leg pain   . Peripheral  neuropathy ([place])   . Peripheral vascular disease ([place])   . Psoriasis   . Shortness of breath   . Sleep apnea    uses bipap, sleep study done in [place] 2 years ago, dr [name]   There were no vitals taken for this visit.  Opioid Risk Score:   Fall Risk Score:  `1  Depression screen PHQ 2/9  Depression screen [facility] 2/9 09/19/2015 07/19/2015 12/30/2014 10/28/2014  Decreased Interest 0 2 0 0  Down, Depressed, Hopeless 0 1 0 0  PHQ - 2 Score 0 3 0 0  Altered sleeping - 2 - 0  Tired, decreased energy - 3 - 3  Change in appetite - 3 - 3  Feeling bad or failure about yourself  - 3 - 2  Trouble concentrating - 0 - 0  Moving slowly or fidgety/restless - 0 - 0  Suicidal thoughts - 1 - 0  PHQ-9 Score - 15 - 8  Difficult doing work/chores - Somewhat difficult - -    Review of Systems  Constitutional: Negative.   HENT: Negative.   Eyes: Negative.   Respiratory: Negative.   Cardiovascular: Negative.   Gastrointestinal: Negative.   Endocrine: Negative.   Genitourinary: Negative.   Musculoskeletal: Negative.   Skin: Negative.   Allergic/Immunologic: Negative.   Neurological: Negative.   Hematological: Negative.   Psychiatric/Behavioral: Negative.        Objective:   Physical Exam  Constitutional: He is oriented to person, place, and time. He appears well-developed and well-nourished. In wheelchair  HENT:  Head: Normocephalic and atraumatic.  Neck: Normal range of motion. Neck supple.  Cardiovascular: Normal rate and regular rhythm.  Pulmonary/Chest: Effort normal and breath sounds normal.  Continuous Oxygen at 3 liters nasal cannula continues Musculoskeletal: He exhibits 1+ edema.  Normal Muscle Bulk and Muscle Testing Reveals: Upper Extremities: Full ROM and Muscle Strength 5/5 Back without spinal or paraspinal tenderness Lower Extremities: Right Full ROM and Muscle strength 5/5 Left BKA with persistent flexion contracture.  Neurological: He is alert and oriented to  person, place, and time.  Skin: Skin is warm and dry. He has breakdown on the right ear auricle. He has some lesions on the head from scratching.  Psychiatric: He is pleasant and cooperative    Assessment & Plan:  1. Left BKA/ Phantom Pain:  -refill  oxycontin 15mg  q12. Second refill for next month - oxycodone10mg  ir one q8 prn.  l -continue daily exercise and infusion of leisure, social  activities to help mood and pain.  -maintain trileptal for phantom limb pain---pt feels it's helped. Need to consider it as a possible source of his kidney stones however. 2. 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  3. Hx of spinal stimulator   4. COPD: On Continuous Oxygen Therapy @ 3 liters Nasal Cannula per Pulmonology  5. Reactive depression:  cymbalta back to 30mg   6. Kidney stones  -see above  -aggressive hydration with water, avoid caffeine   20 minutes of face to face patient care time was spent during this visit. All questions were encouraged and answered.  F/U in 2 months

## 2015-11-23 LAB — TOXASSURE SELECT,+ANTIDEPR,UR: PDF: 0

## 2015-11-24 NOTE — Progress Notes (Signed)
Urine drug screen for this encounter is consistent for prescribed medication 

## 2015-12-18 ENCOUNTER — Other Ambulatory Visit: Payer: Self-pay | Admitting: *Deleted

## 2015-12-18 MED ORDER — AMITRIPTYLINE HCL 25 MG PO TABS: 25.0000 mg | ORAL_TABLET | Freq: Every day | ORAL | 3 refills | Status: DC

## 2015-12-20 ENCOUNTER — Telehealth: Payer: Self-pay | Admitting: Physical Medicine & Rehabilitation

## 2015-12-20 NOTE — Telephone Encounter (Signed)
Contacted patient, pharmacy, and [employer]. I was on the phone for over an hour trying to figure out why [facility] [place] was unable to process pt's Rx.  It was finally pushed through.  The representative from [facility] says the problem runs deeper. Someone from [facility] needs to contact CMS and get Dr. Charm Barges information straight on their database.

## 2015-12-20 NOTE — Telephone Encounter (Signed)
Patient is trying to get his medication refilled at his pharmacy and they are telling him the DEA number is invalid for physician.  Please call patient to get this taken care of.

## 2016-01-04 ENCOUNTER — Encounter: Payer: Self-pay | Admitting: Registered Nurse

## 2016-01-04 ENCOUNTER — Encounter: Payer: Medicare Other | Attending: Physical Medicine & Rehabilitation | Admitting: Registered Nurse

## 2016-01-04 VITALS — BP 148/58 | HR 76 | Resp 16

## 2016-01-04 DIAGNOSIS — Z79899 Other long term (current) drug therapy: Secondary | ICD-10-CM | POA: Diagnosis present

## 2016-01-04 DIAGNOSIS — M545 Low back pain, unspecified: Secondary | ICD-10-CM

## 2016-01-04 DIAGNOSIS — E1151 Type 2 diabetes mellitus with diabetic peripheral angiopathy without gangrene: Secondary | ICD-10-CM | POA: Insufficient documentation

## 2016-01-04 DIAGNOSIS — R5381 Other malaise: Secondary | ICD-10-CM | POA: Diagnosis present

## 2016-01-04 DIAGNOSIS — G894 Chronic pain syndrome: Secondary | ICD-10-CM | POA: Diagnosis present

## 2016-01-04 DIAGNOSIS — Z5181 Encounter for therapeutic drug level monitoring: Secondary | ICD-10-CM

## 2016-01-04 DIAGNOSIS — S88112S Complete traumatic amputation at level between knee and ankle, left lower leg, sequela: Secondary | ICD-10-CM | POA: Diagnosis present

## 2016-01-04 DIAGNOSIS — G546 Phantom limb syndrome with pain: Secondary | ICD-10-CM | POA: Diagnosis not present

## 2016-01-04 MED ORDER — OXYCODONE HCL 10 MG PO TABS: 10.0000 mg | ORAL_TABLET | Freq: Three times a day (TID) | ORAL | 0 refills | Status: DC | PRN

## 2016-01-04 MED ORDER — OXYCODONE HCL ER 15 MG PO T12A: 15.0000 mg | EXTENDED_RELEASE_TABLET | Freq: Two times a day (BID) | ORAL | 0 refills | Status: DC

## 2016-01-04 NOTE — Progress Notes (Signed)
Subjective:    Patient ID: Jeremy [name], male    DOB: 17-Jul-1950, 65 y.o.   MRN: [id number]  HPI:  [name] is a 65 year old male who returns for follow up for chronic pain and medication refill. He states his pain is located in his lower back and left stump  ( phantom pain). He verbalizes understanding. He rates his pain 1 at this time. His current exercise regime is performing stretching exercises and walking to the bathroom with his walker in his home. Encouraged to continue to increase activity he verbalizes understanding.  Wife in room all questions answered.  Pain Inventory Average Pain 3 Pain Right Now 1 My pain is sharp, dull, stabbing and aching  In the last 24 hours, has pain interfered with the following? General activity 2 Relation with others 2 Enjoyment of life 3 What TIME of day is your pain at its worst? morning Sleep (in general) Fair  Pain is worse with: inactivity Pain improves with: medication Relief from Meds: no ans  Mobility use a walker ability to climb steps?  no use a wheelchair  Function retired  Neuro/Psych No problems in this area  Prior Studies Any changes since last visit?  no  Physicians involved in your care Any changes since last visit?  no   Family History  Problem Relation Age of Onset  . Heart disease Brother    Social History   Social History  . Marital status: Married    Spouse name: N/A  . Number of children: N/A  . Years of education: N/A   Social History Main Topics  . Smoking status: Former Smoker    Packs/day: 0.50    Years: 6.00    Types: Cigarettes    Quit date: 12/03/1990  . Smokeless tobacco: Never Used  . Alcohol use 0.0 oz/week     Comment: rare  . Drug use:   . Sexual activity: Not Currently   Other Topics Concern  . None   Social History Narrative  . None   Past Surgical History:  Procedure Laterality Date  . AMPUTATION Left 11/25/2012   Procedure: AMPUTATION BELOW KNEE;  Surgeon:  [name], MD;  Location: [place];  Service: Orthopedics;  Laterality: Left;  Left Below Knee Amputation  . APLIGRAFT PLACEMENT Left 07/31/2012   Procedure: Apply Skin Graft;  Surgeon: [name], MD;  Location: [place];  Service: Orthopedics;  Laterality: Left;  . APPLICATION OF WOUND VAC Left 07/31/2012   Procedure: APPLICATION OF WOUND VAC;  Surgeon: [name], MD;  Location: [place];  Service: Orthopedics;  Laterality: Left;  . arm surgery    . back stimulator    . CARDIAC CATHETERIZATION     Hx: of " around 1985 at [facility]"  . CYST EXCISION     Hx: of  . I&D EXTREMITY  03/13/2011   Procedure: IRRIGATION AND DEBRIDEMENT EXTREMITY;  Surgeon: [name], MD;  Location: [place];  Service: Orthopedics;  Laterality: Left;  . I&D EXTREMITY Left 11/06/2012   Procedure: IRRIGATION AND DEBRIDEMENT EXTREMITY-Left lower ;  Surgeon: [name], MD;  Location: [place];  Service: Orthopedics;  Laterality: Left;  Left Leg Irrigation and Debridement, Place Skin Graft, Wound VAC, [place]  . skin grafts    . SKIN SPLIT GRAFT  03/13/2012   Procedure: SKIN GRAFT SPLIT THICKNESS;  Surgeon: [name], MD;  Location: [place];  Service: Orthopedics;  Laterality: Left;  Excisional  Debridement Left Leg, Split Thickness Skin Graft, Wound VAC  . stomach stapling    . tummy tuck    . VEIN LIGATION AND STRIPPING     Past Medical History:  Diagnosis Date  . Anemia   . Asthma   . Cancer ([place])    hx basal cell skin cancer  . CHF (congestive heart failure) ([place])    does not see a cardiologist  . COPD (chronic obstructive pulmonary disease) ([place])    3L continuous  . Diabetes mellitus    not on medication  . DVT (deep venous thrombosis) ([facility])    LLE DVT '80's  . GERD (gastroesophageal reflux disease)   . Headache(784.0)    sinus headaches  . Leg pain   . Peripheral neuropathy ([place])   . Peripheral vascular disease ([place])   . Psoriasis   . Shortness of breath   . Sleep apnea    uses bipap, sleep study  done in [place] 2 years ago, dr [name]   BP (!) 148/58 (BP Location: Left Arm, Patient Position: Sitting, Cuff Size: Large)   Pulse 76   Resp 16   SpO2 95%   Opioid Risk Score:   Fall Risk Score:  `1  Depression screen PHQ 2/9  Depression screen [facility] 2/9 01/04/2016 09/19/2015 07/19/2015 12/30/2014 10/28/2014  Decreased Interest 0 0 2 0 0  Down, Depressed, Hopeless 0 0 1 0 0  PHQ - 2 Score 0 0 3 0 0  Altered sleeping - - 2 - 0  Tired, decreased energy - - 3 - 3  Change in appetite - - 3 - 3  Feeling bad or failure about yourself  - - 3 - 2  Trouble concentrating - - 0 - 0  Moving slowly or fidgety/restless - - 0 - 0  Suicidal thoughts - - 1 - 0  PHQ-9 Score - - 15 - 8  Difficult doing work/chores - - Somewhat difficult - -    Review of Systems  All other systems reviewed and are negative.      Objective:   Physical Exam  Constitutional: He is oriented to person, place, and time. He appears well-developed and well-nourished.  HENT:  Head: Normocephalic and atraumatic.  Neck: Normal range of motion. Neck supple.  Cardiovascular: Normal rate and regular rhythm.   Pulmonary/Chest: Effort normal and breath sounds normal.  Continuous Oxygen 3 liters nasal cannula  Musculoskeletal:  Normal Muscle Bulk and Muscle Testing Reveals: Upper Extremities: Full ROM and Muscle Strength 5/5 Back without spinal tenderness Lower Extremities: Right: Full ROM and Muscle Strength 5/5 Left: Left BKA Arrived in wheelchair  Neurological: He is alert and oriented to person, place, and time.  Skin: Skin is warm and dry.  Psychiatric: He has a normal mood and affect.  Nursing note and vitals reviewed.         Assessment & Plan:  1. Left BKA/ [place] Pain : Refilled: Oxycontin 15 mg one tablet every 12 hours#60 and Refilled: oxycodone 10 mg one tablet every 8 hours as needed # 90 for one month. Second script's given for the following month.  We will continue the opioid monitoring  program, this consists of regular clinic visits, examinations, urine drug screen, pill counts as well as use of [place] Controlled Substance Reporting System. Continue: Trileptal 150 mg one tablet BID 2. 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  3. Hx of spinal stimulator  4. COPD: On  Continuous Oxygen Therapy @ 3 liters Nasal Cannula. Pulmonology Following   20 minutes of face to face patient care time was spent during this visit. All questions were encouraged and answered.   F/U in 2 months

## 2016-01-16 ENCOUNTER — Ambulatory Visit: Payer: Medicare Other | Admitting: Registered Nurse

## 2016-01-18 ENCOUNTER — Ambulatory Visit: Payer: Medicare Other | Admitting: Registered Nurse

## 2016-02-09 ENCOUNTER — Other Ambulatory Visit: Payer: Self-pay | Admitting: *Deleted

## 2016-02-09 MED ORDER — AMITRIPTYLINE HCL 25 MG PO TABS: 25.0000 mg | ORAL_TABLET | Freq: Every day | ORAL | 0 refills | Status: DC

## 2016-03-06 ENCOUNTER — Other Ambulatory Visit: Payer: Self-pay | Admitting: *Deleted

## 2016-03-06 MED ORDER — AMITRIPTYLINE HCL 25 MG PO TABS: 25.0000 mg | ORAL_TABLET | Freq: Every day | ORAL | 0 refills | Status: DC

## 2016-03-08 ENCOUNTER — Encounter: Payer: Medicare Other | Attending: Physical Medicine & Rehabilitation | Admitting: Registered Nurse

## 2016-03-08 ENCOUNTER — Encounter: Payer: Self-pay | Admitting: Registered Nurse

## 2016-03-08 VITALS — BP 151/74 | HR 81 | Resp 14

## 2016-03-08 DIAGNOSIS — Z5181 Encounter for therapeutic drug level monitoring: Secondary | ICD-10-CM | POA: Diagnosis present

## 2016-03-08 DIAGNOSIS — M545 Low back pain, unspecified: Secondary | ICD-10-CM

## 2016-03-08 DIAGNOSIS — F329 Major depressive disorder, single episode, unspecified: Secondary | ICD-10-CM

## 2016-03-08 DIAGNOSIS — E1151 Type 2 diabetes mellitus with diabetic peripheral angiopathy without gangrene: Secondary | ICD-10-CM | POA: Insufficient documentation

## 2016-03-08 DIAGNOSIS — G546 Phantom limb syndrome with pain: Secondary | ICD-10-CM

## 2016-03-08 DIAGNOSIS — G8929 Other chronic pain: Secondary | ICD-10-CM

## 2016-03-08 DIAGNOSIS — S88112S Complete traumatic amputation at level between knee and ankle, left lower leg, sequela: Secondary | ICD-10-CM | POA: Diagnosis present

## 2016-03-08 DIAGNOSIS — R5381 Other malaise: Secondary | ICD-10-CM | POA: Diagnosis present

## 2016-03-08 DIAGNOSIS — Z79899 Other long term (current) drug therapy: Secondary | ICD-10-CM

## 2016-03-08 DIAGNOSIS — G894 Chronic pain syndrome: Secondary | ICD-10-CM | POA: Diagnosis present

## 2016-03-08 MED ORDER — DULOXETINE HCL 30 MG PO CPEP: 30.0000 mg | ORAL_CAPSULE | Freq: Every day | ORAL | 3 refills | Status: DC

## 2016-03-08 MED ORDER — OXYCODONE HCL 10 MG PO TABS: 10.0000 mg | ORAL_TABLET | Freq: Three times a day (TID) | ORAL | 0 refills | Status: DC | PRN

## 2016-03-08 MED ORDER — OXYCODONE HCL ER 15 MG PO T12A: 15.0000 mg | EXTENDED_RELEASE_TABLET | Freq: Two times a day (BID) | ORAL | 0 refills | Status: DC

## 2016-03-08 NOTE — Progress Notes (Signed)
Subjective:    Patient ID: Jeremy [name], male    DOB: 1950-08-05, 65 y.o.   MRN: [id number]  HPI: [name] is a 65 year old male who returns for follow up for chronic pain and medication refill. He states his pain is located in his lower back and left stump ( phantom pain). He rates his pain 3. His current exercise regime is performing stretching exercises and walking to the bathroom with his walker in his home. Encouraged to continue to increase activity he verbalizes understanding. [name] admits to being depressed, will resume Cymbalta he verbalizes understanding. Encouraged to call office with any questions or concerns. He verbalizes understanding.  Also states he was diagnosed with MRSA on his left  Scalp. PCP following    Pain Inventory Average Pain 6 Pain Right Now 3 My pain is constant, sharp, stabbing and aching  In the last 24 hours, has pain interfered with the following? General activity 8 Relation with others 7 Enjoyment of life 7 What TIME of day is your pain at its worst? morning Sleep (in general) Poor  Pain is worse with: inactivity Pain improves with: medication Relief from Meds: 6  Mobility use a walker ability to climb steps?  no do you drive?  yes Do you have any goals in this area?  no  Function retired Do you have any goals in this area?  yes  Neuro/Psych depression  Prior Studies Any changes since last visit?  no  Physicians involved in your care Any changes since last visit?  no   Family History  Problem Relation Age of Onset  . Heart disease Brother    Social History   Social History  . Marital status: Married    Spouse name: N/A  . Number of children: N/A  . Years of education: N/A   Social History Main Topics  . Smoking status: Former Smoker    Packs/day: 0.50    Years: 6.00    Types: Cigarettes    Quit date: 12/03/1990  . Smokeless tobacco: Never Used  . Alcohol use 0.0 oz/week     Comment: rare  . Drug use:   .  Sexual activity: Not Currently   Other Topics Concern  . None   Social History Narrative  . None   Past Surgical History:  Procedure Laterality Date  . AMPUTATION Left 11/25/2012   Procedure: AMPUTATION BELOW KNEE;  Surgeon: [name], MD;  Location: [place];  Service: Orthopedics;  Laterality: Left;  Left Below Knee Amputation  . APLIGRAFT PLACEMENT Left 07/31/2012   Procedure: Apply Skin Graft;  Surgeon: [name], MD;  Location: [place];  Service: Orthopedics;  Laterality: Left;  . APPLICATION OF WOUND VAC Left 07/31/2012   Procedure: APPLICATION OF WOUND VAC;  Surgeon: [name], MD;  Location: [place];  Service: Orthopedics;  Laterality: Left;  . arm surgery    . back stimulator    . CARDIAC CATHETERIZATION     Hx: of " around 1985 at [facility]"  . CYST EXCISION     Hx: of  . I&D EXTREMITY  03/13/2011   Procedure: IRRIGATION AND DEBRIDEMENT EXTREMITY;  Surgeon: [name], MD;  Location: [place];  Service: Orthopedics;  Laterality: Left;  . I&D EXTREMITY Left 11/06/2012   Procedure: IRRIGATION AND DEBRIDEMENT EXTREMITY-Left lower ;  Surgeon: [name], MD;  Location: [employer];  Service: Orthopedics;  Laterality: Left;  Left Leg Irrigation and Debridement, Place  Skin Graft, Wound VAC, [place]  . skin grafts    . SKIN SPLIT GRAFT  03/13/2012   Procedure: SKIN GRAFT SPLIT THICKNESS;  Surgeon: [name], MD;  Location: [place];  Service: Orthopedics;  Laterality: Left;  Excisional Debridement Left Leg, Split Thickness Skin Graft, Wound VAC  . stomach stapling    . tummy tuck    . VEIN LIGATION AND STRIPPING     Past Medical History:  Diagnosis Date  . Anemia   . Asthma   . Cancer ([place])    hx basal cell skin cancer  . CHF (congestive heart failure) ([place])    does not see a cardiologist  . COPD (chronic obstructive pulmonary disease) ([place])    3L continuous  . Diabetes mellitus    not on medication  . DVT (deep venous thrombosis) ([facility])    LLE DVT '80's  . GERD  (gastroesophageal reflux disease)   . Headache(784.0)    sinus headaches  . Leg pain   . Peripheral neuropathy ([place])   . Peripheral vascular disease ([place])   . Psoriasis   . Shortness of breath   . Sleep apnea    uses bipap, sleep study done in [place] 2 years ago, dr [name]   BP (!) 151/74   Pulse 81   Resp 14   SpO2 97%   Opioid Risk Score:   Fall Risk Score:  `1  Depression screen PHQ 2/9  Depression screen [facility] 2/9 01/04/2016 09/19/2015 07/19/2015 12/30/2014 10/28/2014  Decreased Interest 0 0 2 0 0  Down, Depressed, Hopeless 0 0 1 0 0  PHQ - 2 Score 0 0 3 0 0  Altered sleeping - - 2 - 0  Tired, decreased energy - - 3 - 3  Change in appetite - - 3 - 3  Feeling bad or failure about yourself  - - 3 - 2  Trouble concentrating - - 0 - 0  Moving slowly or fidgety/restless - - 0 - 0  Suicidal thoughts - - 1 - 0  PHQ-9 Score - - 15 - 8  Difficult doing work/chores - - Somewhat difficult - -     Review of Systems  Constitutional: Negative.   HENT: Negative.   Eyes: Negative.   Respiratory: Negative.   Cardiovascular: Negative.   Gastrointestinal: Negative.   Endocrine: Negative.   Genitourinary: Negative.   Musculoskeletal: Negative.   Skin: Negative.   Allergic/Immunologic: Negative.   Neurological: Negative.   Hematological: Negative.   Psychiatric/Behavioral: Negative.   All other systems reviewed and are negative.      Objective:   Physical Exam  Constitutional: He is oriented to person, place, and time. He appears well-developed and well-nourished.  HENT:  Head: Normocephalic and atraumatic.  Neck: Normal range of motion. Neck supple.  Cardiovascular: Normal rate and regular rhythm.   Pulmonary/Chest: Effort normal and breath sounds normal.  Continuous Oxygen 3 Liters Nasal Cannula  Musculoskeletal:  Normal Muscle Bulk and Muscle Testing Reveals: Upper Extremities: Full ROM and Muscle Strength 5/5 Back without spinal tenderness Lower Extremities:  Right: Full ROM and Muscle Strength 5/5 Left: BKA with [place]rinker Intact Arrived in wheelchair   Neurological: He is alert and oriented to person, place, and time.  Skin: Skin is warm and dry.  Psychiatric: He has a normal mood and affect.  Nursing note and vitals reviewed.         Assessment & Plan:  1. Left BKA/ [place] Pain : Refilled: Oxycontin 15 mg  one tablet every 12 hours#60 and Refilled: oxycodone 10 mg one tablet every 8 hours as needed # 90 for one month. Second script's given for the following month.  We will continue the opioid monitoring program, this consists of regular clinic visits, examinations, urine drug screen, pill counts as well as use of [place] Controlled Substance Reporting System. Continue: Trileptal 150 mg one tablet BID 2. 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  3. Hx of spinal stimulator  4. COPD: On Continuous Oxygen Therapy @ 3 liters Nasal Cannula. Pulmonology Following  5. Depression: RX: Cymbalta  20 minutes of face to face patient care time was spent during this visit. All questions were encouraged and answered.   F/U in 2 months

## 2016-03-11 ENCOUNTER — Ambulatory Visit: Payer: Medicare Other | Admitting: Physical Medicine & Rehabilitation

## 2016-03-12 ENCOUNTER — Other Ambulatory Visit: Payer: Self-pay | Admitting: *Deleted

## 2016-03-12 DIAGNOSIS — F329 Major depressive disorder, single episode, unspecified: Secondary | ICD-10-CM

## 2016-03-12 DIAGNOSIS — G546 Phantom limb syndrome with pain: Secondary | ICD-10-CM

## 2016-03-12 MED ORDER — DULOXETINE HCL 30 MG PO CPEP: 30.0000 mg | ORAL_CAPSULE | Freq: Every day | ORAL | 3 refills | Status: DC

## 2016-03-16 LAB — TOXASSURE SELECT,+ANTIDEPR,UR

## 2016-05-08 ENCOUNTER — Encounter: Payer: Medicare Other | Admitting: Physical Medicine & Rehabilitation

## 2016-05-15 ENCOUNTER — Encounter: Payer: Medicare Other | Attending: Physical Medicine & Rehabilitation | Admitting: Registered Nurse

## 2016-05-15 ENCOUNTER — Encounter: Payer: Self-pay | Admitting: Registered Nurse

## 2016-05-15 VITALS — BP 138/65 | HR 91 | Resp 14

## 2016-05-15 DIAGNOSIS — G546 Phantom limb syndrome with pain: Secondary | ICD-10-CM | POA: Diagnosis not present

## 2016-05-15 DIAGNOSIS — E1151 Type 2 diabetes mellitus with diabetic peripheral angiopathy without gangrene: Secondary | ICD-10-CM | POA: Insufficient documentation

## 2016-05-15 DIAGNOSIS — Z5181 Encounter for therapeutic drug level monitoring: Secondary | ICD-10-CM | POA: Diagnosis not present

## 2016-05-15 DIAGNOSIS — G894 Chronic pain syndrome: Secondary | ICD-10-CM

## 2016-05-15 DIAGNOSIS — G8929 Other chronic pain: Secondary | ICD-10-CM

## 2016-05-15 DIAGNOSIS — M545 Low back pain, unspecified: Secondary | ICD-10-CM

## 2016-05-15 DIAGNOSIS — S88112S Complete traumatic amputation at level between knee and ankle, left lower leg, sequela: Secondary | ICD-10-CM | POA: Diagnosis present

## 2016-05-15 DIAGNOSIS — R5381 Other malaise: Secondary | ICD-10-CM | POA: Diagnosis present

## 2016-05-15 DIAGNOSIS — Z79899 Other long term (current) drug therapy: Secondary | ICD-10-CM | POA: Diagnosis present

## 2016-05-15 MED ORDER — OXYCODONE HCL 10 MG PO TABS: 10.0000 mg | ORAL_TABLET | Freq: Four times a day (QID) | ORAL | 0 refills | Status: DC | PRN

## 2016-05-15 MED ORDER — OXYCODONE HCL ER 15 MG PO T12A: 15.0000 mg | EXTENDED_RELEASE_TABLET | Freq: Two times a day (BID) | ORAL | 0 refills | Status: DC

## 2016-05-15 NOTE — Progress Notes (Signed)
Subjective:    Patient ID: Jeremy [name], male    DOB: 1950/10/14, 67 y.o.   MRN: [id number]  HPI:  Jeremy [name] is a 66 year old male who returns for follow up appointment for chronic pain and medication refill. 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 Also stated his pain has intensified with the colder weather. We will increase his tablets and evaluate at next visit. He verbalizes understanding.  He rates his pain 1. His current exercise regime is performing stretching exercises and walking to the bathroom with his walker in his home.  Pain Inventory Average Pain 4 Pain Right Now 1 My pain is intermittent, sharp, stabbing and aching  In the last 24 hours, has pain interfered with the following? General activity 2 Relation with others 1 Enjoyment of life 3 What TIME of day is your pain at its worst? morning Sleep (in general) n/a  Pain is worse with: inactivity Pain improves with: medication Relief from Meds: 10  Mobility use a walker ability to climb steps?  no do you drive?  yes use a wheelchair transfers alone Do you have any goals in this area?  no  Function retired Do you have any goals in this area?  no  Neuro/Psych No problems in this area  Prior Studies Any changes since last visit?  no  Physicians involved in your care Any changes since last visit?  no   Family History  Problem Relation Age of Onset  . Heart disease Brother    Social History   Social History  . Marital status: Married    Spouse name: N/A  . Number of children: N/A  . Years of education: N/A   Social History Main Topics  . Smoking status: Former Smoker    Packs/day: 0.50    Years: 6.00    Types: Cigarettes    Quit date: 12/03/1990  . Smokeless tobacco: Never Used  . Alcohol use 0.0 oz/week     Comment: rare  . Drug use: Yes  . Sexual activity: Not Currently   Other Topics Concern  . None    Social History Narrative  . None   Past Surgical History:  Procedure Laterality Date  . AMPUTATION Left 11/25/2012   Procedure: AMPUTATION BELOW KNEE;  Surgeon: [name], MD;  Location: [place];  Service: Orthopedics;  Laterality: Left;  Left Below Knee Amputation  . APLIGRAFT PLACEMENT Left 07/31/2012   Procedure: Apply Skin Graft;  Surgeon: [name], MD;  Location: [place];  Service: Orthopedics;  Laterality: Left;  . APPLICATION OF WOUND VAC Left 07/31/2012   Procedure: APPLICATION OF WOUND VAC;  Surgeon: [name], MD;  Location: [place];  Service: Orthopedics;  Laterality: Left;  . arm surgery    . back stimulator    . CARDIAC CATHETERIZATION     Hx: of " around 1985 at [facility]"  . CYST EXCISION     Hx: of  . I&D EXTREMITY  03/13/2011   Procedure: IRRIGATION AND DEBRIDEMENT EXTREMITY;  Surgeon: [name], MD;  Location: [place];  Service: Orthopedics;  Laterality: Left;  . I&D EXTREMITY Left 11/06/2012   Procedure: IRRIGATION AND DEBRIDEMENT EXTREMITY-Left lower ;  Surgeon: [name], MD;  Location: [place];  Service: Orthopedics;  Laterality: Left;  Left Leg Irrigation and Debridement, Place Skin Graft, Wound VAC, [place]  . skin grafts    .  SKIN SPLIT GRAFT  03/13/2012   Procedure: SKIN GRAFT SPLIT THICKNESS;  Surgeon: [name], MD;  Location: [place];  Service: Orthopedics;  Laterality: Left;  Excisional Debridement Left Leg, Split Thickness Skin Graft, Wound VAC  . stomach stapling    . tummy tuck    . VEIN LIGATION AND STRIPPING     Past Medical History:  Diagnosis Date  . Anemia   . Asthma   . Cancer ([place])    hx basal cell skin cancer  . CHF (congestive heart failure) ([place])    does not see a cardiologist  . COPD (chronic obstructive pulmonary disease) ([place])    3L continuous  . Diabetes mellitus    not on medication  . DVT (deep venous thrombosis) ([facility])    LLE DVT '80's  . GERD (gastroesophageal reflux disease)   . Headache(784.0)    sinus headaches   . Leg pain   . Peripheral neuropathy ([place])   . Peripheral vascular disease ([place])   . Psoriasis   . Shortness of breath   . Sleep apnea    uses bipap, sleep study done in [place] 2 years ago, dr [name]   BP 138/65   Pulse 91   Resp 14   SpO2 95%   Opioid Risk Score:   Fall Risk Score:  `1  Depression screen PHQ 2/9  Depression screen [facility] 2/9 01/04/2016 09/19/2015 07/19/2015 12/30/2014 10/28/2014  Decreased Interest 0 0 2 0 0  Down, Depressed, Hopeless 0 0 1 0 0  PHQ - 2 Score 0 0 3 0 0  Altered sleeping - - 2 - 0  Tired, decreased energy - - 3 - 3  Change in appetite - - 3 - 3  Feeling bad or failure about yourself  - - 3 - 2  Trouble concentrating - - 0 - 0  Moving slowly or fidgety/restless - - 0 - 0  Suicidal thoughts - - 1 - 0  PHQ-9 Score - - 15 - 8  Difficult doing work/chores - - Somewhat difficult - -     Review of Systems  Constitutional: Negative.   HENT: Negative.   Eyes: Negative.   Respiratory: Negative.   Cardiovascular: Negative.   Gastrointestinal: Negative.   Endocrine: Negative.   Genitourinary: Negative.   Musculoskeletal: Negative.   Skin: Negative.   Allergic/Immunologic: Negative.   Neurological: Negative.   Hematological: Negative.   Psychiatric/Behavioral: Negative.   All other systems reviewed and are negative.      Objective:   Physical Exam  Constitutional: He is oriented to person, place, and time. He appears well-developed and well-nourished.  HENT:  Head: Normocephalic and atraumatic.  Neck: Normal range of motion. Neck supple.  Cardiovascular: Normal rate and regular rhythm.   Pulmonary/Chest: Effort normal and breath sounds normal.  Continuous Oxygen at 3 liters nasal cannula  Musculoskeletal:  Normal Muscle Bulk and Muscle Testing Reveals: Upper Extremities: Full ROM and Muscle Strength 5/5 Back without spinal tenderness noted Lower Extremities: Right: Full ROM and Muscle Strength 5/5 Left: BKA Arrived in  wheelchair  Neurological: He is alert and oriented to person, place, and time.  Skin: Skin is warm and dry.  Psychiatric: He has a normal mood and affect.  Nursing note and vitals reviewed.         Assessment & Plan:  1. Left BKA/ [place] Pain : Refilled: Oxycontin 15 mg one tablet every 12 hours#60 and Refilled: oxycodone 10 mg one tablet every 6 hours as  needed #Increased to 100 tablets, may take an extra  0.5 to 1 tablet when pain is severe.  Second script's given for the following month.  We will continue the opioid monitoring program, this consists of regular clinic visits, examinations, urine drug screen, pill counts as well as use of [place] Controlled Substance Reporting System. Continue: Trileptal 150 mg one tablet BID 2. 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  3. Hx of spinal stimulator  4. COPD: On Continuous Oxygen Therapy @ 3 liters Nasal Cannula. Pulmonology Following  5. Depression: Continue: Cymbalta  20 minutes of face to face patient care time was spent during this visit. All questions were encouraged and answered.   F/U in 2 months

## 2016-07-03 ENCOUNTER — Encounter: Payer: Self-pay | Admitting: Physical Medicine & Rehabilitation

## 2016-07-03 ENCOUNTER — Encounter: Payer: Medicare Other | Attending: Physical Medicine & Rehabilitation | Admitting: Physical Medicine & Rehabilitation

## 2016-07-03 VITALS — BP 142/73 | HR 65

## 2016-07-03 DIAGNOSIS — S88112S Complete traumatic amputation at level between knee and ankle, left lower leg, sequela: Secondary | ICD-10-CM

## 2016-07-03 DIAGNOSIS — E1151 Type 2 diabetes mellitus with diabetic peripheral angiopathy without gangrene: Secondary | ICD-10-CM | POA: Diagnosis present

## 2016-07-03 DIAGNOSIS — Z79899 Other long term (current) drug therapy: Secondary | ICD-10-CM | POA: Insufficient documentation

## 2016-07-03 DIAGNOSIS — G546 Phantom limb syndrome with pain: Secondary | ICD-10-CM

## 2016-07-03 DIAGNOSIS — S88119S Complete traumatic amputation at level between knee and ankle, unspecified lower leg, sequela: Secondary | ICD-10-CM | POA: Diagnosis not present

## 2016-07-03 DIAGNOSIS — G894 Chronic pain syndrome: Secondary | ICD-10-CM | POA: Diagnosis present

## 2016-07-03 DIAGNOSIS — R5381 Other malaise: Secondary | ICD-10-CM | POA: Insufficient documentation

## 2016-07-03 DIAGNOSIS — Z5181 Encounter for therapeutic drug level monitoring: Secondary | ICD-10-CM | POA: Insufficient documentation

## 2016-07-03 MED ORDER — OXYCODONE HCL ER 15 MG PO T12A: 15.0000 mg | EXTENDED_RELEASE_TABLET | Freq: Two times a day (BID) | ORAL | 0 refills | Status: DC

## 2016-07-03 MED ORDER — TOPIRAMATE 50 MG PO TABS: 50.0000 mg | ORAL_TABLET | Freq: Every day | ORAL | 2 refills | Status: DC

## 2016-07-03 MED ORDER — OXYCODONE HCL 10 MG PO TABS: 10.0000 mg | ORAL_TABLET | Freq: Four times a day (QID) | ORAL | 0 refills | Status: DC | PRN

## 2016-07-03 NOTE — Patient Instructions (Signed)
PLEASE FEEL FREE TO CALL OUR OFFICE WITH ANY PROBLEMS OR QUESTIONS ([phone])      

## 2016-07-03 NOTE — Progress Notes (Signed)
Subjective:    Patient ID: Jeremy [name], male    DOB: 24-Apr-1950, 66 y.o.   MRN: [id number]  HPI   Jeremy [name] is here in follow up of his chronic pain. He states that his pain levels have been very reasonable except when the weather is very cold. He is using topamax instead of trileptal for nerve/phantom pain. Overall he is happy with his pain control. His wife has been working with another pain doctor and has had some issues of her own. He is more worried about her than anything else.   He remains on oxycontin 15mg  q12 and oxycodone IR 10mg  q6 prn. He has remained compliant with our program. Bowels are moving regularly  Pain Inventory Average Pain 3 Pain Right Now 0 My pain is sharp, dull, stabbing and aching  In the last 24 hours, has pain interfered with the following? General activity 4 Relation with others 3 Enjoyment of life 4 What TIME of day is your pain at its worst? . Sleep (in general) .  Pain is worse with: . Pain improves with: . Relief from Meds: 10  Mobility use a walker ability to climb steps?  no do you drive?  yes use a wheelchair  Function retired  Neuro/Psych No problems in this area  Prior Studies Any changes since last visit?  no  Physicians involved in your care Any changes since last visit?  no   Family History  Problem Relation Age of Onset  . Heart disease Brother    Social History   Social History  . Marital status: Married    Spouse name: N/A  . Number of children: N/A  . Years of education: N/A   Social History Main Topics  . Smoking status: Former Smoker    Packs/day: 0.50    Years: 6.00    Types: Cigarettes    Quit date: 12/03/1990  . Smokeless tobacco: Never Used  . Alcohol use 0.0 oz/week     Comment: rare  . Drug use: Yes  . Sexual activity: Not Currently   Other Topics Concern  . None   Social History Narrative  . None   Past Surgical History:  Procedure Laterality Date  . AMPUTATION Left 11/25/2012   Procedure: AMPUTATION BELOW KNEE;  Surgeon: [name], MD;  Location: [place];  Service: Orthopedics;  Laterality: Left;  Left Below Knee Amputation  . APLIGRAFT PLACEMENT Left 07/31/2012   Procedure: Apply Skin Graft;  Surgeon: [name], MD;  Location: [place];  Service: Orthopedics;  Laterality: Left;  . APPLICATION OF WOUND VAC Left 07/31/2012   Procedure: APPLICATION OF WOUND VAC;  Surgeon: [name], MD;  Location: [place];  Service: Orthopedics;  Laterality: Left;  . arm surgery    . back stimulator    . CARDIAC CATHETERIZATION     Hx: of " around 1985 at [facility]"  . CYST EXCISION     Hx: of  . I&D EXTREMITY  03/13/2011   Procedure: IRRIGATION AND DEBRIDEMENT EXTREMITY;  Surgeon: [name], MD;  Location: [place];  Service: Orthopedics;  Laterality: Left;  . I&D EXTREMITY Left 11/06/2012   Procedure: IRRIGATION AND DEBRIDEMENT EXTREMITY-Left lower ;  Surgeon: [name], MD;  Location: [place] [facility];  Service: Orthopedics;  Laterality: Left;  Left Leg Irrigation and Debridement, Place Skin Graft, Wound VAC, [place]  . skin grafts    . SKIN SPLIT GRAFT  03/13/2012   Procedure: SKIN GRAFT SPLIT THICKNESS;  Surgeon: [name], MD;  Location: [place];  Service: Orthopedics;  Laterality: Left;  Excisional Debridement Left Leg, Split Thickness Skin Graft, Wound VAC  . stomach stapling    . tummy tuck    . VEIN LIGATION AND STRIPPING     Past Medical History:  Diagnosis Date  . Anemia   . Asthma   . Cancer ([place])    hx basal cell skin cancer  . CHF (congestive heart failure) ([place])    does not see a cardiologist  . COPD (chronic obstructive pulmonary disease) ([place])    3L continuous  . Diabetes mellitus    not on medication  . DVT (deep venous thrombosis) ([facility])    LLE DVT '80's  . GERD (gastroesophageal reflux disease)   . Headache(784.0)    sinus headaches  . Leg pain   . Peripheral neuropathy ([place])   . Peripheral vascular disease ([place])   . Psoriasis   . Shortness of breath     . Sleep apnea    uses bipap, sleep study done in [place] 2 years ago, dr [name]   BP (!) 142/73   Pulse 65   SpO2 95%   Opioid Risk Score:   Fall Risk Score:  `1  Depression screen PHQ 2/9  Depression screen [facility] 2/9 01/04/2016 09/19/2015 07/19/2015 12/30/2014 10/28/2014  Decreased Interest 0 0 2 0 0  Down, Depressed, Hopeless 0 0 1 0 0  PHQ - 2 Score 0 0 3 0 0  Altered sleeping - - 2 - 0  Tired, decreased energy - - 3 - 3  Change in appetite - - 3 - 3  Feeling bad or failure about yourself  - - 3 - 2  Trouble concentrating - - 0 - 0  Moving slowly or fidgety/restless - - 0 - 0  Suicidal thoughts - - 1 - 0  PHQ-9 Score - - 15 - 8  Difficult doing work/chores - - Somewhat difficult - -    Review of Systems  Constitutional: Negative.   HENT: Negative.   Eyes: Negative.   Respiratory: Positive for shortness of breath.   Cardiovascular: Negative.   Gastrointestinal: Negative.   Endocrine: Negative.   Genitourinary: Negative.   Musculoskeletal: Negative.   Skin: Negative.   Allergic/Immunologic: Negative.   Neurological: Negative.   Hematological: Negative.   Psychiatric/Behavioral: Negative.   All other systems reviewed and are negative.      Objective:   Physical Exam Constitutional: He is oriented to person, place, and time. He appears well-developed and well-nourished. In wheelchair  HENT:  Head: Normocephalic and atraumatic.  Neck: Normal range of motion. Neck supple.  Cardiovascular:RRR  Pulmonary/Chest: Effort normal and breath sounds normal.  Continuous Oxygen at 3 liters nasal cannula--no distress Musculoskeletal: He exhibits 1+ edema.  Left BKA with mild flexion contracture of 10 degrees Neurological: He is alert and oriented to person, place, and time.  Motor 5/5 UE Skin: Skin is warm and dry. He has breakdown on the right ear auricle. He has some lesions on the head from scratching.  Psychiatric: He is pleasant and cooperative    Assessment &  Plan: 1. Left BKA/ Phantom Pain:  -refilled oxycontin 15mg  q12 #60. Provided second rx for next month. We will continue the opioid monitoring program, this consists of regular clinic visits, examinations, urine drug screen, pill counts as well as use of [place] Controlled Substance Reporting System. [place].  -oxycodone10mg  ir one q8 #100. Second rx  given for next month -topamax 50mg  qhs -now off trileptal 2. 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  3. Hx of spinal stimulator  4. COPD: On Continuous Oxygen Therapy @ 3 liters Nasal Cannula per Pulmonology  5. Reactive depression:  cymbalta back to 30mg   6. Kidney stones              -seemed to have resolved  -continue hydration  -watch  While topamax  15 minutes of face to face patient care time was spent during this visit. All questions were encouraged and answered.  F/U in 2 months with me or NP

## 2016-07-07 ENCOUNTER — Other Ambulatory Visit: Payer: Self-pay | Admitting: Registered Nurse

## 2016-07-07 DIAGNOSIS — G546 Phantom limb syndrome with pain: Secondary | ICD-10-CM

## 2016-07-07 DIAGNOSIS — F329 Major depressive disorder, single episode, unspecified: Secondary | ICD-10-CM

## 2016-07-08 LAB — TOXASSURE SELECT,+ANTIDEPR,UR

## 2016-07-11 ENCOUNTER — Other Ambulatory Visit: Payer: Self-pay | Admitting: Registered Nurse

## 2016-07-11 DIAGNOSIS — F329 Major depressive disorder, single episode, unspecified: Secondary | ICD-10-CM

## 2016-07-11 DIAGNOSIS — G546 Phantom limb syndrome with pain: Secondary | ICD-10-CM

## 2016-08-02 ENCOUNTER — Telehealth: Payer: Self-pay | Admitting: Registered Nurse

## 2016-08-02 NOTE — Telephone Encounter (Signed)
Jeremy [name] had a UDS performed on 07/03/2016, it was consistent.

## 2016-08-26 ENCOUNTER — Encounter: Payer: Medicare Other | Attending: Physical Medicine & Rehabilitation | Admitting: Registered Nurse

## 2016-08-26 ENCOUNTER — Encounter: Payer: Self-pay | Admitting: Registered Nurse

## 2016-08-26 VITALS — BP 113/71 | HR 68

## 2016-08-26 DIAGNOSIS — Z79899 Other long term (current) drug therapy: Secondary | ICD-10-CM | POA: Diagnosis present

## 2016-08-26 DIAGNOSIS — Z5181 Encounter for therapeutic drug level monitoring: Secondary | ICD-10-CM | POA: Diagnosis present

## 2016-08-26 DIAGNOSIS — M545 Low back pain, unspecified: Secondary | ICD-10-CM

## 2016-08-26 DIAGNOSIS — G8929 Other chronic pain: Secondary | ICD-10-CM

## 2016-08-26 DIAGNOSIS — E1151 Type 2 diabetes mellitus with diabetic peripheral angiopathy without gangrene: Secondary | ICD-10-CM | POA: Insufficient documentation

## 2016-08-26 DIAGNOSIS — S88119S Complete traumatic amputation at level between knee and ankle, unspecified lower leg, sequela: Secondary | ICD-10-CM | POA: Diagnosis not present

## 2016-08-26 DIAGNOSIS — G546 Phantom limb syndrome with pain: Secondary | ICD-10-CM | POA: Diagnosis not present

## 2016-08-26 DIAGNOSIS — G894 Chronic pain syndrome: Secondary | ICD-10-CM | POA: Diagnosis present

## 2016-08-26 DIAGNOSIS — S88112S Complete traumatic amputation at level between knee and ankle, left lower leg, sequela: Secondary | ICD-10-CM | POA: Diagnosis present

## 2016-08-26 DIAGNOSIS — R5381 Other malaise: Secondary | ICD-10-CM | POA: Insufficient documentation

## 2016-08-26 MED ORDER — OXYCODONE HCL ER 15 MG PO T12A: 15.0000 mg | EXTENDED_RELEASE_TABLET | Freq: Two times a day (BID) | ORAL | 0 refills | Status: DC

## 2016-08-26 MED ORDER — OXYCODONE HCL 10 MG PO TABS: 10.0000 mg | ORAL_TABLET | Freq: Four times a day (QID) | ORAL | 0 refills | Status: DC | PRN

## 2016-08-26 NOTE — Progress Notes (Signed)
Subjective:    Patient ID: Jeremy [name], male    DOB: 07-24-1950, 66 y.o.   MRN: [id number]  HPI: Jeremy [name] is a 66 year old male who returns for follow up appointment for chronic pain and medication refill. He states his pain is located in his left side ( he thinks he's about to pass a kidney stone) , lower back, left stump ( phantom pain) and buttock pain from sacral decubitus. Also states he will follow up with his PCP regarding sacral pain.  He rates his pain 7. His current exercise regime is performing stretching exercises in bed and walking to the bathroom with his walker in his home. Jeremy [name] was prescribed Topamax, he was instructed if he develops nephrolithiasis, his Topamax will be discontinued, he verbalizes understanding.   Jeremy [name] last UDS was performed on 07/03/2016, it was consistent.    Pain Inventory Average Pain 5 Pain Right Now 7 My pain is intermittent, sharp, stabbing and aching  In the last 24 hours, has pain interfered with the following? General activity 0 Relation with others 0 Enjoyment of life 8 What TIME of day is your pain at its worst? morning Sleep (in general) Good  Pain is worse with: inactivity Pain improves with: medication Relief from Meds: 8  Mobility use a walker how many minutes can you walk? 2-3 ability to climb steps?  no do you drive?  yes use a wheelchair needs help with transfers Do you have any goals in this area?  no  Function retired I need assistance with the following:  dressing, meal prep and shopping Do you have any goals in this area?  no  Neuro/Psych No problems in this area  Prior Studies Any changes since last visit?  no  Physicians involved in your care Any changes since last visit?  no   Family History  Problem Relation Age of Onset  . Heart disease Brother    Social History   Social History  . Marital status: Married    Spouse name: N/A  . Number of children: N/A  . Years of education: N/A     Social History Main Topics  . Smoking status: Former Smoker    Packs/day: 0.50    Years: 6.00    Types: Cigarettes    Quit date: 12/03/1990  . Smokeless tobacco: Never Used  . Alcohol use 0.0 oz/week     Comment: rare  . Drug use: Yes  . Sexual activity: Not Currently   Other Topics Concern  . None   Social History Narrative  . None   Past Surgical History:  Procedure Laterality Date  . AMPUTATION Left 11/25/2012   Procedure: AMPUTATION BELOW KNEE;  Surgeon: [name], MD;  Location: [place];  Service: Orthopedics;  Laterality: Left;  Left Below Knee Amputation  . APLIGRAFT PLACEMENT Left 07/31/2012   Procedure: Apply Skin Graft;  Surgeon: [name], MD;  Location: [place];  Service: Orthopedics;  Laterality: Left;  . APPLICATION OF WOUND VAC Left 07/31/2012   Procedure: APPLICATION OF WOUND VAC;  Surgeon: [name], MD;  Location: [place];  Service: Orthopedics;  Laterality: Left;  . arm surgery    . back stimulator    . CARDIAC CATHETERIZATION     Hx: of " around 1985 at [facility]"  . CYST EXCISION     Hx: of  . I&D EXTREMITY  03/13/2011   Procedure: IRRIGATION AND DEBRIDEMENT EXTREMITY;  Surgeon: Illene Regulus  Sharol Given, MD;  Location: [place];  Service: Orthopedics;  Laterality: Left;  . I&D EXTREMITY Left 11/06/2012   Procedure: IRRIGATION AND DEBRIDEMENT EXTREMITY-Left lower ;  Surgeon: [name], MD;  Location: [place];  Service: Orthopedics;  Laterality: Left;  Left Leg Irrigation and Debridement, Place Skin Graft, Wound VAC, [place]  . skin grafts    . SKIN SPLIT GRAFT  03/13/2012   Procedure: SKIN GRAFT SPLIT THICKNESS;  Surgeon: [name], MD;  Location: [place];  Service: Orthopedics;  Laterality: Left;  Excisional Debridement Left Leg, Split Thickness Skin Graft, Wound VAC  . stomach stapling    . tummy tuck    . VEIN LIGATION AND STRIPPING     Past Medical History:  Diagnosis Date  . Anemia   . Asthma   . Cancer ([place])    hx basal cell skin cancer  . CHF  (congestive heart failure) ([place])    does not see a cardiologist  . COPD (chronic obstructive pulmonary disease) ([place])    3L continuous  . Diabetes mellitus    not on medication  . DVT (deep venous thrombosis) ([facility])    LLE DVT '80's  . GERD (gastroesophageal reflux disease)   . Headache(784.0)    sinus headaches  . Leg pain   . Peripheral neuropathy   . Peripheral vascular disease ([place])   . Psoriasis   . Shortness of breath   . Sleep apnea    uses bipap, sleep study done in [place] 2 years ago, dr [name]   BP 113/71   Pulse 68   SpO2 94%   Opioid Risk Score:   Fall Risk Score:  `1  Depression screen PHQ 2/9  Depression screen [facility] 2/9 01/04/2016 09/19/2015 07/19/2015 12/30/2014 10/28/2014  Decreased Interest 0 0 2 0 0  Down, Depressed, Hopeless 0 0 1 0 0  PHQ - 2 Score 0 0 3 0 0  Altered sleeping - - 2 - 0  Tired, decreased energy - - 3 - 3  Change in appetite - - 3 - 3  Feeling bad or failure about yourself  - - 3 - 2  Trouble concentrating - - 0 - 0  Moving slowly or fidgety/restless - - 0 - 0  Suicidal thoughts - - 1 - 0  PHQ-9 Score - - 15 - 8  Difficult doing work/chores - - Somewhat difficult - -    Review of Systems  Constitutional: Negative.   HENT: Negative.   Eyes: Negative.   Respiratory: Positive for apnea, cough, shortness of breath and wheezing.   Cardiovascular: Negative.   Gastrointestinal: Negative.   Endocrine: Negative.   Genitourinary: Positive for difficulty urinating.  Musculoskeletal: Negative.   Skin: Negative.   Allergic/Immunologic: Negative.   Neurological: Negative.   Hematological: Negative.   Psychiatric/Behavioral: Negative.        Objective:   Physical Exam  Constitutional: He is oriented to person, place, and time. He appears well-developed and well-nourished.  HENT:  Head: Normocephalic and atraumatic.  Neck: Normal range of motion. Neck supple.  Cardiovascular: Normal rate and regular rhythm.   Pulmonary/Chest:  Effort normal and breath sounds normal.  Musculoskeletal:  Normal Muscle Bulk and Muscle Testing Reveals: Upper Extremities: Full ROM and Muscle Strength 5/5 Lower Extremities: Left: BKA wearing shrinker Right: Full ROM and Muscle Strength 5/5 Right Lower Extremity with Velcro Wrap intact Arrived in wheelchair  Neurological: He is alert and oriented to person, place, and time.  Skin: Skin is  warm and dry.  Psychiatric: He has a normal mood and affect.  Nursing note and vitals reviewed.         Assessment & Plan:  1.Left BKA/ [place] Pain : Refilled: Oxycontin 15 mg one tablet every 12 hours#60 and Refilled: oxycodone 10 mg one tablet every 6 hours as needed #I100 tablets, may take an extra  0.5 to 1 tablet when pain is severe.  Second script's given for the following month. 08/26/2016 We will continue the opioid monitoring program, this consists of regular clinic visits, examinations, urine drug screen, pill counts as well as use of [place] Controlled Substance Reporting System. Continue: Topamax 50 mg one tablet at HS. 2. Chronic Bilateral Low Back Pain without Sciatica/ Chronic  pain syndrome related to his chronic left leg pain: Continue Current medication Regime. 08/26/2016 3. Hx of spinal stimulator. Continue to Monitor. 08/26/2016 4. COPD: On Continuous Oxygen Therapy @ 3 liters Nasal Cannula. Pulmonology Following . 08/26/2016 5. Depression: Continue: Cymbalta. 08/26/2016  20 minutes of face to face patient care time was spent during this visit. All questions were encouraged and answered.   F/U in 2 months

## 2016-09-02 ENCOUNTER — Ambulatory Visit: Payer: Medicare Other | Admitting: Registered Nurse

## 2016-09-12 ENCOUNTER — Telehealth: Payer: Self-pay

## 2016-09-12 NOTE — Telephone Encounter (Signed)
If the [facility] is removed from the product, he can use it. Otherwise he'll test positive

## 2016-09-12 NOTE — Telephone Encounter (Signed)
Relayed the message to the patient.

## 2016-09-12 NOTE — Telephone Encounter (Signed)
Patient called, wants to know if it ok to use OTC CBD oil (cannabidiol) in addition to his current medication regime,  Please advise

## 2016-09-15 ENCOUNTER — Other Ambulatory Visit: Payer: Self-pay | Admitting: Physical Medicine & Rehabilitation

## 2016-10-25 ENCOUNTER — Telehealth: Payer: Self-pay | Admitting: Physical Medicine & Rehabilitation

## 2016-10-25 NOTE — Telephone Encounter (Signed)
He should NOT be self-dosing himself first of all. He should be informing us of these issues BEFORE he changes medication doses. He should be seeing a wound care specialist if the wounds are that bad. Is he??  He will need to return to office for a visit, either with me or RN visit (as Zella Ball is away), to get anymore medication

## 2016-10-25 NOTE — Telephone Encounter (Signed)
PATIENT LEFT VOICEMAIL THAT HE IS TAKING 4 OXY DAILY DUE TO SKIN BREAK DOWN ON LOWER HIP AND BACK SIDE HAS BECOME VERY PAINFUL - WANTS TO ADVISE HE IS GOING TO RUN OUT - WHAT SHOULD HE DO?

## 2016-10-28 NOTE — Telephone Encounter (Signed)
Jeremy [name] notified and instructed on not increasing medication on his own. He acknowledges understanding.  He will be able to make it to his 11/04/16 appt with Jeremy [name]. (per [facility] he filled his Oxycontin on 10/15/16 and his Oxycodone on 10/09/16.  He says he has a doctor that comes by and looks at his hip and prescribed some medication for it.

## 2016-11-04 ENCOUNTER — Encounter: Payer: Medicare Other | Attending: Physical Medicine & Rehabilitation | Admitting: Registered Nurse

## 2016-11-04 ENCOUNTER — Encounter: Payer: Self-pay | Admitting: Registered Nurse

## 2016-11-04 VITALS — BP 109/68 | HR 63

## 2016-11-04 DIAGNOSIS — S88112S Complete traumatic amputation at level between knee and ankle, left lower leg, sequela: Secondary | ICD-10-CM | POA: Insufficient documentation

## 2016-11-04 DIAGNOSIS — G546 Phantom limb syndrome with pain: Secondary | ICD-10-CM | POA: Diagnosis not present

## 2016-11-04 DIAGNOSIS — G894 Chronic pain syndrome: Secondary | ICD-10-CM | POA: Diagnosis present

## 2016-11-04 DIAGNOSIS — M545 Low back pain, unspecified: Secondary | ICD-10-CM

## 2016-11-04 DIAGNOSIS — G8929 Other chronic pain: Secondary | ICD-10-CM

## 2016-11-04 DIAGNOSIS — E1151 Type 2 diabetes mellitus with diabetic peripheral angiopathy without gangrene: Secondary | ICD-10-CM | POA: Insufficient documentation

## 2016-11-04 DIAGNOSIS — R5381 Other malaise: Secondary | ICD-10-CM | POA: Insufficient documentation

## 2016-11-04 DIAGNOSIS — Z79899 Other long term (current) drug therapy: Secondary | ICD-10-CM | POA: Insufficient documentation

## 2016-11-04 DIAGNOSIS — Z5181 Encounter for therapeutic drug level monitoring: Secondary | ICD-10-CM | POA: Insufficient documentation

## 2016-11-04 MED ORDER — OXYCODONE HCL 10 MG PO TABS: 10.0000 mg | ORAL_TABLET | Freq: Four times a day (QID) | ORAL | 0 refills | Status: DC | PRN

## 2016-11-04 MED ORDER — OXYCODONE HCL ER 15 MG PO T12A: 15.0000 mg | EXTENDED_RELEASE_TABLET | Freq: Two times a day (BID) | ORAL | 0 refills | Status: DC

## 2016-11-04 NOTE — Progress Notes (Signed)
Subjective:    Patient ID: Jeremy [name], male    DOB: August 18, 1950, 66 y.o.   MRN: [id number]  HPI: [name] is a 66 year old male who returns for follow up appointmentfor chronic pain and medication refill. He states his pain is located in his lower back,left stump ( phantom pain). He rated his pain 0. His current exercise regime is performing stretching exercises in bed and walking to the bathroom with his walker in his home.  Mr. Dillenbeck last UDS was performed on 07/03/2016, it was consistent.    Pain Inventory Average Pain 6 Pain Right Now 0 My pain is .  In the last 24 hours, has pain interfered with the following? General activity 3 Relation with others 2 Enjoyment of life 6 What TIME of day is your pain at its worst? daytime Sleep (in general) Fair  Pain is worse with: sitting Pain improves with: medication Relief from Meds: 7  Mobility use a walker ability to climb steps?  yes do you drive?  yes  Function retired  Neuro/Psych No problems in this area  Prior Studies .  Physicians involved in your care .   Family History  Problem Relation Age of Onset  . Heart disease Brother    Social History   Social History  . Marital status: Married    Spouse name: N/A  . Number of children: N/A  . Years of education: N/A   Social History Main Topics  . Smoking status: Former Smoker    Packs/day: 0.50    Years: 6.00    Types: Cigarettes    Quit date: 12/03/1990  . Smokeless tobacco: Never Used  . Alcohol use 0.0 oz/week     Comment: rare  . Drug use: Yes  . Sexual activity: Not Currently   Other Topics Concern  . None   Social History Narrative  . None   Past Surgical History:  Procedure Laterality Date  . AMPUTATION Left 11/25/2012   Procedure: AMPUTATION BELOW KNEE;  Surgeon: [name], MD;  Location: [facility];  Service: Orthopedics;  Laterality: Left;  Left Below Knee Amputation  . APLIGRAFT PLACEMENT Left 07/31/2012   Procedure: Apply  Skin Graft;  Surgeon: [name], MD;  Location: [facility];  Service: Orthopedics;  Laterality: Left;  . APPLICATION OF WOUND VAC Left 07/31/2012   Procedure: APPLICATION OF WOUND VAC;  Surgeon: [name], MD;  Location: [facility];  Service: Orthopedics;  Laterality: Left;  . arm surgery    . back stimulator    . CARDIAC CATHETERIZATION     Hx: of " around 1985 at [facility]"  . CYST EXCISION     Hx: of  . I&D EXTREMITY  03/13/2011   Procedure: IRRIGATION AND DEBRIDEMENT EXTREMITY;  Surgeon: [name], MD;  Location: [place];  Service: Orthopedics;  Laterality: Left;  . I&D EXTREMITY Left 11/06/2012   Procedure: IRRIGATION AND DEBRIDEMENT EXTREMITY-Left lower ;  Surgeon: [name], MD;  Location: [facility];  Service: Orthopedics;  Laterality: Left;  Left Leg Irrigation and Debridement, Place Skin Graft, Wound VAC, [place]  . skin grafts    . SKIN SPLIT GRAFT  03/13/2012   Procedure: SKIN GRAFT SPLIT THICKNESS;  Surgeon: [name], MD;  Location: [facility];  Service: Orthopedics;  Laterality: Left;  Excisional Debridement Left Leg, Split Thickness Skin Graft, Wound VAC  . stomach stapling    . tummy tuck    . VEIN LIGATION  AND STRIPPING     Past Medical History:  Diagnosis Date  . Anemia   . Asthma   . Cancer ([place])    hx basal cell skin cancer  . CHF (congestive heart failure) ([place])    does not see a cardiologist  . COPD (chronic obstructive pulmonary disease) ([place])    3L continuous  . Diabetes mellitus    not on medication  . DVT (deep venous thrombosis) ([facility])    LLE DVT '80's  . GERD (gastroesophageal reflux disease)   . Headache(784.0)    sinus headaches  . Leg pain   . Peripheral neuropathy   . Peripheral vascular disease ([place])   . Psoriasis   . Shortness of breath   . Sleep apnea    uses bipap, sleep study done in [place] 2 years ago, dr [name]   BP 109/68   Pulse 63   SpO2 95%   Opioid Risk Score:   Fall Risk Score:  `1  Depression screen PHQ  2/9  Depression screen [facility] 2/9 11/04/2016 01/04/2016 09/19/2015 07/19/2015 12/30/2014 10/28/2014  Decreased Interest 0 0 0 2 0 0  Down, Depressed, Hopeless 0 0 0 1 0 0  PHQ - 2 Score 0 0 0 3 0 0  Altered sleeping - - - 2 - 0  Tired, decreased energy - - - 3 - 3  Change in appetite - - - 3 - 3  Feeling bad or failure about yourself  - - - 3 - 2  Trouble concentrating - - - 0 - 0  Moving slowly or fidgety/restless - - - 0 - 0  Suicidal thoughts - - - 1 - 0  PHQ-9 Score - - - 15 - 8  Difficult doing work/chores - - - Somewhat difficult - -      Review of Systems  Constitutional: Negative.   HENT: Negative.   Eyes: Negative.   Respiratory: Negative.   Cardiovascular: Negative.   Gastrointestinal: Negative.   Endocrine: Negative.   Genitourinary: Negative.   Musculoskeletal: Negative.   Skin: Negative.   Allergic/Immunologic: Negative.   Neurological: Negative.   Hematological: Negative.   Psychiatric/Behavioral: Negative.   All other systems reviewed and are negative.      Objective:   Physical Exam  Constitutional: He is oriented to person, place, and time. He appears well-developed and well-nourished.  HENT:  Head: Normocephalic and atraumatic.  Neck: Normal range of motion. Neck supple.  Cardiovascular: Normal rate and regular rhythm.   Pulmonary/Chest: Effort normal and breath sounds normal.  Musculoskeletal:  Normal Muscle Bulk and Muscle Testing Reveals:  Upper Extremities: Full ROM and Muscle Strength 5/5 Back without Spinal Tenderness  Lower Extremities: Right: Full ROM and Muscle Strength 5/5 Left: BKA Arrived in wheelchair  Neurological: He is alert and oriented to person, place, and time.  Skin: Skin is warm and dry.  Psychiatric: He has a normal mood and affect.  Nursing note and vitals reviewed.         Assessment & Plan:  1.Left BKA/ [place] Pain : Refilled: Oxycontin 15 mg one tablet every 12 hours#60 and Refilled: oxycodone 10 mg one tablet every  6hours as needed #I100 tablets, may take an extra 0.5 to 1 tablet when pain is severe. Second script given for the following month. 11/04/2016 We will continue the opioid monitoring program, this consists of regular clinic visits, examinations, urine drug screen, pill counts as well as use of [place] Controlled Substance Reporting System. Continue: Topamax  50 mg one tablet at HS. 2. Chronic Bilateral Low Back Pain without Sciatica/ Chronic  pain syndrome related to his chronic left leg pain: Continue Current medication Regime. 11/04/2016 3. Hx of spinal stimulator. Continue to Monitor. 11/04/2016 4. COPD: On Continuous Oxygen Therapy @ 3 liters Nasal Cannula. Pulmonology Following . 11/04/2016 5. Depression: Continue: Cymbalta. 11/04/2016  20  minutes of face to face patient care time was spent during this visit. All questions were encouraged and answered.   F/U in 2 months

## 2016-11-06 ENCOUNTER — Telehealth: Payer: Self-pay | Admitting: Registered Nurse

## 2016-11-06 NOTE — Telephone Encounter (Signed)
On 11/06/2016 the  [name] was reviewed no conflict was seen on the [place] with multiple prescribers. [name] has a signed narcotic contract with our office. If there were any discrepancies this would have been reported to his physician.

## 2016-11-09 LAB — DRUG TOX MONITOR 1 W/CONF, ORAL FLD
Amphetamines: NEGATIVE ng/mL (ref <10)
BENZODIAZEPINES: NEGATIVE ng/mL (ref <0.50)
BUPRENORPHINE: NEGATIVE ng/mL (ref <0.025)
Barbiturates: NEGATIVE ng/mL (ref <10)
Cocaine: NEGATIVE ng/mL (ref <2.5)
Codeine: NEGATIVE ng/mL (ref <2.5)
DIHYDROCODEINE: NEGATIVE ng/mL (ref <2.5)
FENTANYL: NEGATIVE ng/mL (ref <0.10)
HEROIN METABOLITE: NEGATIVE ng/mL (ref <1.0)
HYDROCODONE: NEGATIVE ng/mL (ref <2.5)
HYDROMORPHONE: NEGATIVE ng/mL (ref <2.5)
MARIJUANA: NEGATIVE ng/mL (ref <2.5)
MDMA: NEGATIVE ng/mL (ref <10)
MEPROBAMATE: NEGATIVE ng/mL (ref <2.5)
METHADONE: NEGATIVE ng/mL (ref <5.0)
Meperidine: NEGATIVE ng/mL (ref <5.0)
Morphine: NEGATIVE ng/mL (ref <2.5)
NORHYDROCODONE: NEGATIVE ng/mL (ref <2.5)
Nicotine Metabolite: NEGATIVE ng/mL (ref <5.0)
Noroxycodone: 32.5 ng/mL — ABNORMAL HIGH (ref <2.5)
OPIATES: POSITIVE ng/mL — AB (ref <2.5)
Oxycodone: 222.8 ng/mL — ABNORMAL HIGH (ref <2.5)
Oxymorphone: NEGATIVE ng/mL (ref <2.5)
PHENCYCLIDINE: NEGATIVE ng/mL (ref <10)
Propoxyphene: NEGATIVE ng/mL (ref <5.0)
TAPENTADOL: NEGATIVE ng/mL (ref <5.0)
TRAMADOL: NEGATIVE ng/mL (ref <5.0)
Zolpidem: NEGATIVE ng/mL (ref <5.0)

## 2016-11-09 LAB — DRUG TOX ALC METAB W/CON, ORAL FLD: ALCOHOL METABOLITE: NEGATIVE ng/mL (ref <25)

## 2016-11-21 ENCOUNTER — Telehealth: Payer: Self-pay | Admitting: *Deleted

## 2016-11-21 NOTE — Telephone Encounter (Signed)
Oral swab drug screen was consistent for prescribed medications.  ?

## 2016-12-06 ENCOUNTER — Other Ambulatory Visit: Payer: Self-pay | Admitting: Physical Medicine & Rehabilitation

## 2016-12-06 DIAGNOSIS — F329 Major depressive disorder, single episode, unspecified: Secondary | ICD-10-CM

## 2016-12-06 DIAGNOSIS — G546 Phantom limb syndrome with pain: Secondary | ICD-10-CM

## 2016-12-15 ENCOUNTER — Other Ambulatory Visit: Payer: Self-pay | Admitting: Registered Nurse

## 2016-12-27 ENCOUNTER — Telehealth: Payer: Self-pay | Admitting: Registered Nurse

## 2016-12-27 MED ORDER — AMITRIPTYLINE HCL 25 MG PO TABS: 25.0000 mg | ORAL_TABLET | Freq: Every day | ORAL | 0 refills | Status: DC

## 2016-12-27 NOTE — Telephone Encounter (Signed)
Placed a call to Mr. Halt, he states he's taking his amitriptyline at HS 25-50 mg. Amitriptyline re-ordered. He verbalizes understanding.

## 2016-12-30 ENCOUNTER — Encounter: Payer: Self-pay | Admitting: Registered Nurse

## 2016-12-30 ENCOUNTER — Encounter: Payer: Medicare Other | Attending: Physical Medicine & Rehabilitation | Admitting: Registered Nurse

## 2016-12-30 VITALS — BP 113/66 | HR 90

## 2016-12-30 DIAGNOSIS — E1151 Type 2 diabetes mellitus with diabetic peripheral angiopathy without gangrene: Secondary | ICD-10-CM | POA: Diagnosis present

## 2016-12-30 DIAGNOSIS — Z79899 Other long term (current) drug therapy: Secondary | ICD-10-CM

## 2016-12-30 DIAGNOSIS — M545 Low back pain, unspecified: Secondary | ICD-10-CM

## 2016-12-30 DIAGNOSIS — G8929 Other chronic pain: Secondary | ICD-10-CM

## 2016-12-30 DIAGNOSIS — G894 Chronic pain syndrome: Secondary | ICD-10-CM

## 2016-12-30 DIAGNOSIS — G546 Phantom limb syndrome with pain: Secondary | ICD-10-CM

## 2016-12-30 DIAGNOSIS — R5381 Other malaise: Secondary | ICD-10-CM | POA: Insufficient documentation

## 2016-12-30 DIAGNOSIS — Z5181 Encounter for therapeutic drug level monitoring: Secondary | ICD-10-CM

## 2016-12-30 DIAGNOSIS — S88112S Complete traumatic amputation at level between knee and ankle, left lower leg, sequela: Secondary | ICD-10-CM

## 2016-12-30 MED ORDER — OXYCODONE HCL ER 15 MG PO T12A: 15.0000 mg | EXTENDED_RELEASE_TABLET | Freq: Two times a day (BID) | ORAL | 0 refills | Status: DC

## 2016-12-30 MED ORDER — OXYCODONE HCL 10 MG PO TABS: 10.0000 mg | ORAL_TABLET | Freq: Four times a day (QID) | ORAL | 0 refills | Status: DC | PRN

## 2016-12-30 NOTE — Progress Notes (Signed)
Subjective:    Patient ID: Jeremy [name], male    DOB: Jul 18, 1950, 66 y.o.   MRN: [id number]  HPI: [name] is a 66 year old male who returns for follow up appointmentfor chronic pain and medication refill. He states his pain is located in his right side he believes he's passing a kidney stone PCP following, lower back and left stump ( phantom pain). He rates his pain 3. His current exercise regime is performing stretching exercises in bed and walking to the bathroom with his walker in his home. He arrived in his wheelchair.  Mr. [place] Oral Swab was performed on 11/04/2016, it was consistent.    Pain Inventory Average Pain 4 Pain Right Now 3 My pain is na  In the last 24 hours, has pain interfered with the following? General activity 3 Relation with others 3 Enjoyment of life 3 What TIME of day is your pain at its worst? na Sleep (in general) Fair  Pain is worse with: sitting Pain improves with: medication Relief from Meds: 7  Mobility use a walker ability to climb steps?  no do you drive?  yes use a wheelchair needs help with transfers  Function retired I need assistance with the following:  meal prep, household duties and shopping  Neuro/Psych No problems in this area  Prior Studies Any changes since last visit?  no  Physicians involved in your care Any changes since last visit?  no   Family History  Problem Relation Age of Onset  . Heart disease Brother    Social History   Social History  . Marital status: Married    Spouse name: N/A  . Number of children: N/A  . Years of education: N/A   Social History Main Topics  . Smoking status: Former Smoker    Packs/day: 0.50    Years: 6.00    Types: Cigarettes    Quit date: 12/03/1990  . Smokeless tobacco: Never Used  . Alcohol use 0.0 oz/week     Comment: rare  . Drug use: Yes  . Sexual activity: Not Currently   Other Topics Concern  . None   Social History Narrative  . None   Past  Surgical History:  Procedure Laterality Date  . AMPUTATION Left 11/25/2012   Procedure: AMPUTATION BELOW KNEE;  Surgeon: [name], MD;  Location: [place];  Service: Orthopedics;  Laterality: Left;  Left Below Knee Amputation  . APLIGRAFT PLACEMENT Left 07/31/2012   Procedure: Apply Skin Graft;  Surgeon: [name], MD;  Location: [place];  Service: Orthopedics;  Laterality: Left;  . APPLICATION OF WOUND VAC Left 07/31/2012   Procedure: APPLICATION OF WOUND VAC;  Surgeon: [name], MD;  Location: [place];  Service: Orthopedics;  Laterality: Left;  . arm surgery    . back stimulator    . CARDIAC CATHETERIZATION     Hx: of " around 1985 at [facility]"  . CYST EXCISION     Hx: of  . I&D EXTREMITY  03/13/2011   Procedure: IRRIGATION AND DEBRIDEMENT EXTREMITY;  Surgeon: [name], MD;  Location: [place];  Service: Orthopedics;  Laterality: Left;  . I&D EXTREMITY Left 11/06/2012   Procedure: IRRIGATION AND DEBRIDEMENT EXTREMITY-Left lower ;  Surgeon: [name], MD;  Location: [facility];  Service: Orthopedics;  Laterality: Left;  Left Leg Irrigation and Debridement, Place Skin Graft, Wound VAC, [place]  . skin grafts    . SKIN SPLIT GRAFT  03/13/2012  Procedure: SKIN GRAFT SPLIT THICKNESS;  Surgeon: [name], MD;  Location: [place];  Service: Orthopedics;  Laterality: Left;  Excisional Debridement Left Leg, Split Thickness Skin Graft, Wound VAC  . stomach stapling    . tummy tuck    . VEIN LIGATION AND STRIPPING     Past Medical History:  Diagnosis Date  . Anemia   . Asthma   . Cancer ([place])    hx basal cell skin cancer  . CHF (congestive heart failure) ([place])    does not see a cardiologist  . COPD (chronic obstructive pulmonary disease) ([place])    3L continuous  . Diabetes mellitus    not on medication  . DVT (deep venous thrombosis) ([facility])    LLE DVT '80's  . GERD (gastroesophageal reflux disease)   . Headache(784.0)    sinus headaches  . Leg pain   . Peripheral neuropathy   .  Peripheral vascular disease ([place])   . Psoriasis   . Shortness of breath   . Sleep apnea    uses bipap, sleep study done in [place] 2 years ago, dr [name]   BP 113/66 (BP Location: Left Arm, Patient Position: Sitting, Cuff Size: Large)   Pulse 90   SpO2 98%   Opioid Risk Score:  0 Fall Risk Score:  `1  Depression screen PHQ 2/9  Depression screen [facility] 2/9 12/30/2016 11/04/2016 01/04/2016 09/19/2015 07/19/2015 12/30/2014 10/28/2014  Decreased Interest 0 0 0 0 2 0 0  Down, Depressed, Hopeless 0 0 0 0 1 0 0  PHQ - 2 Score 0 0 0 0 3 0 0  Altered sleeping - - - - 2 - 0  Tired, decreased energy - - - - 3 - 3  Change in appetite - - - - 3 - 3  Feeling bad or failure about yourself  - - - - 3 - 2  Trouble concentrating - - - - 0 - 0  Moving slowly or fidgety/restless - - - - 0 - 0  Suicidal thoughts - - - - 1 - 0  PHQ-9 Score - - - - 15 - 8  Difficult doing work/chores - - - - Somewhat difficult - -      Review of Systems  Constitutional: Negative.   HENT: Negative.   Eyes: Negative.   Respiratory: Negative.   Cardiovascular: Negative.   Gastrointestinal: Negative.   Endocrine: Negative.   Genitourinary: Negative.   Musculoskeletal: Negative.   Skin: Negative.   Allergic/Immunologic: Negative.   Neurological: Negative.   Hematological: Negative.   Psychiatric/Behavioral: Negative.   All other systems reviewed and are negative.      Objective:   Physical Exam  Constitutional: He is oriented to person, place, and time. He appears well-developed and well-nourished.  HENT:  Head: Normocephalic and atraumatic.  Neck: Normal range of motion. Neck supple.  Cardiovascular: Normal rate and regular rhythm.   Pulmonary/Chest: Effort normal and breath sounds normal.  Continuous Oxygen at 3 liters nasal cannula  Musculoskeletal:  Normal Muscle Bulk and Muscle Testing Reveals:  Upper Extremities: Full ROM and Muscle Strength 5/5 Lumbar Paraspinal Tenderness: L-3-L-5 Lower  Extremities: Right: Full ROM and Muscle Strength 5/5 Left: BKA Arrived in wheelchair  Neurological: He is alert and oriented to person, place, and time.  Skin: Skin is warm and dry.  Psychiatric: He has a normal mood and affect.  Nursing note and vitals reviewed.         Assessment & Plan:  1.Left  BKA/ [place] Pain : Refilled: Oxycontin 15 mg one tablet every 12 hours#60 and Refilled: oxycodone 10 mg one tablet every 6hours as needed # 100 tablets, Second script given for the following month. 12/30/2016 We will continue the opioid monitoring program, this consists of regular clinic visits, examinations, urine drug screen, pill counts as well as use of [place] Controlled Substance Reporting System. Continue: Topamax 50 mg one tablet at HS and Amitriptyline..  2. Chronic Bilateral Low Back Pain without Sciatica/ Chronic  pain syndrome related to his chronic left leg pain: Continue Current medication Regime. 12/30/2016 3. Hx of spinal stimulator. Continue to Monitor. 12/30/2016 4. COPD: On Continuous Oxygen Therapy @ 3 liters Nasal Cannula. Pulmonology Following . 12/30/2016 5. Depression: Continue: Cymbalta. 12/30/2016  20 minutes of face to face patient care time was spent during this visit. All questions were encouraged and answered.   F/U in 2 months

## 2017-01-02 ENCOUNTER — Encounter: Payer: Medicare Other | Admitting: Registered Nurse

## 2017-01-29 ENCOUNTER — Telehealth: Payer: Self-pay

## 2017-01-29 NOTE — Telephone Encounter (Signed)
I called in patients prescription

## 2017-01-29 NOTE — Telephone Encounter (Signed)
I spoke to Mr. Bouch , I will ask the pharmacist to fill his Oxycodone. This provider placed wrong date on the prescription. He verbalized understanding.

## 2017-01-29 NOTE — Telephone Encounter (Signed)
Patient called stating that he needs his prescription filled and that he got his last prescription 01/01/17.

## 2017-02-10 ENCOUNTER — Telehealth: Payer: Self-pay

## 2017-02-10 NOTE — Telephone Encounter (Signed)
Patients wife called stating that patient has had a heart attack and is septic and in the hospital. She stated that she doesn't know what medications are being given. She states that the hospital is aware that he has a pain contract with our facility. She just called to let Dr. Naaman Plummer know.

## 2017-02-20 ENCOUNTER — Telehealth: Payer: Self-pay

## 2017-02-20 NOTE — Telephone Encounter (Signed)
Patient called stating that he was rushed to the hospital Saturday  for congestive heart failure and has some other issues going on that is causing more pain and he has been having to take more pain medication. He just wanted to let Dr. Naaman Plummer know about this so that he is covered.

## 2017-02-21 NOTE — Telephone Encounter (Signed)
He needs to call us BEFORE he uses more pain medication. Also, CHF should not cause him to use more meds

## 2017-02-24 NOTE — Telephone Encounter (Signed)
Notified. 

## 2017-02-26 ENCOUNTER — Encounter: Payer: Medicare Other | Attending: Physical Medicine & Rehabilitation | Admitting: Physical Medicine & Rehabilitation

## 2017-02-26 ENCOUNTER — Encounter: Payer: Self-pay | Admitting: Physical Medicine & Rehabilitation

## 2017-02-26 VITALS — BP 114/62 | HR 55

## 2017-02-26 DIAGNOSIS — S88112S Complete traumatic amputation at level between knee and ankle, left lower leg, sequela: Secondary | ICD-10-CM | POA: Diagnosis present

## 2017-02-26 DIAGNOSIS — R5381 Other malaise: Secondary | ICD-10-CM | POA: Insufficient documentation

## 2017-02-26 DIAGNOSIS — M545 Low back pain, unspecified: Secondary | ICD-10-CM

## 2017-02-26 DIAGNOSIS — G546 Phantom limb syndrome with pain: Secondary | ICD-10-CM

## 2017-02-26 DIAGNOSIS — G894 Chronic pain syndrome: Secondary | ICD-10-CM | POA: Diagnosis present

## 2017-02-26 DIAGNOSIS — Z79899 Other long term (current) drug therapy: Secondary | ICD-10-CM | POA: Diagnosis present

## 2017-02-26 DIAGNOSIS — E1151 Type 2 diabetes mellitus with diabetic peripheral angiopathy without gangrene: Secondary | ICD-10-CM | POA: Diagnosis present

## 2017-02-26 DIAGNOSIS — G8929 Other chronic pain: Secondary | ICD-10-CM

## 2017-02-26 DIAGNOSIS — Z5181 Encounter for therapeutic drug level monitoring: Secondary | ICD-10-CM | POA: Insufficient documentation

## 2017-02-26 MED ORDER — OXYCODONE HCL 10 MG PO TABS: 10.0000 mg | ORAL_TABLET | Freq: Four times a day (QID) | ORAL | 0 refills | Status: DC | PRN

## 2017-02-26 MED ORDER — OXYCODONE HCL ER 15 MG PO T12A: 15.0000 mg | EXTENDED_RELEASE_TABLET | Freq: Two times a day (BID) | ORAL | 0 refills | Status: DC

## 2017-02-26 NOTE — Progress Notes (Signed)
Subjective:    Patient ID: Jeremy [name], male    DOB: 05-03-1950, 66 y.o.   MRN: [id number]  HPI   Jeremy [name] is here in follow up of his chronic pain. He was in the hospital for sepsis and CHF two weeks ago and almost "died". He has been feeling better since then although he developed pressure wounds and has been using more of his oxycodones as a result. They are interfering withi his sleep and cause pain when he's in his chair too.   He has had no new problems with his left leg.   His breathing is at baseline, and he remains on 3L.  Pain Inventory Average Pain 7 Pain Right Now 4 My pain is sharp, stabbing and aching  In the last 24 hours, has pain interfered with the following? General activity 2 Relation with others 2 Enjoyment of life 3 What TIME of day is your pain at its worst? morning Sleep (in general) Poor  Pain is worse with: inactivity Pain improves with: medication Relief from Meds: .  Mobility use a walker use a wheelchair  Function retired  Neuro/Psych No problems in this area  Prior Studies Any changes since last visit?  no  Physicians involved in your care Any changes since last visit?  no   Family History  Problem Relation Age of Onset  . Heart disease Brother    Social History   Socioeconomic History  . Marital status: Married    Spouse name: Not on file  . Number of children: Not on file  . Years of education: Not on file  . Highest education level: Not on file  Social Needs  . Financial resource strain: Not on file  . Food insecurity - worry: Not on file  . Food insecurity - inability: Not on file  . Transportation needs - medical: Not on file  . Transportation needs - non-medical: Not on file  Occupational History  . Not on file  Tobacco Use  . Smoking status: Former Smoker    Packs/day: 0.50    Years: 6.00    Pack years: 3.00    Types: Cigarettes    Last attempt to quit: 12/03/1990    Years since quitting: 26.2  . Smokeless  tobacco: Never Used  Substance and Sexual Activity  . Alcohol use: Yes    Alcohol/week: 0.0 oz    Comment: rare  . Drug use: Yes  . Sexual activity: Not Currently  Other Topics Concern  . Not on file  Social History Narrative  . Not on file   Past Surgical History:  Procedure Laterality Date  . arm surgery    . back stimulator    . CARDIAC CATHETERIZATION     Hx: of " around 1985 at [facility]"  . CYST EXCISION     Hx: of  . skin grafts    . stomach stapling    . tummy tuck    . VEIN LIGATION AND STRIPPING     Past Medical History:  Diagnosis Date  . Anemia   . Asthma   . Cancer ([place])    hx basal cell skin cancer  . CHF (congestive heart failure) ([place])    does not see a cardiologist  . COPD (chronic obstructive pulmonary disease) ([place])    3L continuous  . Diabetes mellitus    not on medication  . DVT (deep venous thrombosis) ([facility])    LLE DVT '80's  . GERD (gastroesophageal reflux disease)   .  Headache(784.0)    sinus headaches  . Leg pain   . Peripheral neuropathy   . Peripheral vascular disease ([place])   . Psoriasis   . Shortness of breath   . Sleep apnea    uses bipap, sleep study done in [place] 2 years ago, dr [name]   There were no vitals taken for this visit.  Opioid Risk Score:   Fall Risk Score:  `1  Depression screen PHQ 2/9  Depression screen [facility] 2/9 12/30/2016 11/04/2016 01/04/2016 09/19/2015 07/19/2015 12/30/2014 10/28/2014  Decreased Interest 0 0 0 0 2 0 0  Down, Depressed, Hopeless 0 0 0 0 1 0 0  PHQ - 2 Score 0 0 0 0 3 0 0  Altered sleeping - - - - 2 - 0  Tired, decreased energy - - - - 3 - 3  Change in appetite - - - - 3 - 3  Feeling bad or failure about yourself  - - - - 3 - 2  Trouble concentrating - - - - 0 - 0  Moving slowly or fidgety/restless - - - - 0 - 0  Suicidal thoughts - - - - 1 - 0  PHQ-9 Score - - - - 15 - 8  Difficult doing work/chores - - - - Somewhat difficult - -     Review of Systems  Constitutional: Negative.   HENT:  Negative.   Eyes: Negative.   Respiratory: Positive for apnea.   Cardiovascular: Negative.   Gastrointestinal: Positive for diarrhea.  Endocrine: Negative.   Genitourinary: Negative.   Musculoskeletal: Negative.   Skin: Negative.   Allergic/Immunologic: Negative.   Neurological: Negative.   Hematological: Negative.   Psychiatric/Behavioral: Negative.   All other systems reviewed and are negative.      Objective:   Physical Exam  Constitutional: He remains morbidly obese.  He is in a wheelchair.   HENT:  Head: Normocephalic and atraumatic.  Neck: Normal range of motion. Neck supple.  Cardiovascular: RRR  Pulmonary/Chest: normal effort.  Continuous Oxygen at 3 liters nasal cannulacontinues Musculoskeletal: 1+ edema in LE's. .  Normal Muscle Bulk and Muscle Testing Reveals: Upper Extremities: Full ROM and Muscle Strength 5/5 Back without spinal or paraspinal tenderness Lower Extremities: Right Full ROM and Muscle strength 5/5 Left BKA with persistent flexion contracture.  Neurological: He is alert and oriented to person, place, and time.  Skin: Skin is warm and dry. Wounds on buttock/back not visualized.   Psychiatric: He is pleasant and cooperative    Assessment & Plan: 1. Left BKA/ Phantom Pain:  -refilled  oxycontin 15mg  q12. Second refill for next month -oxycodone10mg  ir one q8 prn.  he was instructed not to self-medicate  -We will continue the opioid monitoring program, this consists of regular clinic visits, examinations, routine drug screening, pill counts as well as use of [place] Controlled Substance Reporting System. [place] was [place] today.    -maintain trileptal for phantom limb pain---pt feels it's helped. Need to consider it as a possible source of his kidney stones however. 2. 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  3. Hx of spinal stimulator  4. COPD: On Continuous Oxygen Therapy @ 3 liters Nasal  Cannula per Pulmonology  5. Reactive depression:  cymbalta back to 30mg   6. Hx of kidney stones 7. Pressure sores: per [facility] RN.     -needs to focus on nutrition, local wound care, pressure relief  10 minutes of face to face patient  care time was spent during this visit. All questions were encouraged and answered.  F/U in 2 months

## 2017-02-26 NOTE — Patient Instructions (Signed)
REMEMBER TO RELIEVE PRESSURE IN CHAIR OR BED.  GOOD NUTRITION, PLENTY OF PROTEIN KEEP WOUNDS CLEAN   PLEASE FEEL FREE TO CALL OUR OFFICE WITH ANY PROBLEMS OR QUESTIONS ([phone])

## 2017-03-12 ENCOUNTER — Telehealth: Payer: Self-pay | Admitting: *Deleted

## 2017-03-12 NOTE — Telephone Encounter (Signed)
Patient called and left a message stating that he has had to take 4th oxycodone  for the last two weeks.  He says he is reporting to Dr. Naaman Plummer per Dr. Charm Barges request.  He says has been treated at the wound care for a stage 3 'some thing or other' on his lower back.  He is waking up at night in pain and that is why he has needed to take the 4th tablet. Now, it appears his script is written to allow a little lee-way.  Dr. Charm Barges last clinic note indicates that the patient should call proactively before increasing.  Please advise and/or FYI

## 2017-03-12 NOTE — Telephone Encounter (Signed)
He was given leeway to take for on certain days when his pain is more intense.  I asked him to call us before he unilaterally increased his own dosing schedule.  I will allow him to refill slightly early this time if need be but this will be the last time we fill prescription before the date is due for refill

## 2017-03-17 ENCOUNTER — Other Ambulatory Visit: Payer: Self-pay

## 2017-03-17 NOTE — Telephone Encounter (Signed)
Called patient, states his wife went to a [facility] and was able to get it filled at that pharmacy

## 2017-03-17 NOTE — Telephone Encounter (Signed)
Patient called stating the oxycontin 15mg  that was sent to [facility] in [place] can not be filled. He said that the pharmacy told him that Dr. Naaman Plummer is not signed up for medicare part D and they can not fill this prescription unless it is written by someone else. Patient stated he is out of medication now.

## 2017-03-21 ENCOUNTER — Telehealth: Payer: Self-pay

## 2017-03-21 NOTE — Telephone Encounter (Signed)
Patient called wishing to speak to [facility] about receiving an early refill of medication.  Called patient back for more information as to which medication needed to the refill, patient stated needed a refill on the oxycodone mostly because he has had to take a "4th pill" each day due to a open wound on his back that is being managed by a wound care center.  Informed patient that I would send the message to [facility] and that she would talk to him about the refill.

## 2017-03-21 NOTE — Telephone Encounter (Signed)
Return Mr. Newstrom call, no answer left message to return the call. Reviewed Dr. Naaman Plummer note from 02/26/2017, Mr. Kirk was instructed not to self medicate. If he is looking for a early re-fill this will not be done. We will see him on a monthly basis, awaiting Mr. Rosenfield call to informed him regarding the above.

## 2017-03-27 ENCOUNTER — Telehealth: Payer: Self-pay

## 2017-03-27 NOTE — Telephone Encounter (Signed)
Placed a call to Jeremy [name], he states he has a Stage 3 Ulcer which has increase his  Pain, he is receiving treatment at [facility] in [place], he was given our Fax Number so we may be able to receive documentation. Explain in detail again how he should be taking his medication as prescribed and reviewed Dr. Naaman Plummer instructions, he verbalizes understanding. Also reports he was able to pick up his Oxycodone today.

## 2017-03-27 NOTE — Telephone Encounter (Signed)
Patient called stating that he is out of his oxycodone and has been out since (03/26/17). He stated that the pharmacy wont fill his prescription because of improper filing. I spoke to patient and he said that he spoke to his insurance company and they told him that the pharmacy is failing to enter the information. He needs advise on what to do until he gets his prescription filled and what to do about withdrawal symptoms. Please advise.

## 2017-03-27 NOTE — Telephone Encounter (Signed)
Patients due for refill tomorrow....please advise on withdrawal symptom

## 2017-04-18 ENCOUNTER — Other Ambulatory Visit: Payer: Self-pay

## 2017-04-18 ENCOUNTER — Encounter: Payer: Medicare Other | Attending: Physical Medicine & Rehabilitation | Admitting: Registered Nurse

## 2017-04-18 ENCOUNTER — Encounter: Payer: Self-pay | Admitting: Registered Nurse

## 2017-04-18 VITALS — BP 125/70 | HR 70

## 2017-04-18 DIAGNOSIS — S88112S Complete traumatic amputation at level between knee and ankle, left lower leg, sequela: Secondary | ICD-10-CM | POA: Diagnosis present

## 2017-04-18 DIAGNOSIS — G894 Chronic pain syndrome: Secondary | ICD-10-CM | POA: Diagnosis not present

## 2017-04-18 DIAGNOSIS — Z79899 Other long term (current) drug therapy: Secondary | ICD-10-CM | POA: Diagnosis present

## 2017-04-18 DIAGNOSIS — G546 Phantom limb syndrome with pain: Secondary | ICD-10-CM | POA: Diagnosis not present

## 2017-04-18 DIAGNOSIS — G8929 Other chronic pain: Secondary | ICD-10-CM

## 2017-04-18 DIAGNOSIS — M545 Low back pain, unspecified: Secondary | ICD-10-CM

## 2017-04-18 DIAGNOSIS — E1151 Type 2 diabetes mellitus with diabetic peripheral angiopathy without gangrene: Secondary | ICD-10-CM | POA: Insufficient documentation

## 2017-04-18 DIAGNOSIS — R5381 Other malaise: Secondary | ICD-10-CM | POA: Diagnosis present

## 2017-04-18 DIAGNOSIS — Z5181 Encounter for therapeutic drug level monitoring: Secondary | ICD-10-CM

## 2017-04-18 DIAGNOSIS — L89153 Pressure ulcer of sacral region, stage 3: Secondary | ICD-10-CM

## 2017-04-18 MED ORDER — OXYCODONE HCL 10 MG PO TABS: 10.0000 mg | ORAL_TABLET | Freq: Four times a day (QID) | ORAL | 0 refills | Status: DC | PRN

## 2017-04-18 MED ORDER — OXYCODONE HCL ER 15 MG PO T12A: 15.0000 mg | EXTENDED_RELEASE_TABLET | Freq: Two times a day (BID) | ORAL | 0 refills | Status: DC

## 2017-04-18 NOTE — Progress Notes (Signed)
Subjective:    Patient ID: Jeremy [name], male    DOB: May 10, 1950, 66 y.o.   MRN: [id number]  HPI: Jeremy [name] is a 66 year old male who returns for follow up appointmentfor chronic pain and medication refill. He states his pain is located in his lower back, left stump ( phantom pain), and sacral pain ( decubitus).  Jeremy [name] has a Stage 3 sacral decubitus with Tunneling noted, he is going to the [place]. Also reports his sacral pain has increased in intensity, we will increase his Oxycodone tablets #120.  He rates his pain 4. His current exercise regime is performing stretching exercises in bed and walking to the bathroom with his walker in his home. He arrived in his wheelchair. Wife in room.  Jeremy [name] Oral Swab was performed on 11/04/2016, it was consistent.    Pain Inventory Average Pain 5 Pain Right Now 4 My pain is sharp, stabbing and aching  In the last 24 hours, has pain interfered with the following? General activity 0 Relation with others 0 Enjoyment of life 0 What TIME of day is your pain at its worst? na Sleep (in general) Fair  Pain is worse with: sitting Pain improves with: medication Relief from Meds: 7  Mobility use a walker ability to climb steps?  no do you drive?  yes use a wheelchair needs help with transfers  Function retired I need assistance with the following:  meal prep, household duties and shopping  Neuro/Psych No problems in this area  Prior Studies Any changes since last visit?  no  Physicians involved in your care Any changes since last visit?  no   Family History  Problem Relation Age of Onset  . Heart disease Brother    Social History   Socioeconomic History  . Marital status: Married    Spouse name: None  . Number of children: None  . Years of education: None  . Highest education level: None  Social Needs  . Financial resource strain: None  . Food insecurity - worry: None  . Food insecurity - inability:  None  . Transportation needs - medical: None  . Transportation needs - non-medical: None  Occupational History  . None  Tobacco Use  . Smoking status: Former Smoker    Packs/day: 0.50    Years: 6.00    Pack years: 3.00    Types: Cigarettes    Last attempt to quit: 12/03/1990    Years since quitting: 26.3  . Smokeless tobacco: Never Used  Substance and Sexual Activity  . Alcohol use: Yes    Alcohol/week: 0.0 oz    Comment: rare  . Drug use: Yes  . Sexual activity: Not Currently  Other Topics Concern  . None  Social History Narrative  . None   Past Surgical History:  Procedure Laterality Date  . AMPUTATION Left 11/25/2012   Procedure: AMPUTATION BELOW KNEE;  Surgeon: [name], MD;  Location: [facility];  Service: Orthopedics;  Laterality: Left;  Left Below Knee Amputation  . APLIGRAFT PLACEMENT Left 07/31/2012   Procedure: Apply Skin Graft;  Surgeon: [name], MD;  Location: [place];  Service: Orthopedics;  Laterality: Left;  . APPLICATION OF WOUND VAC Left 07/31/2012   Procedure: APPLICATION OF WOUND VAC;  Surgeon: [name], MD;  Location: [place];  Service: Orthopedics;  Laterality: Left;  . arm surgery    . back stimulator    . CARDIAC CATHETERIZATION  Hx: of " around 1985 at [facility]"  . CYST EXCISION     Hx: of  . I&D EXTREMITY  03/13/2011   Procedure: IRRIGATION AND DEBRIDEMENT EXTREMITY;  Surgeon: [name], MD;  Location: [facility];  Service: Orthopedics;  Laterality: Left;  . I&D EXTREMITY Left 11/06/2012   Procedure: IRRIGATION AND DEBRIDEMENT EXTREMITY-Left lower ;  Surgeon: [name], MD;  Location: [facility];  Service: Orthopedics;  Laterality: Left;  Left Leg Irrigation and Debridement, Place Skin Graft, Wound VAC, [place]  . skin grafts    . SKIN SPLIT GRAFT  03/13/2012   Procedure: SKIN GRAFT SPLIT THICKNESS;  Surgeon: [name], MD;  Location: [facility];  Service: Orthopedics;  Laterality: Left;  Excisional Debridement Left Leg, Split Thickness Skin Graft,  Wound VAC  . stomach stapling    . tummy tuck    . VEIN LIGATION AND STRIPPING     Past Medical History:  Diagnosis Date  . Anemia   . Asthma   . Cancer ([place])    hx basal cell skin cancer  . CHF (congestive heart failure) ([place])    does not see a cardiologist  . COPD (chronic obstructive pulmonary disease) ([place])    3L continuous  . Diabetes mellitus    not on medication  . DVT (deep venous thrombosis) ([facility])    LLE DVT '80's  . GERD (gastroesophageal reflux disease)   . Headache(784.0)    sinus headaches  . Leg pain   . Peripheral neuropathy   . Peripheral vascular disease ([place])   . Psoriasis   . Shortness of breath   . Sleep apnea    uses bipap, sleep study done in [place] 2 years ago, dr [name]   BP 125/70   Pulse 70   SpO2 97%   Opioid Risk Score:  0 Fall Risk Score:  `1  Depression screen PHQ 2/9  Depression screen [facility] 2/9 04/18/2017 12/30/2016 11/04/2016 01/04/2016 09/19/2015 07/19/2015 12/30/2014  Decreased Interest 0 0 0 0 0 2 0  Down, Depressed, Hopeless 0 0 0 0 0 1 0  PHQ - 2 Score 0 0 0 0 0 3 0  Altered sleeping - - - - - 2 -  Tired, decreased energy - - - - - 3 -  Change in appetite - - - - - 3 -  Feeling bad or failure about yourself  - - - - - 3 -  Trouble concentrating - - - - - 0 -  Moving slowly or fidgety/restless - - - - - 0 -  Suicidal thoughts - - - - - 1 -  PHQ-9 Score - - - - - 15 -  Difficult doing work/chores - - - - - Somewhat difficult -      Review of Systems  Constitutional: Negative.   HENT: Negative.   Eyes: Negative.   Respiratory: Negative.   Cardiovascular: Negative.   Gastrointestinal: Negative.   Endocrine: Negative.   Genitourinary: Negative.   Musculoskeletal: Negative.   Skin: Negative.   Allergic/Immunologic: Negative.   Neurological: Negative.   Hematological: Negative.   Psychiatric/Behavioral: Negative.   All other systems reviewed and are negative.      Objective:   Physical Exam  Constitutional: He  is oriented to person, place, and time. He appears well-developed and well-nourished.  HENT:  Head: Normocephalic and atraumatic.  Neck: Normal range of motion. Neck supple.  Cardiovascular: Normal rate and regular rhythm.  Pulmonary/Chest: Effort normal and breath sounds  normal.  Continuous Oxygen at 3 liters nasal cannula  Musculoskeletal:  Normal Muscle Bulk and Muscle Testing Reveals:  Upper Extremities: Full ROM and Muscle Strength 5/5 Back without Spinal Tenderness Lower Extremities: Right: Full ROM and Muscle Strength 5/5 Left: BKA Arrived in wheelchair  Neurological: He is alert and oriented to person, place, and time.  Skin: Skin is warm and dry.  Psychiatric: He has a normal mood and affect.  Nursing note and vitals reviewed.         Assessment & Plan:  1.Left BKA/ [place] Pain : Refilled: Oxycontin 15 mg one tablet every 12 hours#60 and Refilled: Increased: oxycodone 10 mg one tablet every 6hours as needed # 120 tablets. 04/18/2017 We will continue the opioid monitoring program, this consists of regular clinic visits, examinations, urine drug screen, pill counts as well as use of [place] Controlled Substance Reporting System. Continue: Topamax 50 mg one tablet at HS and Amitriptyline..  2. Chronic Bilateral Low Back Pain without Sciatica/ Chronic  pain syndrome related to his chronic left leg pain: Continue Current medication Regime. 04/18/2017 3. Hx of spinal stimulator. Continue to Monitor. 04/18/2017 4. COPD: On Continuous Oxygen Therapy @ 3 liters Nasal Cannula. Pulmonology Following . 04/18/2017 5. Depression: Continue: Cymbalta. 04/18/2017 6. Sacral Decubitus Stage 3: Increase Oxycodone tablets/ Continue at the [facility], awaiting paperwork). 04/18/2017  20 minutes of face to face patient care time was spent during this visit. All questions were encouraged and answered.   F/U in 1 month

## 2017-05-21 ENCOUNTER — Encounter: Payer: Self-pay | Admitting: Registered Nurse

## 2017-05-21 ENCOUNTER — Other Ambulatory Visit: Payer: Self-pay

## 2017-05-21 ENCOUNTER — Encounter: Payer: Medicare Other | Attending: Physical Medicine & Rehabilitation | Admitting: Registered Nurse

## 2017-05-21 VITALS — BP 131/71 | HR 77

## 2017-05-21 DIAGNOSIS — S88112S Complete traumatic amputation at level between knee and ankle, left lower leg, sequela: Secondary | ICD-10-CM | POA: Diagnosis present

## 2017-05-21 DIAGNOSIS — Z79899 Other long term (current) drug therapy: Secondary | ICD-10-CM | POA: Insufficient documentation

## 2017-05-21 DIAGNOSIS — G894 Chronic pain syndrome: Secondary | ICD-10-CM | POA: Diagnosis present

## 2017-05-21 DIAGNOSIS — Z5181 Encounter for therapeutic drug level monitoring: Secondary | ICD-10-CM | POA: Diagnosis present

## 2017-05-21 DIAGNOSIS — R5381 Other malaise: Secondary | ICD-10-CM | POA: Diagnosis present

## 2017-05-21 DIAGNOSIS — M545 Low back pain, unspecified: Secondary | ICD-10-CM

## 2017-05-21 DIAGNOSIS — L89153 Pressure ulcer of sacral region, stage 3: Secondary | ICD-10-CM

## 2017-05-21 DIAGNOSIS — E1151 Type 2 diabetes mellitus with diabetic peripheral angiopathy without gangrene: Secondary | ICD-10-CM | POA: Insufficient documentation

## 2017-05-21 DIAGNOSIS — G8929 Other chronic pain: Secondary | ICD-10-CM

## 2017-05-21 DIAGNOSIS — G546 Phantom limb syndrome with pain: Secondary | ICD-10-CM

## 2017-05-21 MED ORDER — OXYCODONE HCL 10 MG PO TABS: 10.0000 mg | ORAL_TABLET | Freq: Four times a day (QID) | ORAL | 0 refills | Status: DC | PRN

## 2017-05-21 MED ORDER — OXYCODONE HCL ER 15 MG PO T12A: 15.0000 mg | EXTENDED_RELEASE_TABLET | Freq: Two times a day (BID) | ORAL | 0 refills | Status: DC

## 2017-05-21 NOTE — Progress Notes (Signed)
Subjective:    Patient ID: Jeremy [name], male    DOB: Jun 03, 1950, 67 y.o.   MRN: [id number]  HPI: Jeremy [name] is a 67 year old male who returns for follow up appointmentfor chronic pain and medication refill. He states his pain is located in his lower back, left stump ( phantom pain) and sacrum pain ( decubitus).  Jeremy [name] has a Stage 3 sacral decubitus with Tunneling, he is going to the [place].He rates his pain 4. His current exercise regime is performing stretching exercises. He arrived in his wheelchair.  Jeremy [name] MME is 97.00  Jeremy [name] Oral Swab was performed on 11/04/2016, it was consistent. Oral Swab was Performed Today.   Pain Inventory Average Pain 4 Pain Right Now 4 My pain is sharp, stabbing and aching  In the last 24 hours, has pain interfered with the following? General activity 7 Relation with others 6 Enjoyment of life 6 What TIME of day is your pain at its worst? daytime Sleep (in general) Fair  Pain is worse with: some activites Pain improves with: medication Relief from Meds: 9  Mobility use a walker ability to climb steps?  no do you drive?  yes use a wheelchair needs help with transfers  Function retired I need assistance with the following:  meal prep, household duties and shopping  Neuro/Psych No problems in this area  Prior Studies Any changes since last visit?  no  Physicians involved in your care Any changes since last visit?  no   Family History  Problem Relation Age of Onset  . Heart disease Brother    Social History   Socioeconomic History  . Marital status: Married    Spouse name: None  . Number of children: None  . Years of education: None  . Highest education level: None  Social Needs  . Financial resource strain: None  . Food insecurity - worry: None  . Food insecurity - inability: None  . Transportation needs - medical: None  . Transportation needs - non-medical: None  Occupational History  . None    Tobacco Use  . Smoking status: Former Smoker    Packs/day: 0.50    Years: 6.00    Pack years: 3.00    Types: Cigarettes    Last attempt to quit: 12/03/1990    Years since quitting: 26.4  . Smokeless tobacco: Never Used  Substance and Sexual Activity  . Alcohol use: Yes    Alcohol/week: 0.0 oz    Comment: rare  . Drug use: Yes  . Sexual activity: Not Currently  Other Topics Concern  . None  Social History Narrative  . None   Past Surgical History:  Procedure Laterality Date  . AMPUTATION Left 11/25/2012   Procedure: AMPUTATION BELOW KNEE;  Surgeon: [name], MD;  Location: [place];  Service: Orthopedics;  Laterality: Left;  Left Below Knee Amputation  . APLIGRAFT PLACEMENT Left 07/31/2012   Procedure: Apply Skin Graft;  Surgeon: [name], MD;  Location: [facility];  Service: Orthopedics;  Laterality: Left;  . APPLICATION OF WOUND VAC Left 07/31/2012   Procedure: APPLICATION OF WOUND VAC;  Surgeon: [name], MD;  Location: [facility];  Service: Orthopedics;  Laterality: Left;  . arm surgery    . back stimulator    . CARDIAC CATHETERIZATION     Hx: of " around 1985 at [facility]"  . CYST EXCISION     Hx: of  . I&D EXTREMITY  03/13/2011   Procedure: IRRIGATION AND DEBRIDEMENT EXTREMITY;  Surgeon: [name], MD;  Location: [place];  Service: Orthopedics;  Laterality: Left;  . I&D EXTREMITY Left 11/06/2012   Procedure: IRRIGATION AND DEBRIDEMENT EXTREMITY-Left lower ;  Surgeon: [name], MD;  Location: [facility];  Service: Orthopedics;  Laterality: Left;  Left Leg Irrigation and Debridement, Place Skin Graft, Wound VAC, [place]  . skin grafts    . SKIN SPLIT GRAFT  03/13/2012   Procedure: SKIN GRAFT SPLIT THICKNESS;  Surgeon: [name], MD;  Location: [place];  Service: Orthopedics;  Laterality: Left;  Excisional Debridement Left Leg, Split Thickness Skin Graft, Wound VAC  . stomach stapling    . tummy tuck    . VEIN LIGATION AND STRIPPING     Past Medical History:  Diagnosis  Date  . Anemia   . Asthma   . Cancer ([place])    hx basal cell skin cancer  . CHF (congestive heart failure) ([place])    does not see a cardiologist  . COPD (chronic obstructive pulmonary disease) ([place])    3L continuous  . Diabetes mellitus    not on medication  . DVT (deep venous thrombosis) ([facility])    LLE DVT '80's  . GERD (gastroesophageal reflux disease)   . Headache(784.0)    sinus headaches  . Leg pain   . Peripheral neuropathy   . Peripheral vascular disease ([place])   . Psoriasis   . Shortness of breath   . Sleep apnea    uses bipap, sleep study done in [place] 2 years ago, dr [name]   BP 131/71   Pulse 77   SpO2 96% Comment: 3L [place]  Opioid Risk Score:  0 Fall Risk Score:  `1  Depression screen PHQ 2/9  Depression screen [facility] 2/9 05/21/2017 04/18/2017 12/30/2016 11/04/2016 01/04/2016 09/19/2015 07/19/2015  Decreased Interest 0 0 0 0 0 0 2  Down, Depressed, Hopeless 0 0 0 0 0 0 1  PHQ - 2 Score 0 0 0 0 0 0 3  Altered sleeping - - - - - - 2  Tired, decreased energy - - - - - - 3  Change in appetite - - - - - - 3  Feeling bad or failure about yourself  - - - - - - 3  Trouble concentrating - - - - - - 0  Moving slowly or fidgety/restless - - - - - - 0  Suicidal thoughts - - - - - - 1  PHQ-9 Score - - - - - - 15  Difficult doing work/chores - - - - - - Somewhat difficult      Review of Systems  Constitutional: Negative.   HENT: Negative.   Eyes: Negative.   Respiratory: Negative.   Cardiovascular: Positive for leg swelling.  Gastrointestinal: Negative.   Endocrine: Negative.   Genitourinary: Negative.   Musculoskeletal: Negative.   Skin: Negative.   Allergic/Immunologic: Negative.   Neurological: Negative.   Hematological: Negative.   Psychiatric/Behavioral: Negative.   All other systems reviewed and are negative.      Objective:   Physical Exam  Constitutional: He is oriented to person, place, and time. He appears well-developed and well-nourished.    HENT:  Head: Normocephalic and atraumatic.  Neck: Normal range of motion. Neck supple.  Cardiovascular: Normal rate and regular rhythm.  Pulmonary/Chest: Effort normal and breath sounds normal.  Continuous Oxygen at 3 liters nasal cannula  Musculoskeletal:  Normal Muscle Bulk and Muscle  Testing Reveals:  Upper Extremities: Full  ROM and Muscle Strength 5/5 Back without spinal tenderness noted  Lower Extremities: Right: Full ROM and Muscle Strength 5/5 Left: BKA Arrived in wheelchair  Neurological: He is alert and oriented to person, place, and time.  Skin: Skin is warm and dry.  Psychiatric: He has a normal mood and affect.  Nursing note and vitals reviewed.         Assessment & Plan:  1.Left BKA/ [place] Pain : Refilled: Oxycontin 15 mg one tablet every 12 hours#60 and Refilled: oxycodone 10 mg one tablet every 6hours as needed # 120 tablets. 05/21/2017 We will continue the opioid monitoring program, this consists of regular clinic visits, examinations, urine drug screen, pill counts as well as use of [place] Controlled Substance Reporting System. Continue: Topamax 50 mg one tablet at HS and Amitriptyline..  2. Chronic Bilateral Low Back Pain without Sciatica/ Chronic  pain syndrome related to his chronic left leg pain: Continue Current medication Regime. 05/21/2017 3. Hx of spinal stimulator. Continue to Monitor. 05/21/2017 4. COPD: On Continuous Oxygen Therapy @ 3 liters Nasal Cannula. Pulmonology Following. 05/21/2017 5. Depression: Continue: Cymbalta. 05/21/2017 6. Sacral Decubitus Stage 3: Continue Oxycodone tablets/ Continue at the [facility], awaiting paperwork). 05/21/2017  20 minutes of face to face patient care time was spent during this visit. All questions were encouraged and answered.   F/U in 1 month

## 2017-05-26 LAB — DRUG TOX MONITOR 1 W/CONF, ORAL FLD
AMPHETAMINES: NEGATIVE ng/mL (ref <10)
BENZODIAZEPINES: NEGATIVE ng/mL (ref <0.50)
BUPRENORPHINE: NEGATIVE ng/mL (ref <0.10)
Barbiturates: NEGATIVE ng/mL (ref <10)
Cocaine: NEGATIVE ng/mL (ref <5.0)
Codeine: NEGATIVE ng/mL (ref <2.5)
Dihydrocodeine: NEGATIVE ng/mL (ref <2.5)
Fentanyl: NEGATIVE ng/mL (ref <0.10)
HYDROCODONE: NEGATIVE ng/mL (ref <2.5)
Heroin Metabolite: NEGATIVE ng/mL (ref <1.0)
Hydromorphone: NEGATIVE ng/mL (ref <2.5)
MARIJUANA: NEGATIVE ng/mL (ref <2.5)
MDMA: NEGATIVE ng/mL (ref <10)
MEPROBAMATE: NEGATIVE ng/mL (ref <2.5)
METHADONE: NEGATIVE ng/mL (ref <5.0)
Morphine: NEGATIVE ng/mL (ref <2.5)
NICOTINE METABOLITE: NEGATIVE ng/mL (ref <5.0)
NORHYDROCODONE: NEGATIVE ng/mL (ref <2.5)
Noroxycodone: 96.9 ng/mL — ABNORMAL HIGH (ref <2.5)
OPIATES: POSITIVE ng/mL — AB (ref <2.5)
Oxymorphone: NEGATIVE ng/mL (ref <2.5)
Phencyclidine: NEGATIVE ng/mL (ref <10)
TAPENTADOL: NEGATIVE ng/mL (ref <5.0)
TRAMADOL: NEGATIVE ng/mL (ref <5.0)
Zolpidem: NEGATIVE ng/mL (ref <5.0)

## 2017-05-26 LAB — DRUG TOX ALC METAB W/CON, ORAL FLD: ALCOHOL METABOLITE: NEGATIVE ng/mL (ref <25)

## 2017-05-27 ENCOUNTER — Telehealth: Payer: Self-pay | Admitting: *Deleted

## 2017-05-27 NOTE — Telephone Encounter (Signed)
Oral swab drug screen was consistent for prescribed medications.  ?

## 2017-06-10 ENCOUNTER — Encounter: Payer: Medicare Other | Attending: Physical Medicine & Rehabilitation | Admitting: Registered Nurse

## 2017-06-10 ENCOUNTER — Encounter: Payer: Self-pay | Admitting: Registered Nurse

## 2017-06-10 VITALS — BP 151/72 | HR 64

## 2017-06-10 DIAGNOSIS — Z5181 Encounter for therapeutic drug level monitoring: Secondary | ICD-10-CM | POA: Diagnosis present

## 2017-06-10 DIAGNOSIS — S88112S Complete traumatic amputation at level between knee and ankle, left lower leg, sequela: Secondary | ICD-10-CM | POA: Diagnosis not present

## 2017-06-10 DIAGNOSIS — E1151 Type 2 diabetes mellitus with diabetic peripheral angiopathy without gangrene: Secondary | ICD-10-CM | POA: Diagnosis present

## 2017-06-10 DIAGNOSIS — M545 Low back pain, unspecified: Secondary | ICD-10-CM

## 2017-06-10 DIAGNOSIS — G894 Chronic pain syndrome: Secondary | ICD-10-CM | POA: Diagnosis not present

## 2017-06-10 DIAGNOSIS — R5381 Other malaise: Secondary | ICD-10-CM | POA: Insufficient documentation

## 2017-06-10 DIAGNOSIS — L89153 Pressure ulcer of sacral region, stage 3: Secondary | ICD-10-CM | POA: Diagnosis not present

## 2017-06-10 DIAGNOSIS — G546 Phantom limb syndrome with pain: Secondary | ICD-10-CM | POA: Diagnosis not present

## 2017-06-10 DIAGNOSIS — G8929 Other chronic pain: Secondary | ICD-10-CM | POA: Diagnosis not present

## 2017-06-10 DIAGNOSIS — Z79899 Other long term (current) drug therapy: Secondary | ICD-10-CM | POA: Diagnosis present

## 2017-06-10 MED ORDER — OXYCODONE HCL ER 15 MG PO T12A: 15.0000 mg | EXTENDED_RELEASE_TABLET | Freq: Two times a day (BID) | ORAL | 0 refills | Status: DC

## 2017-06-10 MED ORDER — OXYCODONE HCL 10 MG PO TABS: 10.0000 mg | ORAL_TABLET | Freq: Four times a day (QID) | ORAL | 0 refills | Status: DC | PRN

## 2017-06-10 NOTE — Progress Notes (Signed)
Subjective:    Patient ID: Jeremy [name], male    DOB: Jul 12, 1950, 67 y.o.   MRN: [id number]  HPI: [name] is a 67 year old male who returns for follow up appointmentfor chronic pain and medication refill. He states his pain is located in his sacrum ( decubitus), lower back and left phantom pain at times.  [name] has a Stage 3 sacral decubitus with Tunneling, he is going to the [place].He rates his pain 1. His current exercise regime is performing stretching exercises. He arrived in his wheelchair.  [name] MME is 107.00   Oral Swab was Performed on 05/21/2017, it was consistent.    Pain Inventory Average Pain 7 Pain Right Now 1 My pain is sharp, stabbing and aching  In the last 24 hours, has pain interfered with the following? General activity 6 Relation with others 3 Enjoyment of life 2 What TIME of day is your pain at its worst? morning Sleep (in general) Poor  Pain is worse with: other Pain improves with: medication Relief from Meds: 9  Mobility use a walker ability to climb steps?  no do you drive?  yes use a wheelchair needs help with transfers Do you have any goals in this area?  no  Function retired Do you have any goals in this area?  no  Neuro/Psych No problems in this area  Prior Studies Any changes since last visit?  no  Physicians involved in your care Any changes since last visit?  no   Family History  Problem Relation Age of Onset  . Heart disease Brother    Social History   Socioeconomic History  . Marital status: Married    Spouse name: Not on file  . Number of children: Not on file  . Years of education: Not on file  . Highest education level: Not on file  Social Needs  . Financial resource strain: Not on file  . Food insecurity - worry: Not on file  . Food insecurity - inability: Not on file  . Transportation needs - medical: Not on file  . Transportation needs - non-medical: Not on file  Occupational  History  . Not on file  Tobacco Use  . Smoking status: Former Smoker    Packs/day: 0.50    Years: 6.00    Pack years: 3.00    Types: Cigarettes    Last attempt to quit: 12/03/1990    Years since quitting: 26.5  . Smokeless tobacco: Never Used  Substance and Sexual Activity  . Alcohol use: Yes    Alcohol/week: 0.0 oz    Comment: rare  . Drug use: Yes  . Sexual activity: Not Currently  Other Topics Concern  . Not on file  Social History Narrative  . Not on file   Past Surgical History:  Procedure Laterality Date  . AMPUTATION Left 11/25/2012   Procedure: AMPUTATION BELOW KNEE;  Surgeon: [name], MD;  Location: [place];  Service: Orthopedics;  Laterality: Left;  Left Below Knee Amputation  . APLIGRAFT PLACEMENT Left 07/31/2012   Procedure: Apply Skin Graft;  Surgeon: [name], MD;  Location: [place];  Service: Orthopedics;  Laterality: Left;  . APPLICATION OF WOUND VAC Left 07/31/2012   Procedure: APPLICATION OF WOUND VAC;  Surgeon: [name], MD;  Location: [facility];  Service: Orthopedics;  Laterality: Left;  . arm surgery    . back stimulator    . CARDIAC CATHETERIZATION  Hx: of " around 1985 at [facility]"  . CYST EXCISION     Hx: of  . I&D EXTREMITY  03/13/2011   Procedure: IRRIGATION AND DEBRIDEMENT EXTREMITY;  Surgeon: [name], MD;  Location: [place];  Service: Orthopedics;  Laterality: Left;  . I&D EXTREMITY Left 11/06/2012   Procedure: IRRIGATION AND DEBRIDEMENT EXTREMITY-Left lower ;  Surgeon: [name], MD;  Location: [facility] [place];  Service: Orthopedics;  Laterality: Left;  Left Leg Irrigation and Debridement, Place Skin Graft, Wound VAC, [place]  . skin grafts    . SKIN SPLIT GRAFT  03/13/2012   Procedure: SKIN GRAFT SPLIT THICKNESS;  Surgeon: [name], MD;  Location: [place];  Service: Orthopedics;  Laterality: Left;  Excisional Debridement Left Leg, Split Thickness Skin Graft, Wound VAC  . stomach stapling    . tummy tuck    . VEIN LIGATION AND STRIPPING       Past Medical History:  Diagnosis Date  . Anemia   . Asthma   . Cancer ([place])    hx basal cell skin cancer  . CHF (congestive heart failure) ([place])    does not see a cardiologist  . COPD (chronic obstructive pulmonary disease) ([place])    3L continuous  . Diabetes mellitus    not on medication  . DVT (deep venous thrombosis) ([facility])    LLE DVT '80's  . GERD (gastroesophageal reflux disease)   . Headache(784.0)    sinus headaches  . Leg pain   . Peripheral neuropathy   . Peripheral vascular disease ([place])   . Psoriasis   . Shortness of breath   . Sleep apnea    uses bipap, sleep study done in [place] 2 years ago, dr [name]   There were no vitals taken for this visit.  Opioid Risk Score:  0 Fall Risk Score:  `1  Depression screen PHQ 2/9  Depression screen [facility] 2/9 05/21/2017 04/18/2017 12/30/2016 11/04/2016 01/04/2016 09/19/2015 07/19/2015  Decreased Interest 0 0 0 0 0 0 2  Down, Depressed, Hopeless 0 0 0 0 0 0 1  PHQ - 2 Score 0 0 0 0 0 0 3  Altered sleeping - - - - - - 2  Tired, decreased energy - - - - - - 3  Change in appetite - - - - - - 3  Feeling bad or failure about yourself  - - - - - - 3  Trouble concentrating - - - - - - 0  Moving slowly or fidgety/restless - - - - - - 0  Suicidal thoughts - - - - - - 1  PHQ-9 Score - - - - - - 15  Difficult doing work/chores - - - - - - Somewhat difficult      Review of Systems  Constitutional: Negative.   HENT: Negative.   Eyes: Negative.   Respiratory: Negative.   Cardiovascular: Positive for leg swelling.  Gastrointestinal: Negative.   Endocrine: Negative.   Genitourinary: Negative.   Musculoskeletal: Negative.   Skin: Negative.   Allergic/Immunologic: Negative.   Neurological: Negative.   Hematological: Negative.   Psychiatric/Behavioral: Negative.   All other systems reviewed and are negative.      Objective:   Physical Exam  Constitutional: He is oriented to person, place, and time. He appears  well-developed and well-nourished.  HENT:  Head: Normocephalic and atraumatic.  Neck: Normal range of motion. Neck supple.  Cardiovascular: Normal rate and regular rhythm.  Pulmonary/Chest: Effort normal and breath sounds  normal.  Continuous Oxygen at 3 liters nasal cannula  Musculoskeletal:  Normal Muscle Bulk and Muscle Testing Reveals:  Upper Extremities: Full  ROM and Muscle Strength 5/5B Back  without spinal tenderness noted  Lower Extremities: Right: Full ROM and Muscle Strength 5/5 Left: BKA Arrived in wheelchair  Neurological: He is alert and oriented to person, place, and time.  Skin: Skin is warm and dry.  Psychiatric: He has a normal mood and affect.  Nursing note and vitals reviewed.         Assessment & Plan:  1.Left BKA/ [place] Pain : Refilled: Oxycontin 15 mg one tablet every 12 hours#60 and Refilled: oxycodone 10 mg one tablet every 6hours as needed # 120 tablets. 06/10/2017 We will continue the opioid monitoring program, this consists of regular clinic visits, examinations, urine drug screen, pill counts as well as use of [place] Controlled Substance Reporting System. Continue: Topamax 50 mg one tablet at HS and Amitriptyline..  2. Chronic Bilateral Low Back Pain without Sciatica/ Chronic  pain syndrome related to his chronic left leg pain: No complaints Today .Continue Current medication Regime. 06/10/2017 3. Hx of spinal stimulator. Continue to Monitor. 06/10/2017 4. COPD: On Continuous Oxygen Therapy @ 3 liters Nasal Cannula. Pulmonology Following. 06/10/2017 5. Depression: Continue: Cymbalta. 06/10/2017 6. Sacral Decubitus Stage 3: Continue Oxycodone tablets/ Continue at the [facility], awaiting paperwork). 06/10/2017  20 minutes of face to face patient care time was spent during this visit. All questions were encouraged and answered.   F/U in 1 month

## 2017-06-11 ENCOUNTER — Encounter: Payer: Medicare Other | Admitting: Registered Nurse

## 2017-06-17 ENCOUNTER — Other Ambulatory Visit: Payer: Self-pay | Admitting: Registered Nurse

## 2017-06-18 ENCOUNTER — Ambulatory Visit: Payer: Medicare Other | Admitting: Registered Nurse

## 2017-07-09 ENCOUNTER — Other Ambulatory Visit: Payer: Self-pay | Admitting: Physical Medicine & Rehabilitation

## 2017-07-09 DIAGNOSIS — G546 Phantom limb syndrome with pain: Secondary | ICD-10-CM

## 2017-07-09 DIAGNOSIS — S88112S Complete traumatic amputation at level between knee and ankle, left lower leg, sequela: Secondary | ICD-10-CM

## 2017-07-09 DIAGNOSIS — G894 Chronic pain syndrome: Secondary | ICD-10-CM

## 2017-07-09 NOTE — Telephone Encounter (Signed)
Patient is calling to get a refill sent to [facility] on his Oxycontin.  His sacral wound is getting worse and the medication is only lasting him 4 to 4 1/2 hours.  Please call patient when this is done.

## 2017-07-10 MED ORDER — OXYCODONE HCL ER 15 MG PO T12A: 15.0000 mg | EXTENDED_RELEASE_TABLET | Freq: Two times a day (BID) | ORAL | 0 refills | Status: DC

## 2017-07-10 NOTE — Telephone Encounter (Signed)
Placed a call to Mr. Hodes,  According to the PMP Aware Website he pickedup his Oxycontin on 06/14/2017, his Oxycodone on 06/21/2017. He has a scheduled appointment with Dr. Naaman Plummer on 07/16/2017. I will e-scribe his Oxycontin today, he verbalizes understanding.

## 2017-07-16 ENCOUNTER — Other Ambulatory Visit: Payer: Self-pay

## 2017-07-16 ENCOUNTER — Encounter: Payer: Self-pay | Admitting: Registered Nurse

## 2017-07-16 ENCOUNTER — Encounter: Payer: Medicare Other | Attending: Physical Medicine & Rehabilitation | Admitting: Registered Nurse

## 2017-07-16 VITALS — BP 111/69 | HR 64 | Ht 70.0 in | Wt 310.0 lb

## 2017-07-16 DIAGNOSIS — Z5181 Encounter for therapeutic drug level monitoring: Secondary | ICD-10-CM | POA: Insufficient documentation

## 2017-07-16 DIAGNOSIS — L89153 Pressure ulcer of sacral region, stage 3: Secondary | ICD-10-CM

## 2017-07-16 DIAGNOSIS — G8929 Other chronic pain: Secondary | ICD-10-CM | POA: Diagnosis not present

## 2017-07-16 DIAGNOSIS — M545 Low back pain, unspecified: Secondary | ICD-10-CM

## 2017-07-16 DIAGNOSIS — R5381 Other malaise: Secondary | ICD-10-CM | POA: Diagnosis present

## 2017-07-16 DIAGNOSIS — Z79899 Other long term (current) drug therapy: Secondary | ICD-10-CM | POA: Diagnosis present

## 2017-07-16 DIAGNOSIS — G894 Chronic pain syndrome: Secondary | ICD-10-CM | POA: Diagnosis not present

## 2017-07-16 DIAGNOSIS — S88112S Complete traumatic amputation at level between knee and ankle, left lower leg, sequela: Secondary | ICD-10-CM | POA: Insufficient documentation

## 2017-07-16 DIAGNOSIS — G546 Phantom limb syndrome with pain: Secondary | ICD-10-CM

## 2017-07-16 DIAGNOSIS — E1151 Type 2 diabetes mellitus with diabetic peripheral angiopathy without gangrene: Secondary | ICD-10-CM | POA: Diagnosis present

## 2017-07-16 MED ORDER — OXYCODONE HCL 10 MG PO TABS: 10.0000 mg | ORAL_TABLET | Freq: Four times a day (QID) | ORAL | 0 refills | Status: DC | PRN

## 2017-07-16 MED ORDER — OXYCODONE HCL ER 15 MG PO T12A: 15.0000 mg | EXTENDED_RELEASE_TABLET | Freq: Two times a day (BID) | ORAL | 0 refills | Status: DC

## 2017-07-16 MED ORDER — AMITRIPTYLINE HCL 25 MG PO TABS: 25.0000 mg | ORAL_TABLET | Freq: Every day | ORAL | 0 refills | Status: DC

## 2017-07-16 NOTE — Progress Notes (Signed)
Subjective:    Patient ID: Jeremy [name], male    DOB: 1950/09/10, 67 y.o.   MRN: [id number]  HPI: [name] is a 67 year old male who returns for follow up appointmentfor chronic pain and medication refill. He states his pain is located in his sacrum ( decubitus) sacral dressing intact, lower back pain occasionally and occipital scalp pain ( skin cancer was removed he reports). .  Jeremy [name] receiving wound care from [facility] weekly, also states his wife changing his dressing every 2-3 days. He rates his pain 1.. His current exercise regime is performing stretching exercises. He arrived in his wheelchair.  Jeremy [name] MME is 112.00   Oral Swab was Performed on 05/21/2017, it was consistent.    Pain Inventory Average Pain 6 Pain Right Now 1 My pain is sharp, stabbing and aching  In the last 24 hours, has pain interfered with the following? General activity 6 Relation with others 1 Enjoyment of life 7 What TIME of day is your pain at its worst? night Sleep (in general) Poor  Pain is worse with: inactivity Pain improves with: medication Relief from Meds: 9  Mobility use a walker ability to climb steps?  no do you drive?  yes use a wheelchair needs help with transfers Do you have any goals in this area?  no  Function retired Do you have any goals in this area?  no  Neuro/Psych No problems in this area  Prior Studies Any changes since last visit?  no  Physicians involved in your care Any changes since last visit?  no   Family History  Problem Relation Age of Onset  . Heart disease Brother    Social History   Socioeconomic History  . Marital status: Married    Spouse name: Jeremy [name]  . Number of children: Jeremy [name]  . Years of education: Jeremy [name]  . Highest education level: Jeremy [name]  Occupational History  . Jeremy [name]  Social Needs  . Financial resource strain: Jeremy [name]  . Food insecurity:    Worry: Jeremy [name]    Inability: Jeremy  on [name]  . Transportation needs:    Medical: Jeremy [name]    Non-medical: Jeremy [name]  Tobacco Use  . Smoking status: Former Smoker    Packs/day: 0.50    Years: 6.00    Pack years: 3.00    Types: Cigarettes    Last attempt to quit: 12/03/1990    Years since quitting: 26.6  . Smokeless tobacco: Never Used  Substance and Sexual Activity  . Alcohol use: Yes    Alcohol/week: 0.0 oz    Comment: rare  . Drug use: Yes  . Sexual activity: Jeremy Currently  Lifestyle  . Physical activity:    Days per week: Jeremy [name]    Minutes per session: Jeremy [name]  . Stress: Jeremy [name]  Relationships  . Social connections:    Talks on phone: Jeremy [name]    Gets together: Jeremy [name]    Attends religious service: Jeremy [name]    Active member of club or organization: Jeremy [name]    Attends meetings of clubs or organizations: Jeremy [name]    Relationship status: Jeremy [name]  Other Topics Concern  . Jeremy [name]  Social History Narrative  . Jeremy [name]   Past Surgical History:  Procedure Laterality Date  .  AMPUTATION Left 11/25/2012   Procedure: AMPUTATION BELOW KNEE;  Surgeon: Jeremy Minion, MD;  Location: [place];  Service: Orthopedics;  Laterality: Left;  Left Below Knee Amputation  . APLIGRAFT PLACEMENT Left 07/31/2012   Procedure: Apply Skin Graft;  Surgeon: Jeremy Minion, MD;  Location: [place];  Service: Orthopedics;  Laterality: Left;  . APPLICATION OF WOUND VAC Left 07/31/2012   Procedure: APPLICATION OF WOUND VAC;  Surgeon: Jeremy Minion, MD;  Location: [place];  Service: Orthopedics;  Laterality: Left;  . arm surgery    . back stimulator    . CARDIAC CATHETERIZATION     Hx: of " around 1985 at [facility]"  . CYST EXCISION     Hx: of  . I&D EXTREMITY  03/13/2011   Procedure: IRRIGATION AND DEBRIDEMENT EXTREMITY;  Surgeon: Jeremy Minion, MD;  Location: [place];  Service: Orthopedics;  Laterality: Left;  . I&D EXTREMITY Left 11/06/2012   Procedure: IRRIGATION AND DEBRIDEMENT EXTREMITY-Left lower ;   Surgeon: Jeremy Minion, MD;  Location: [place];  Service: Orthopedics;  Laterality: Left;  Left Leg Irrigation and Debridement, Place Skin Graft, Wound VAC, [place]  . skin grafts    . SKIN SPLIT GRAFT  03/13/2012   Procedure: SKIN GRAFT SPLIT THICKNESS;  Surgeon: Jeremy Minion, MD;  Location: [place];  Service: Orthopedics;  Laterality: Left;  Excisional Debridement Left Leg, Split Thickness Skin Graft, Wound VAC  . stomach stapling    . tummy tuck    . VEIN LIGATION AND STRIPPING     Past Medical History:  Diagnosis Date  . Anemia   . Asthma   . Cancer ([place])    hx basal cell skin cancer  . CHF (congestive heart failure) ([place])    does Jeremy see a cardiologist  . COPD (chronic obstructive pulmonary disease) ([place])    3L continuous  . Diabetes mellitus    Jeremy on medication  . DVT (deep venous thrombosis) ([facility])    LLE DVT '80's  . GERD (gastroesophageal reflux disease)   . Headache(784.0)    sinus headaches  . Leg pain   . Peripheral neuropathy   . Peripheral vascular disease ([place])   . Psoriasis   . Shortness of breath   . Sleep apnea    uses bipap, sleep study done in [place] 2 years ago, dr Jeremy [name]   BP 111/69   Pulse 64   Ht 5\' 10"  (1.778 m) Comment: pt reported  Wt (!) 310 lb (140.6 kg) Comment: pt reported  SpO2 (!) 62%   BMI 44.48 kg/m   Opioid Risk Score:  0 Fall Risk Score:  `1  Depression screen PHQ 2/9  Depression screen [facility] 2/9 05/21/2017 04/18/2017 12/30/2016 11/04/2016 01/04/2016 09/19/2015 07/19/2015  Decreased Interest 0 0 0 0 0 0 2  Down, Depressed, Hopeless 0 0 0 0 0 0 1  PHQ - 2 Score 0 0 0 0 0 0 3  Altered sleeping - - - - - - 2  Tired, decreased energy - - - - - - 3  Change in appetite - - - - - - 3  Feeling bad or failure about yourself  - - - - - - 3  Trouble concentrating - - - - - - 0  Moving slowly or fidgety/restless - - - - - - 0  Suicidal thoughts - - - - - - 1  PHQ-9 Score - - - - - - 15  Difficult doing work/chores - - - - - -  Somewhat  difficult      Review of Systems  Constitutional: Negative.   HENT: Negative.   Eyes: Negative.   Respiratory: Negative.   Cardiovascular: Positive for leg swelling.  Gastrointestinal: Negative.   Endocrine: Negative.   Genitourinary: Negative.   Musculoskeletal: Negative.   Skin: Negative.   Allergic/Immunologic: Negative.   Neurological: Negative.   Hematological: Negative.   Psychiatric/Behavioral: Negative.   All other systems reviewed and are negative.      Objective:   Physical Exam  Constitutional: He is oriented to person, place, and time. He appears well-developed and well-nourished.  HENT:  Head: Normocephalic and atraumatic.  Occipital dressing intact  Neck: Normal range of motion. Neck supple.  Cardiovascular: Normal rate and regular rhythm.  Pulmonary/Chest: Effort normal and breath sounds normal.  Continuous Oxygen at 3 liters nasal cannula  Musculoskeletal:  Normal Muscle Bulk and Muscle Testing Reveals:  Upper Extremities: Full ROM and Muscle Strength 5/5 Back without paraspinal Tenderness Lower Extremities: Right: Full ROM and Muscle Strength 5/5 Left: BKA Arrived in wheelchair  Neurological: He is alert and oriented to person, place, and time.  Skin: Skin is warm and dry.  Psychiatric: He has a normal mood and affect.  Nursing note and vitals reviewed.         Assessment & Plan:  1.Left BKA/ [place] Pain : Refilled: Oxycontin 15 mg one tablet every 12 hours#60 and Refilled: oxycodone 10 mg one tablet every 6hours as needed # 120 tablets. 07/16/2017 We will continue the opioid monitoring program, this consists of regular clinic visits, examinations, urine drug screen, pill counts as well as use of [place] Controlled Substance Reporting System. Continue: Topamax 50 mg one tablet at HS and Amitriptyline..  2. Chronic Bilateral Low Back Pain without Sciatica/ Chronic  pain syndrome related to his chronic left leg pain: No complaints Today  .Continue Current medication Regime. 07/16/2017 3. Hx of spinal stimulator. Continue to Monitor. 07/16/2017 4. COPD: On Continuous Oxygen Therapy @ 3 liters Nasal Cannula. Pulmonology Following. 07/16/2017 5. Depression: Continue: Cymbalta. 07/16/2017 6. Sacral Decubitus Stage 3: Wound Center Following  ( [facility], awaiting paperwork). 07/16/2017  20 minutes of face to face patient care time was spent during this visit. All questions were encouraged and answered.   F/U in 1 month

## 2017-07-21 ENCOUNTER — Other Ambulatory Visit: Payer: Self-pay | Admitting: Physical Medicine & Rehabilitation

## 2017-07-21 DIAGNOSIS — F329 Major depressive disorder, single episode, unspecified: Secondary | ICD-10-CM

## 2017-07-21 DIAGNOSIS — G546 Phantom limb syndrome with pain: Secondary | ICD-10-CM

## 2017-07-24 ENCOUNTER — Telehealth: Payer: Self-pay

## 2017-07-24 NOTE — Telephone Encounter (Signed)
Patient called requesting an increase on his dosage for his pain medication.  States just had surgery on a wound at the wound center that involved skin grafting and is now in intense pain due to that procedure.  Please advise.

## 2017-07-24 NOTE — Telephone Encounter (Signed)
Spoke with Zella Ball about patient, she stated she would need the notes from the procedure which was completed at the [facility] in [place].  I called the facility and they stated they will fax the notes over for [facility].

## 2017-07-24 NOTE — Telephone Encounter (Signed)
Return Jeremy [name] call, I haveobtain paperwork from [place].  Jeremy [name] underwent Debridement of his sacral wound on 07/23/2017. Notes were reviewed, Jeremy [name] will be allowed to take his Oxycodone 10 mg 5 times a day for the next two days only ( 07/24/17 and 07/25/17), also instructed to lie prone, and change his position from side to side for pressure relief. He verbalizes understanding.

## 2017-09-03 ENCOUNTER — Encounter: Payer: Medicare Other | Admitting: Physical Medicine & Rehabilitation

## 2017-09-04 ENCOUNTER — Encounter: Payer: Self-pay | Admitting: Registered Nurse

## 2017-09-04 ENCOUNTER — Encounter: Payer: Medicare Other | Attending: Physical Medicine & Rehabilitation | Admitting: Registered Nurse

## 2017-09-04 VITALS — BP 141/61 | HR 61 | Ht 70.0 in | Wt 310.0 lb

## 2017-09-04 DIAGNOSIS — L89153 Pressure ulcer of sacral region, stage 3: Secondary | ICD-10-CM | POA: Diagnosis not present

## 2017-09-04 DIAGNOSIS — G8929 Other chronic pain: Secondary | ICD-10-CM

## 2017-09-04 DIAGNOSIS — G546 Phantom limb syndrome with pain: Secondary | ICD-10-CM | POA: Diagnosis not present

## 2017-09-04 DIAGNOSIS — M545 Low back pain, unspecified: Secondary | ICD-10-CM

## 2017-09-04 DIAGNOSIS — R5381 Other malaise: Secondary | ICD-10-CM | POA: Diagnosis present

## 2017-09-04 DIAGNOSIS — S88112S Complete traumatic amputation at level between knee and ankle, left lower leg, sequela: Secondary | ICD-10-CM | POA: Diagnosis present

## 2017-09-04 DIAGNOSIS — Z79899 Other long term (current) drug therapy: Secondary | ICD-10-CM | POA: Insufficient documentation

## 2017-09-04 DIAGNOSIS — E1151 Type 2 diabetes mellitus with diabetic peripheral angiopathy without gangrene: Secondary | ICD-10-CM | POA: Insufficient documentation

## 2017-09-04 DIAGNOSIS — G894 Chronic pain syndrome: Secondary | ICD-10-CM | POA: Insufficient documentation

## 2017-09-04 DIAGNOSIS — Z5181 Encounter for therapeutic drug level monitoring: Secondary | ICD-10-CM | POA: Diagnosis present

## 2017-09-04 MED ORDER — OXYCODONE HCL 10 MG PO TABS: 10.0000 mg | ORAL_TABLET | Freq: Four times a day (QID) | ORAL | 0 refills | Status: DC | PRN

## 2017-09-04 MED ORDER — OXYCODONE HCL ER 15 MG PO T12A: 15.0000 mg | EXTENDED_RELEASE_TABLET | Freq: Two times a day (BID) | ORAL | 0 refills | Status: DC

## 2017-09-04 MED ORDER — OXYCODONE HCL ER 15 MG PO T12A
15.0000 mg | EXTENDED_RELEASE_TABLET | Freq: Two times a day (BID) | ORAL | 0 refills | Status: DC
Start: 2017-09-04 — End: 2017-11-03

## 2017-09-04 NOTE — Progress Notes (Signed)
Subjective:    Patient ID: Jeremy [name], male    DOB: 1950-05-06, 67 y.o.   MRN: [id number]  HPI: [name] is a 67 year old male who returns for follow up  Appointment for chronic pain and medication refill. He states his pain is located in his lower back occasionally, left stump and buttock. He rates his pain at this time 0. His current exercise regime is performing stretching exercises.   [name] Morphine Equivalent is 112.00 MME.   Mr. Blank also reports he has been diagnosed with osteomyelitis, he's receiving IV antibiotics, has a PICC line.   Pain Inventory Average Pain 5 Pain Right Now 0 My pain is sharp, stabbing and aching  In the last 24 hours, has pain interfered with the following? General activity 6 Relation with others 3 Enjoyment of life 6 What TIME of day is your pain at its worst? morning Sleep (in general) Poor  Pain is worse with: inactivity Pain improves with: medication Relief from Meds: 10  Mobility ability to climb steps?  no do you drive?  yes use a wheelchair needs help with transfers  Function retired  Neuro/Psych trouble walking  Prior Studies Any changes since last visit?  no  Physicians involved in your care Any changes since last visit?  no   Family History  Problem Relation Age of Onset  . Heart disease Brother    Social History   Socioeconomic History  . Marital status: Married    Spouse name: Not on file  . Number of children: Not on file  . Years of education: Not on file  . Highest education level: Not on file  Occupational History  . Not on file  Social Needs  . Financial resource strain: Not on file  . Food insecurity:    Worry: Not on file    Inability: Not on file  . Transportation needs:    Medical: Not on file    Non-medical: Not on file  Tobacco Use  . Smoking status: Former Smoker    Packs/day: 0.50    Years: 6.00    Pack years: 3.00    Types: Cigarettes    Last attempt to quit: 12/03/1990   Years since quitting: 26.7  . Smokeless tobacco: Never Used  Substance and Sexual Activity  . Alcohol use: Yes    Alcohol/week: 0.0 oz    Comment: rare  . Drug use: Yes  . Sexual activity: Not Currently  Lifestyle  . Physical activity:    Days per week: Not on file    Minutes per session: Not on file  . Stress: Not on file  Relationships  . Social connections:    Talks on phone: Not on file    Gets together: Not on file    Attends religious service: Not on file    Active member of club or organization: Not on file    Attends meetings of clubs or organizations: Not on file    Relationship status: Not on file  Other Topics Concern  . Not on file  Social History Narrative  . Not on file   Past Surgical History:  Procedure Laterality Date  . AMPUTATION Left 11/25/2012   Procedure: AMPUTATION BELOW KNEE;  Surgeon: [name], MD;  Location: [place];  Service: Orthopedics;  Laterality: Left;  Left Below Knee Amputation  . APLIGRAFT PLACEMENT Left 07/31/2012   Procedure: Apply Skin Graft;  Surgeon: [name], MD;  Location: [place];  Service: Orthopedics;  Laterality: Left;  . APPLICATION OF WOUND VAC Left 07/31/2012   Procedure: APPLICATION OF WOUND VAC;  Surgeon: [name], MD;  Location: [facility];  Service: Orthopedics;  Laterality: Left;  . arm surgery    . back stimulator    . CARDIAC CATHETERIZATION     Hx: of " around 1985 at [facility]"  . CYST EXCISION     Hx: of  . I&D EXTREMITY  03/13/2011   Procedure: IRRIGATION AND DEBRIDEMENT EXTREMITY;  Surgeon: [name], MD;  Location: [place];  Service: Orthopedics;  Laterality: Left;  . I&D EXTREMITY Left 11/06/2012   Procedure: IRRIGATION AND DEBRIDEMENT EXTREMITY-Left lower ;  Surgeon: [name], MD;  Location: [place];  Service: Orthopedics;  Laterality: Left;  Left Leg Irrigation and Debridement, Place Skin Graft, Wound VAC, [place]  . skin grafts    . SKIN SPLIT GRAFT  03/13/2012   Procedure: SKIN GRAFT SPLIT THICKNESS;   Surgeon: [name], MD;  Location: [facility];  Service: Orthopedics;  Laterality: Left;  Excisional Debridement Left Leg, Split Thickness Skin Graft, Wound VAC  . stomach stapling    . tummy tuck    . VEIN LIGATION AND STRIPPING     Past Medical History:  Diagnosis Date  . Anemia   . Asthma   . Cancer ([place])    hx basal cell skin cancer  . CHF (congestive heart failure) ([place])    does not see a cardiologist  . COPD (chronic obstructive pulmonary disease) ([place])    3L continuous  . Diabetes mellitus    not on medication  . DVT (deep venous thrombosis) ([facility])    LLE DVT '80's  . GERD (gastroesophageal reflux disease)   . Headache(784.0)    sinus headaches  . Leg pain   . Peripheral neuropathy   . Peripheral vascular disease ([place])   . Psoriasis   . Shortness of breath   . Sleep apnea    uses bipap, sleep study done in [place] 2 years ago, dr [name]   BP (!) 141/61   Pulse 61   Ht 5\' 10"  (1.778 m)   Wt (!) 310 lb (140.6 kg)   SpO2 97%   BMI 44.48 kg/m   Opioid Risk Score:   Fall Risk Score:  `1  Depression screen PHQ 2/9  Depression screen [facility] 2/9 05/21/2017 04/18/2017 12/30/2016 11/04/2016 01/04/2016 09/19/2015 07/19/2015  Decreased Interest 0 0 0 0 0 0 2  Down, Depressed, Hopeless 0 0 0 0 0 0 1  PHQ - 2 Score 0 0 0 0 0 0 3  Altered sleeping - - - - - - 2  Tired, decreased energy - - - - - - 3  Change in appetite - - - - - - 3  Feeling bad or failure about yourself  - - - - - - 3  Trouble concentrating - - - - - - 0  Moving slowly or fidgety/restless - - - - - - 0  Suicidal thoughts - - - - - - 1  PHQ-9 Score - - - - - - 15  Difficult doing work/chores - - - - - - Somewhat difficult     Review of Systems  Constitutional: Negative.   HENT: Negative.   Eyes: Negative.   Respiratory: Negative.   Cardiovascular: Negative.   Gastrointestinal: Negative.   Endocrine: Negative.   Genitourinary: Negative.   Musculoskeletal: Positive for arthralgias, back pain,  gait problem and  myalgias.  Skin: Negative.   Allergic/Immunologic: Negative.   Hematological: Negative.   Psychiatric/Behavioral: Negative.   All other systems reviewed and are negative.      Objective:   Physical Exam  Constitutional: He is oriented to person, place, and time. He appears well-developed and well-nourished.  HENT:  Head: Normocephalic and atraumatic.  Neck: Normal range of motion. Neck supple.  Cardiovascular: Normal rate and regular rhythm.  Pulmonary/Chest: Effort normal and breath sounds normal.  Musculoskeletal:  Normal Muscle Bulk and Muscle Testing Reveals: Upper Extremities: Full ROM and Muscle Strength 5/5 Back without spinal tenderness Lower Extremities: Right: Full ROM and Muscle Strength 5/5 Left: BKA Arrived in wheelchair  Neurological: He is alert and oriented to person, place, and time.  Skin: Skin is warm and dry.  Psychiatric: He has a normal mood and affect.  Nursing note and vitals reviewed.         Assessment & Plan:  1.Left BKA/ [place] Pain : Continue current medication regimen Refilled: Oxycontin 15 mg one tablet every 12 hours#60 and Refilled: oxycodone 10 mg one tablet every 6hours as needed # 120 tablets. 07/16/2017 We will continue the opioid monitoring program, this consists of regular clinic visits, examinations, urine drug screen, pill counts as well as use of [place] Controlled Substance Reporting System. Continue: Topamax50 mg one tablet at HS and Amitriptyline..  2. 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 09/04/2017 3. Hx of spinal stimulator. Continue to Monitor. 09/04/2017 4. COPD: On Continuous Oxygen Therapy @ 3 liters Nasal Cannula. Pulmonology Following. 09/04/2017 5. Depression: Continue: Cymbalta. 09/04/2017 6. Sacral Decubitus Stage 3: Wound Center Following  ( [facility], awaiting paperwork).  09/04/2017  20 minutes of face to face patient care time was spent during this visit. All questions were encouraged and answered.   F/U in 1 month

## 2017-09-05 ENCOUNTER — Telehealth (INDEPENDENT_AMBULATORY_CARE_PROVIDER_SITE_OTHER): Payer: Self-pay | Admitting: Orthopedic Surgery

## 2017-09-05 NOTE — Telephone Encounter (Signed)
Records authorization form mailed to patient per his request

## 2017-09-22 ENCOUNTER — Other Ambulatory Visit: Payer: Self-pay | Admitting: Physical Medicine & Rehabilitation

## 2017-09-22 DIAGNOSIS — F329 Major depressive disorder, single episode, unspecified: Secondary | ICD-10-CM

## 2017-09-22 DIAGNOSIS — G546 Phantom limb syndrome with pain: Secondary | ICD-10-CM

## 2017-10-28 ENCOUNTER — Other Ambulatory Visit: Payer: Self-pay | Admitting: Registered Nurse

## 2017-11-03 ENCOUNTER — Encounter: Payer: Self-pay | Admitting: Physical Medicine & Rehabilitation

## 2017-11-03 ENCOUNTER — Encounter: Payer: Medicare Other | Attending: Physical Medicine & Rehabilitation | Admitting: Physical Medicine & Rehabilitation

## 2017-11-03 VITALS — BP 154/76 | HR 68 | Resp 14 | Ht 70.0 in | Wt 310.0 lb

## 2017-11-03 DIAGNOSIS — Z5181 Encounter for therapeutic drug level monitoring: Secondary | ICD-10-CM

## 2017-11-03 DIAGNOSIS — R5381 Other malaise: Secondary | ICD-10-CM | POA: Diagnosis present

## 2017-11-03 DIAGNOSIS — Z79891 Long term (current) use of opiate analgesic: Secondary | ICD-10-CM

## 2017-11-03 DIAGNOSIS — G894 Chronic pain syndrome: Secondary | ICD-10-CM

## 2017-11-03 DIAGNOSIS — G546 Phantom limb syndrome with pain: Secondary | ICD-10-CM

## 2017-11-03 DIAGNOSIS — E1151 Type 2 diabetes mellitus with diabetic peripheral angiopathy without gangrene: Secondary | ICD-10-CM | POA: Diagnosis present

## 2017-11-03 DIAGNOSIS — S88112S Complete traumatic amputation at level between knee and ankle, left lower leg, sequela: Secondary | ICD-10-CM

## 2017-11-03 DIAGNOSIS — Z79899 Other long term (current) drug therapy: Secondary | ICD-10-CM | POA: Diagnosis present

## 2017-11-03 MED ORDER — OXYCODONE HCL 10 MG PO TABS: 10.0000 mg | ORAL_TABLET | Freq: Four times a day (QID) | ORAL | 0 refills | Status: DC | PRN

## 2017-11-03 MED ORDER — OXYCODONE HCL ER 15 MG PO T12A: 15.0000 mg | EXTENDED_RELEASE_TABLET | Freq: Two times a day (BID) | ORAL | 0 refills | Status: DC

## 2017-11-03 MED ORDER — TIZANIDINE HCL 2 MG PO TABS: 2.0000 mg | ORAL_TABLET | Freq: Three times a day (TID) | ORAL | 3 refills | Status: DC | PRN

## 2017-11-03 NOTE — Progress Notes (Signed)
Subjective:    Patient ID: Jeremy [name], male    DOB: 05/10/1950, 67 y.o.   MRN: [id number]  HPI   Jyair is here in follow up of his chronic pain. He has had ongoing issues with his sacral ulcer which has further progressed over the last couple months in particular. He has developed osteomyelitis and further would complications over the last couple months. He has been on numerous antibiotics and has been followed by wound care.  He has a follow-up MRI scheduled for later this week  As a result of his increased sacral pain, his oxycodone and OxyContin are not controlling his pain like they have in the past.  He has a hard time finding positions that are comfortable.  He spends a lot of time in his bed as a result.  He has been supplementing his oxycodone with extra strength Tylenol.  He takes 1500 mg twice daily.     Pain Inventory Average Pain 7 Pain Right Now 5 My pain is intermittent, sharp, stabbing and aching  In the last 24 hours, has pain interfered with the following? General activity 7 Relation with others 4 Enjoyment of life 7 What TIME of day is your pain at its worst? morning Sleep (in general) Poor  Pain is worse with: some activites Pain improves with: medication Relief from Meds: 3  Mobility walk with assistance use a walker ability to climb steps?  no do you drive?  yes use a wheelchair Do you have any goals in this area?  no  Function retired I need assistance with the following:  shopping Do you have any goals in this area?  no  Neuro/Psych trouble walking  Prior Studies Any changes since last visit?  no  Physicians involved in your care Any changes since last visit?  no   Family History  Problem Relation Age of Onset  . Heart disease Brother    Social History   Socioeconomic History  . Marital status: Married    Spouse name: Not on file  . Number of children: Not on file  . Years of education: Not on file  . Highest education level: Not  on file  Occupational History  . Not on file  Social Needs  . Financial resource strain: Not on file  . Food insecurity:    Worry: Not on file    Inability: Not on file  . Transportation needs:    Medical: Not on file    Non-medical: Not on file  Tobacco Use  . Smoking status: Former Smoker    Packs/day: 0.50    Years: 6.00    Pack years: 3.00    Types: Cigarettes    Last attempt to quit: 12/03/1990    Years since quitting: 26.9  . Smokeless tobacco: Never Used  Substance and Sexual Activity  . Alcohol use: Yes    Alcohol/week: 0.0 oz    Comment: rare  . Drug use: Yes  . Sexual activity: Not Currently  Lifestyle  . Physical activity:    Days per week: Not on file    Minutes per session: Not on file  . Stress: Not on file  Relationships  . Social connections:    Talks on phone: Not on file    Gets together: Not on file    Attends religious service: Not on file    Active member of club or organization: Not on file    Attends meetings of clubs or organizations: Not on file  Relationship status: Not on file  Other Topics Concern  . Not on file  Social History Narrative  . Not on file   Past Surgical History:  Procedure Laterality Date  . AMPUTATION Left 11/25/2012   Procedure: AMPUTATION BELOW KNEE;  Surgeon: [name], MD;  Location: [place];  Service: Orthopedics;  Laterality: Left;  Left Below Knee Amputation  . APLIGRAFT PLACEMENT Left 07/31/2012   Procedure: Apply Skin Graft;  Surgeon: [name], MD;  Location: [facility];  Service: Orthopedics;  Laterality: Left;  . APPLICATION OF WOUND VAC Left 07/31/2012   Procedure: APPLICATION OF WOUND VAC;  Surgeon: [name], MD;  Location: [facility];  Service: Orthopedics;  Laterality: Left;  . arm surgery    . back stimulator    . CARDIAC CATHETERIZATION     Hx: of " around 1985 at [facility]"  . CYST EXCISION     Hx: of  . I&D EXTREMITY  03/13/2011   Procedure: IRRIGATION AND DEBRIDEMENT EXTREMITY;  Surgeon: [name],  MD;  Location: [place];  Service: Orthopedics;  Laterality: Left;  . I&D EXTREMITY Left 11/06/2012   Procedure: IRRIGATION AND DEBRIDEMENT EXTREMITY-Left lower ;  Surgeon: [name], MD;  Location: [facility];  Service: Orthopedics;  Laterality: Left;  Left Leg Irrigation and Debridement, Place Skin Graft, Wound VAC, [place]  . skin grafts    . SKIN SPLIT GRAFT  03/13/2012   Procedure: SKIN GRAFT SPLIT THICKNESS;  Surgeon: [name], MD;  Location: [place];  Service: Orthopedics;  Laterality: Left;  Excisional Debridement Left Leg, Split Thickness Skin Graft, Wound VAC  . stomach stapling    . tummy tuck    . VEIN LIGATION AND STRIPPING     Past Medical History:  Diagnosis Date  . Anemia   . Asthma   . Cancer ([place])    hx basal cell skin cancer  . CHF (congestive heart failure) ([place])    does not see a cardiologist  . COPD (chronic obstructive pulmonary disease) ([place])    3L continuous  . Diabetes mellitus    not on medication  . DVT (deep venous thrombosis) ([facility])    LLE DVT '80's  . GERD (gastroesophageal reflux disease)   . Headache(784.0)    sinus headaches  . Leg pain   . Peripheral neuropathy   . Peripheral vascular disease ([place])   . Psoriasis   . Shortness of breath   . Sleep apnea    uses bipap, sleep study done in [place] 2 years ago, dr [name]   BP (!) 154/76   Pulse 68   Resp 14   Ht 5\' 10"  (1.778 m)   Wt (!) 310 lb (140.6 kg)   SpO2 94%   BMI 44.48 kg/m   Opioid Risk Score:   Fall Risk Score:  `1  Depression screen PHQ 2/9  Depression screen [facility] 2/9 05/21/2017 04/18/2017 12/30/2016 11/04/2016 01/04/2016 09/19/2015 07/19/2015  Decreased Interest 0 0 0 0 0 0 2  Down, Depressed, Hopeless 0 0 0 0 0 0 1  PHQ - 2 Score 0 0 0 0 0 0 3  Altered sleeping - - - - - - 2  Tired, decreased energy - - - - - - 3  Change in appetite - - - - - - 3  Feeling bad or failure about yourself  - - - - - - 3  Trouble concentrating - - - - - - 0  Moving slowly  or  fidgety/restless - - - - - - 0  Suicidal thoughts - - - - - - 1  PHQ-9 Score - - - - - - 15  Difficult doing work/chores - - - - - - Somewhat difficult    Review of Systems  Constitutional: Negative.   HENT: Negative.   Eyes: Negative.   Respiratory: Negative.   Cardiovascular: Negative.   Gastrointestinal: Negative.   Endocrine: Negative.   Genitourinary: Negative.   Musculoskeletal: Positive for back pain and gait problem.  Skin: Positive for wound.  Allergic/Immunologic: Negative.   Hematological: Negative.   Psychiatric/Behavioral: Negative.        Objective:   Physical Exam  General: No acute distress HEENT: EOMI, oral membranes moist Cards: reg rate  Chest: normal effort Abdomen: Soft, NT, ND Skin: dry, intact Extremities: no edema Musculoskeletal: 1+ edema in LE's. .Normal Muscle Bulk and Muscle Testing Reveals: Upper Extremities: Full ROM and Muscle Strength 5/5 Back without spinal or paraspinal tenderness Lower Extremities: Right Full ROM and Muscle strength 5/5 Left BKA with persistent flexion contracture.  Neurological: He is alert and oriented to person, place, and time.  Skin: sacral wound not visualized. No odor. Psychiatric: He is pleasant and cooperative    Assessment & Plan: 1. Left BKA/ Phantom Pain: -refilled oxycontin 15mg  q12. Second refill for next month -oxycodone10mg  ir one q8prn. he was instructed not to self-medicate  -.We will continue the controlled substance monitoring program, this consists of regular clinic visits, examinations, routine drug screening, pill counts as well as use of [place] Controlled Substance Reporting System. [place] today.  Drug swab today. -maintain trileptal for phantom limb pain---pt feels it's helped. Need to consider it as a possible source of his kidney stones however. 2. 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  3. Hx of spinal stimulator  4. COPD: On Continuous Oxygen  Therapy @ 3 liters Nasal Cannula per Pulmonology  5. Reactive depression: cymbalta back to 30mg   6. Hx of kidney stones 7. Pressure sores: per [facility] RN.                                      -nutrition, weight shifting    -mgt per wound care clinic    -Added tizanidine 2 mg every 8 hours as needed for muscle spasms  15 minutes of face to face patient care time was spent during this visit. All questions were encouraged and answered.  Follow-up in 2 months with nurse practitioner

## 2017-11-03 NOTE — Patient Instructions (Signed)
PLEASE FEEL FREE TO CALL OUR OFFICE WITH ANY PROBLEMS OR QUESTIONS ([phone])      

## 2017-11-05 LAB — DRUG TOX MONITOR 1 W/CONF, ORAL FLD
AMPHETAMINES: NEGATIVE ng/mL (ref <10)
Barbiturates: NEGATIVE ng/mL (ref <10)
Benzodiazepines: NEGATIVE ng/mL (ref <0.50)
Buprenorphine: NEGATIVE ng/mL (ref <0.10)
CODEINE: NEGATIVE ng/mL (ref <2.5)
Cocaine: NEGATIVE ng/mL (ref <5.0)
DIHYDROCODEINE: NEGATIVE ng/mL (ref <2.5)
Fentanyl: NEGATIVE ng/mL (ref <0.10)
HEROIN METABOLITE: NEGATIVE ng/mL (ref <1.0)
Hydrocodone: NEGATIVE ng/mL (ref <2.5)
Hydromorphone: NEGATIVE ng/mL (ref <2.5)
MARIJUANA: NEGATIVE ng/mL (ref <2.5)
MDMA: NEGATIVE ng/mL (ref <10)
Meprobamate: NEGATIVE ng/mL (ref <2.5)
Methadone: NEGATIVE ng/mL (ref <5.0)
Morphine: NEGATIVE ng/mL (ref <2.5)
NOROXYCODONE: 50.4 ng/mL — AB (ref <2.5)
Nicotine Metabolite: NEGATIVE ng/mL (ref <5.0)
Norhydrocodone: NEGATIVE ng/mL (ref <2.5)
OPIATES: POSITIVE ng/mL — AB (ref <2.5)
OXYMORPHONE: NEGATIVE ng/mL (ref <2.5)
Phencyclidine: NEGATIVE ng/mL (ref <10)
TAPENTADOL: NEGATIVE ng/mL (ref <5.0)
TRAMADOL: NEGATIVE ng/mL (ref <5.0)
Zolpidem: NEGATIVE ng/mL (ref <5.0)

## 2017-11-05 LAB — DRUG TOX ALC METAB W/CON, ORAL FLD: Alcohol Metabolite: NEGATIVE ng/mL (ref <25)

## 2017-11-06 ENCOUNTER — Telehealth: Payer: Self-pay | Admitting: *Deleted

## 2017-11-06 NOTE — Telephone Encounter (Signed)
Oral swab drug screen was consistent for prescribed medications.  ?

## 2017-12-24 ENCOUNTER — Telehealth: Payer: Self-pay | Admitting: *Deleted

## 2017-12-24 NOTE — Telephone Encounter (Signed)
Jeremy [name] called to make sure we have both him and his wife's pharmacy listed as [place] only. They are not dealing with [facility] anymore. I have verified both are with [place].

## 2018-01-02 ENCOUNTER — Encounter: Payer: Medicare Other | Attending: Physical Medicine & Rehabilitation | Admitting: Registered Nurse

## 2018-01-02 ENCOUNTER — Encounter: Payer: Self-pay | Admitting: Registered Nurse

## 2018-01-02 VITALS — BP 147/75 | HR 66 | Ht 70.0 in | Wt 330.0 lb

## 2018-01-02 DIAGNOSIS — S88112S Complete traumatic amputation at level between knee and ankle, left lower leg, sequela: Secondary | ICD-10-CM

## 2018-01-02 DIAGNOSIS — G8929 Other chronic pain: Secondary | ICD-10-CM

## 2018-01-02 DIAGNOSIS — M545 Low back pain, unspecified: Secondary | ICD-10-CM

## 2018-01-02 DIAGNOSIS — G894 Chronic pain syndrome: Secondary | ICD-10-CM | POA: Diagnosis not present

## 2018-01-02 DIAGNOSIS — L89153 Pressure ulcer of sacral region, stage 3: Secondary | ICD-10-CM | POA: Diagnosis not present

## 2018-01-02 DIAGNOSIS — Z79891 Long term (current) use of opiate analgesic: Secondary | ICD-10-CM

## 2018-01-02 DIAGNOSIS — Z79899 Other long term (current) drug therapy: Secondary | ICD-10-CM | POA: Diagnosis present

## 2018-01-02 DIAGNOSIS — R5381 Other malaise: Secondary | ICD-10-CM | POA: Insufficient documentation

## 2018-01-02 DIAGNOSIS — E1151 Type 2 diabetes mellitus with diabetic peripheral angiopathy without gangrene: Secondary | ICD-10-CM | POA: Diagnosis present

## 2018-01-02 DIAGNOSIS — G546 Phantom limb syndrome with pain: Secondary | ICD-10-CM

## 2018-01-02 DIAGNOSIS — Z5181 Encounter for therapeutic drug level monitoring: Secondary | ICD-10-CM | POA: Diagnosis present

## 2018-01-02 MED ORDER — OXYCODONE HCL 15 MG PO TABS: 15.0000 mg | ORAL_TABLET | Freq: Three times a day (TID) | ORAL | 0 refills | Status: DC | PRN

## 2018-01-02 NOTE — Patient Instructions (Signed)
Call office in two weeks to evaluate medication change: [phone]

## 2018-01-02 NOTE — Progress Notes (Signed)
Subjective:    Patient ID: Jeremy [name], male    DOB: 08-01-1950, 67 y.o.   MRN: [id number]  HPI: [name] is a 67 year old male who returns for follow up appointment for chronic pain and medication refill. He states his pain is located in lower back, sacral pain ( decubitus) and left stump pain  ( phantom). He rates his pain 4. His current exeercise regime is performing stretching exercises.   Jeremy [name] reports his Oxycontin is ineffective, it offers no no relief to his pain. Also reports Oxycodone only offers 2-3 hours of relief. Today we will increase his Oxycodone to 15 mg Q 8 hours and his Oxycontin will be discontinued per patient request. He reports his last dose of Oxycontin was two days ago and denies andy withdrawal symptoms.   Jeremy [name] Morphine Equivalent for last month was 105.00 MME. With his current dose of Oxycontin his Morphine Equivalent will be 67.5 MME.   Last Oral Swab was Performed on 11/03/2017, it was consistent.   Pain Inventory Average Pain 7 Pain Right Now 4 My pain is constant, sharp, stabbing, tingling and aching  In the last 24 hours, has pain interfered with the following? General activity 8 Relation with others 3 Enjoyment of life 2 What TIME of day is your pain at its worst? evening Sleep (in general) Fair  Pain is worse with: inactivity Pain improves with: medication Relief from Meds: .  Mobility use a walker ability to climb steps?  no do you drive?  yes use a wheelchair needs help with transfers  Function retired  Neuro/Psych numbness tingling spasms  Prior Studies Any changes since last visit?  no  Physicians involved in your care Any changes since last visit?  no   Family History  Problem Relation Age of Onset  . Heart disease Brother    Social History   Socioeconomic History  . Marital status: Married    Spouse name: Not on file  . Number of children: Not on file  . Years of education: Not on file  . Highest  education level: Not on file  Occupational History  . Not on file  Social Needs  . Financial resource strain: Not on file  . Food insecurity:    Worry: Not on file    Inability: Not on file  . Transportation needs:    Medical: Not on file    Non-medical: Not on file  Tobacco Use  . Smoking status: Former Smoker    Packs/day: 0.50    Years: 6.00    Pack years: 3.00    Types: Cigarettes    Last attempt to quit: 12/03/1990    Years since quitting: 27.1  . Smokeless tobacco: Never Used  Substance and Sexual Activity  . Alcohol use: Yes    Alcohol/week: 0.0 standard drinks    Comment: rare  . Drug use: Yes  . Sexual activity: Not Currently  Lifestyle  . Physical activity:    Days per week: Not on file    Minutes per session: Not on file  . Stress: Not on file  Relationships  . Social connections:    Talks on phone: Not on file    Gets together: Not on file    Attends religious service: Not on file    Active member of club or organization: Not on file    Attends meetings of clubs or organizations: Not on file    Relationship status: Not on file  Other Topics Concern  . Not on file  Social History Narrative  . Not on file   Past Surgical History:  Procedure Laterality Date  . AMPUTATION Left 11/25/2012   Procedure: AMPUTATION BELOW KNEE;  Surgeon: [name], MD;  Location: [facility];  Service: Orthopedics;  Laterality: Left;  Left Below Knee Amputation  . APLIGRAFT PLACEMENT Left 07/31/2012   Procedure: Apply Skin Graft;  Surgeon: [name], MD;  Location: [facility];  Service: Orthopedics;  Laterality: Left;  . APPLICATION OF WOUND VAC Left 07/31/2012   Procedure: APPLICATION OF WOUND VAC;  Surgeon: [name], MD;  Location: [facility];  Service: Orthopedics;  Laterality: Left;  . arm surgery    . back stimulator    . CARDIAC CATHETERIZATION     Hx: of " around 1985 at [facility]"  . CYST EXCISION     Hx: of  . I&D EXTREMITY  03/13/2011   Procedure: IRRIGATION AND DEBRIDEMENT  EXTREMITY;  Surgeon: [name], MD;  Location: [facility];  Service: Orthopedics;  Laterality: Left;  . I&D EXTREMITY Left 11/06/2012   Procedure: IRRIGATION AND DEBRIDEMENT EXTREMITY-Left lower ;  Surgeon: [name], MD;  Location: [facility];  Service: Orthopedics;  Laterality: Left;  Left Leg Irrigation and Debridement, Place Skin Graft, Wound VAC, [place]in  . skin grafts    . SKIN SPLIT GRAFT  03/13/2012   Procedure: SKIN GRAFT SPLIT THICKNESS;  Surgeon: [name], MD;  Location: [facility];  Service: Orthopedics;  Laterality: Left;  Excisional Debridement Left Leg, Split Thickness Skin Graft, Wound VAC  . stomach stapling    . tummy tuck    . VEIN LIGATION AND STRIPPING     Past Medical History:  Diagnosis Date  . Anemia   . Asthma   . Cancer ([place])    hx basal cell skin cancer  . CHF (congestive heart failure) ([facility])    does not see a cardiologist  . COPD (chronic obstructive pulmonary disease) ([place])    3L continuous  . Diabetes mellitus    not on medication  . DVT (deep venous thrombosis) ([facility])    LLE DVT '80's  . GERD (gastroesophageal reflux disease)   . Headache(784.0)    sinus headaches  . Leg pain   . Peripheral neuropathy   . Peripheral vascular disease ([place])   . Psoriasis   . Shortness of breath   . Sleep apnea    uses bipap, sleep study done in [place] 2 years ago, dr [name]   There were no vitals taken for this visit.  Opioid Risk Score:   Fall Risk Score:  `1  Depression screen PHQ 2/9  Depression screen [facility] 2/9 05/21/2017 04/18/2017 12/30/2016 11/04/2016 01/04/2016 09/19/2015 07/19/2015  Decreased Interest 0 0 0 0 0 0 2  Down, Depressed, Hopeless 0 0 0 0 0 0 1  PHQ - 2 Score 0 0 0 0 0 0 3  Altered sleeping - - - - - - 2  Tired, decreased energy - - - - - - 3  Change in appetite - - - - - - 3  Feeling bad or failure about yourself  - - - - - - 3  Trouble concentrating - - - - - - 0  Moving slowly or fidgety/restless - - - - - - 0  Suicidal thoughts -  - - - - - 1  PHQ-9 Score - - - - - - 15  Difficult doing work/chores - - - - - -  Somewhat difficult     Review of Systems  Constitutional: Negative.   HENT: Negative.   Eyes: Negative.   Respiratory: Negative.   Cardiovascular: Negative.   Gastrointestinal: Negative.   Endocrine: Negative.   Genitourinary: Negative.   Musculoskeletal: Positive for arthralgias and myalgias.  Skin: Negative.   Allergic/Immunologic: Negative.   Neurological: Positive for numbness.  Hematological: Negative.   Psychiatric/Behavioral: Negative.   All other systems reviewed and are negative.      Objective:   Physical Exam  Constitutional: He is oriented to person, place, and time. He appears well-developed and well-nourished.  HENT:  Head: Normocephalic and atraumatic.  Neck: Normal range of motion. Neck supple.  Cardiovascular: Normal rate and regular rhythm.  Pulmonary/Chest: Effort normal and breath sounds normal.  Continuous Oxygen at 3 liters Nasal Cannula  Musculoskeletal:  Normal Muscle Bulk and Muscle Testing Reveals: Upper Extremities: Full ROM and Muscle Strength 5/5 Lower Extremities: Right: Full ROM and Muscle Strength 5/5 Arrived in wheelchair   Neurological: He is alert and oriented to person, place, and time.  Skin: Skin is warm and dry.  Psychiatric: He has a normal mood and affect. His behavior is normal.  Nursing note and vitals reviewed.         Assessment & Plan:  1.Left BKA/ [place] Pain : Continue current medication regimen: Discontinued Oxycontin due to ineffectiveness per patient request.  RX: Increased oxycodone 15 mg one tablet every 8hours as needed # 90  Tablets, Instructed to call office in two weeks to evaluate medicate changes. [name] 01/02/2018 We will continue the opioid monitoring program, this consists of regular clinic visits, examinations, urine drug screen, pill counts as well as use of [place] Controlled Substance Reporting System. Continue:  Topamax50 mg one tablet at HS and Amitriptyline..  2. 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 01/02/2018 3. Hx of spinal stimulator. Continue to Monitor. 01/02/2018 4. COPD: On Continuous Oxygen Therapy @ 3 liters Nasal Cannula. Pulmonology Following. 01/02/2018 5. Depression: Continue: Cymbalta. 01/02/2018 6. Sacral Decubitus Stage 3: Wound CenterFollowing ( [facility]). [place] adequate nutrition and weight shifting 01/02/2018  20 minutes of face to face patient care time was spent during this visit. All questions were encouraged and answered.   F/U in 1 month

## 2018-01-08 ENCOUNTER — Telehealth: Payer: Self-pay

## 2018-01-08 NOTE — Telephone Encounter (Signed)
Pt called stating he was diagnosed with Pneumonia and the doctor is calling him in a cough medicine because OTC meds are not working for him. Per Danella Sensing, NP if PCP prescribes him a cough medicine with codeine he has to stop his Oxycodone and report to the office that the PCP prescribed. I notified pt and he understood.

## 2018-01-08 NOTE — Telephone Encounter (Signed)
Patient reports cough syrup is promethazine DM without codeine.  Contacted pharmacy and confirmed

## 2018-01-14 ENCOUNTER — Telehealth: Payer: Self-pay

## 2018-01-14 NOTE — Telephone Encounter (Signed)
Pt called stating he was to call with an update on the new medication. He states the new medication is not working, it last 6 hrs and then he starts hurting bad again. Please advice.

## 2018-01-15 NOTE — Telephone Encounter (Signed)
Return Jeremy [name] call, he states he's receiving 6 hours of relief with the  Oxycodone 15 mg. His current MME is 67.50 MME in the past when he was prescribe Oxycontin and oxycodone his MME was 105.00 MME. Last vist Jeremy [name] reports his Oxycontin was ineffective. Today he has 45 tablets of his Oxycodone he was instructed to increase the Oxycodone to every 6 hours as needed and call office on Monday 01/19/2018, to evaluate medication change. He verbalizes understanding.

## 2018-01-19 ENCOUNTER — Telehealth: Payer: Self-pay | Admitting: *Deleted

## 2018-01-19 MED ORDER — OXYCODONE HCL 15 MG PO TABS
15.0000 mg | ORAL_TABLET | Freq: Four times a day (QID) | ORAL | 0 refills | Status: DC | PRN
Start: 2018-01-19 — End: 2018-02-11

## 2018-01-19 NOTE — Telephone Encounter (Signed)
Placed a call to Jeremy [name]. He has tolerated the medication change, we will send over Oxycodone prescription. PMP Reviewed Oxycodone was picked up on 01/02/2018. He verbalizes understanding.

## 2018-01-19 NOTE — Telephone Encounter (Signed)
Mr Wortley says the medication changes 1 q 6 hr worked well.  He has 26 pills left but will need a refill on Saturday because the pharmacy is not open on Sunday. He said Zella Ball said to call.

## 2018-01-26 ENCOUNTER — Telehealth: Payer: Self-pay | Admitting: Registered Nurse

## 2018-01-26 NOTE — Telephone Encounter (Signed)
Placed a call to Jeremy [name] regarding PA for Oxycodone. Jeremy [name] statred a PA on cover my Meds and it stated no PA was needed. The pharmacist stated the insurance wouldn't allow the full prescription to go through based on his last refill and the 30 day cycle. Jeremy [name] had to pay cash he wasn't able to call our office on Saturday the 01/24/2018, since we were closed. Placed a call to Jeremy [name] and discuss the above, he was instructed to clll his insurance company to see if they will give a refund. His appointment with Jeremy [name] will be changed to

## 2018-01-26 NOTE — Telephone Encounter (Signed)
Placed a call to Mr. Rabbani, his appointment was changed to 03/02/2018 with Dr. Naaman Plummer arrival time 2:20 for 2:40 appointment. He verbalizes understanding. Instructed to call office on 02/20/2018, regarding his prescription, he verbalizes understanding.

## 2018-01-30 ENCOUNTER — Other Ambulatory Visit: Payer: Self-pay | Admitting: Registered Nurse

## 2018-02-11 ENCOUNTER — Other Ambulatory Visit: Payer: Self-pay | Admitting: Registered Nurse

## 2018-02-11 MED ORDER — OXYCODONE HCL 15 MG PO TABS: 15.0000 mg | ORAL_TABLET | Freq: Four times a day (QID) | ORAL | 0 refills | Status: DC | PRN

## 2018-02-11 NOTE — Telephone Encounter (Signed)
According to the PMP Aware Web-site Oxycodone was filled on 01/23/2018. His Oxycodone prescription refilled, he has a scheduled appointment with Dr Naaman Plummer on 03/02/2018. Placed a call to Jeremy [name] he is aware of the above.

## 2018-02-11 NOTE — Telephone Encounter (Signed)
Needs rx refill for oxycodone by nov 1 To [facility] in [place], [place]  Thanks, Vilinda Blanks.

## 2018-02-20 ENCOUNTER — Telehealth: Payer: Self-pay | Admitting: Registered Nurse

## 2018-02-20 NOTE — Telephone Encounter (Signed)
Mr. Fester prescription was already sent to pharmacy on 02/11/2018 and I  spoke with pharmacist to verify the above. His prescription is on file.

## 2018-02-25 ENCOUNTER — Ambulatory Visit: Payer: Medicare Other | Admitting: Physical Medicine & Rehabilitation

## 2018-03-02 ENCOUNTER — Encounter: Payer: Self-pay | Admitting: Physical Medicine & Rehabilitation

## 2018-03-02 ENCOUNTER — Other Ambulatory Visit: Payer: Self-pay

## 2018-03-02 ENCOUNTER — Encounter: Payer: Medicare Other | Attending: Physical Medicine & Rehabilitation | Admitting: Physical Medicine & Rehabilitation

## 2018-03-02 VITALS — BP 165/67 | HR 73

## 2018-03-02 DIAGNOSIS — S88112S Complete traumatic amputation at level between knee and ankle, left lower leg, sequela: Secondary | ICD-10-CM | POA: Diagnosis present

## 2018-03-02 DIAGNOSIS — S88119S Complete traumatic amputation at level between knee and ankle, unspecified lower leg, sequela: Secondary | ICD-10-CM | POA: Diagnosis not present

## 2018-03-02 DIAGNOSIS — G894 Chronic pain syndrome: Secondary | ICD-10-CM | POA: Diagnosis present

## 2018-03-02 DIAGNOSIS — Z5181 Encounter for therapeutic drug level monitoring: Secondary | ICD-10-CM | POA: Insufficient documentation

## 2018-03-02 DIAGNOSIS — E1151 Type 2 diabetes mellitus with diabetic peripheral angiopathy without gangrene: Secondary | ICD-10-CM | POA: Diagnosis present

## 2018-03-02 DIAGNOSIS — Z79899 Other long term (current) drug therapy: Secondary | ICD-10-CM | POA: Diagnosis present

## 2018-03-02 DIAGNOSIS — G546 Phantom limb syndrome with pain: Secondary | ICD-10-CM | POA: Diagnosis not present

## 2018-03-02 DIAGNOSIS — R5381 Other malaise: Secondary | ICD-10-CM | POA: Diagnosis present

## 2018-03-02 MED ORDER — OXYCODONE HCL 15 MG PO TABS: 15.0000 mg | ORAL_TABLET | Freq: Four times a day (QID) | ORAL | 0 refills | Status: DC | PRN

## 2018-03-02 NOTE — Progress Notes (Signed)
Subjective:    Patient ID: Jeremy [name], male    DOB: [birth year], 67 y.o.   MRN: [id number]  HPI   [place] is here in follow up of his chronic pain. He has ongoing multiple medical issues which now include skin breakdown and another kidney stone.  He states that the skin issue involves his left inguinal fold and that he was given a cream by the wound care clinic.  He remains on 3 L of oxygen for his COPD and is wheelchair-bound.  For pain control he is using oxycodone 15 mg to 6 hours as needed.  He has a no fill date of 02/20/2018 on his most recent prescription.  Pain Inventory Average Pain 4 Pain Right Now 4 My pain is sharp, burning, stabbing and aching  In the last 24 hours, has pain interfered with the following? General activity 5 Relation with others 5 Enjoyment of life 5 What TIME of day is your pain at its worst? morning evening Sleep (in general) Fair  Pain is worse with: inactivity Pain improves with: medication Relief from Meds: 9  Mobility use a walker ability to climb steps?  no do you drive?  yes use a wheelchair needs help with transfers  Function retired I need assistance with the following:  household duties and shopping  Neuro/Psych No problems in this area  Prior Studies Any changes since last visit?  no  Physicians involved in your care Any changes since last visit?  no   Family History  Problem Relation Age of Onset  . Heart disease Brother    Social History   Socioeconomic History  . Marital status: Married    Spouse name: Not on file  . Number of children: Not on file  . Years of education: Not on file  . Highest education level: Not on file  Occupational History  . Not on file  Social Needs  . Financial resource strain: Not on file  . Food insecurity:    Worry: Not on file    Inability: Not on file  . Transportation needs:    Medical: Not on file    Non-medical: Not on file  Tobacco Use  . Smoking status: Former Smoker   Packs/day: 0.50    Years: 6.00    Pack years: 3.00    Types: Cigarettes    Last attempt to quit: 12/03/1990    Years since quitting: 27.2  . Smokeless tobacco: Never Used  Substance and Sexual Activity  . Alcohol use: Yes    Alcohol/week: 0.0 standard drinks    Comment: rare  . Drug use: Yes  . Sexual activity: Not Currently  Lifestyle  . Physical activity:    Days per week: Not on file    Minutes per session: Not on file  . Stress: Not on file  Relationships  . Social connections:    Talks on phone: Not on file    Gets together: Not on file    Attends religious service: Not on file    Active member of club or organization: Not on file    Attends meetings of clubs or organizations: Not on file    Relationship status: Not on file  Other Topics Concern  . Not on file  Social History Narrative  . Not on file   Past Surgical History:  Procedure Laterality Date  . AMPUTATION Left 11/25/2012   Procedure: AMPUTATION BELOW KNEE;  Surgeon: [name], MD;  Location: [facility];  Service: Orthopedics;  Laterality: Left;  Left Below Knee Amputation  . APLIGRAFT PLACEMENT Left 07/31/2012   Procedure: Apply Skin Graft;  Surgeon: [name], MD;  Location: [facility];  Service: Orthopedics;  Laterality: Left;  . APPLICATION OF WOUND VAC Left 07/31/2012   Procedure: APPLICATION OF WOUND VAC;  Surgeon: [name], MD;  Location: [facility];  Service: Orthopedics;  Laterality: Left;  . arm surgery    . back stimulator    . CARDIAC CATHETERIZATION     Hx: of " around 1985 at [facility]"  . CYST EXCISION     Hx: of  . I&D EXTREMITY  03/13/2011   Procedure: IRRIGATION AND DEBRIDEMENT EXTREMITY;  Surgeon: [name], MD;  Location: [facility];  Service: Orthopedics;  Laterality: Left;  . I&D EXTREMITY Left 11/06/2012   Procedure: IRRIGATION AND DEBRIDEMENT EXTREMITY-Left lower ;  Surgeon: [name], MD;  Location: [facility];  Service: Orthopedics;  Laterality: Left;  Left Leg Irrigation and Debridement, Place  Skin Graft, Wound VAC, [place]askin  . skin grafts    . SKIN SPLIT GRAFT  03/13/2012   Procedure: SKIN GRAFT SPLIT THICKNESS;  Surgeon: [name], MD;  Location: [facility];  Service: Orthopedics;  Laterality: Left;  Excisional Debridement Left Leg, Split Thickness Skin Graft, Wound VAC  . stomach stapling    . tummy tuck    . VEIN LIGATION AND STRIPPING     Past Medical History:  Diagnosis Date  . Anemia   . Asthma   . Cancer ([place])    hx basal cell skin cancer  . CHF (congestive heart failure) ([place])    does not see a cardiologist  . COPD (chronic obstructive pulmonary disease) ([place])    3L continuous  . Diabetes mellitus    not on medication  . DVT (deep venous thrombosis) ([facility])    LLE DVT '80's  . GERD (gastroesophageal reflux disease)   . Headache(784.0)    sinus headaches  . Leg pain   . Peripheral neuropathy   . Peripheral vascular disease ([place])   . Psoriasis   . Shortness of breath   . Sleep apnea    uses bipap, sleep study done in [place] 2 years ago, dr [name]   BP (!) 165/67   Pulse 73   SpO2 95%   Opioid Risk Score:   Fall Risk Score:  `1  Depression screen PHQ 2/9  Depression screen [facility] 2/9 03/02/2018 05/21/2017 04/18/2017 12/30/2016 11/04/2016 01/04/2016 09/19/2015  Decreased Interest 0 0 0 0 0 0 0  Down, Depressed, Hopeless 0 0 0 0 0 0 0  PHQ - 2 Score 0 0 0 0 0 0 0  Altered sleeping - - - - - - -  Tired, decreased energy - - - - - - -  Change in appetite - - - - - - -  Feeling bad or failure about yourself  - - - - - - -  Trouble concentrating - - - - - - -  Moving slowly or fidgety/restless - - - - - - -  Suicidal thoughts - - - - - - -  PHQ-9 Score - - - - - - -  Difficult doing work/chores - - - - - - -    Review of Systems  Constitutional: Negative.   HENT: Negative.   Eyes: Negative.   Respiratory: Negative.   Cardiovascular: Negative.   Gastrointestinal: Negative.   Endocrine: Negative.   Genitourinary: Negative.   Musculoskeletal:  Negative.   Skin: Positive for rash.  Allergic/Immunologic: Negative.   Neurological: Negative.   Hematological: Negative.   Psychiatric/Behavioral: Negative.   All other systems reviewed and are negative.      Objective:   Physical Exam  General: No acute distress HEENT: EOMI, oral membranes moist Cards: reg rate  Chest: normal effort Abdomen: Soft, NT, ND Skin: dry, intact Extremities: no edema  Musculoskeletal: 1+ edema in LE's..Normal Muscle Bulk and Muscle Testing Reveals: Upper Extremities: Full ROM and Muscle Strength 5/5 Back without spinal or paraspinal tenderness Lower Extremities: Right Full ROM and Muscle strength 5/5 Left BKA with persistent flexion contracture.  Neurological: He is alert and oriented to person, place, and time.  Skin:  Sacral wound not visualized.  Left inguinal wound is about 5 x 10 inches in diameter.  Only the central area along the crease itself seems to be actively inflamed.  The other areas appear to be drying and resolving.  He has a powder or cream spread across the wound as well.[name] Psychiatric: He is pleasant and cooperative    Assessment & Plan: 1. Left BKA/ Phantom Pain: -Oxycodone 15 mg 1 every 6 hours as needed #120 was refilled today. -Medication was refilled and a second prescription was sent to the patient's pharmacy for next month.    -We will continue the controlled substance monitoring program, this consists of regular clinic visits, examinations, routine drug screening, pill counts as well as use of [place] Controlled Substance Reporting System. [place] was [place] today.  . 2. 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  3. Hx of spinal stimulator  4. COPD: On Continuous Oxygen Therapy @ 3 liters Nasal Cannula per Pulmonology  5. Reactive depression: cymbalta back to 30mg   6.Hx of kidney stones 7. Pressure sores: per [facility] RN.  -nutrition, weight shifting                                      -mgt per wound care clinic                                    -tizanidine 2 mg every 8 hours as needed for muscle spasms    -Discussed local skin care for likely fungal infection of groin  61minutes of face to face patient care time was spent during this visit. All questions were encouraged and answered.  Follow-up in 2 months with nurse practitioner

## 2018-03-02 NOTE — Patient Instructions (Signed)
PLEASE FEEL FREE TO CALL OUR OFFICE WITH ANY PROBLEMS OR QUESTIONS ([phone])      

## 2018-03-13 ENCOUNTER — Other Ambulatory Visit: Payer: Self-pay | Admitting: Physical Medicine & Rehabilitation

## 2018-03-13 DIAGNOSIS — S88112S Complete traumatic amputation at level between knee and ankle, left lower leg, sequela: Secondary | ICD-10-CM

## 2018-03-13 DIAGNOSIS — G894 Chronic pain syndrome: Secondary | ICD-10-CM

## 2018-03-31 ENCOUNTER — Encounter: Payer: Medicare Other | Attending: Physical Medicine & Rehabilitation | Admitting: Registered Nurse

## 2018-03-31 ENCOUNTER — Encounter: Payer: Self-pay | Admitting: Registered Nurse

## 2018-03-31 VITALS — BP 152/73 | HR 75

## 2018-03-31 DIAGNOSIS — M545 Low back pain, unspecified: Secondary | ICD-10-CM

## 2018-03-31 DIAGNOSIS — S88112S Complete traumatic amputation at level between knee and ankle, left lower leg, sequela: Secondary | ICD-10-CM | POA: Insufficient documentation

## 2018-03-31 DIAGNOSIS — G8929 Other chronic pain: Secondary | ICD-10-CM | POA: Diagnosis not present

## 2018-03-31 DIAGNOSIS — R5381 Other malaise: Secondary | ICD-10-CM | POA: Diagnosis present

## 2018-03-31 DIAGNOSIS — G894 Chronic pain syndrome: Secondary | ICD-10-CM | POA: Diagnosis present

## 2018-03-31 DIAGNOSIS — Z79899 Other long term (current) drug therapy: Secondary | ICD-10-CM | POA: Diagnosis present

## 2018-03-31 DIAGNOSIS — Z5181 Encounter for therapeutic drug level monitoring: Secondary | ICD-10-CM | POA: Diagnosis present

## 2018-03-31 DIAGNOSIS — R1032 Left lower quadrant pain: Secondary | ICD-10-CM

## 2018-03-31 DIAGNOSIS — S88119S Complete traumatic amputation at level between knee and ankle, unspecified lower leg, sequela: Secondary | ICD-10-CM

## 2018-03-31 DIAGNOSIS — E1151 Type 2 diabetes mellitus with diabetic peripheral angiopathy without gangrene: Secondary | ICD-10-CM | POA: Diagnosis present

## 2018-03-31 MED ORDER — OXYCODONE HCL 15 MG PO TABS: 15.0000 mg | ORAL_TABLET | Freq: Four times a day (QID) | ORAL | 0 refills | Status: DC | PRN

## 2018-03-31 NOTE — Progress Notes (Signed)
Subjective:    Patient ID: Jeremy [name], male    DOB: 09-14-1950, 67 y.o.   MRN: [id number]  HPI: Jeremy [name] is a 67 y.o. male who returns for follow up appointment for chronic pain and medication refill. He states hispain is located in his lower back and left groin. Also reports pain with his skin breakdown in his left inguinal fold is , area pink and scaly, wound care following he reports. He rates his  Pain 3. His current exercise regime is performing stretching exercises in bed..  [name] Morphine equivalent is 90.00 MME.  Last Oral Swab was Performed on 11/03/2017, it was consistent.   Pain Inventory Average Pain 6 Pain Right Now 3 My pain is constant, sharp, stabbing and aching  In the last 24 hours, has pain interfered with the following? General activity 4 Relation with others 4 Enjoyment of life 5 What TIME of day is your pain at its worst? varies Sleep (in general) Poor  Pain is worse with: sitting and inactivity Pain improves with: medication Relief from Meds: 9  Mobility walk with assistance use a walker how many minutes can you walk? 1-2 ability to climb steps?  no do you drive?  yes use a wheelchair Do you have any goals in this area?  no  Function retired Do you have any goals in this area?  no  Neuro/Psych trouble walking  Prior Studies Any changes since last visit?  no  Physicians involved in your care Any changes since last visit?  no   Family History  Problem Relation Age of Onset  . Heart disease Brother    Social History   Socioeconomic History  . Marital status: Married    Spouse name: Not on file  . Number of children: Not on file  . Years of education: Not on file  . Highest education level: Not on file  Occupational History  . Not on file  Social Needs  . Financial resource strain: Not on file  . Food insecurity:    Worry: Not on file    Inability: Not on file  . Transportation needs:    Medical: Not on file   Non-medical: Not on file  Tobacco Use  . Smoking status: Former Smoker    Packs/day: 0.50    Years: 6.00    Pack years: 3.00    Types: Cigarettes    Last attempt to quit: 12/03/1990    Years since quitting: 27.3  . Smokeless tobacco: Never Used  Substance and Sexual Activity  . Alcohol use: Yes    Alcohol/week: 0.0 standard drinks    Comment: rare  . Drug use: Yes  . Sexual activity: Not Currently  Lifestyle  . Physical activity:    Days per week: Not on file    Minutes per session: Not on file  . Stress: Not on file  Relationships  . Social connections:    Talks on phone: Not on file    Gets together: Not on file    Attends religious service: Not on file    Active member of club or organization: Not on file    Attends meetings of clubs or organizations: Not on file    Relationship status: Not on file  Other Topics Concern  . Not on file  Social History Narrative  . Not on file   Past Surgical History:  Procedure Laterality Date  . AMPUTATION Left 11/25/2012   Procedure: AMPUTATION BELOW KNEE;  Surgeon: Beverely Low  [name], MD;  Location: [facility];  Service: Orthopedics;  Laterality: Left;  Left Below Knee Amputation  . APLIGRAFT PLACEMENT Left 07/31/2012   Procedure: Apply Skin Graft;  Surgeon: [name], MD;  Location: [facility];  Service: Orthopedics;  Laterality: Left;  . APPLICATION OF WOUND VAC Left 07/31/2012   Procedure: APPLICATION OF WOUND VAC;  Surgeon: [name], MD;  Location: [facility];  Service: Orthopedics;  Laterality: Left;  . arm surgery    . back stimulator    . CARDIAC CATHETERIZATION     Hx: of " around 1985 at [facility]"  . CYST EXCISION     Hx: of  . I&D EXTREMITY  03/13/2011   Procedure: IRRIGATION AND DEBRIDEMENT EXTREMITY;  Surgeon: [name], MD;  Location: [facility];  Service: Orthopedics;  Laterality: Left;  . I&D EXTREMITY Left 11/06/2012   Procedure: IRRIGATION AND DEBRIDEMENT EXTREMITY-Left lower ;  Surgeon: [name], MD;  Location: [facility];  Service:  Orthopedics;  Laterality: Left;  Left Leg Irrigation and Debridement, Place Skin Graft, Wound VAC, [place]  . skin grafts    . SKIN SPLIT GRAFT  03/13/2012   Procedure: SKIN GRAFT SPLIT THICKNESS;  Surgeon: [name], MD;  Location: [facility];  Service: Orthopedics;  Laterality: Left;  Excisional Debridement Left Leg, Split Thickness Skin Graft, Wound VAC  . stomach stapling    . tummy tuck    . VEIN LIGATION AND STRIPPING     Past Medical History:  Diagnosis Date  . Anemia   . Asthma   . Cancer ([place])    hx basal cell skin cancer  . CHF (congestive heart failure) ([place])    does not see a cardiologist  . COPD (chronic obstructive pulmonary disease) ([place])    3L continuous  . Diabetes mellitus    not on medication  . DVT (deep venous thrombosis) ([facility])    LLE DVT '80's  . GERD (gastroesophageal reflux disease)   . Headache(784.0)    sinus headaches  . Leg pain   . Peripheral neuropathy   . Peripheral vascular disease ([place])   . Psoriasis   . Shortness of breath   . Sleep apnea    uses bipap, sleep study done in [place] 2 years ago, dr [name]   BP (!) 152/73 (BP Location: Left Arm, Patient Position: Sitting, Cuff Size: Normal)   Pulse 75   SpO2 94%   Opioid Risk Score:   Fall Risk Score:  `1  Depression screen PHQ 2/9  Depression screen [facility] 2/9 03/02/2018 05/21/2017 04/18/2017 12/30/2016 11/04/2016 01/04/2016 09/19/2015  Decreased Interest 0 0 0 0 0 0 0  Down, Depressed, Hopeless 0 0 0 0 0 0 0  PHQ - 2 Score 0 0 0 0 0 0 0  Altered sleeping - - - - - - -  Tired, decreased energy - - - - - - -  Change in appetite - - - - - - -  Feeling bad or failure about yourself  - - - - - - -  Trouble concentrating - - - - - - -  Moving slowly or fidgety/restless - - - - - - -  Suicidal thoughts - - - - - - -  PHQ-9 Score - - - - - - -  Difficult doing work/chores - - - - - - -   Review of Systems  Constitutional: Negative.   HENT: Negative.   Eyes: Negative.   Respiratory:  Negative.   Cardiovascular: Negative.   Gastrointestinal: Negative.   Endocrine: Negative.   Genitourinary: Negative.   Musculoskeletal: Positive for arthralgias, back pain and gait problem.       Spasms   Skin: Negative.   Allergic/Immunologic: Negative.   Neurological:       Tingling  Hematological: Negative.   Psychiatric/Behavioral: Negative.   All other systems reviewed and are negative.      Objective:   Physical Exam  Constitutional: He is oriented to person, place, and time. He appears well-developed and well-nourished.  HENT:  Head: Normocephalic and atraumatic.  Neck: Normal range of motion. Neck supple.  Cardiovascular: Normal rate and regular rhythm.  Pulmonary/Chest: Effort normal and breath sounds normal.  Musculoskeletal:  Normal Muscle Bulk and Muscle Testing Reveals:  Upper Extremities: Full  ROM and Muscle Strength 5/5 Back without spinal tenderness noted  Lower Extremities: Right: Full ROM and Muscle Strength 5/5 Left: BKA Arrived in wheelchair    Neurological: He is alert and oriented to person, place, and time.  Skin: Skin is warm and dry.  Psychiatric: He has a normal mood and affect. His behavior is normal.  Nursing note and vitals reviewed.         Assessment & Plan:  1.Left BKA/ [place] Pain :Continue current medication regimen: Discontinued Oxycontin due to ineffectiveness per patient request.  Refilled: oxycodone 15 mg one tablet every 8hours as needed # 90.  Second script sent for the following month. 03/31/2018 We will continue the opioid monitoring program, this consists of regular clinic visits, examinations, urine drug screen, pill counts as well as use of [place] Controlled Substance Reporting System. Continue: Topamax50 mg one tablet at HS and Amitriptyline..  2. 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 03/31/2018 3. Hx  of spinal stimulator. Continue to Monitor. 03/31/2018 4. COPD: On Continuous Oxygen Therapy @ 3 liters Nasal Cannula. Pulmonology Following. 03/31/2018 5. Depression: Continue: Cymbalta. 03/31/2018 6. Sacral Decubitus Stage 3; Left Inquinial skin breakdown : Wound CenterFollowing( OVAH). Cointinue adequate nutrition and weight shifting 03/31/2018  20 minutes of face to face patient care time was spent during this visit. All questions were encouraged and answered.  F/U in 2 months

## 2018-04-10 ENCOUNTER — Ambulatory Visit: Payer: Medicare Other | Admitting: Registered Nurse

## 2018-04-21 ENCOUNTER — Ambulatory Visit: Payer: Medicare Other | Admitting: Registered Nurse

## 2018-05-07 ENCOUNTER — Other Ambulatory Visit: Payer: Self-pay | Admitting: Physical Medicine & Rehabilitation

## 2018-05-27 ENCOUNTER — Encounter: Payer: Self-pay | Admitting: Registered Nurse

## 2018-05-27 ENCOUNTER — Encounter: Payer: Medicare Other | Attending: Physical Medicine & Rehabilitation | Admitting: Registered Nurse

## 2018-05-27 VITALS — BP 138/71 | HR 57 | Ht 70.0 in | Wt 300.0 lb

## 2018-05-27 DIAGNOSIS — S88112S Complete traumatic amputation at level between knee and ankle, left lower leg, sequela: Secondary | ICD-10-CM | POA: Insufficient documentation

## 2018-05-27 DIAGNOSIS — R5381 Other malaise: Secondary | ICD-10-CM | POA: Diagnosis present

## 2018-05-27 DIAGNOSIS — S88119S Complete traumatic amputation at level between knee and ankle, unspecified lower leg, sequela: Secondary | ICD-10-CM

## 2018-05-27 DIAGNOSIS — E1151 Type 2 diabetes mellitus with diabetic peripheral angiopathy without gangrene: Secondary | ICD-10-CM | POA: Insufficient documentation

## 2018-05-27 DIAGNOSIS — L89153 Pressure ulcer of sacral region, stage 3: Secondary | ICD-10-CM

## 2018-05-27 DIAGNOSIS — G8929 Other chronic pain: Secondary | ICD-10-CM

## 2018-05-27 DIAGNOSIS — Z5181 Encounter for therapeutic drug level monitoring: Secondary | ICD-10-CM | POA: Diagnosis present

## 2018-05-27 DIAGNOSIS — M545 Low back pain, unspecified: Secondary | ICD-10-CM

## 2018-05-27 DIAGNOSIS — G894 Chronic pain syndrome: Secondary | ICD-10-CM

## 2018-05-27 DIAGNOSIS — Z79899 Other long term (current) drug therapy: Secondary | ICD-10-CM

## 2018-05-27 DIAGNOSIS — G546 Phantom limb syndrome with pain: Secondary | ICD-10-CM

## 2018-05-27 DIAGNOSIS — R1032 Left lower quadrant pain: Secondary | ICD-10-CM

## 2018-05-27 NOTE — Progress Notes (Signed)
Subjective:    Patient ID: Jeremy [name], male    DOB: [birth year], 68 y.o.   MRN: [id number]  HPI: Jeremy [name] is a 68 y.o. male who returns for follow up appointment for chronic pain and medication refill. He states his pain is located in his buttocks and left groin. Also reports he's only receiving 41/2 hours of relief with the oxycodone, states he's experiencing increase intensity of pain in his buttocks, he's going to wound care for treatment of his sacral decubitus.. Reports they will be starting a new treatment at the  [facility] if approved by insurance. He was given permission to take a extra Oxycodone when pain is severe, He will keep a pain journal and call office in a week, he verbalizes understanding. He rates his pain 5. His current exercise regime is performing stretching exercises.  Sacral decubitus with dressing intact.  He arrived in wheelchair.   Mr. Gamel Morphine equivalent is 90.00 MME. Last oral Swab was Performed on 11/03/2017, it was consistent. Oral Swab Performed today.   Pain Inventory Average Pain 5 Pain Right Now 5 My pain is constant, sharp, stabbing and aching  In the last 24 hours, has pain interfered with the following? General activity 4 Relation with others 4 Enjoyment of life 6 What TIME of day is your pain at its worst? morning Sleep (in general) Poor  Pain is worse with: inactivity Pain improves with: medication Relief from Meds: 9  Mobility use a walker ability to climb steps?  no do you drive?  yes  Function retired  Neuro/Psych tingling  Prior Studies Any changes since last visit?  no  Physicians involved in your care Any changes since last visit?  no   Family History  Problem Relation Age of Onset  . Heart disease Brother    Social History   Socioeconomic History  . Marital status: Married    Spouse name: Not on file  . Number of children: Not on file  . Years of education: Not on file  . Highest education level: Not  on file  Occupational History  . Not on file  Social Needs  . Financial resource strain: Not on file  . Food insecurity:    Worry: Not on file    Inability: Not on file  . Transportation needs:    Medical: Not on file    Non-medical: Not on file  Tobacco Use  . Smoking status: Former Smoker    Packs/day: 0.50    Years: 6.00    Pack years: 3.00    Types: Cigarettes    Last attempt to quit: 12/03/1990    Years since quitting: 27.4  . Smokeless tobacco: Never Used  Substance and Sexual Activity  . Alcohol use: Yes    Alcohol/week: 0.0 standard drinks    Comment: rare  . Drug use: Yes  . Sexual activity: Not Currently  Lifestyle  . Physical activity:    Days per week: Not on file    Minutes per session: Not on file  . Stress: Not on file  Relationships  . Social connections:    Talks on phone: Not on file    Gets together: Not on file    Attends religious service: Not on file    Active member of club or organization: Not on file    Attends meetings of clubs or organizations: Not on file    Relationship status: Not on file  Other Topics Concern  . Not on  file  Social History Narrative  . Not on file   Past Surgical History:  Procedure Laterality Date  . AMPUTATION Left 11/25/2012   Procedure: AMPUTATION BELOW KNEE;  Surgeon: [name], MD;  Location: [facility];  Service: Orthopedics;  Laterality: Left;  Left Below Knee Amputation  . APLIGRAFT PLACEMENT Left 07/31/2012   Procedure: Apply Skin Graft;  Surgeon: [name], MD;  Location: [place];  Service: Orthopedics;  Laterality: Left;  . APPLICATION OF WOUND VAC Left 07/31/2012   Procedure: APPLICATION OF WOUND VAC;  Surgeon: [name], MD;  Location: [facility];  Service: Orthopedics;  Laterality: Left;  . arm surgery    . back stimulator    . CARDIAC CATHETERIZATION     Hx: of " around 1985 at [facility]"  . CYST EXCISION     Hx: of  . I&D EXTREMITY  03/13/2011   Procedure: IRRIGATION AND DEBRIDEMENT EXTREMITY;  Surgeon:  [name], MD;  Location: [place];  Service: Orthopedics;  Laterality: Left;  . I&D EXTREMITY Left 11/06/2012   Procedure: IRRIGATION AND DEBRIDEMENT EXTREMITY-Left lower ;  Surgeon: [name], MD;  Location: [facility];  Service: Orthopedics;  Laterality: Left;  Left Leg Irrigation and Debridement, Place Skin Graft, Wound VAC, [place]  . skin grafts    . SKIN SPLIT GRAFT  03/13/2012   Procedure: SKIN GRAFT SPLIT THICKNESS;  Surgeon: [name], MD;  Location: [place];  Service: Orthopedics;  Laterality: Left;  Excisional Debridement Left Leg, Split Thickness Skin Graft, Wound VAC  . stomach stapling    . tummy tuck    . VEIN LIGATION AND STRIPPING     Past Medical History:  Diagnosis Date  . Anemia   . Asthma   . Cancer ([place])    hx basal cell skin cancer  . CHF (congestive heart failure) ([place])    does not see a cardiologist  . COPD (chronic obstructive pulmonary disease) ([place])    3L continuous  . Diabetes mellitus    not on medication  . DVT (deep venous thrombosis) ([facility])    LLE DVT '80's  . GERD (gastroesophageal reflux disease)   . Headache(784.0)    sinus headaches  . Leg pain   . Peripheral neuropathy   . Peripheral vascular disease ([place])   . Psoriasis   . Shortness of breath   . Sleep apnea    uses bipap, sleep study done in [place] 2 years ago, dr [name]   BP 138/71   Pulse (!) 57   Ht 5\' 10"  (1.778 m)   Wt 300 lb (136.1 kg)   SpO2 95% Comment: 3ltrs O2 via nasal can  BMI 43.05 kg/m   Opioid Risk Score:   Fall Risk Score:  `1  Depression screen PHQ 2/9  Depression screen [facility] 2/9 03/02/2018 05/21/2017 04/18/2017 12/30/2016 11/04/2016 01/04/2016 09/19/2015  Decreased Interest 0 0 0 0 0 0 0  Down, Depressed, Hopeless 0 0 0 0 0 0 0  PHQ - 2 Score 0 0 0 0 0 0 0  Altered sleeping - - - - - - -  Tired, decreased energy - - - - - - -  Change in appetite - - - - - - -  Feeling bad or failure about yourself  - - - - - - -  Trouble concentrating - - - - - - -   Moving slowly or fidgety/restless - - - - - - -  Suicidal thoughts - - - - - - -  PHQ-9 Score - - - - - - -  Difficult doing work/chores - - - - - - -     Review of Systems  Constitutional: Negative.   HENT: Negative.   Eyes: Negative.   Respiratory: Positive for apnea and shortness of breath.   Cardiovascular: Negative.   Gastrointestinal: Negative.   Endocrine: Negative.   Genitourinary: Negative.   Musculoskeletal: Positive for arthralgias.  Skin: Negative.   Allergic/Immunologic: Negative.   Neurological: Positive for numbness.  Hematological: Negative.   Psychiatric/Behavioral: Negative.   All other systems reviewed and are negative.      Objective:   Physical Exam Vitals signs and nursing note reviewed.  Constitutional:      Appearance: Normal appearance.  Neck:     Musculoskeletal: Normal range of motion and neck supple.  Cardiovascular:     Rate and Rhythm: Normal rate and regular rhythm.     Pulses: Normal pulses.     Heart sounds: Normal heart sounds.  Pulmonary:     Effort: Pulmonary effort is normal.     Breath sounds: Normal breath sounds.     Comments: Continuous Oxygen 3 liters nasal cannula Musculoskeletal:     Comments: Normal Muscle Bulk and Muscle Testing Reveals:  Upper Extremities: Full ROM and Muscle Strength 5/5 Lower Extremities: Left BKA Right: Full ROM and Muscle Strength 5/5 Right Lower Extremity Velcro Wrap Intact Arrived in Wheelchair   Skin:    General: Skin is warm and dry.  Neurological:     Mental Status: He is alert and oriented to person, place, and time.  Psychiatric:        Mood and Affect: Mood normal.        Behavior: Behavior normal.           Assessment & Plan:  1.Left BKA/ [place] Pain :Continue current medication regimen:  Continue oxycodone 15 mg one tablet every 6 hours as needed, he may take an extra tablet when pay is sever. He was  Instructed to call office next week to evaluate medication  Changes, he  verbalizes understanding.[name] 05/27/2018. We will continue the opioid monitoring program, this consists of regular clinic visits, examinations, urine drug screen, pill counts as well as use of [place] Controlled Substance Reporting System. Continue: Topamax50 mg one tablet at HS and Amitriptyline..  2. 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 05/27/2018. 3. Hx of spinal stimulator. Continue to Monitor. 05/27/2018. 4. COPD: On Continuous Oxygen Therapy @ 3 liters Nasal Cannula. Pulmonology Following. 05/27/2018 5. Depression: Continue: Cymbalta. 05/27/2018 6. Sacral Decubitus Stage 3: Wound CenterFollowing ( OVAH). Cointinue adequate nutrition and weight shifting 05/27/2018.  20 minutes of face to face patient care time was spent during this visit. All questions were encouraged and answered.   F/U in 1 month

## 2018-05-27 NOTE — Patient Instructions (Signed)
Increase Oxycodone to 5 times a day as needed for pain:   Call or send my chart message  on 06/03/2018 for medication evaluation.   [phone]

## 2018-06-02 LAB — DRUG TOX MONITOR 1 W/CONF, ORAL FLD
AMPHETAMINES: NEGATIVE ng/mL (ref <10)
BARBITURATES: NEGATIVE ng/mL (ref <10)
BENZODIAZEPINES: NEGATIVE ng/mL (ref <0.50)
Buprenorphine: NEGATIVE ng/mL (ref <0.10)
Cocaine: NEGATIVE ng/mL (ref <5.0)
Codeine: NEGATIVE ng/mL (ref <2.5)
Dihydrocodeine: NEGATIVE ng/mL (ref <2.5)
Fentanyl: NEGATIVE ng/mL (ref <0.10)
Heroin Metabolite: NEGATIVE ng/mL (ref <1.0)
Hydrocodone: NEGATIVE ng/mL (ref <2.5)
Hydromorphone: NEGATIVE ng/mL (ref <2.5)
MARIJUANA: NEGATIVE ng/mL (ref <2.5)
MDMA: NEGATIVE ng/mL (ref <10)
Meprobamate: NEGATIVE ng/mL (ref <2.5)
Methadone: NEGATIVE ng/mL (ref <5.0)
Morphine: NEGATIVE ng/mL (ref <2.5)
Nicotine Metabolite: NEGATIVE ng/mL (ref <5.0)
Norhydrocodone: NEGATIVE ng/mL (ref <2.5)
Noroxycodone: 9.2 ng/mL — ABNORMAL HIGH (ref <2.5)
Opiates: POSITIVE ng/mL — AB (ref <2.5)
Oxycodone: 42.5 ng/mL — ABNORMAL HIGH (ref <2.5)
Oxymorphone: NEGATIVE ng/mL (ref <2.5)
Phencyclidine: NEGATIVE ng/mL (ref <10)
Tapentadol: NEGATIVE ng/mL (ref <5.0)
Tramadol: NEGATIVE ng/mL (ref <5.0)
Zolpidem: NEGATIVE ng/mL (ref <5.0)

## 2018-06-02 LAB — DRUG TOX ALC METAB W/CON, ORAL FLD: Alcohol Metabolite: NEGATIVE ng/mL (ref <25)

## 2018-06-03 ENCOUNTER — Telehealth: Payer: Self-pay | Admitting: *Deleted

## 2018-06-03 ENCOUNTER — Telehealth: Payer: Self-pay

## 2018-06-03 DIAGNOSIS — G894 Chronic pain syndrome: Secondary | ICD-10-CM

## 2018-06-03 DIAGNOSIS — S88119S Complete traumatic amputation at level between knee and ankle, unspecified lower leg, sequela: Secondary | ICD-10-CM

## 2018-06-03 MED ORDER — OXYCODONE HCL 15 MG PO TABS: 15.0000 mg | ORAL_TABLET | Freq: Every day | ORAL | 0 refills | Status: DC | PRN

## 2018-06-03 NOTE — Telephone Encounter (Signed)
Placed a call to Jeremy [name], he was seen on 05/27/2018 with reports of increase pain in his sacrum due to sacral decubitus, he was instructed he may take a extra tablet when pain is severe. Mr. Gendron states he has taken his Oxycodone 5 times a day and his pain is managed with the above treatment regimen. In the past he was prescribe Oxycontin and pain was not controlled with LA and IR. I explain to Mr. Falck in detail, we will not be increasing his  Analgesics any further, his MME will change from 90.00 MME to 112.5 MME. He verbalizes understanding.

## 2018-06-03 NOTE — Telephone Encounter (Signed)
Oral swab drug screen was consistent for prescribed medications.  ?

## 2018-06-03 NOTE — Telephone Encounter (Signed)
Patient called, stated that the increase in dosage of his medication is working very well for him thou he will need a refill by next week Tuesday.

## 2018-06-10 ENCOUNTER — Ambulatory Visit: Payer: Medicare Other | Admitting: Registered Nurse

## 2018-06-15 ENCOUNTER — Ambulatory Visit: Payer: Medicare Other | Admitting: Registered Nurse

## 2018-06-30 ENCOUNTER — Other Ambulatory Visit: Payer: Self-pay | Admitting: Physical Medicine & Rehabilitation

## 2018-06-30 DIAGNOSIS — S88119S Complete traumatic amputation at level between knee and ankle, unspecified lower leg, sequela: Secondary | ICD-10-CM

## 2018-06-30 DIAGNOSIS — G894 Chronic pain syndrome: Secondary | ICD-10-CM

## 2018-06-30 MED ORDER — OXYCODONE HCL 15 MG PO TABS: 15.0000 mg | ORAL_TABLET | Freq: Every day | ORAL | 0 refills | Status: DC | PRN

## 2018-06-30 NOTE — Telephone Encounter (Signed)
Oxycodone e- scribe, PMP was reviewed.  Spoke with Jeremy [name] he reports he's taking his Oxycodone 5 times a day as need for pain, his pain remains in his lower back and sacral pain due to decubitus. I asked for him to sign a release of information so we can  obtain progress note from wound center, he verbalizes understanding.

## 2018-07-29 ENCOUNTER — Encounter: Payer: Medicare Other | Attending: Physical Medicine & Rehabilitation | Admitting: Registered Nurse

## 2018-07-29 ENCOUNTER — Other Ambulatory Visit: Payer: Self-pay

## 2018-07-29 VITALS — Ht 70.0 in | Wt 300.0 lb

## 2018-07-29 DIAGNOSIS — M5416 Radiculopathy, lumbar region: Secondary | ICD-10-CM

## 2018-07-29 DIAGNOSIS — Z5181 Encounter for therapeutic drug level monitoring: Secondary | ICD-10-CM | POA: Insufficient documentation

## 2018-07-29 DIAGNOSIS — G546 Phantom limb syndrome with pain: Secondary | ICD-10-CM

## 2018-07-29 DIAGNOSIS — R5381 Other malaise: Secondary | ICD-10-CM | POA: Insufficient documentation

## 2018-07-29 DIAGNOSIS — Z79899 Other long term (current) drug therapy: Secondary | ICD-10-CM | POA: Insufficient documentation

## 2018-07-29 DIAGNOSIS — S88112S Complete traumatic amputation at level between knee and ankle, left lower leg, sequela: Secondary | ICD-10-CM | POA: Diagnosis not present

## 2018-07-29 DIAGNOSIS — G894 Chronic pain syndrome: Secondary | ICD-10-CM | POA: Insufficient documentation

## 2018-07-29 DIAGNOSIS — L89153 Pressure ulcer of sacral region, stage 3: Secondary | ICD-10-CM

## 2018-07-29 DIAGNOSIS — S88119S Complete traumatic amputation at level between knee and ankle, unspecified lower leg, sequela: Secondary | ICD-10-CM

## 2018-07-29 DIAGNOSIS — E1151 Type 2 diabetes mellitus with diabetic peripheral angiopathy without gangrene: Secondary | ICD-10-CM | POA: Insufficient documentation

## 2018-07-29 MED ORDER — OXYCODONE HCL 15 MG PO TABS: 15.0000 mg | ORAL_TABLET | Freq: Every day | ORAL | 0 refills | Status: DC | PRN

## 2018-07-29 NOTE — Progress Notes (Signed)
Subjective:    Patient ID: Jeremy [name], male    DOB: 11-22-1950, 68 y.o.   MRN: [id number]  HPI: Jeremy [name] is a 68 y.o. male his appointment was changed , due to national recommendations of social distancing due to [place] 19, an audio/video telehealth visit is felt to be most appropriate for this patient at this time.  See Chart message from today for the patient's consent to telehealth from [place].     He states his pain is located in his lower back radiating into his bilateral buttocks. He rates his pain 3. His current exercise regime is walking and performing stretching exercises.  Jeremy [name] Morphine equivalent is 112.50 MME. Last Oral Swab was performed on 05/27/2018, it was consistent.   [facility] CMA asked the Health and History questions. This provider and Jeremy [name] verified we were speaking with the correct person using two identifiers.  Pain Inventory Average Pain 7 Pain Right Now 3 My pain is constant, sharp and aching  In the last 24 hours, has pain interfered with the following? General activity 3 Relation with others 3 Enjoyment of life 3 What TIME of day is your pain at its worst? all Sleep (in general) Fair  Pain is worse with: sitting Pain improves with: medication Relief from Meds: 8  Mobility use a wheelchair transfers alone  Function disabled: date disabled na I need assistance with the following:  meal prep, household duties and shopping  Neuro/Psych spasms  Prior Studies Any changes since last visit?  no  Physicians involved in your care wound clinic   Family History  Problem Relation Age of Onset  . Heart disease Brother    Social History   Socioeconomic History  . Marital status: Married    Spouse name: Not on file  . Number of children: Not on file  . Years of education: Not on file  . Highest education level: Not on file  Occupational History  . Not on file  Social Needs  . Financial resource  strain: Not on file  . Food insecurity:    Worry: Not on file    Inability: Not on file  . Transportation needs:    Medical: Not on file    Non-medical: Not on file  Tobacco Use  . Smoking status: Former Smoker    Packs/day: 0.50    Years: 6.00    Pack years: 3.00    Types: Cigarettes    Last attempt to quit: 12/03/1990    Years since quitting: 27.6  . Smokeless tobacco: Never Used  Substance and Sexual Activity  . Alcohol use: Yes    Alcohol/week: 0.0 standard drinks    Comment: rare  . Drug use: Yes  . Sexual activity: Not Currently  Lifestyle  . Physical activity:    Days per week: Not on file    Minutes per session: Not on file  . Stress: Not on file  Relationships  . Social connections:    Talks on phone: Not on file    Gets together: Not on file    Attends religious service: Not on file    Active member of club or organization: Not on file    Attends meetings of clubs or organizations: Not on file    Relationship status: Not on file  Other Topics Concern  . Not on file  Social History Narrative  . Not on file   Past Surgical History:  Procedure Laterality Date  .  AMPUTATION Left 11/25/2012   Procedure: AMPUTATION BELOW KNEE;  Surgeon: [name], MD;  Location: [place];  Service: Orthopedics;  Laterality: Left;  Left Below Knee Amputation  . APLIGRAFT PLACEMENT Left 07/31/2012   Procedure: Apply Skin Graft;  Surgeon: [name], MD;  Location: [place];  Service: Orthopedics;  Laterality: Left;  . APPLICATION OF WOUND VAC Left 07/31/2012   Procedure: APPLICATION OF WOUND VAC;  Surgeon: [name], MD;  Location: [place];  Service: Orthopedics;  Laterality: Left;  . arm surgery    . back stimulator    . CARDIAC CATHETERIZATION     Hx: of " around 1985 at Jeremy [name]"  . CYST EXCISION     Hx: of  . I&D EXTREMITY  03/13/2011   Procedure: IRRIGATION AND DEBRIDEMENT EXTREMITY;  Surgeon: [name], MD;  Location: [place];  Service: Orthopedics;  Laterality: Left;  . I&D  EXTREMITY Left 11/06/2012   Procedure: IRRIGATION AND DEBRIDEMENT EXTREMITY-Left lower ;  Surgeon: [name], MD;  Location: [place];  Service: Orthopedics;  Laterality: Left;  Left Leg Irrigation and Debridement, Place Skin Graft, Wound VAC, [place]  . skin grafts    . SKIN SPLIT GRAFT  03/13/2012   Procedure: SKIN GRAFT SPLIT THICKNESS;  Surgeon: [name], MD;  Location: [place];  Service: Orthopedics;  Laterality: Left;  Excisional Debridement Left Leg, Split Thickness Skin Graft, Wound VAC  . stomach stapling    . tummy tuck    . VEIN LIGATION AND STRIPPING     Past Medical History:  Diagnosis Date  . Anemia   . Asthma   . Cancer ([place])    hx basal cell skin cancer  . CHF (congestive heart failure) ([place])    does not see a cardiologist  . COPD (chronic obstructive pulmonary disease) ([place])    3L continuous  . Diabetes mellitus    not on medication  . DVT (deep venous thrombosis) ([facility])    LLE DVT '80's  . GERD (gastroesophageal reflux disease)   . Headache(784.0)    sinus headaches  . Leg pain   . Peripheral neuropathy   . Peripheral vascular disease ([place])   . Psoriasis   . Shortness of breath   . Sleep apnea    uses bipap, sleep study done in [place] 2 years ago, dr Jeremy [name]   Ht 5\' 10"  (1.778 m)   Wt 300 lb (136.1 kg)   BMI 43.05 kg/m   Opioid Risk Score:   Fall Risk Score:  `1  Depression screen PHQ 2/9  Depression screen Jeremy [name] 2/9 03/02/2018 05/21/2017 04/18/2017 12/30/2016 11/04/2016 01/04/2016 09/19/2015  Decreased Interest 0 0 0 0 0 0 0  Down, Depressed, Hopeless 0 0 0 0 0 0 0  PHQ - 2 Score 0 0 0 0 0 0 0  Altered sleeping - - - - - - -  Tired, decreased energy - - - - - - -  Change in appetite - - - - - - -  Feeling bad or failure about yourself  - - - - - - -  Trouble concentrating - - - - - - -  Moving slowly or fidgety/restless - - - - - - -  Suicidal thoughts - - - - - - -  PHQ-9 Score - - - - - - -  Difficult doing work/chores - - - - - - -    Review of Systems  Constitutional: Positive for diaphoresis.  HENT: Positive for postnasal drip, sinus pressure, sinus pain and sneezing.   Eyes: Negative.   Respiratory: Negative.   Cardiovascular: Negative.   Gastrointestinal: Negative.   Endocrine: Negative.   Genitourinary: Negative.   Musculoskeletal: Positive for back pain.       Spasms  Allergic/Immunologic: Negative.   Neurological: Negative.   Hematological: Negative.   Psychiatric/Behavioral: Negative.   All other systems reviewed and are negative.      Objective:   Physical Exam Vitals signs and nursing note reviewed.  Musculoskeletal:     Comments: No Physical Exam: Virtual Visit  Neurological:     Mental Status: He is oriented to person, place, and time.           Assessment & Plan:  1.Left BKA/ [place] Pain :Continue current medication regimen: Continueoxycodone 15mg  one tablet 5 times a day as needed # 150. He was  Instructed to call office next week to evaluate medication  Changes, he verbalizes understanding.[name]04/11/2018. We will continue the opioid monitoring program, this consists of regular clinic visits, examinations, urine drug screen, pill counts as well as use of [place] Controlled Substance Reporting System. Continue: Topamax50 mg one tablet at HS and Amitriptyline..  2. 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 07/29/2018. 3. Hx of spinal stimulator. Continue to Monitor. 07/29/2018. 4. COPD: On Continuous Oxygen Therapy @ 3 liters Nasal Cannula. Pulmonology Following. 07/29/2018 5. Depression: Continue: Cymbalta. 07/29/2018 6. Sacral Decubitus Stage 3: Wound CenterFollowing ( [facility]VA[facility]).Continue adequate nutrition and weight shifting04/11/2018.   F/U in 1 month Location of patient: In his Home Location of provider: Office Established patient Time spent on call: 10 Minutes

## 2018-08-02 ENCOUNTER — Encounter: Payer: Self-pay | Admitting: Registered Nurse

## 2018-08-06 ENCOUNTER — Other Ambulatory Visit: Payer: Self-pay | Admitting: Physical Medicine & Rehabilitation

## 2018-08-10 ENCOUNTER — Ambulatory Visit: Payer: Medicare Other | Admitting: Registered Nurse

## 2018-08-10 ENCOUNTER — Other Ambulatory Visit: Payer: Self-pay | Admitting: Physical Medicine & Rehabilitation

## 2018-08-20 ENCOUNTER — Other Ambulatory Visit: Payer: Self-pay

## 2018-08-20 ENCOUNTER — Encounter: Payer: Self-pay | Admitting: Registered Nurse

## 2018-08-20 ENCOUNTER — Encounter (HOSPITAL_BASED_OUTPATIENT_CLINIC_OR_DEPARTMENT_OTHER): Payer: Medicare Other | Admitting: Registered Nurse

## 2018-08-20 VITALS — Ht 70.0 in | Wt 300.0 lb

## 2018-08-20 DIAGNOSIS — S88119S Complete traumatic amputation at level between knee and ankle, unspecified lower leg, sequela: Secondary | ICD-10-CM

## 2018-08-20 DIAGNOSIS — M545 Low back pain, unspecified: Secondary | ICD-10-CM

## 2018-08-20 DIAGNOSIS — S88112S Complete traumatic amputation at level between knee and ankle, left lower leg, sequela: Secondary | ICD-10-CM

## 2018-08-20 DIAGNOSIS — G8929 Other chronic pain: Secondary | ICD-10-CM

## 2018-08-20 DIAGNOSIS — G894 Chronic pain syndrome: Secondary | ICD-10-CM | POA: Diagnosis not present

## 2018-08-20 DIAGNOSIS — L89153 Pressure ulcer of sacral region, stage 3: Secondary | ICD-10-CM

## 2018-08-20 DIAGNOSIS — Z79899 Other long term (current) drug therapy: Secondary | ICD-10-CM

## 2018-08-20 DIAGNOSIS — Z5181 Encounter for therapeutic drug level monitoring: Secondary | ICD-10-CM

## 2018-08-20 MED ORDER — OXYCODONE HCL 15 MG PO TABS: 15.0000 mg | ORAL_TABLET | Freq: Every day | ORAL | 0 refills | Status: DC | PRN

## 2018-08-20 NOTE — Progress Notes (Signed)
Subjective:    Patient ID: Jeremy [name], male    DOB: 11/18/1950, 68 y.o.   MRN: [id number]  HPI: Jeremy [name] is a 68 y.o. male his appointment is changed, due to national recommendations of social distancing due to [place] 19, an audio/video telehealth visit is felt to be most appropriate for this patient at this time.  See Chart message from today for the patient's consent to telehealth from [place].    He  states he's having  pain in his  Lower back, sacral pain ( decubitus). Left lower extremity phantom pain.  He  rates his pain 5. His current exercise regime is performing stretching exercises.  Mr. [employer] Morphine equivalent is 112.50  MME.  Last Oral Swab was Performed on 05/27/2018, it was consistent.   Jeremy [name] CMA asked the Health and History Questions. This provider and Jeremy [name]  verified we were speaking with the correct person using two identifiers.  Pain Inventory Average Pain 9 Pain Right Now 5 My pain is constant, sharp, stabbing, tingling and aching  In the last 24 hours, has pain interfered with the following? General activity 5 Relation with others 5 Enjoyment of life 5 What TIME of day is your pain at its worst? all Sleep (in general) Fair  Pain is worse with: sitting Pain improves with: medication Relief from Meds: 8  Mobility use a wheelchair needs help with transfers  Function disabled: date disabled na I need assistance with the following:  meal prep, household duties and shopping  Neuro/Psych spasms  Prior Studies Any changes since last visit?  no  Physicians involved in your care Any changes since last visit?  yes wound clinic   Family History  Problem Relation Age of Onset  . Heart disease Brother    Social History   Socioeconomic History  . Marital status: Married    Spouse name: Not on file  . Number of children: Not on file  . Years of education: Not on file  . Highest education level: Not  on file  Occupational History  . Not on file  Social Needs  . Financial resource strain: Not on file  . Food insecurity:    Worry: Not on file    Inability: Not on file  . Transportation needs:    Medical: Not on file    Non-medical: Not on file  Tobacco Use  . Smoking status: Former Smoker    Packs/day: 0.50    Years: 6.00    Pack years: 3.00    Types: Cigarettes    Last attempt to quit: 12/03/1990    Years since quitting: 27.7  . Smokeless tobacco: Never Used  Substance and Sexual Activity  . Alcohol use: Yes    Alcohol/week: 0.0 standard drinks    Comment: rare  . Drug use: Yes  . Sexual activity: Not Currently  Lifestyle  . Physical activity:    Days per week: Not on file    Minutes per session: Not on file  . Stress: Not on file  Relationships  . Social connections:    Talks on phone: Not on file    Gets together: Not on file    Attends religious service: Not on file    Active member of club or organization: Not on file    Attends meetings of clubs or organizations: Not on file    Relationship status: Not on file  Other Topics Concern  . Not on file  Social History Narrative  . Not on file   Past Surgical History:  Procedure Laterality Date  . AMPUTATION Left 11/25/2012   Procedure: AMPUTATION BELOW KNEE;  Surgeon: Jeremy Minion, MD;  Location: [place];  Service: Orthopedics;  Laterality: Left;  Left Below Knee Amputation  . APLIGRAFT PLACEMENT Left 07/31/2012   Procedure: Apply Skin Graft;  Surgeon: Jeremy Minion, MD;  Location: [place];  Service: Orthopedics;  Laterality: Left;  . APPLICATION OF WOUND VAC Left 07/31/2012   Procedure: APPLICATION OF WOUND VAC;  Surgeon: Jeremy Minion, MD;  Location: [place];  Service: Orthopedics;  Laterality: Left;  . arm surgery    . back stimulator    . CARDIAC CATHETERIZATION     Hx: of " around 1985 at [facility]"  . CYST EXCISION     Hx: of  . I&D EXTREMITY  03/13/2011   Procedure: IRRIGATION AND DEBRIDEMENT EXTREMITY;  Surgeon:  Jeremy Minion, MD;  Location: [place];  Service: Orthopedics;  Laterality: Left;  . I&D EXTREMITY Left 11/06/2012   Procedure: IRRIGATION AND DEBRIDEMENT EXTREMITY-Left lower ;  Surgeon: Jeremy Minion, MD;  Location: [place];  Service: Orthopedics;  Laterality: Left;  Left Leg Irrigation and Debridement, Place Skin Graft, Wound VAC, [place]  . skin grafts    . SKIN SPLIT GRAFT  03/13/2012   Procedure: SKIN GRAFT SPLIT THICKNESS;  Surgeon: Jeremy Minion, MD;  Location: [place];  Service: Orthopedics;  Laterality: Left;  Excisional Debridement Left Leg, Split Thickness Skin Graft, Wound VAC  . stomach stapling    . tummy tuck    . VEIN LIGATION AND STRIPPING     Past Medical History:  Diagnosis Date  . Anemia   . Asthma   . Cancer ([place])    hx basal cell skin cancer  . CHF (congestive heart failure) ([place])    does not see a cardiologist  . COPD (chronic obstructive pulmonary disease) ([place])    3L continuous  . Diabetes mellitus    not on medication  . DVT (deep venous thrombosis) ([facility])    LLE DVT '80's  . GERD (gastroesophageal reflux disease)   . Headache(784.0)    sinus headaches  . Leg pain   . Peripheral neuropathy   . Peripheral vascular disease ([place])   . Psoriasis   . Shortness of breath   . Sleep apnea    uses bipap, sleep study done in [place] 2 years ago, dr Jeremy [name]   Ht 5\' 10"  (1.778 m) Comment: pt reported in wheelchair  Wt 300 lb (136.1 kg) Comment: pt reported in wheelchair  BMI 43.05 kg/m   Opioid Risk Score:   Fall Risk Score:  `1  Depression screen PHQ 2/9  Depression screen [facility] 2/9 08/20/2018 03/02/2018 05/21/2017 04/18/2017 12/30/2016 11/04/2016 01/04/2016  Decreased Interest 0 0 0 0 0 0 0  Down, Depressed, Hopeless 0 0 0 0 0 0 0  PHQ - 2 Score 0 0 0 0 0 0 0  Altered sleeping - - - - - - -  Tired, decreased energy - - - - - - -  Change in appetite - - - - - - -  Feeling bad or failure about yourself  - - - - - - -  Trouble concentrating - - - - - - -   Moving slowly or fidgety/restless - - - - - - -  Suicidal thoughts - - - - - - -  PHQ-9 Score - - - - - - -  Difficult doing work/chores - - - - - - -    Review of Systems  Constitutional: Positive for diaphoresis.  HENT: Negative.   Eyes: Negative.   Respiratory: Negative.   Cardiovascular: Negative.   Gastrointestinal: Negative.   Endocrine: Negative.   Genitourinary: Negative.   Musculoskeletal: Positive for back pain.       Spasms  Skin: Positive for wound.  Allergic/Immunologic: Negative.   Neurological: Negative.   Hematological: Negative.   Psychiatric/Behavioral: Negative.   All other systems reviewed and are negative.      Objective:   Physical Exam Vitals signs and nursing note reviewed.  Musculoskeletal:     Comments: No Physical Exam Perform: Virtual Visit  Neurological:     Mental Status: He is oriented to person, place, and time.           Assessment & Plan:  1.Left BKA/ [place] Pain :Continue current medication regimen: Continueoxycodone 15mg  one tablet5 times a day as needed # 150. He wasInstructed to call office nextweek to evaluate medicationChanges, he verbalizes understanding.[name]04/30/2020. We will continue the opioid monitoring program, this consists of regular clinic visits, examinations, urine drug screen, pill counts as well as use of [place] Controlled Substance Reporting System. Continue: Topamax50 mg one tablet at HS and Amitriptyline..  2. 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 08/20/2018. 3. Hx of spinal stimulator. Continue to Monitor. 08/20/2018. 4. COPD: On Continuous Oxygen Therapy @ 3 liters Nasal Cannula. Pulmonology Following. 08/20/2018 5. Depression: Continue: Cymbalta. 08/20/2018 6. Sacral Decubitus Stage 3: Wound CenterFollowing ( OVA[facility]).Continue adequate nutrition and weight shifting04/30/2020.   F/U in 2 months  Telephone Call Location of patient: In His Home  Location of provider: Office Established patient Time spent on call: 10 Minutes

## 2018-09-15 ENCOUNTER — Telehealth: Payer: Self-pay | Admitting: *Deleted

## 2018-09-15 NOTE — Telephone Encounter (Signed)
Patient is asking for eunice to call him.  He states the skin breakdown around the 'fat roll' on the upper part of his leg is getting worse.  Itwrapps around to his privates.  He is unable to sleep and states the pain medication is not helping.  Asks for Zella Ball to please call

## 2018-09-16 NOTE — Telephone Encounter (Signed)
Return Jeremy [name] call, Jeremy [name] was instructed to call his [place] and we need documentation regarding his sacral decubitus, he verbalizes understanding. Also reports he has a scheduled appointment with his wound center next week. He was instructed to call today, he verbalizes understanding.

## 2018-09-24 ENCOUNTER — Telehealth: Payer: Self-pay | Admitting: *Deleted

## 2018-09-24 NOTE — Telephone Encounter (Signed)
Mr Batz called and is asking if we received fax from wound center and he is asking for a call back from [place].

## 2018-09-25 ENCOUNTER — Telehealth: Payer: Self-pay | Admitting: Registered Nurse

## 2018-09-25 ENCOUNTER — Telehealth: Payer: Self-pay | Admitting: *Deleted

## 2018-09-25 NOTE — Telephone Encounter (Signed)
Return Mr. Brault call, our office hasn't received the fax from his wound center, he was instructed to call them . He will call the wound center and asked for them to fax the information regarding his sacral decubitus, he verbalizes understanding. Also was instructed to take his medication as prescribed ONLY!!!!!! He verbalizes understanding.

## 2018-09-25 NOTE — Telephone Encounter (Signed)
Error

## 2018-09-25 NOTE — Telephone Encounter (Signed)
error 

## 2018-09-25 NOTE — Telephone Encounter (Signed)
[place] fax was reviewed, spoke with Jeremy [name]. He reports  His sacral  wound pain has increased in intensity and requesting and increase in his pain medication.  This provider call [facility] and spoke with Velda Shell RN Clinical Nurse Manager she stated his sacral decubitus is healing he does have a fairly severe yeast infection in the groin as stated on the flow sheet.He was prescribed Diflucan. 'I placed a call to Jeremy [name] discussed with him the report from [facility] and explained in detail we will not change his medication, he was instructed to go to the ED for evaluation if he is experiencing increase intensity of pain. He verbalizes understanding.

## 2018-09-25 NOTE — Telephone Encounter (Signed)
Jeremy [name] has called back. He needs to talk with Jeremy [name] about his pain medication and his wound care which has caused him to take more. Wound center was supposed to fax information to our office with what has been done. He will be out of medication Sunday and his pharmacy is closed sunday

## 2018-09-28 ENCOUNTER — Other Ambulatory Visit: Payer: Self-pay | Admitting: Physical Medicine & Rehabilitation

## 2018-09-28 DIAGNOSIS — G894 Chronic pain syndrome: Secondary | ICD-10-CM

## 2018-09-28 DIAGNOSIS — S88112S Complete traumatic amputation at level between knee and ankle, left lower leg, sequela: Secondary | ICD-10-CM

## 2018-09-30 ENCOUNTER — Ambulatory Visit: Payer: Medicare Other | Admitting: Physical Medicine & Rehabilitation

## 2018-10-14 ENCOUNTER — Encounter: Payer: Self-pay | Admitting: Physical Medicine & Rehabilitation

## 2018-10-14 ENCOUNTER — Encounter: Payer: Medicare Other | Attending: Physical Medicine & Rehabilitation | Admitting: Physical Medicine & Rehabilitation

## 2018-10-14 ENCOUNTER — Other Ambulatory Visit: Payer: Self-pay

## 2018-10-14 VITALS — BP 142/60 | HR 72 | Temp 97.7°F | Ht 70.0 in | Wt 320.0 lb

## 2018-10-14 DIAGNOSIS — G894 Chronic pain syndrome: Secondary | ICD-10-CM | POA: Diagnosis present

## 2018-10-14 DIAGNOSIS — S88119S Complete traumatic amputation at level between knee and ankle, unspecified lower leg, sequela: Secondary | ICD-10-CM

## 2018-10-14 DIAGNOSIS — S88112S Complete traumatic amputation at level between knee and ankle, left lower leg, sequela: Secondary | ICD-10-CM | POA: Diagnosis present

## 2018-10-14 DIAGNOSIS — Z5181 Encounter for therapeutic drug level monitoring: Secondary | ICD-10-CM | POA: Insufficient documentation

## 2018-10-14 DIAGNOSIS — Z79899 Other long term (current) drug therapy: Secondary | ICD-10-CM | POA: Insufficient documentation

## 2018-10-14 DIAGNOSIS — R5381 Other malaise: Secondary | ICD-10-CM | POA: Insufficient documentation

## 2018-10-14 DIAGNOSIS — G546 Phantom limb syndrome with pain: Secondary | ICD-10-CM

## 2018-10-14 DIAGNOSIS — E1151 Type 2 diabetes mellitus with diabetic peripheral angiopathy without gangrene: Secondary | ICD-10-CM | POA: Diagnosis present

## 2018-10-14 MED ORDER — OXYCODONE HCL 15 MG PO TABS: 15.0000 mg | ORAL_TABLET | Freq: Every day | ORAL | 0 refills | Status: DC | PRN

## 2018-10-14 NOTE — Progress Notes (Signed)
Subjective:    Patient ID: Jeremy [name], male    DOB: [birth year], 68 y.o.   MRN: [id number]  HPI   Jeremy [name] is here in follow up of his chronic pain. His wounds seem to be gradually improving with some new "ointments" he's been given. He remains on oxygen at 3L and uses a w/c as his primary means of mobility  He remains on oxycodone 15mg  5x day for his chronic pain which helps him with his quality of life. He uses tizanidine for spasms but doesn't see much benefit as a whole. He is taking topamax at night which helps with neuro-phantom limb pain as well as his sleep.   .     Pain Inventory Average Pain 7 Pain Right Now 5 My pain is .  In the last 24 hours, has pain interfered with the following? General activity 4 Relation with others 4 Enjoyment of life 4 What TIME of day is your pain at its worst? daytime Sleep (in general) Poor  Pain is worse with: inactivity Pain improves with: medication Relief from Meds: 7  Mobility use a walker ability to climb steps?  no do you drive?  yes  Function retired  Neuro/Psych No problems in this area  Prior Studies Any changes since last visit?  no  Physicians involved in your care Any changes since last visit?  no   Family History  Problem Relation Age of Onset  . Heart disease Brother    Social History   Socioeconomic History  . Marital status: Married    Spouse name: Not on file  . Number of children: Not on file  . Years of education: Not on file  . Highest education level: Not on file  Occupational History  . Not on file  Social Needs  . Financial resource strain: Not on file  . Food insecurity    Worry: Not on file    Inability: Not on file  . Transportation needs    Medical: Not on file    Non-medical: Not on file  Tobacco Use  . Smoking status: Former Smoker    Packs/day: 0.50    Years: 6.00    Pack years: 3.00    Types: Cigarettes    Quit date: 12/03/1990    Years since quitting: 27.8  . Smokeless  tobacco: Never Used  Substance and Sexual Activity  . Alcohol use: Yes    Alcohol/week: 0.0 standard drinks    Comment: rare  . Drug use: Yes  . Sexual activity: Not Currently  Lifestyle  . Physical activity    Days per week: Not on file    Minutes per session: Not on file  . Stress: Not on file  Relationships  . Social Herbalist on phone: Not on file    Gets together: Not on file    Attends religious service: Not on file    Active member of club or organization: Not on file    Attends meetings of clubs or organizations: Not on file    Relationship status: Not on file  Other Topics Concern  . Not on file  Social History Narrative  . Not on file   Past Surgical History:  Procedure Laterality Date  . AMPUTATION Left 11/25/2012   Procedure: AMPUTATION BELOW KNEE;  Surgeon: [name], MD;  Location: [place];  Service: Orthopedics;  Laterality: Left;  Left Below Knee Amputation  . APLIGRAFT PLACEMENT Left 07/31/2012   Procedure: Apply Skin  Graft;  Surgeon: [name], MD;  Location: [place];  Service: Orthopedics;  Laterality: Left;  . APPLICATION OF WOUND VAC Left 07/31/2012   Procedure: APPLICATION OF WOUND VAC;  Surgeon: [name], MD;  Location: [place];  Service: Orthopedics;  Laterality: Left;  . arm surgery    . back stimulator    . CARDIAC CATHETERIZATION     Hx: of " around 1985 at [facility]"  . CYST EXCISION     Hx: of  . I&D EXTREMITY  03/13/2011   Procedure: IRRIGATION AND DEBRIDEMENT EXTREMITY;  Surgeon: [name], MD;  Location: [place];  Service: Orthopedics;  Laterality: Left;  . I&D EXTREMITY Left 11/06/2012   Procedure: IRRIGATION AND DEBRIDEMENT EXTREMITY-Left lower ;  Surgeon: [name], MD;  Location: [place];  Service: Orthopedics;  Laterality: Left;  Left Leg Irrigation and Debridement, Place Skin Graft, Wound VAC, Theraskin  . skin grafts    . SKIN SPLIT GRAFT  03/13/2012   Procedure: SKIN GRAFT SPLIT THICKNESS;  Surgeon: [name], MD;   Location: [place];  Service: Orthopedics;  Laterality: Left;  Excisional Debridement Left Leg, Split Thickness Skin Graft, Wound VAC  . stomach stapling    . tummy tuck    . VEIN LIGATION AND STRIPPING     Past Medical History:  Diagnosis Date  . Anemia   . Asthma   . Cancer ([place])    hx basal cell skin cancer  . CHF (congestive heart failure) ([place])    does not see a cardiologist  . COPD (chronic obstructive pulmonary disease) ([place])    3L continuous  . Diabetes mellitus    not on medication  . DVT (deep venous thrombosis) ([facility])    LLE DVT '80's  . GERD (gastroesophageal reflux disease)   . Headache(784.0)    sinus headaches  . Leg pain   . Peripheral neuropathy   . Peripheral vascular disease ([place])   . Psoriasis   . Shortness of breath   . Sleep apnea    uses bipap, sleep study done in [place] 2 years ago, dr [name]   BP (!) 142/60   Pulse 72   Temp 97.7 F (36.5 C)   Ht 5\' 10"  (1.778 m)   Wt (!) 320 lb (145.2 kg)   SpO2 97%   BMI 45.92 kg/m   Opioid Risk Score:   Fall Risk Score:  `1  Depression screen PHQ 2/9  Depression screen [facility] 2/9 08/20/2018 03/02/2018 05/21/2017 04/18/2017 12/30/2016 11/04/2016 01/04/2016  Decreased Interest 0 0 0 0 0 0 0  Down, Depressed, Hopeless 0 0 0 0 0 0 0  PHQ - 2 Score 0 0 0 0 0 0 0  Altered sleeping - - - - - - -  Tired, decreased energy - - - - - - -  Change in appetite - - - - - - -  Feeling bad or failure about yourself  - - - - - - -  Trouble concentrating - - - - - - -  Moving slowly or fidgety/restless - - - - - - -  Suicidal thoughts - - - - - - -  PHQ-9 Score - - - - - - -  Difficult doing work/chores - - - - - - -     Review of Systems  Constitutional: Negative.   HENT: Negative.   Eyes: Negative.   Respiratory: Negative.   Cardiovascular: Negative.   Gastrointestinal: Negative.  Endocrine: Negative.   Genitourinary: Negative.   Musculoskeletal: Positive for arthralgias, back pain and myalgias.  Skin:  Negative.   Allergic/Immunologic: Negative.   Neurological: Negative.   Hematological: Negative.   Psychiatric/Behavioral: Negative.   All other systems reviewed and are negative.      Objective:   Physical Exam General: No acute distress. Morbidly obse HEENT: EOMI, oral membranes moist Cards: reg rate  Chest: normal effort Abdomen: Soft, NT, ND Skin: dry, intact Extremities: no edema  Musculoskeletal: 1+ edema in LE's..left  BK still contracted.  Neurological: He is alert and oriented to person, place, and time. Decreased LT distally.  Skin: wounds dressed. Not visulaized  Psychiatric: pleasant    Assessment & Plan: 1. Left BKA/ Phantom Pain: -Oxycodone 15 mg 1 every 6 hours as needed #120 RF. We will continue the controlled substance monitoring program, this consists of regular clinic visits, examinations, routine drug screening, pill counts as well as use of [place] Controlled Substance Reporting System. [place] today.   Medication was refilled and a second prescription was sent to the patient's pharmacy for next month.   . 2. 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 Cymbalta, topamax 3. Hx of spinal stimulator  4. COPD: On Continuous Oxygen Therapy @ 3 liters Nasal Cannula per Pulmonology---no changes  5. Reactive depression: cymbalta  30mg   6.Hx of kidney stones 7. Pressure sores: improving per pt  10 minutes of face to face patient care time was spent during this visit. All questions were encouraged and answered. Follow up with NP in 2 months.

## 2018-10-14 NOTE — Patient Instructions (Signed)
PLEASE FEEL FREE TO CALL OUR OFFICE WITH ANY PROBLEMS OR QUESTIONS ([phone])     STOP THE TIZANIDINE IF YOU DON'T FEEL IT'S HELPING YOU

## 2018-10-29 ENCOUNTER — Other Ambulatory Visit: Payer: Self-pay | Admitting: Physical Medicine & Rehabilitation

## 2018-12-14 ENCOUNTER — Encounter: Payer: Medicare Other | Admitting: Registered Nurse

## 2018-12-18 ENCOUNTER — Encounter: Payer: Self-pay | Admitting: Registered Nurse

## 2018-12-18 ENCOUNTER — Encounter: Payer: Medicare Other | Attending: Physical Medicine & Rehabilitation | Admitting: Registered Nurse

## 2018-12-18 ENCOUNTER — Other Ambulatory Visit: Payer: Self-pay

## 2018-12-18 VITALS — BP 153/73 | HR 91 | Temp 98.7°F | Ht 70.0 in

## 2018-12-18 DIAGNOSIS — Z5181 Encounter for therapeutic drug level monitoring: Secondary | ICD-10-CM | POA: Diagnosis present

## 2018-12-18 DIAGNOSIS — E1151 Type 2 diabetes mellitus with diabetic peripheral angiopathy without gangrene: Secondary | ICD-10-CM | POA: Insufficient documentation

## 2018-12-18 DIAGNOSIS — G894 Chronic pain syndrome: Secondary | ICD-10-CM | POA: Diagnosis present

## 2018-12-18 DIAGNOSIS — G546 Phantom limb syndrome with pain: Secondary | ICD-10-CM | POA: Diagnosis not present

## 2018-12-18 DIAGNOSIS — R5381 Other malaise: Secondary | ICD-10-CM | POA: Diagnosis present

## 2018-12-18 DIAGNOSIS — S88119S Complete traumatic amputation at level between knee and ankle, unspecified lower leg, sequela: Secondary | ICD-10-CM

## 2018-12-18 DIAGNOSIS — Z79899 Other long term (current) drug therapy: Secondary | ICD-10-CM | POA: Diagnosis present

## 2018-12-18 DIAGNOSIS — S88112S Complete traumatic amputation at level between knee and ankle, left lower leg, sequela: Secondary | ICD-10-CM | POA: Insufficient documentation

## 2018-12-18 MED ORDER — OXYCODONE HCL 15 MG PO TABS: 15.0000 mg | ORAL_TABLET | Freq: Every day | ORAL | 0 refills | Status: DC | PRN

## 2018-12-18 NOTE — Progress Notes (Signed)
Subjective:    Patient ID: Jeremy [name], male    DOB: 07-Nov-1950, 68 y.o.   MRN: [id number]  HPI: Jeremy [name] is a 68 y.o. male who returns for follow up appointment for chronic pain and medication refill. He states his pain is located in his right groin and phantom pain. He rates his pain 3. His current exercise regime is walking and performing stretching exercises.  [name] Morphine equivalent is 112.50 MME.  Oral Swab Performed today.   Pain Inventory Average Pain 6 Pain Right Now 3 My pain is intermittent, sharp, stabbing and aching  In the last 24 hours, has pain interfered with the following? General activity 5 Relation with others 3 Enjoyment of life 6 What TIME of day is your pain at its worst? morning and night  Sleep (in general) Fair  Pain is worse with: inactivity Pain improves with: medication Relief from Meds: 2  Mobility use a walker how many minutes can you walk? 1-2 ability to climb steps?  no do you drive?  yes use a wheelchair needs help with transfers Do you have any goals in this area?  no  Function retired Do you have any goals in this area?  no  Neuro/Psych No problems in this area  Prior Studies Any changes since last visit?  no  Physicians involved in your care Any changes since last visit?  no   Family History  Problem Relation Age of Onset  . Heart disease Brother    Social History   Socioeconomic History  . Marital status: Married    Spouse name: Not on file  . Number of children: Not on file  . Years of education: Not on file  . Highest education level: Not on file  Occupational History  . Not on file  Social Needs  . Financial resource strain: Not on file  . Food insecurity    Worry: Not on file    Inability: Not on file  . Transportation needs    Medical: Not on file    Non-medical: Not on file  Tobacco Use  . Smoking status: Former Smoker    Packs/day: 0.50    Years: 6.00    Pack years: 3.00    Types:  Cigarettes    Quit date: 12/03/1990    Years since quitting: 28.0  . Smokeless tobacco: Never Used  Substance and Sexual Activity  . Alcohol use: Yes    Alcohol/week: 0.0 standard drinks    Comment: rare  . Drug use: Yes  . Sexual activity: Not Currently  Lifestyle  . Physical activity    Days per week: Not on file    Minutes per session: Not on file  . Stress: Not on file  Relationships  . Social Herbalist on phone: Not on file    Gets together: Not on file    Attends religious service: Not on file    Active member of club or organization: Not on file    Attends meetings of clubs or organizations: Not on file    Relationship status: Not on file  Other Topics Concern  . Not on file  Social History Narrative  . Not on file   Past Surgical History:  Procedure Laterality Date  . AMPUTATION Left 11/25/2012   Procedure: AMPUTATION BELOW KNEE;  Surgeon: [name], MD;  Location: [place];  Service: Orthopedics;  Laterality: Left;  Left Below Knee Amputation  . APLIGRAFT PLACEMENT Left 07/31/2012  Procedure: Apply Skin Graft;  Surgeon: [name], MD;  Location: [place];  Service: Orthopedics;  Laterality: Left;  . APPLICATION OF WOUND VAC Left 07/31/2012   Procedure: APPLICATION OF WOUND VAC;  Surgeon: [name], MD;  Location: [place];  Service: Orthopedics;  Laterality: Left;  . arm surgery    . back stimulator    . CARDIAC CATHETERIZATION     Hx: of " around 1985 at [facility]"  . CYST EXCISION     Hx: of  . I&D EXTREMITY  03/13/2011   Procedure: IRRIGATION AND DEBRIDEMENT EXTREMITY;  Surgeon: [name], MD;  Location: [place];  Service: Orthopedics;  Laterality: Left;  . I&D EXTREMITY Left 11/06/2012   Procedure: IRRIGATION AND DEBRIDEMENT EXTREMITY-Left lower ;  Surgeon: [name], MD;  Location: [place];  Service: Orthopedics;  Laterality: Left;  Left Leg Irrigation and Debridement, Place Skin Graft, Wound VAC, Theraskin  . skin grafts    . SKIN SPLIT GRAFT   03/13/2012   Procedure: SKIN GRAFT SPLIT THICKNESS;  Surgeon: [name], MD;  Location: [place];  Service: Orthopedics;  Laterality: Left;  Excisional Debridement Left Leg, Split Thickness Skin Graft, Wound VAC  . stomach stapling    . tummy tuck    . VEIN LIGATION AND STRIPPING     Past Medical History:  Diagnosis Date  . Anemia   . Asthma   . Cancer ([place])    hx basal cell skin cancer  . CHF (congestive heart failure) ([place])    does not see a cardiologist  . COPD (chronic obstructive pulmonary disease) ([place])    3L continuous  . Diabetes mellitus    not on medication  . DVT (deep venous thrombosis) ([facility])    LLE DVT '80's  . GERD (gastroesophageal reflux disease)   . Headache(784.0)    sinus headaches  . Leg pain   . Peripheral neuropathy   . Peripheral vascular disease ([place])   . Psoriasis   . Shortness of breath   . Sleep apnea    uses bipap, sleep study done in [place] 2 years ago, dr [name]   BP (!) 153/73   Pulse 91   Temp 98.7 F (37.1 C) (Oral)   Ht 5\' 10"  (1.778 m)   SpO2 97% Comment: 3L of o2  BMI 45.92 kg/m   Opioid Risk Score:   Fall Risk Score:  `1  Depression screen PHQ 2/9  Depression screen [facility] 2/9 08/20/2018 03/02/2018 05/21/2017 04/18/2017 12/30/2016 11/04/2016 01/04/2016  Decreased Interest 0 0 0 0 0 0 0  Down, Depressed, Hopeless 0 0 0 0 0 0 0  PHQ - 2 Score 0 0 0 0 0 0 0  Altered sleeping - - - - - - -  Tired, decreased energy - - - - - - -  Change in appetite - - - - - - -  Feeling bad or failure about yourself  - - - - - - -  Trouble concentrating - - - - - - -  Moving slowly or fidgety/restless - - - - - - -  Suicidal thoughts - - - - - - -  PHQ-9 Score - - - - - - -  Difficult doing work/chores - - - - - - -    Review of Systems  Constitutional: Negative.   HENT: Negative.   Eyes: Negative.   Respiratory: Negative.   Cardiovascular: Negative.   Gastrointestinal: Negative.   Endocrine: Negative.  Genitourinary: Negative.    Musculoskeletal: Negative.   Skin: Negative.   Allergic/Immunologic: Negative.   Neurological: Negative.   Hematological: Negative.   Psychiatric/Behavioral: Negative.   All other systems reviewed and are negative.      Objective:   Physical Exam Vitals signs and nursing note reviewed.  Constitutional:      Appearance: Normal appearance.  Neck:     Musculoskeletal: Normal range of motion and neck supple.  Cardiovascular:     Rate and Rhythm: Normal rate and regular rhythm.     Pulses: Normal pulses.     Heart sounds: Normal heart sounds.  Pulmonary:     Effort: Pulmonary effort is normal.     Breath sounds: Normal breath sounds.  Musculoskeletal:     Comments: Normal Muscle Bulk and Muscle Testing Reveals:  Upper Extremities: Full ROM and Muscle Strength 5/5  Lower Extremities: Right: Full ROM and Muscle Strength 5/5 Left: BKA Arrived in wheelchair    Skin:    General: Skin is warm and dry.  Neurological:     Mental Status: He is alert and oriented to person, place, and time.  Psychiatric:        Mood and Affect: Mood normal.        Behavior: Behavior normal.           Assessment & Plan:  1.Left BKA/ [place] Pain :Continue current medication regimen: Continueoxycodone 15mg  one tablet5 times a day as needed # 150. Second script sent for the following month. 12/18/2018 We will continue the opioid monitoring program, this consists of regular clinic visits, examinations, urine drug screen, pill counts as well as use of [place] Controlled Substance Reporting System. Continue: Topamax50 mg one tablet at HS and Amitriptyline..  2. Chronic Bilateral Low Back Pain without Sciatica/ : No complaints Today. 12/18/2018. 3.Chronic pain syndrome related to his chronic left leg pain: Continue Current medication Regime. 12/18/2018. 4. Hx of spinal stimulator. Continue to Monitor. 12/18/2018. 5. COPD: On Continuous Oxygen Therapy @ 3 liters Nasal Cannula. Pulmonology  Following. 12/18/2018 6. Depression: Continue: Cymbalta. 12/18/2018 7.. Sacral Decubitus Stage 3: Wound CenterFollowing ( OVAH).Continue adequate nutrition and weight shifting08/28/2020.  15 minutes of face to face patient care time was spent during this visit. All questions were encouraged and answered.  F/U in 2 months

## 2018-12-22 ENCOUNTER — Telehealth: Payer: Self-pay | Admitting: *Deleted

## 2018-12-22 LAB — DRUG TOX MONITOR 1 W/CONF, ORAL FLD
Amphetamines: NEGATIVE ng/mL (ref <10)
Barbiturates: NEGATIVE ng/mL (ref <10)
Benzodiazepines: NEGATIVE ng/mL (ref <0.50)
Buprenorphine: NEGATIVE ng/mL (ref <0.10)
Cocaine: NEGATIVE ng/mL (ref <5.0)
Codeine: NEGATIVE ng/mL (ref <2.5)
Dihydrocodeine: NEGATIVE ng/mL (ref <2.5)
Fentanyl: NEGATIVE ng/mL (ref <0.10)
Heroin Metabolite: NEGATIVE ng/mL (ref <1.0)
Hydrocodone: NEGATIVE ng/mL (ref <2.5)
Hydromorphone: NEGATIVE ng/mL (ref <2.5)
MARIJUANA: NEGATIVE ng/mL (ref <2.5)
MDMA: NEGATIVE ng/mL (ref <10)
Meprobamate: NEGATIVE ng/mL (ref <2.5)
Methadone: NEGATIVE ng/mL (ref <5.0)
Morphine: NEGATIVE ng/mL (ref <2.5)
Nicotine Metabolite: NEGATIVE ng/mL (ref <5.0)
Norhydrocodone: NEGATIVE ng/mL (ref <2.5)
Noroxycodone: 53.6 ng/mL — ABNORMAL HIGH (ref <2.5)
Opiates: POSITIVE ng/mL — AB (ref <2.5)
Oxycodone: 150.3 ng/mL — ABNORMAL HIGH (ref <2.5)
Oxymorphone: NEGATIVE ng/mL (ref <2.5)
Phencyclidine: NEGATIVE ng/mL (ref <10)
Tapentadol: NEGATIVE ng/mL (ref <5.0)
Tramadol: NEGATIVE ng/mL (ref <5.0)
Zolpidem: NEGATIVE ng/mL (ref <5.0)

## 2018-12-22 LAB — DRUG TOX ALC METAB W/CON, ORAL FLD: Alcohol Metabolite: NEGATIVE ng/mL (ref <25)

## 2018-12-22 NOTE — Telephone Encounter (Signed)
Oral swab drug screen was consistent for prescribed medications.  ?

## 2019-01-25 ENCOUNTER — Other Ambulatory Visit: Payer: Self-pay | Admitting: Physical Medicine & Rehabilitation

## 2019-02-17 ENCOUNTER — Encounter: Payer: Medicare Other | Attending: Physical Medicine & Rehabilitation | Admitting: Registered Nurse

## 2019-02-17 ENCOUNTER — Other Ambulatory Visit: Payer: Self-pay

## 2019-02-17 ENCOUNTER — Encounter: Payer: Self-pay | Admitting: Registered Nurse

## 2019-02-17 DIAGNOSIS — E1151 Type 2 diabetes mellitus with diabetic peripheral angiopathy without gangrene: Secondary | ICD-10-CM | POA: Insufficient documentation

## 2019-02-17 DIAGNOSIS — Z79899 Other long term (current) drug therapy: Secondary | ICD-10-CM

## 2019-02-17 DIAGNOSIS — G546 Phantom limb syndrome with pain: Secondary | ICD-10-CM | POA: Diagnosis not present

## 2019-02-17 DIAGNOSIS — G8929 Other chronic pain: Secondary | ICD-10-CM

## 2019-02-17 DIAGNOSIS — G894 Chronic pain syndrome: Secondary | ICD-10-CM

## 2019-02-17 DIAGNOSIS — S88112S Complete traumatic amputation at level between knee and ankle, left lower leg, sequela: Secondary | ICD-10-CM | POA: Insufficient documentation

## 2019-02-17 DIAGNOSIS — Z5181 Encounter for therapeutic drug level monitoring: Secondary | ICD-10-CM

## 2019-02-17 DIAGNOSIS — S88119S Complete traumatic amputation at level between knee and ankle, unspecified lower leg, sequela: Secondary | ICD-10-CM

## 2019-02-17 DIAGNOSIS — L89153 Pressure ulcer of sacral region, stage 3: Secondary | ICD-10-CM

## 2019-02-17 DIAGNOSIS — M545 Low back pain, unspecified: Secondary | ICD-10-CM

## 2019-02-17 DIAGNOSIS — R5381 Other malaise: Secondary | ICD-10-CM | POA: Insufficient documentation

## 2019-02-17 MED ORDER — OXYCODONE HCL 15 MG PO TABS: 15.0000 mg | ORAL_TABLET | Freq: Every day | ORAL | 0 refills | Status: DC | PRN

## 2019-02-17 NOTE — Progress Notes (Signed)
Subjective:    Patient ID: Jeremy [name], male    DOB: [birth year], 68 y.o.   MRN: [id number]  HPI: Jeremy [name] is a 68 y.o. male whose appointment was changed to a tele-health visit, Jeremy [name] called office this morning stating he was having nausea and vomiting. His appointment was changed to a telephone visit, he agreed to tele-health visit.  He states his pain is located in his lower back and buttocks ( sacral decubitus), he was instructed to have the wound center send over their note, he verbalizes understanding. Jeremy [name] also reports his pain has increased in intensity in his buttocks and he's not receiving relief in his pain with current regimen, we discussed long acting medication, he verbalizes understanding. This month we will continue current medication regimen, and will review his drug formulary and discussed other options, he verbalizes understanding. He rates his pain 6. His current exercise regime is performing stretching exercises.  [name] Morphine equivalent is 112.50  MME.  Last Oral Swab was Performed on 12/18/2018, it was consistent.   Health and History Questions were asked by Jeremy [name] CMA. This provider and Jeremy [name] verified we were speaking with the correct person using two identifiers.    Pain Inventory Average Pain 6 Pain Right Now 6 My pain is intermittent, sharp, stabbing and aching  In the last 24 hours, has pain interfered with the following? General activity 7 Relation with others 4 Enjoyment of life 9 What TIME of day is your pain at its worst? always Sleep (in general) Poor  Pain is worse with: sitting Pain improves with: medication Relief from Meds: 4  Mobility walk with assistance use a walker use a wheelchair  Function retired  Neuro/Psych tingling trouble walking spasms  Prior Studies Any changes since last visit?  no  Physicians involved in your care Any changes since last visit?  no   Family History  Problem  Relation Age of Onset  . Heart disease Brother    Social History   Socioeconomic History  . Marital status: Married    Spouse name: Not on file  . Number of children: Not on file  . Years of education: Not on file  . Highest education level: Not on file  Occupational History  . Not on file  Social Needs  . Financial resource strain: Not on file  . Food insecurity    Worry: Not on file    Inability: Not on file  . Transportation needs    Medical: Not on file    Non-medical: Not on file  Tobacco Use  . Smoking status: Former Smoker    Packs/day: 0.50    Years: 6.00    Pack years: 3.00    Types: Cigarettes    Quit date: 12/03/1990    Years since quitting: 28.2  . Smokeless tobacco: Never Used  Substance and Sexual Activity  . Alcohol use: Yes    Alcohol/week: 0.0 standard drinks    Comment: rare  . Drug use: Yes  . Sexual activity: Not Currently  Lifestyle  . Physical activity    Days per week: Not on file    Minutes per session: Not on file  . Stress: Not on file  Relationships  . Social Herbalist on phone: Not on file    Gets together: Not on file    Attends religious service: Not on file    Active member of club or organization: Not on file  Attends meetings of clubs or organizations: Not on file    Relationship status: Not on file  Other Topics Concern  . Not on file  Social History Narrative  . Not on file   Past Surgical History:  Procedure Laterality Date  . AMPUTATION Left 11/25/2012   Procedure: AMPUTATION BELOW KNEE;  Surgeon: Jeremy Minion, MD;  Location: [place];  Service: Orthopedics;  Laterality: Left;  Left Below Knee Amputation  . APLIGRAFT PLACEMENT Left 07/31/2012   Procedure: Apply Skin Graft;  Surgeon: Jeremy Minion, MD;  Location: [place];  Service: Orthopedics;  Laterality: Left;  . APPLICATION OF WOUND VAC Left 07/31/2012   Procedure: APPLICATION OF WOUND VAC;  Surgeon: Jeremy Minion, MD;  Location: [place];  Service: Orthopedics;   Laterality: Left;  . arm surgery    . back stimulator    . CARDIAC CATHETERIZATION     Hx: of " around 1985 at [facility]"  . CYST EXCISION     Hx: of  . I&D EXTREMITY  03/13/2011   Procedure: IRRIGATION AND DEBRIDEMENT EXTREMITY;  Surgeon: Jeremy Minion, MD;  Location: [place];  Service: Orthopedics;  Laterality: Left;  . I&D EXTREMITY Left 11/06/2012   Procedure: IRRIGATION AND DEBRIDEMENT EXTREMITY-Left lower ;  Surgeon: Jeremy Minion, MD;  Location: [place];  Service: Orthopedics;  Laterality: Left;  Left Leg Irrigation and Debridement, Place Skin Graft, Wound VAC, Theraskin  . skin grafts    . SKIN SPLIT GRAFT  03/13/2012   Procedure: SKIN GRAFT SPLIT THICKNESS;  Surgeon: Jeremy Minion, MD;  Location: [place];  Service: Orthopedics;  Laterality: Left;  Excisional Debridement Left Leg, Split Thickness Skin Graft, Wound VAC  . stomach stapling    . tummy tuck    . VEIN LIGATION AND STRIPPING     Past Medical History:  Diagnosis Date  . Anemia   . Asthma   . Cancer ([place])    hx basal cell skin cancer  . CHF (congestive heart failure) ([place])    does not see a cardiologist  . COPD (chronic obstructive pulmonary disease) ([place])    3L continuous  . Diabetes mellitus    not on medication  . DVT (deep venous thrombosis) ([facility])    LLE DVT '80's  . GERD (gastroesophageal reflux disease)   . Headache(784.0)    sinus headaches  . Leg pain   . Peripheral neuropathy   . Peripheral vascular disease ([place])   . Psoriasis   . Shortness of breath   . Sleep apnea    uses bipap, sleep study done in [place] 2 years ago, dr Jeremy [name]   There were no vitals taken for this visit.  Opioid Risk Score:   Fall Risk Score:  `1  Depression screen PHQ 2/9  Depression screen [facility] 2/9 08/20/2018 03/02/2018 05/21/2017 04/18/2017 12/30/2016 11/04/2016 01/04/2016  Decreased Interest 0 0 0 0 0 0 0  Down, Depressed, Hopeless 0 0 0 0 0 0 0  PHQ - 2 Score 0 0 0 0 0 0 0  Altered sleeping - - - - - - -  Tired, decreased  energy - - - - - - -  Change in appetite - - - - - - -  Feeling bad or failure about yourself  - - - - - - -  Trouble concentrating - - - - - - -  Moving slowly or fidgety/restless - - - - - - -  Suicidal thoughts - - - - - - -  PHQ-9 Score - - - - - - -  Difficult doing work/chores - - - - - - -    Review of Systems  Constitutional: Negative.   HENT: Negative.   Eyes: Negative.   Respiratory: Negative.   Cardiovascular: Negative.   Gastrointestinal: Positive for rectal pain.  Endocrine: Negative.   Genitourinary: Negative.   Musculoskeletal: Positive for gait problem.  Skin: Negative.   Allergic/Immunologic: Negative.   Psychiatric/Behavioral: Negative.   All other systems reviewed and are negative.      Objective:   Physical Exam Vitals signs and nursing note reviewed.  Neurological:     Mental Status: He is oriented to person, place, and time.           Assessment & Plan:  1.Left BKA/ [place] Pain :Continue current medication regimen: Continueoxycodone 15mg  one tablet5 times a day as needed # 150. We will review his drug formulary for long acting medication, he verbalizes understanding.02/17/2019 We will continue the opioid monitoring program, this consists of regular clinic visits, examinations, urine drug screen, pill counts as well as use of [place] Controlled Substance Reporting System.Continue: Topamax50 mg one tablet at HS and Amitriptyline..  2. Chronic Bilateral Low Back Pain without Sciatica/ : No complaints Today. 02/17/2019. 3.Chronic pain syndrome related to his chronic left leg pain: Continue Current medication Regime. 02/17/2019. 4. Hx of spinal stimulator. Continue to Monitor. 02/17/2019. 5. COPD: On Continuous Oxygen Therapy @ 3 liters Nasal Cannula. Pulmonology Following. 02/17/2019 6. Depression: Continue: Cymbalta. 02/17/2019 7.. Sacral Decubitus Stage 3: Wound CenterFollowing ( OVAH).Continue adequate nutrition and weight  shifting10/28/2020.  15 minutes of face to face patient care time was spent during this visit. All questions were encouraged and answered.  F/U in 2 months

## 2019-02-22 ENCOUNTER — Other Ambulatory Visit: Payer: Self-pay | Admitting: Physical Medicine & Rehabilitation

## 2019-02-22 DIAGNOSIS — G894 Chronic pain syndrome: Secondary | ICD-10-CM

## 2019-02-22 DIAGNOSIS — S88112S Complete traumatic amputation at level between knee and ankle, left lower leg, sequela: Secondary | ICD-10-CM

## 2019-03-10 ENCOUNTER — Telehealth: Payer: Self-pay | Admitting: Registered Nurse

## 2019-03-10 DIAGNOSIS — S88119S Complete traumatic amputation at level between knee and ankle, unspecified lower leg, sequela: Secondary | ICD-10-CM

## 2019-03-10 DIAGNOSIS — G894 Chronic pain syndrome: Secondary | ICD-10-CM

## 2019-03-10 MED ORDER — OXYCODONE HCL 10 MG PO TABS: 10.0000 mg | ORAL_TABLET | Freq: Four times a day (QID) | ORAL | 0 refills | Status: DC | PRN

## 2019-03-10 MED ORDER — OXYCODONE HCL ER 15 MG PO T12A: 15.0000 mg | EXTENDED_RELEASE_TABLET | Freq: Two times a day (BID) | ORAL | 0 refills | Status: DC

## 2019-03-10 NOTE — Telephone Encounter (Signed)
Jeremy [name] reports he's not receiving relief of his pain with his current medication regimen. We discussed other treatment modalities. We will prescribe Oxycontin  ER 15 mg every q 12 hours and  Oxycodone 10 mg every 6 hours as needed for pain. His current MME is 112.50. The above medication will change his MME to 105.00. Medications E-scribe, office staff will submit a PA today.

## 2019-03-29 MED ORDER — OXYCODONE HCL 10 MG PO TABS: 10.0000 mg | ORAL_TABLET | Freq: Every day | ORAL | 0 refills | Status: DC | PRN

## 2019-03-29 NOTE — Telephone Encounter (Signed)
Message from patient.  Jeremy [name] is on vacation for the week.

## 2019-03-29 NOTE — Telephone Encounter (Signed)
Changed oxycodone to 5times daily as needed. No rx with DNF of 04/11/2019 written

## 2019-04-19 ENCOUNTER — Encounter: Payer: Medicare Other | Attending: Physical Medicine & Rehabilitation | Admitting: Registered Nurse

## 2019-04-19 ENCOUNTER — Other Ambulatory Visit: Payer: Self-pay

## 2019-04-19 ENCOUNTER — Encounter: Payer: Self-pay | Admitting: Registered Nurse

## 2019-04-19 VITALS — Temp 97.8°F | Ht 70.0 in | Wt 325.0 lb

## 2019-04-19 DIAGNOSIS — R5381 Other malaise: Secondary | ICD-10-CM | POA: Insufficient documentation

## 2019-04-19 DIAGNOSIS — Z79899 Other long term (current) drug therapy: Secondary | ICD-10-CM | POA: Insufficient documentation

## 2019-04-19 DIAGNOSIS — F329 Major depressive disorder, single episode, unspecified: Secondary | ICD-10-CM

## 2019-04-19 DIAGNOSIS — Z89512 Acquired absence of left leg below knee: Secondary | ICD-10-CM

## 2019-04-19 DIAGNOSIS — L89153 Pressure ulcer of sacral region, stage 3: Secondary | ICD-10-CM

## 2019-04-19 DIAGNOSIS — Z9981 Dependence on supplemental oxygen: Secondary | ICD-10-CM

## 2019-04-19 DIAGNOSIS — S88119S Complete traumatic amputation at level between knee and ankle, unspecified lower leg, sequela: Secondary | ICD-10-CM

## 2019-04-19 DIAGNOSIS — M545 Low back pain, unspecified: Secondary | ICD-10-CM

## 2019-04-19 DIAGNOSIS — G546 Phantom limb syndrome with pain: Secondary | ICD-10-CM | POA: Diagnosis not present

## 2019-04-19 DIAGNOSIS — G894 Chronic pain syndrome: Secondary | ICD-10-CM | POA: Insufficient documentation

## 2019-04-19 DIAGNOSIS — Z87891 Personal history of nicotine dependence: Secondary | ICD-10-CM | POA: Diagnosis not present

## 2019-04-19 DIAGNOSIS — Z79891 Long term (current) use of opiate analgesic: Secondary | ICD-10-CM

## 2019-04-19 DIAGNOSIS — Z9682 Presence of neurostimulator: Secondary | ICD-10-CM

## 2019-04-19 DIAGNOSIS — G8929 Other chronic pain: Secondary | ICD-10-CM

## 2019-04-19 DIAGNOSIS — J449 Chronic obstructive pulmonary disease, unspecified: Secondary | ICD-10-CM | POA: Diagnosis not present

## 2019-04-19 DIAGNOSIS — S88112S Complete traumatic amputation at level between knee and ankle, left lower leg, sequela: Secondary | ICD-10-CM | POA: Insufficient documentation

## 2019-04-19 DIAGNOSIS — E1151 Type 2 diabetes mellitus with diabetic peripheral angiopathy without gangrene: Secondary | ICD-10-CM | POA: Insufficient documentation

## 2019-04-19 DIAGNOSIS — Z5181 Encounter for therapeutic drug level monitoring: Secondary | ICD-10-CM

## 2019-04-19 MED ORDER — OXYCODONE HCL ER 15 MG PO T12A: 15.0000 mg | EXTENDED_RELEASE_TABLET | Freq: Two times a day (BID) | ORAL | 0 refills | Status: DC

## 2019-04-19 MED ORDER — OXYCODONE HCL 10 MG PO TABS: 10.0000 mg | ORAL_TABLET | Freq: Every day | ORAL | 0 refills | Status: DC | PRN

## 2019-04-19 NOTE — Progress Notes (Signed)
Subjective:    Patient ID: Jeremy [name], male    DOB: [birth year], 68 y.o.   MRN: [id number]  HPI: Jeremy [name] is a 68 y.o. male whose appointment was changed to a virtual office visit to reduce the risk of exposure to the COVID-19 virus and to help Jeremy [name] remain healthy and safe. The virtual visit will also provide continuity of care. Jeremy [name] agrees with virtual visit and verbalizes understanding. He states his pain is located in his lower back and sacral decubitus pain. He rates his pain 6. His current exercise regime is performing stretching exercises.  Jeremy [name] Morphine equivalent is 120.00 MME.  Last Oral Swab was Performed on 12/18/2018, it was consistent.   Marland Mcalpine CMA asked the Health and History Questions, this provider and Marland Mcalpine verified we were speaking with the correct person using two identifiers.    Pain Inventory Average Pain 6 Pain Right Now 6 My pain is aching  In the last 24 hours, has pain interfered with the following? General activity 0 Relation with others 0 Enjoyment of life 0 What TIME of day is your pain at its worst? morning Sleep (in general) Poor  Pain is worse with: some activites Pain improves with: medication Relief from Meds: 6  Mobility use a wheelchair  Function retired  Neuro/Psych No problems in this area  Prior Studies Any changes since last visit?  no  Physicians involved in your care    Family History  Problem Relation Age of Onset  . Heart disease Brother    Social History   Socioeconomic History  . Marital status: Married    Spouse name: Not on file  . Number of children: Not on file  . Years of education: Not on file  . Highest education level: Not on file  Occupational History  . Not on file  Tobacco Use  . Smoking status: Former Smoker    Packs/day: 0.50    Years: 6.00    Pack years: 3.00    Types: Cigarettes    Quit date: 12/03/1990    Years since quitting: 28.3  . Smokeless tobacco: Never  Used  Substance and Sexual Activity  . Alcohol use: Yes    Alcohol/week: 0.0 standard drinks    Comment: rare  . Drug use: Yes  . Sexual activity: Not Currently  Other Topics Concern  . Not on file  Social History Narrative  . Not on file   Social Determinants of Health   Financial Resource Strain:   . Difficulty of Paying Living Expenses: Not on file  Food Insecurity:   . Worried About Charity fundraiser in the Last Year: Not on file  . Ran Out of Food in the Last Year: Not on file  Transportation Needs:   . Lack of Transportation (Medical): Not on file  . Lack of Transportation (Non-Medical): Not on file  Physical Activity:   . Days of Exercise per Week: Not on file  . Minutes of Exercise per Session: Not on file  Stress:   . Feeling of Stress : Not on file  Social Connections:   . Frequency of Communication with Friends and Family: Not on file  . Frequency of Social Gatherings with Friends and Family: Not on file  . Attends Religious Services: Not on file  . Active Member of Clubs or Organizations: Not on file  . Attends Archivist Meetings: Not on file  . Marital Status: Not on file  Past Surgical History:  Procedure Laterality Date  . AMPUTATION Left 11/25/2012   Procedure: AMPUTATION BELOW KNEE;  Surgeon: [name], MD;  Location: [facility];  Service: Orthopedics;  Laterality: Left;  Left Below Knee Amputation  . APLIGRAFT PLACEMENT Left 07/31/2012   Procedure: Apply Skin Graft;  Surgeon: [name], MD;  Location: [facility];  Service: Orthopedics;  Laterality: Left;  . APPLICATION OF WOUND VAC Left 07/31/2012   Procedure: APPLICATION OF WOUND VAC;  Surgeon: [name], MD;  Location: [facility];  Service: Orthopedics;  Laterality: Left;  . arm surgery    . back stimulator    . CARDIAC CATHETERIZATION     Hx: of " around 1985 at [facility]"  . CYST EXCISION     Hx: of  . I & D EXTREMITY  03/13/2011   Procedure: IRRIGATION AND DEBRIDEMENT EXTREMITY;  Surgeon:  [name], MD;  Location: [facility];  Service: Orthopedics;  Laterality: Left;  . I & D EXTREMITY Left 11/06/2012   Procedure: IRRIGATION AND DEBRIDEMENT EXTREMITY-Left lower ;  Surgeon: [name], MD;  Location: [facility];  Service: Orthopedics;  Laterality: Left;  Left Leg Irrigation and Debridement, Place Skin Graft, Wound VAC, [place]  . skin grafts    . SKIN SPLIT GRAFT  03/13/2012   Procedure: SKIN GRAFT SPLIT THICKNESS;  Surgeon: [name], MD;  Location: [facility];  Service: Orthopedics;  Laterality: Left;  Excisional Debridement Left Leg, Split Thickness Skin Graft, Wound VAC  . stomach stapling    . tummy tuck    . VEIN LIGATION AND STRIPPING     Past Medical History:  Diagnosis Date  . Anemia   . Asthma   . Cancer (Uncertain)    hx basal cell skin cancer  . CHF (congestive heart failure) ([place])    does not see a cardiologist  . COPD (chronic obstructive pulmonary disease) ([place])    3L continuous  . Diabetes mellitus    not on medication  . DVT (deep venous thrombosis) ([facility])    LLE DVT '80's  . GERD (gastroesophageal reflux disease)   . Headache(784.0)    sinus headaches  . Leg pain   . Peripheral neuropathy   . Peripheral vascular disease ([place])   . Psoriasis   . Shortness of breath   . Sleep apnea    uses bipap, sleep study done in [place] 2 years ago, dr [name]   There were no vitals taken for this visit.  Opioid Risk Score:   Fall Risk Score:  `1  Depression screen PHQ 2/9  Depression screen [facility] 2/9 08/20/2018 03/02/2018 05/21/2017 04/18/2017 12/30/2016 11/04/2016 01/04/2016  Decreased Interest 0 0 0 0 0 0 0  Down, Depressed, Hopeless 0 0 0 0 0 0 0  PHQ - 2 Score 0 0 0 0 0 0 0  Altered sleeping - - - - - - -  Tired, decreased energy - - - - - - -  Change in appetite - - - - - - -  Feeling bad or failure about yourself  - - - - - - -  Trouble concentrating - - - - - - -  Moving slowly or fidgety/restless - - - - - - -  Suicidal thoughts - - - - - - -   PHQ-9 Score - - - - - - -  Difficult doing work/chores - - - - - - -     Review of Systems  Constitutional: Negative.   HENT: Negative.   Eyes: Negative.   Respiratory: Negative.   Cardiovascular: Negative.   Gastrointestinal: Negative.   Endocrine: Negative.   Genitourinary: Negative.   Musculoskeletal: Positive for arthralgias and gait problem.  Skin: Negative.   Allergic/Immunologic: Negative.   Hematological: Negative.   Psychiatric/Behavioral: Negative.   All other systems reviewed and are negative.      Objective:   Physical Exam Vitals and nursing note reviewed.  Musculoskeletal:     Comments: No Physical Exam: Virtual Visit           Assessment & Plan:  1.Left BKA/ [place] Pain :Continue current medication regimen: Continueoxycodone 15mg  one tablet5 times a day as needed # 150.We will review his drug formulary for long acting medication, he verbalizes understanding.04/19/2019 We will continue the opioid monitoring program, this consists of regular clinic visits, examinations, urine drug screen, pill counts as well as use of [place] Controlled Substance Reporting System.Continue: Topamax50 mg one tablet at HS and Amitriptyline..  2. Chronic Bilateral Low Back Pain without Sciatica/: No complaints Today. 04/19/2019. 3.Chronic pain syndrome related to his chronic left leg pain: Continue Current medication Regime. 04/19/2019. 4. Hx of spinal stimulator. Continue to Monitor. 04/19/2019. 5. COPD: On Continuous Oxygen Therapy @ 3 liters Nasal Cannula. Pulmonology Following. 04/19/2019 6. Depression: Continue: Cymbalta. 04/19/2019 7.. Sacral Decubitus Stage 3: Wound CenterFollowing ( [facility]).Continue adequate nutrition and weight shifting12/28/2020.   F/U in 2 months  Telephone Call Established Patient Place of Patient: His Home Place of Provider: Office Time Spent: 15 Minutes

## 2019-05-24 MED ORDER — OXYCODONE HCL ER 15 MG PO T12A: 15.0000 mg | EXTENDED_RELEASE_TABLET | Freq: Two times a day (BID) | ORAL | 0 refills | Status: DC

## 2019-05-24 MED ORDER — OXYCODONE HCL 10 MG PO TABS: 10.0000 mg | ORAL_TABLET | Freq: Every day | ORAL | 0 refills | Status: DC | PRN

## 2019-06-09 ENCOUNTER — Telehealth: Payer: Self-pay | Admitting: Registered Nurse

## 2019-06-09 NOTE — Telephone Encounter (Signed)
Request from patient 

## 2019-06-09 NOTE — Telephone Encounter (Signed)
[name] sent a My-chart message regarding his oxycodone, he is concerned about the storm on 06/10/2019. PMP was reviewed Oxycodone was filled on 05/13/2019, we will give permission for him to fill his Oxycodone today. Placed a call and spoke to the pharmacist regarding the above, she verbalizes understanding. Placed a call to [name] regarding the above and he verbalizes understanding.

## 2019-06-16 ENCOUNTER — Encounter: Payer: Medicare Other | Attending: Physical Medicine & Rehabilitation | Admitting: Physical Medicine & Rehabilitation

## 2019-06-16 ENCOUNTER — Encounter: Payer: Self-pay | Admitting: Physical Medicine & Rehabilitation

## 2019-06-16 ENCOUNTER — Other Ambulatory Visit: Payer: Self-pay

## 2019-06-16 VITALS — BP 147/71 | HR 77 | Temp 97.9°F | Ht 70.0 in | Wt 281.0 lb

## 2019-06-16 DIAGNOSIS — G546 Phantom limb syndrome with pain: Secondary | ICD-10-CM | POA: Diagnosis not present

## 2019-06-16 DIAGNOSIS — R5381 Other malaise: Secondary | ICD-10-CM | POA: Diagnosis present

## 2019-06-16 DIAGNOSIS — Z5181 Encounter for therapeutic drug level monitoring: Secondary | ICD-10-CM | POA: Insufficient documentation

## 2019-06-16 DIAGNOSIS — S88119S Complete traumatic amputation at level between knee and ankle, unspecified lower leg, sequela: Secondary | ICD-10-CM

## 2019-06-16 DIAGNOSIS — S88112S Complete traumatic amputation at level between knee and ankle, left lower leg, sequela: Secondary | ICD-10-CM | POA: Diagnosis present

## 2019-06-16 DIAGNOSIS — E1151 Type 2 diabetes mellitus with diabetic peripheral angiopathy without gangrene: Secondary | ICD-10-CM | POA: Insufficient documentation

## 2019-06-16 DIAGNOSIS — G894 Chronic pain syndrome: Secondary | ICD-10-CM | POA: Diagnosis present

## 2019-06-16 DIAGNOSIS — Z79899 Other long term (current) drug therapy: Secondary | ICD-10-CM | POA: Insufficient documentation

## 2019-06-16 MED ORDER — OXYCODONE HCL ER 15 MG PO T12A: 15.0000 mg | EXTENDED_RELEASE_TABLET | Freq: Two times a day (BID) | ORAL | 0 refills | Status: DC

## 2019-06-16 MED ORDER — OXYCODONE HCL 10 MG PO TABS: 10.0000 mg | ORAL_TABLET | Freq: Every day | ORAL | 0 refills | Status: DC | PRN

## 2019-06-16 MED ORDER — TIZANIDINE HCL 4 MG PO TABS: 4.0000 mg | ORAL_TABLET | Freq: Three times a day (TID) | ORAL | 3 refills | Status: DC

## 2019-06-16 NOTE — Progress Notes (Signed)
Subjective:    Patient ID: Jeremy [name], male    DOB: Jun 13, 1950, 69 y.o.   MRN: [id number]  HPI  Sahaj is here in follow up of his chronic pain. He states that his medications aren't "cutting it any more". He takes oxycodone 10mg   5 x daily and oxycontin cr is stil 15mg  bid. His pain is mostly in his low back and legs.   He's followed by the wound care clinic for his ongoing sacral/buttock/leg lesions. THey are showing some improvement.   He is in w/c almost all of the day. He doesn't do much in the way of stretchin or exercise..  Pain Inventory Average Pain 7 Pain Right Now 3 My pain is intermittent, constant, sharp, stabbing and aching  In the last 24 hours, has pain interfered with the following? General activity 6 Relation with others 5 Enjoyment of life 8 What TIME of day is your pain at its worst? daytime Sleep (in general) Fair  Pain is worse with: . Pain improves with: medication Relief from Meds: 2  Mobility use a walker ability to climb steps?  no do you drive?  yes  Function retired  Neuro/Psych No problems in this area  Prior Studies Any changes since last visit?  no  Physicians involved in your care Any changes since last visit?  no   Family History  Problem Relation Age of Onset   Heart disease Brother    Social History   Socioeconomic History   Marital status: Married    Spouse name: Not on file   Number of children: Not on file   Years of education: Not on file   Highest education level: Not on file  Occupational History   Not on file  Tobacco Use   Smoking status: Former Smoker    Packs/day: 0.50    Years: 6.00    Pack years: 3.00    Types: Cigarettes    Quit date: 12/03/1990    Years since quitting: 28.5   Smokeless tobacco: Never Used  Substance and Sexual Activity   Alcohol use: Yes    Alcohol/week: 0.0 standard drinks    Comment: rare   Drug use: Yes   Sexual activity: Not Currently  Other Topics Concern   Not on  file  Social History Narrative   Not on file   Social Determinants of Health   Financial Resource Strain:    Difficulty of Paying Living Expenses: Not on file  Food Insecurity:    Worried About [place] in the Last Year: Not on file   Ran Out of Food in the Last Year: Not on file  Transportation Needs:    Lack of Transportation (Medical): Not on file   Lack of Transportation (Non-Medical): Not on file  Physical Activity:    Days of Exercise per Week: Not on file   Minutes of Exercise per Session: Not on file  Stress:    Feeling of Stress : Not on file  Social Connections:    Frequency of Communication with Friends and Family: Not on file   Frequency of Social Gatherings with Friends and Family: Not on file   Attends Religious Services: Not on file   Active Member of Clubs or Organizations: Not on file   Attends Archivist Meetings: Not on file   Marital Status: Not on file   Past Surgical History:  Procedure Laterality Date   AMPUTATION Left 11/25/2012   Procedure: [place];  Surgeon:  [name], MD;  Location: [facility];  Service: Orthopedics;  Laterality: Left;  Left Below Knee Amputation   APLIGRAFT PLACEMENT Left 07/31/2012   Procedure: Apply Skin Graft;  Surgeon: [name], MD;  Location: [facility];  Service: Orthopedics;  Laterality: Left;   APPLICATION OF WOUND VAC Left 07/31/2012   Procedure: APPLICATION OF WOUND VAC;  Surgeon: [name], MD;  Location: [facility] [place];  Service: Orthopedics;  Laterality: Left;   arm surgery     back stimulator     CARDIAC CATHETERIZATION     Hx: of " around 1985 at [facility]"   CYST EXCISION     Hx: of   I & D EXTREMITY  03/13/2011   Procedure: IRRIGATION AND DEBRIDEMENT EXTREMITY;  Surgeon: [name], MD;  Location: [facility];  Service: Orthopedics;  Laterality: Left;   I & D EXTREMITY Left 11/06/2012   Procedure: IRRIGATION AND DEBRIDEMENT EXTREMITY-Left lower ;  Surgeon: [name],  MD;  Location: [place];  Service: Orthopedics;  Laterality: Left;  Left Leg Irrigation and Debridement, Place Skin Graft, Wound VAC, [place]   skin grafts     SKIN SPLIT GRAFT  03/13/2012   Procedure: SKIN GRAFT SPLIT THICKNESS;  Surgeon: [name], MD;  Location: [place] [place];  Service: Orthopedics;  Laterality: Left;  Excisional Debridement Left Leg, Split Thickness Skin Graft, Wound VAC   stomach stapling     tummy tuck     VEIN LIGATION AND STRIPPING     Past Medical History:  Diagnosis Date   Anemia    Asthma    Cancer ([place])    hx basal cell skin cancer   CHF (congestive heart failure) ([facility])    does not see a cardiologist   COPD (chronic obstructive pulmonary disease) ([place])    3L continuous   Diabetes mellitus    not on medication   DVT (deep venous thrombosis) ([facility])    LLE DVT '80's   GERD (gastroesophageal reflux disease)    Headache(784.0)    sinus headaches   Leg pain    Peripheral neuropathy    Peripheral vascular disease ([facility])    Psoriasis    Shortness of breath    Sleep apnea    uses bipap, sleep study done in [place] 2 years ago, dr [name]   There were no vitals taken for this visit.  Opioid Risk Score:   Fall Risk Score:  `1  Depression screen PHQ 2/9  Depression screen [facility] 2/9 08/20/2018 03/02/2018 05/21/2017 04/18/2017 12/30/2016 11/04/2016 01/04/2016  Decreased Interest 0 0 0 0 0 0 0  Down, Depressed, Hopeless 0 0 0 0 0 0 0  PHQ - 2 Score 0 0 0 0 0 0 0  Altered sleeping - - - - - - -  Tired, decreased energy - - - - - - -  Change in appetite - - - - - - -  Feeling bad or failure about yourself  - - - - - - -  Trouble concentrating - - - - - - -  Moving slowly or fidgety/restless - - - - - - -  Suicidal thoughts - - - - - - -  PHQ-9 Score - - - - - - -  Difficult doing work/chores - - - - - - -     Review of Systems  Constitutional: Negative.   HENT: Negative.   Eyes: Negative.   Respiratory: Negative.   Cardiovascular:  Negative.  Gastrointestinal: Negative.   Endocrine: Negative.   Genitourinary: Negative.   Musculoskeletal: Positive for arthralgias, back pain and gait problem.  Skin: Negative.   Allergic/Immunologic: Negative.   Hematological: Negative.   Psychiatric/Behavioral: Negative.   All other systems reviewed and are negative.      Objective:   Physical Exam  General: No acute distress. Morbidly obese HEENT: EOMI, oral membranes moist Cards: reg rate  Chest: normal effort Abdomen: Soft, NT, ND Skin: dry, intact Extremities: no edema Musculoskeletal: 1+ edema in LE's..left  BK still contracted . Lb ttp.  Neurological: He is alert and oriented to person, place, and time. Decreased LT distally. transferred to chair fairly easily. Skin:wounds dressed. Not visulaized  Psychiatric: pleasant    Assessment & Plan: 1. Left BKA/ [place] Pain: -discussed the reasons I don't want to increase his medication any furhter -Oxycodone 15 mg 1 every 6 hours as needed #120 RF. We will continue the controlled substance monitoring program, this consists of regular clinic visits, examinations, routine drug screening, pill counts as well as use of [place] Controlled Substance Reporting System. [place] today.  . 2. 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 Cymbalta, topamax  -INCREASE tizanidine to 4mg  tid which should help with back  -needs to work on posture, ROM, core strengthening 3. Hx of spinal stimulator  4. COPD: On Continuous Oxygen Therapy @ 3 liters Nasal Cannula per Pulmonology---no changes  5. Reactive depression: cymbalta  30mg   6.Hx of kidney stones: stop topamax? 7. Pressure sores: improving per pt  Fifteen minutes of face to face patient care time were spent during this visit. All questions were encouraged and answered.  Follow up with NP in 2 mos .

## 2019-06-16 NOTE — Patient Instructions (Addendum)
PLEASE FEEL FREE TO CALL OUR OFFICE WITH ANY PROBLEMS OR QUESTIONS [phone])   YOU HAVE TO WORK ON BETTER POSTURE AND STRENGTHENING EXERCISES FOR YOUR LOW BACK AND ABDOMEN.

## 2019-07-13 ENCOUNTER — Other Ambulatory Visit: Payer: Self-pay | Admitting: Physical Medicine & Rehabilitation

## 2019-07-13 NOTE — Telephone Encounter (Signed)
RF signed.  thx

## 2019-07-28 ENCOUNTER — Telehealth: Payer: Self-pay

## 2019-07-28 NOTE — Telephone Encounter (Signed)
Return Jeremy [name] call, PMP was reviewed he picked up his Oxycodone on 07/08/2019, explained to [name] we will not be prescribing his Oxycodone today, he was instructed to send a My-chart message with his pill count. His 30th day is on 08/06/2019, same day as his next scheduled appointment, he verbalizes understanding.

## 2019-07-28 NOTE — Telephone Encounter (Signed)
Patient called requesting refill on Oxycodone 10 mg, last filled #150 on 07/08/2019, next appt 08/06/2019

## 2019-08-06 ENCOUNTER — Other Ambulatory Visit: Payer: Self-pay

## 2019-08-06 ENCOUNTER — Other Ambulatory Visit: Payer: Self-pay | Admitting: Physical Medicine & Rehabilitation

## 2019-08-06 ENCOUNTER — Telehealth: Payer: Self-pay

## 2019-08-06 ENCOUNTER — Encounter: Payer: Medicare Other | Attending: Physical Medicine & Rehabilitation | Admitting: Registered Nurse

## 2019-08-06 VITALS — BP 161/76 | HR 60 | Temp 98.5°F

## 2019-08-06 DIAGNOSIS — G546 Phantom limb syndrome with pain: Secondary | ICD-10-CM

## 2019-08-06 DIAGNOSIS — Z79891 Long term (current) use of opiate analgesic: Secondary | ICD-10-CM | POA: Diagnosis present

## 2019-08-06 DIAGNOSIS — G894 Chronic pain syndrome: Secondary | ICD-10-CM | POA: Diagnosis present

## 2019-08-06 DIAGNOSIS — S88112S Complete traumatic amputation at level between knee and ankle, left lower leg, sequela: Secondary | ICD-10-CM | POA: Insufficient documentation

## 2019-08-06 DIAGNOSIS — R1032 Left lower quadrant pain: Secondary | ICD-10-CM

## 2019-08-06 DIAGNOSIS — G8929 Other chronic pain: Secondary | ICD-10-CM

## 2019-08-06 DIAGNOSIS — M545 Low back pain, unspecified: Secondary | ICD-10-CM

## 2019-08-06 DIAGNOSIS — Z79899 Other long term (current) drug therapy: Secondary | ICD-10-CM | POA: Insufficient documentation

## 2019-08-06 DIAGNOSIS — R5381 Other malaise: Secondary | ICD-10-CM | POA: Insufficient documentation

## 2019-08-06 DIAGNOSIS — E1151 Type 2 diabetes mellitus with diabetic peripheral angiopathy without gangrene: Secondary | ICD-10-CM | POA: Insufficient documentation

## 2019-08-06 DIAGNOSIS — R1031 Right lower quadrant pain: Secondary | ICD-10-CM

## 2019-08-06 DIAGNOSIS — Z5181 Encounter for therapeutic drug level monitoring: Secondary | ICD-10-CM | POA: Diagnosis present

## 2019-08-06 DIAGNOSIS — L89153 Pressure ulcer of sacral region, stage 3: Secondary | ICD-10-CM

## 2019-08-06 DIAGNOSIS — S88119S Complete traumatic amputation at level between knee and ankle, unspecified lower leg, sequela: Secondary | ICD-10-CM

## 2019-08-06 MED ORDER — OXYCODONE HCL 10 MG PO TABS: 10.0000 mg | ORAL_TABLET | Freq: Every day | ORAL | 0 refills | Status: DC | PRN

## 2019-08-06 MED ORDER — OXYCODONE HCL 15 MG PO TABS: 15.0000 mg | ORAL_TABLET | Freq: Every day | ORAL | 0 refills | Status: DC | PRN

## 2019-08-06 NOTE — Patient Instructions (Signed)
Send Jeremy [name] a My- Chart Message at the end of April and No later than May 3 rd. To evaluate medication change.

## 2019-08-06 NOTE — Progress Notes (Signed)
Subjective:    Patient ID: Jeremy [name], male    DOB: 1951/01/28, 69 y.o.   MRN: [id number]  HPI: Jeremy [name] is a 69 y.o. male who returns for follow up appointment for chronic pain and medication refill. He states his pain is located in his lower back and groin pain he reports. Jeremy [name] states he's not receiving adequate pain relief with his current regimen and requested if he could go back to his Oxycodone IR treatment regimen. His pharmacy was called regarding the above, and his new regimen will began on 08/10/2019, he verbalizes understanding. Jeremy [name] was educated on the above medication regimen and he verbalizes understanding.He rates his pain 7. His current exercise regime is performing stretching exercises.  Jeremy [name] Morphine equivalent is 120.00 MME.  Oral Swab Performed Today.  MME will be changed to 112.50 with current regimen of Oxycodone IR.   Pain Inventory Average Pain 7 Pain Right Now 7 My pain is sharp, stabbing and aching  In the last 24 hours, has pain interfered with the following? General activity 7 Relation with others 7 Enjoyment of life 6 What TIME of day is your pain at its worst? morning Sleep (in general) Fair  Pain is worse with: walking and sitting Pain improves with: medication Relief from Meds: 7  Mobility walk with assistance use a walker how many minutes can you walk? 1-2 ability to climb steps?  no do you drive?  yes use a wheelchair transfers alone  Function retired I need assistance with the following:  shopping  Neuro/Psych No problems in this area  Prior Studies Any changes since last visit?  no  Physicians involved in your care Any changes since last visit?  no   Family History  Problem Relation Age of Onset  . Heart disease Brother    Social History   Socioeconomic History  . Marital status: Married    Spouse name: Not on file  . Number of children: Not on file  . Years of education: Not on file  . Highest  education level: Not on file  Occupational History  . Not on file  Tobacco Use  . Smoking status: Former Smoker    Packs/day: 0.50    Years: 6.00    Pack years: 3.00    Types: Cigarettes    Quit date: 12/03/1990    Years since quitting: 28.6  . Smokeless tobacco: Never Used  Substance and Sexual Activity  . Alcohol use: Yes    Alcohol/week: 0.0 standard drinks    Comment: rare  . Drug use: Yes  . Sexual activity: Not Currently  Other Topics Concern  . Not on file  Social History Narrative  . Not on file   Social Determinants of Health   Financial Resource Strain:   . Difficulty of Paying Living Expenses:   Food Insecurity:   . Worried About Charity fundraiser in the Last Year:   . Arboriculturist in the Last Year:   Transportation Needs:   . Film/video editor (Medical):   [name] Lack of Transportation (Non-Medical):   Physical Activity:   . Days of Exercise per Week:   . Minutes of Exercise per Session:   Stress:   . Feeling of Stress :   Social Connections:   . Frequency of Communication with Friends and Family:   . Frequency of Social Gatherings with Friends and Family:   . Attends Religious Services:   . Active Member of  Clubs or Organizations:   . Attends Archivist Meetings:   [name] Marital Status:    Past Surgical History:  Procedure Laterality Date  . AMPUTATION Left 11/25/2012   Procedure: AMPUTATION BELOW KNEE;  Surgeon: [name], MD;  Location: [place];  Service: Orthopedics;  Laterality: Left;  Left Below Knee Amputation  . APLIGRAFT PLACEMENT Left 07/31/2012   Procedure: Apply Skin Graft;  Surgeon: [name], MD;  Location: [place];  Service: Orthopedics;  Laterality: Left;  . APPLICATION OF WOUND VAC Left 07/31/2012   Procedure: APPLICATION OF WOUND VAC;  Surgeon: [name], MD;  Location: [place];  Service: Orthopedics;  Laterality: Left;  . arm surgery    . back stimulator    . CARDIAC CATHETERIZATION     Hx: of " around 1985 at [facility]"   . CYST EXCISION     Hx: of  . I & D EXTREMITY  03/13/2011   Procedure: IRRIGATION AND DEBRIDEMENT EXTREMITY;  Surgeon: [name], MD;  Location: [place];  Service: Orthopedics;  Laterality: Left;  . I & D EXTREMITY Left 11/06/2012   Procedure: IRRIGATION AND DEBRIDEMENT EXTREMITY-Left lower ;  Surgeon: [name], MD;  Location: [place];  Service: Orthopedics;  Laterality: Left;  Left Leg Irrigation and Debridement, Place Skin Graft, Wound VAC, [place]  . skin grafts    . SKIN SPLIT GRAFT  03/13/2012   Procedure: SKIN GRAFT SPLIT THICKNESS;  Surgeon: [name], MD;  Location: [facility];  Service: Orthopedics;  Laterality: Left;  Excisional Debridement Left Leg, Split Thickness Skin Graft, Wound VAC  . stomach stapling    . tummy tuck    . VEIN LIGATION AND STRIPPING     Past Medical History:  Diagnosis Date  . Anemia   . Asthma   . Cancer ([place])    hx basal cell skin cancer  . CHF (congestive heart failure) ([place])    does not see a cardiologist  . COPD (chronic obstructive pulmonary disease) ([place])    3L continuous  . Diabetes mellitus    not on medication  . DVT (deep venous thrombosis) ([facility])    LLE DVT '80's  . GERD (gastroesophageal reflux disease)   . Headache(784.0)    sinus headaches  . Leg pain   . Peripheral neuropathy   . Peripheral vascular disease ([place])   . Psoriasis   . Shortness of breath   . Sleep apnea    uses bipap, sleep study done in [place] 2 years ago, dr [name]   BP (!) 161/76   Pulse 60   Temp 98.5 F (36.9 C)   SpO2 98%   Opioid Risk Score:   Fall Risk Score:  `1  Depression screen PHQ 2/9  Depression screen [facility] 2/9 08/20/2018 03/02/2018 05/21/2017 04/18/2017 12/30/2016 11/04/2016 01/04/2016  Decreased Interest 0 0 0 0 0 0 0  Down, Depressed, Hopeless 0 0 0 0 0 0 0  PHQ - 2 Score 0 0 0 0 0 0 0  Altered sleeping - - - - - - -  Tired, decreased energy - - - - - - -  Change in appetite - - - - - - -  Feeling bad or failure about  yourself  - - - - - - -  Trouble concentrating - - - - - - -  Moving slowly or fidgety/restless - - - - - - -  Suicidal thoughts - - - - - - -  PHQ-9 Score - - - - - - -  Difficult doing work/chores - - - - - - -    Review of Systems  All other systems reviewed and are negative.      Objective:   Physical Exam Vitals and nursing note reviewed.  Constitutional:      Appearance: Normal appearance.  Cardiovascular:     Rate and Rhythm: Normal rate and regular rhythm.     Pulses: Normal pulses.     Heart sounds: Normal heart sounds.  Pulmonary:     Effort: Pulmonary effort is normal.     Breath sounds: Normal breath sounds.     Comments: Continuous Oxygen 3 Liters nasal cannula Musculoskeletal:     Cervical back: Normal range of motion and neck supple.     Comments: Normal Muscle Bulk and Muscle Testing Reveals:  Upper Extremities: Full ROM and Muscle Strength 5/5  Back without spinal tenderness noted  Lower Extremities: Right: Full ROM and Muscle Strength 5/5 Left: BKA Arrived in wheelchair   Skin:    General: Skin is warm and dry.  Neurological:     Mental Status: He is alert and oriented to person, place, and time.  Psychiatric:        Mood and Affect: Mood normal.        Behavior: Behavior normal.           Assessment & Plan:  1.Left BKA/ [place] Pain :Continue current medication regimen: Continue current regimen until 08/10/2019,  [id number] 15mg  one tablet5 times a day as needed # 150.We will review his drug formulary for long acting medication, he verbalizes understanding.08/06/2019 We will continue the opioid monitoring program, this consists of regular clinic visits, examinations, urine drug screen, pill counts as well as use of [place] Controlled Substance Reporting System.Continue: Topamax50 mg one tablet at HS and Amitriptyline..  2. Chronic Bilateral Low Back Pain without Sciatica: Continue HEP as Tolerated. Continue to Monitor.  08/06/2019. 3.Chronic pain syndrome related to his chronic left leg pain: Continue Current medication Regime.08/06/2019. 4. Hx of spinal stimulator. Continue to Monitor.08/06/2019. 5. COPD: On Continuous Oxygen Therapy @ 3 liters Nasal Cannula. Pulmonology Following.08/06/2019 6. Depression: Continue: Cymbalta.08/06/2019 7.. Sacral Decubitus Stage 3: Wound CenterFollowing ( [facility]).Continue adequate nutrition and weight shifting04/16/2021. 8. Groin Pain: Continue to Monitor.   F/U in 2 months  20 minutes of face to face patient care time was spent during this visit. All questions were encouraged and answered.

## 2019-08-06 NOTE — Telephone Encounter (Signed)
Called patient to explain to him he should have enough medication for today, to not need a prescription before appt today. He states he is short on both medications. I told him he would need to discuss with Zella Ball at appt today.

## 2019-08-06 NOTE — Telephone Encounter (Signed)
Patient called stating he has no Oxycodone 10 mg for this morning. Bottle from last month is empty. Has appt this afternoon.

## 2019-08-06 NOTE — Telephone Encounter (Signed)
Jeremy [name] has an appointment today, he should have his Oxycontin. He should have had Oxycodone for today, since today os his 30th day.

## 2019-08-10 ENCOUNTER — Encounter: Payer: Self-pay | Admitting: Registered Nurse

## 2019-08-10 LAB — DRUG TOX MONITOR 1 W/CONF, ORAL FLD
Amphetamines: NEGATIVE ng/mL (ref <10)
Barbiturates: NEGATIVE ng/mL (ref <10)
Benzodiazepines: NEGATIVE ng/mL (ref <0.50)
Buprenorphine: NEGATIVE ng/mL (ref <0.10)
Cocaine: NEGATIVE ng/mL (ref <5.0)
Codeine: NEGATIVE ng/mL (ref <2.5)
Dihydrocodeine: NEGATIVE ng/mL (ref <2.5)
Fentanyl: NEGATIVE ng/mL (ref <0.10)
Heroin Metabolite: NEGATIVE ng/mL (ref <1.0)
Hydrocodone: NEGATIVE ng/mL (ref <2.5)
Hydromorphone: NEGATIVE ng/mL (ref <2.5)
MARIJUANA: NEGATIVE ng/mL (ref <2.5)
MDMA: NEGATIVE ng/mL (ref <10)
Meprobamate: NEGATIVE ng/mL (ref <2.5)
Methadone: NEGATIVE ng/mL (ref <5.0)
Morphine: NEGATIVE ng/mL (ref <2.5)
Nicotine Metabolite: NEGATIVE ng/mL (ref <5.0)
Norhydrocodone: NEGATIVE ng/mL (ref <2.5)
Noroxycodone: 9.7 ng/mL — ABNORMAL HIGH (ref <2.5)
Opiates: POSITIVE ng/mL — AB (ref <2.5)
Oxycodone: 9.9 ng/mL — ABNORMAL HIGH (ref <2.5)
Oxymorphone: NEGATIVE ng/mL (ref <2.5)
Phencyclidine: NEGATIVE ng/mL (ref <10)
Tapentadol: NEGATIVE ng/mL (ref <5.0)
Tramadol: NEGATIVE ng/mL (ref <5.0)
Zolpidem: NEGATIVE ng/mL (ref <5.0)

## 2019-08-10 LAB — DRUG TOX ALC METAB W/CON, ORAL FLD: Alcohol Metabolite: NEGATIVE ng/mL (ref <25)

## 2019-08-16 ENCOUNTER — Telehealth: Payer: Self-pay | Admitting: *Deleted

## 2019-08-16 NOTE — Telephone Encounter (Signed)
Oral swab drug screen was consistent for prescribed medications.  ?

## 2019-08-26 ENCOUNTER — Telehealth: Payer: Self-pay | Admitting: Registered Nurse

## 2019-08-26 MED ORDER — OXYCODONE HCL 15 MG PO TABS: 15.0000 mg | ORAL_TABLET | Freq: Every day | ORAL | 0 refills | Status: DC | PRN

## 2019-08-26 NOTE — Telephone Encounter (Signed)
PMP was Reviewed: Oxycodone E-scribed to accommodate scheduled appointment. Jeremy [name] is aware via My- Chart

## 2019-10-04 ENCOUNTER — Ambulatory Visit: Payer: Medicare Other | Admitting: Registered Nurse

## 2019-10-05 ENCOUNTER — Other Ambulatory Visit: Payer: Self-pay

## 2019-10-05 ENCOUNTER — Encounter: Payer: Medicare Other | Attending: Physical Medicine & Rehabilitation | Admitting: Registered Nurse

## 2019-10-05 VITALS — BP 124/73 | HR 72 | Temp 98.8°F

## 2019-10-05 DIAGNOSIS — R5381 Other malaise: Secondary | ICD-10-CM | POA: Diagnosis present

## 2019-10-05 DIAGNOSIS — E1151 Type 2 diabetes mellitus with diabetic peripheral angiopathy without gangrene: Secondary | ICD-10-CM | POA: Insufficient documentation

## 2019-10-05 DIAGNOSIS — S88119S Complete traumatic amputation at level between knee and ankle, unspecified lower leg, sequela: Secondary | ICD-10-CM | POA: Diagnosis not present

## 2019-10-05 DIAGNOSIS — Z79899 Other long term (current) drug therapy: Secondary | ICD-10-CM | POA: Insufficient documentation

## 2019-10-05 DIAGNOSIS — M545 Low back pain, unspecified: Secondary | ICD-10-CM

## 2019-10-05 DIAGNOSIS — Z5181 Encounter for therapeutic drug level monitoring: Secondary | ICD-10-CM

## 2019-10-05 DIAGNOSIS — Z79891 Long term (current) use of opiate analgesic: Secondary | ICD-10-CM

## 2019-10-05 DIAGNOSIS — G546 Phantom limb syndrome with pain: Secondary | ICD-10-CM

## 2019-10-05 DIAGNOSIS — G894 Chronic pain syndrome: Secondary | ICD-10-CM | POA: Diagnosis present

## 2019-10-05 DIAGNOSIS — L89153 Pressure ulcer of sacral region, stage 3: Secondary | ICD-10-CM

## 2019-10-05 DIAGNOSIS — S88112S Complete traumatic amputation at level between knee and ankle, left lower leg, sequela: Secondary | ICD-10-CM | POA: Insufficient documentation

## 2019-10-05 DIAGNOSIS — G8929 Other chronic pain: Secondary | ICD-10-CM

## 2019-10-05 MED ORDER — TIZANIDINE HCL 4 MG PO TABS: 4.0000 mg | ORAL_TABLET | Freq: Three times a day (TID) | ORAL | 3 refills | Status: DC | PRN

## 2019-10-05 MED ORDER — OXYCODONE HCL 15 MG PO TABS: 15.0000 mg | ORAL_TABLET | Freq: Every day | ORAL | 0 refills | Status: DC | PRN

## 2019-10-05 NOTE — Progress Notes (Signed)
Subjective:    Patient ID: Jeremy [name], male    DOB: 10-02-1950, 69 y.o.   MRN: [id number]  HPI: Jeremy [name] is a 69 y.o. male who returns for follow up appointment for chronic pain and medication refill. He states his pain is located in his lower back pain, buttock pain ( Decubitus Pain) and phantom pain. He states he was hospitalized on 09/14/2019 at [facility], no notes in [place], he also reports he will be scheduled for debridement, awaiting on date.  He  rates his pain 3. current exercise regime is walking and performing stretching exercises.  Oxycontin and Oxycodone 10 mg tablets destroyed via office policy.   Jeremy [name] Morphine equivalent is 112.50  MME.    Last Oral Swab was Performed on 04/16/202, it was consistent.    Pain Inventory Average Pain 8 Pain Right Now 3 My pain is stabbing and aching  In the last 24 hours, has pain interfered with the following? General activity 7 Relation with others 5 Enjoyment of life 7 What TIME of day is your pain at its worst? daytime evening Sleep (in general) Fair  Pain is worse with: inactivity Pain improves with: medication Relief from Meds: 8  Mobility use a walker ability to climb steps?  no do you drive?  yes use a wheelchair needs help with transfers  Function retired  Neuro/Psych No problems in this area  Prior Studies Any changes since last visit?  no  Physicians involved in your care Any changes since last visit?  no   Family History  Problem Relation Age of Onset  . Heart disease Brother    Social History   Socioeconomic History  . Marital status: Married    Spouse name: Not on file  . Number of children: Not on file  . Years of education: Not on file  . Highest education level: Not on file  Occupational History  . Not on file  Tobacco Use  . Smoking status: Former Smoker    Packs/day: 0.50    Years: 6.00    Pack years: 3.00    Types: Cigarettes    Quit date: 12/03/1990    Years since  quitting: 28.8  . Smokeless tobacco: Never Used  Substance and Sexual Activity  . Alcohol use: Yes    Alcohol/week: 0.0 standard drinks    Comment: rare  . Drug use: Yes  . Sexual activity: Not Currently  Other Topics Concern  . Not on file  Social History Narrative  . Not on file   Social Determinants of Health   Financial Resource Strain:   . Difficulty of Paying Living Expenses:   Food Insecurity:   . Worried About Charity fundraiser in the Last Year:   . Arboriculturist in the Last Year:   Transportation Needs:   . Film/video editor (Medical):   [name] Lack of Transportation (Non-Medical):   Physical Activity:   . Days of Exercise per Week:   . Minutes of Exercise per Session:   Stress:   . Feeling of Stress :   Social Connections:   . Frequency of Communication with Friends and Family:   . Frequency of Social Gatherings with Friends and Family:   . Attends Religious Services:   . Active Member of Clubs or Organizations:   . Attends Archivist Meetings:   [name] Marital Status:    Past Surgical History:  Procedure Laterality Date  . AMPUTATION Left 11/25/2012  Procedure: AMPUTATION BELOW KNEE;  Surgeon: [name], MD;  Location: [place];  Service: Orthopedics;  Laterality: Left;  Left Below Knee Amputation  . APLIGRAFT PLACEMENT Left 07/31/2012   Procedure: Apply Skin Graft;  Surgeon: [name], MD;  Location: [place];  Service: Orthopedics;  Laterality: Left;  . APPLICATION OF WOUND VAC Left 07/31/2012   Procedure: APPLICATION OF WOUND VAC;  Surgeon: [name], MD;  Location: [place];  Service: Orthopedics;  Laterality: Left;  . arm surgery    . back stimulator    . CARDIAC CATHETERIZATION     Hx: of " around 1985 at [facility]"  . CYST EXCISION     Hx: of  . I & D EXTREMITY  03/13/2011   Procedure: IRRIGATION AND DEBRIDEMENT EXTREMITY;  Surgeon: [name], MD;  Location: [place];  Service: Orthopedics;  Laterality: Left;  . I & D EXTREMITY Left 11/06/2012     Procedure: IRRIGATION AND DEBRIDEMENT EXTREMITY-Left lower ;  Surgeon: [name], MD;  Location: [place];  Service: Orthopedics;  Laterality: Left;  Left Leg Irrigation and Debridement, Place Skin Graft, Wound VAC, [place]  . skin grafts    . SKIN SPLIT GRAFT  03/13/2012   Procedure: SKIN GRAFT SPLIT THICKNESS;  Surgeon: [name], MD;  Location: [place];  Service: Orthopedics;  Laterality: Left;  Excisional Debridement Left Leg, Split Thickness Skin Graft, Wound VAC  . stomach stapling    . tummy tuck    . VEIN LIGATION AND STRIPPING     Past Medical History:  Diagnosis Date  . Anemia   . Asthma   . Cancer ([place])    hx basal cell skin cancer  . CHF (congestive heart failure) ([place])    does not see a cardiologist  . COPD (chronic obstructive pulmonary disease) ([place])    3L continuous  . Diabetes mellitus    not on medication  . DVT (deep venous thrombosis) ([facility])    LLE DVT '80's  . GERD (gastroesophageal reflux disease)   . Headache(784.0)    sinus headaches  . Leg pain   . Peripheral neuropathy   . Peripheral vascular disease ([place])   . Psoriasis   . Shortness of breath   . Sleep apnea    uses bipap, sleep study done in [place] 2 years ago, dr [name]   BP 124/73   Pulse 72   Temp 98.8 F (37.1 C)   SpO2 94%   Opioid Risk Score:   Fall Risk Score:  `1  Depression screen PHQ 2/9  Depression screen [facility] 2/9 08/20/2018 03/02/2018 05/21/2017 04/18/2017 12/30/2016 11/04/2016 01/04/2016  Decreased Interest 0 0 0 0 0 0 0  Down, Depressed, Hopeless 0 0 0 0 0 0 0  PHQ - 2 Score 0 0 0 0 0 0 0  Altered sleeping - - - - - - -  Tired, decreased energy - - - - - - -  Change in appetite - - - - - - -  Feeling bad or failure about yourself  - - - - - - -  Trouble concentrating - - - - - - -  Moving slowly or fidgety/restless - - - - - - -  Suicidal thoughts - - - - - - -  PHQ-9 Score - - - - - - -  Difficult doing work/chores - - - - - - -    Review of Systems      Objective:  Physical Exam Vitals and nursing note reviewed.  Constitutional:      Appearance: Normal appearance.  Cardiovascular:     Rate and Rhythm: Normal rate and regular rhythm.     Pulses: Normal pulses.     Heart sounds: Normal heart sounds.  Pulmonary:     Comments: Continuous Oxygen @ 3 Liters nasal cannula Musculoskeletal:     Cervical back: Normal range of motion and neck supple.     Comments: Normal Muscle Bulk and Muscle Testing Reveals:  Upper Extremities: Full ROM and Muscle Strength 5/5  Lower Extremities: Right: Full ROM and muscle Strength 5/5 Left: BKA Arrived in Wheelchair.    Skin:    General: Skin is warm and dry.  Neurological:     Mental Status: He is alert and oriented to person, place, and time.  Psychiatric:        Mood and Affect: Mood normal.        Behavior: Behavior normal.           Assessment & Plan:  1.Left BKA/ [place] Pain :Continue current medication regimen: Continue current regimen until 10/05/2019,  Refilled:oxycodone 15mg  one tablet5 times a day as needed # 150. Second script sent for the following month. We will continue the opioid monitoring program, this consists of regular clinic visits, examinations, urine drug screen, pill counts as well as use of [place] Controlled Substance Reporting System.Continue: Topamax50 mg one tablet at HS and Amitriptyline..  2. Chronic Bilateral Low Back Pain without Sciatica: Continue HEP as Tolerated. Continue to Monitor. 10/05/2019. 3.Chronic pain syndrome: Continue Current medication Regime.10/05/2019. 4. Hx of spinal stimulator. Continue to Monitor.10/05/2019. 5. COPD: On Continuous Oxygen Therapy @ 3 liters Nasal Cannula. Pulmonology Following.10/05/2019 6. Depression: Continue: Cymbalta.10/05/2019 7.. Sacral Decubitus Stage 3: Wound CenterFollowing ( [facility]).Continue adequate nutrition and weight shifting06/16/2021.   F/U in 2 months  20 minutes of face to face  patient care time was spent during this visit. All questions were encouraged and answered.

## 2019-10-07 ENCOUNTER — Encounter: Payer: Self-pay | Admitting: Registered Nurse

## 2019-11-30 ENCOUNTER — Encounter: Payer: Medicare Other | Attending: Physical Medicine & Rehabilitation | Admitting: Registered Nurse

## 2019-11-30 ENCOUNTER — Other Ambulatory Visit: Payer: Self-pay

## 2019-11-30 DIAGNOSIS — M545 Low back pain, unspecified: Secondary | ICD-10-CM

## 2019-11-30 DIAGNOSIS — Z79899 Other long term (current) drug therapy: Secondary | ICD-10-CM | POA: Insufficient documentation

## 2019-11-30 DIAGNOSIS — R5381 Other malaise: Secondary | ICD-10-CM | POA: Insufficient documentation

## 2019-11-30 DIAGNOSIS — Z5181 Encounter for therapeutic drug level monitoring: Secondary | ICD-10-CM | POA: Diagnosis not present

## 2019-11-30 DIAGNOSIS — L89153 Pressure ulcer of sacral region, stage 3: Secondary | ICD-10-CM

## 2019-11-30 DIAGNOSIS — E1151 Type 2 diabetes mellitus with diabetic peripheral angiopathy without gangrene: Secondary | ICD-10-CM | POA: Insufficient documentation

## 2019-11-30 DIAGNOSIS — G894 Chronic pain syndrome: Secondary | ICD-10-CM | POA: Insufficient documentation

## 2019-11-30 DIAGNOSIS — Z79891 Long term (current) use of opiate analgesic: Secondary | ICD-10-CM | POA: Insufficient documentation

## 2019-11-30 DIAGNOSIS — G546 Phantom limb syndrome with pain: Secondary | ICD-10-CM

## 2019-11-30 DIAGNOSIS — S88112S Complete traumatic amputation at level between knee and ankle, left lower leg, sequela: Secondary | ICD-10-CM | POA: Insufficient documentation

## 2019-11-30 DIAGNOSIS — S88119S Complete traumatic amputation at level between knee and ankle, unspecified lower leg, sequela: Secondary | ICD-10-CM | POA: Diagnosis not present

## 2019-11-30 DIAGNOSIS — G8929 Other chronic pain: Secondary | ICD-10-CM

## 2019-11-30 MED ORDER — OXYCODONE HCL 15 MG PO TABS: 15.0000 mg | ORAL_TABLET | Freq: Every day | ORAL | 0 refills | Status: DC | PRN

## 2019-11-30 NOTE — Progress Notes (Addendum)
Subjective:    Patient ID: Jeremy [name], male    DOB: Feb 05, 1951, 69 y.o.   MRN: [id number]  HPI: Jeremy [name] is a 69 y.o. male who called office asking for a virtual visit due to vomiting for the last two days and chest congestion. Jeremy [name] was instructed to call his PCP and schedule an appointment, he states he will call his PCP. Jeremy [name] appointment was changed to virtual visit, he agrees with virtual visit and verbalizes understanding. He reports sacral pain R/T decubitus, wound care following and phantom pain. He rates his pain 4. He states he's not following his current exercise regime, he was instructed to continue with frequent position changes while in bed and when he is sitting in his wheelchair. He verbalizes understanding.   [name] Morphine equivalent is 112.50 MME.  Last Oral Swab was Performed on 08/06/2019, it was consistent.   Pain Inventory Average Pain 8 Pain Right Now 4 My pain is stabbing and aching  In the last 24 hours, has pain interfered with the following? General activity 3 Relation with others 3 Enjoyment of life 3 What TIME of day is your pain at its worst? daytime evening Sleep (in general) Fair  Pain is worse with: inactivity Pain improves with: medication Relief from Meds: 8  Mobility use a walker ability to climb steps?  no do you drive?  yes use a wheelchair needs help with transfers  Function retired  Neuro/Psych No problems in this area  Prior Studies Any changes since last visit?  no  Physicians involved in your care surgeon   Family History  Problem Relation Age of Onset  . Heart disease Brother    Social History   Socioeconomic History  . Marital status: Married    Spouse name: Not on file  . Number of children: Not on file  . Years of education: Not on file  . Highest education level: Not on file  Occupational History  . Not on file  Tobacco Use  . Smoking status: Former Smoker    Packs/day: 0.50    Years: 6.00     Pack years: 3.00    Types: Cigarettes    Quit date: 12/03/1990    Years since quitting: 29.0  . Smokeless tobacco: Never Used  Substance and Sexual Activity  . Alcohol use: Yes    Alcohol/week: 0.0 standard drinks    Comment: rare  . Drug use: Yes  . Sexual activity: Not Currently  Other Topics Concern  . Not on file  Social History Narrative  . Not on file   Social Determinants of Health   Financial Resource Strain:   . Difficulty of Paying Living Expenses:   Food Insecurity:   . Worried About Charity fundraiser in the Last Year:   . Arboriculturist in the Last Year:   Transportation Needs:   . Film/video editor (Medical):   [name] Lack of Transportation (Non-Medical):   Physical Activity:   . Days of Exercise per Week:   . Minutes of Exercise per Session:   Stress:   . Feeling of Stress :   Social Connections:   . Frequency of Communication with Friends and Family:   . Frequency of Social Gatherings with Friends and Family:   . Attends Religious Services:   . Active Member of Clubs or Organizations:   . Attends Archivist Meetings:   [name] Marital Status:    Past Surgical History:  Procedure Laterality Date  . AMPUTATION Left 11/25/2012   Procedure: AMPUTATION BELOW KNEE;  Surgeon: [name], MD;  Location: [place];  Service: Orthopedics;  Laterality: Left;  Left Below Knee Amputation  . APLIGRAFT PLACEMENT Left 07/31/2012   Procedure: Apply Skin Graft;  Surgeon: [name], MD;  Location: [place];  Service: Orthopedics;  Laterality: Left;  . APPLICATION OF WOUND VAC Left 07/31/2012   Procedure: APPLICATION OF WOUND VAC;  Surgeon: [name], MD;  Location: [place];  Service: Orthopedics;  Laterality: Left;  . arm surgery    . back stimulator    . CARDIAC CATHETERIZATION     Hx: of " around 1985 at [facility]"  . CYST EXCISION     Hx: of  . I & D EXTREMITY  03/13/2011   Procedure: IRRIGATION AND DEBRIDEMENT EXTREMITY;  Surgeon: [name], MD;  Location:  [place];  Service: Orthopedics;  Laterality: Left;  . I & D EXTREMITY Left 11/06/2012   Procedure: IRRIGATION AND DEBRIDEMENT EXTREMITY-Left lower ;  Surgeon: [name], MD;  Location: [place];  Service: Orthopedics;  Laterality: Left;  Left Leg Irrigation and Debridement, Place Skin Graft, Wound VAC, [place]  . skin grafts    . SKIN SPLIT GRAFT  03/13/2012   Procedure: SKIN GRAFT SPLIT THICKNESS;  Surgeon: [name], MD;  Location: [place];  Service: Orthopedics;  Laterality: Left;  Excisional Debridement Left Leg, Split Thickness Skin Graft, Wound VAC  . stomach stapling    . tummy tuck    . VEIN LIGATION AND STRIPPING     Past Medical History:  Diagnosis Date  . Anemia   . Asthma   . Cancer ([place])    hx basal cell skin cancer  . CHF (congestive heart failure) ([place])    does not see a cardiologist  . COPD (chronic obstructive pulmonary disease) ([place])    3L continuous  . Diabetes mellitus    not on medication  . DVT (deep venous thrombosis) ([facility])    LLE DVT '80's  . GERD (gastroesophageal reflux disease)   . Headache(784.0)    sinus headaches  . Leg pain   . Peripheral neuropathy   . Peripheral vascular disease ([place])   . Psoriasis   . Shortness of breath   . Sleep apnea    uses bipap, sleep study done in [place] 2 years ago, dr [name]   There were no vitals taken for this visit.  Opioid Risk Score:   Fall Risk Score:  `1  Depression screen PHQ 2/9  Depression screen [facility] 2/9 08/20/2018 03/02/2018 05/21/2017 04/18/2017 12/30/2016 11/04/2016 01/04/2016  Decreased Interest 0 0 0 0 0 0 0  Down, Depressed, Hopeless 0 0 0 0 0 0 0  PHQ - 2 Score 0 0 0 0 0 0 0  Altered sleeping - - - - - - -  Tired, decreased energy - - - - - - -  Change in appetite - - - - - - -  Feeling bad or failure about yourself  - - - - - - -  Trouble concentrating - - - - - - -  Moving slowly or fidgety/restless - - - - - - -  Suicidal thoughts - - - - - - -  PHQ-9 Score - - - - - - -   Difficult doing work/chores - - - - - - -    Review of Systems  All other systems reviewed and  are negative.      Objective:   Physical Exam Vitals and nursing note reviewed.  Musculoskeletal:     Comments: No Physical Performed: Virtual Visit           Assessment & Plan:  1.Left BKA/ [place] Pain :Continue current medication regimen:Continue current regimen until 11/30/2019,  Refilled:oxycodone 15mg  one tablet5 times a day as needed # 150. Second script sent for the following month. We will continue the opioid monitoring program, this consists of regular clinic visits, examinations, urine drug screen, pill counts as well as use of [place] Controlled Substance Reporting System.Continue: Topamax50 mg one tablet at HS and Amitriptyline..  2. Chronic Bilateral Low Back Pain without Sciatica: Continue HEP as Tolerated. Continue to Monitor. 11/30/2019. 3.Chronic pain syndrome: Continue Current medication Regime.11/30/2019. 4. Hx of spinal stimulator. Continue to Monitor.11/30/2019. 5. COPD: On Continuous Oxygen Therapy @ 3 liters Nasal Cannula. Pulmonology Following.11/30/2019 6. Depression: Continue: Cymbalta.11/30/2019 7.. Sacral Decubitus Stage 3: Wound CenterFollowing ( [facility]).Continue adequate nutrition and weight shifting08/01/2020.  F/U in 2 months  Virtual Visit: My-Chart Video Visit Establish Patient Location of Patient: In his Home Location of Provider: In the Office Total Time Spent: 20 Minutes

## 2019-12-01 ENCOUNTER — Encounter: Payer: Self-pay | Admitting: Registered Nurse

## 2020-01-27 ENCOUNTER — Encounter: Payer: Medicare Other | Attending: Physical Medicine & Rehabilitation | Admitting: Registered Nurse

## 2020-01-27 ENCOUNTER — Encounter: Payer: Self-pay | Admitting: Registered Nurse

## 2020-01-27 ENCOUNTER — Other Ambulatory Visit: Payer: Self-pay

## 2020-01-27 VITALS — Ht 70.0 in

## 2020-01-27 DIAGNOSIS — L89153 Pressure ulcer of sacral region, stage 3: Secondary | ICD-10-CM

## 2020-01-27 DIAGNOSIS — Z5181 Encounter for therapeutic drug level monitoring: Secondary | ICD-10-CM | POA: Insufficient documentation

## 2020-01-27 DIAGNOSIS — S88119S Complete traumatic amputation at level between knee and ankle, unspecified lower leg, sequela: Secondary | ICD-10-CM

## 2020-01-27 DIAGNOSIS — M545 Low back pain, unspecified: Secondary | ICD-10-CM

## 2020-01-27 DIAGNOSIS — G894 Chronic pain syndrome: Secondary | ICD-10-CM | POA: Diagnosis not present

## 2020-01-27 DIAGNOSIS — Z79891 Long term (current) use of opiate analgesic: Secondary | ICD-10-CM | POA: Insufficient documentation

## 2020-01-27 DIAGNOSIS — G8929 Other chronic pain: Secondary | ICD-10-CM

## 2020-01-27 MED ORDER — AMITRIPTYLINE HCL 25 MG PO TABS: 25.0000 mg | ORAL_TABLET | Freq: Every day | ORAL | 2 refills | Status: DC

## 2020-01-27 MED ORDER — OXYCODONE HCL 15 MG PO TABS: 15.0000 mg | ORAL_TABLET | Freq: Every day | ORAL | 0 refills | Status: DC | PRN

## 2020-01-27 NOTE — Progress Notes (Signed)
Subjective:    Patient ID: Jeremy [name], male    DOB: Apr 14, 1951, 69 y.o.   MRN: [id number]  HPI: Jeremy [name] is a 69 y.o. male whose appointment was changed to a telephone office visit to reduce the risk of exposure to the COVID-19 virus and to help Jeremy [name]  remain healthy and safe. The telephone  visit will also provide continuity of care. Jeremy [name] agrees with telephone visit and verbalizes understanding. He states his pain is located in  his lower back radiating into his buttocks. He rates his pain 3. His current exercise regime is  performing stretching exercises. Geryl Rankins CMA asked The Health and History Questions, this provider and Mancel Parsons verified we were speaking with the correct person using two identifiers.   Jeremy [name] Morphine equivalent is 112.50 MME. Last Oral Swab was Performed on 08/06/2019, it was consistent.     Pain Inventory Average Pain 7 Pain Right Now 3 My pain is constant, stabbing and aching  In the last 24 hours, has pain interfered with the following? General activity 7 Relation with others 7 Enjoyment of life 8 What TIME of day is your pain at its worst? morning , daytime, evening and night Sleep (in general) Poor  Pain is worse with: sitting and inactivity Pain improves with: medication Relief from Meds: 10  Family History  Problem Relation Age of Onset  . Heart disease Brother    Social History   Socioeconomic History  . Marital status: Married    Spouse name: Not on file  . Number of children: Not on file  . Years of education: Not on file  . Highest education level: Not on file  Occupational History  . Not on file  Tobacco Use  . Smoking status: Former Smoker    Packs/day: 0.50    Years: 6.00    Pack years: 3.00    Types: Cigarettes    Quit date: 12/03/1990    Years since quitting: 29.1  . Smokeless tobacco: Never Used  Substance and Sexual Activity  . Alcohol use: Yes    Alcohol/week: 0.0 standard drinks    Comment:  rare  . Drug use: Yes  . Sexual activity: Not Currently  Other Topics Concern  . Not on file  Social History Narrative  . Not on file   Social Determinants of Health   Financial Resource Strain:   . Difficulty of Paying Living Expenses: Not on file  Food Insecurity:   . Worried About Charity fundraiser in the Last Year: Not on file  . Ran Out of Food in the Last Year: Not on file  Transportation Needs:   . Lack of Transportation (Medical): Not on file  . Lack of Transportation (Non-Medical): Not on file  Physical Activity:   . Days of Exercise per Week: Not on file  . Minutes of Exercise per Session: Not on file  Stress:   . Feeling of Stress : Not on file  Social Connections:   . Frequency of Communication with Friends and Family: Not on file  . Frequency of Social Gatherings with Friends and Family: Not on file  . Attends Religious Services: Not on file  . Active Member of Clubs or Organizations: Not on file  . Attends Archivist Meetings: Not on file  . Marital Status: Not on file   Past Surgical History:  Procedure Laterality Date  . AMPUTATION Left 11/25/2012   Procedure: AMPUTATION BELOW KNEE;  Surgeon:  [name], MD;  Location: [place];  Service: Orthopedics;  Laterality: Left;  Left Below Knee Amputation  . APLIGRAFT PLACEMENT Left 07/31/2012   Procedure: Apply Skin Graft;  Surgeon: [name], MD;  Location: [place];  Service: Orthopedics;  Laterality: Left;  . APPLICATION OF WOUND VAC Left 07/31/2012   Procedure: APPLICATION OF WOUND VAC;  Surgeon: [name], MD;  Location: [place];  Service: Orthopedics;  Laterality: Left;  . arm surgery    . back stimulator    . CARDIAC CATHETERIZATION     Hx: of " around 1985 at [facility]"  . CYST EXCISION     Hx: of  . I & D EXTREMITY  03/13/2011   Procedure: IRRIGATION AND DEBRIDEMENT EXTREMITY;  Surgeon: [name], MD;  Location: [place];  Service: Orthopedics;  Laterality: Left;  . I & D EXTREMITY Left 11/06/2012    Procedure: IRRIGATION AND DEBRIDEMENT EXTREMITY-Left lower ;  Surgeon: [name], MD;  Location: [place];  Service: Orthopedics;  Laterality: Left;  Left Leg Irrigation and Debridement, Place Skin Graft, Wound VAC, [place]  . skin grafts    . SKIN SPLIT GRAFT  03/13/2012   Procedure: SKIN GRAFT SPLIT THICKNESS;  Surgeon: [name], MD;  Location: [place];  Service: Orthopedics;  Laterality: Left;  Excisional Debridement Left Leg, Split Thickness Skin Graft, Wound VAC  . stomach stapling    . tummy tuck    . VEIN LIGATION AND STRIPPING     Past Surgical History:  Procedure Laterality Date  . AMPUTATION Left 11/25/2012   Procedure: AMPUTATION BELOW KNEE;  Surgeon: [name], MD;  Location: [place];  Service: Orthopedics;  Laterality: Left;  Left Below Knee Amputation  . APLIGRAFT PLACEMENT Left 07/31/2012   Procedure: Apply Skin Graft;  Surgeon: [name], MD;  Location: [place];  Service: Orthopedics;  Laterality: Left;  . APPLICATION OF WOUND VAC Left 07/31/2012   Procedure: APPLICATION OF WOUND VAC;  Surgeon: [name], MD;  Location: [place];  Service: Orthopedics;  Laterality: Left;  . arm surgery    . back stimulator    . CARDIAC CATHETERIZATION     Hx: of " around 1985 at [facility]"  . CYST EXCISION     Hx: of  . I & D EXTREMITY  03/13/2011   Procedure: IRRIGATION AND DEBRIDEMENT EXTREMITY;  Surgeon: [name], MD;  Location: [place];  Service: Orthopedics;  Laterality: Left;  . I & D EXTREMITY Left 11/06/2012   Procedure: IRRIGATION AND DEBRIDEMENT EXTREMITY-Left lower ;  Surgeon: [name], MD;  Location: [place];  Service: Orthopedics;  Laterality: Left;  Left Leg Irrigation and Debridement, Place Skin Graft, Wound VAC, [place]  . skin grafts    . SKIN SPLIT GRAFT  03/13/2012   Procedure: SKIN GRAFT SPLIT THICKNESS;  Surgeon: [name], MD;  Location: [place];  Service: Orthopedics;  Laterality: Left;  Excisional Debridement Left Leg, Split Thickness Skin Graft, Wound  VAC  . stomach stapling    . tummy tuck    . VEIN LIGATION AND STRIPPING     Past Medical History:  Diagnosis Date  . Anemia   . Asthma   . Cancer ([place])    hx basal cell skin cancer  . CHF (congestive heart failure) ([place])    does not see a cardiologist  . COPD (chronic obstructive pulmonary disease) ([place])    3L continuous  . Diabetes mellitus  not on medication  . DVT (deep venous thrombosis) ([facility])    LLE DVT '80's  . GERD (gastroesophageal reflux disease)   . Headache(784.0)    sinus headaches  . Leg pain   . Peripheral neuropathy   . Peripheral vascular disease ([place])   . Psoriasis   . Shortness of breath   . Sleep apnea    uses bipap, sleep study done in [place] 2 years ago, dr [name]   Ht 5\' 10"  (1.778 m)   BMI 40.32 kg/m   Opioid Risk Score:   Fall Risk Score:  `1  Depression screen PHQ 2/9  Depression screen [facility] 2/9 08/20/2018 03/02/2018 05/21/2017 04/18/2017 12/30/2016 11/04/2016 01/04/2016  Decreased Interest 0 0 0 0 0 0 0  Down, Depressed, Hopeless 0 0 0 0 0 0 0  PHQ - 2 Score 0 0 0 0 0 0 0  Altered sleeping - - - - - - -  Tired, decreased energy - - - - - - -  Change in appetite - - - - - - -  Feeling bad or failure about yourself  - - - - - - -  Trouble concentrating - - - - - - -  Moving slowly or fidgety/restless - - - - - - -  Suicidal thoughts - - - - - - -  PHQ-9 Score - - - - - - -  Difficult doing work/chores - - - - - - -    Review of Systems  Constitutional: Negative.   HENT: Negative.   Eyes: Negative.   Respiratory: Negative.   Cardiovascular: Negative.   Gastrointestinal: Negative.   Endocrine: Negative.   Genitourinary: Negative.   Musculoskeletal: Positive for back pain.       Residual limb pain  Skin: Negative.   Allergic/Immunologic: Negative.   Neurological: Negative.   Psychiatric/Behavioral: Negative.   All other systems reviewed and are negative.      Objective:   Physical Exam Vitals and nursing note  reviewed.  Musculoskeletal:     Comments: No Physical Exam Performed: Telephone Visit           Assessment & Plan:  1.Left BKA/ [place] Pain :Continue current medication regimen:Continue current regimen until 01/27/2020,  Refilled:oxycodone 15mg  one tablet5 times a day as needed # 150.Second script sent for the following month. We will continue the opioid monitoring program, this consists of regular clinic visits, examinations, urine drug screen, pill counts as well as use of [place] Controlled Substance Reporting system. A 12 month History has been reviewed on the [place] Controlled Substance Reporting System on 01/27/2020.Continue: Topamax50 mg one tablet at HS and Amitriptyline..  2. Chronic Bilateral Low Back Pain without Sciatica: Continue HEP as Tolerated. Continue to Monitor. 01/27/2020. 3.Chronic pain syndrome:Continue Current medication Regime.01/27/2020. 4. Hx of spinal stimulator. Continue to Monitor.01/27/2020. 5. COPD: On Continuous Oxygen Therapy @ 3 liters Nasal Cannula. Pulmonology Following.01/27/2020 6. Depression: Continue: Cymbalta.01/27/2020 7.. Sacral Decubitus Stage 3: Wound CenterFollowing ([facility]).Continue adequate nutrition and weight shifting10/10/2019.  F/U in 2 months  Telephone Visit Establish Patient Location of Patient: In his Home Location of Provider: In the Office Total Time Spent: 15 Minutes

## 2020-02-29 ENCOUNTER — Telehealth: Payer: Self-pay | Admitting: Registered Nurse

## 2020-02-29 MED ORDER — TIZANIDINE HCL 4 MG PO TABS: 4.0000 mg | ORAL_TABLET | Freq: Three times a day (TID) | ORAL | 3 refills | Status: DC | PRN

## 2020-02-29 NOTE — Telephone Encounter (Signed)
[name] requesting Tizanidine refill for his muscle spasms: Tizanidine ordered. [name] is aware of the above via My-chart message.

## 2020-03-22 ENCOUNTER — Telehealth: Payer: Self-pay | Admitting: Registered Nurse

## 2020-03-22 MED ORDER — OXYCODONE HCL 15 MG PO TABS: 15.0000 mg | ORAL_TABLET | Freq: Every day | ORAL | 0 refills | Status: DC | PRN

## 2020-03-22 NOTE — Telephone Encounter (Signed)
PMP was Reviewed: Oxycodone e-scribed today. Mr. Armijo appointment was changed due to staffing shortage. Mr. Henshaw is aware of the above via My Chart Message.

## 2020-03-23 ENCOUNTER — Encounter: Payer: Medicare Other | Admitting: Registered Nurse

## 2020-03-29 ENCOUNTER — Ambulatory Visit: Payer: Medicare Other | Admitting: Registered Nurse

## 2020-04-04 ENCOUNTER — Encounter: Payer: Medicare Other | Attending: Physical Medicine & Rehabilitation | Admitting: Registered Nurse

## 2020-04-04 ENCOUNTER — Other Ambulatory Visit: Payer: Self-pay

## 2020-04-04 ENCOUNTER — Encounter: Payer: Self-pay | Admitting: Registered Nurse

## 2020-04-04 VITALS — BP 138/89 | HR 103 | Temp 98.5°F | Ht 70.0 in | Wt 256.6 lb

## 2020-04-04 DIAGNOSIS — G8929 Other chronic pain: Secondary | ICD-10-CM | POA: Diagnosis present

## 2020-04-04 DIAGNOSIS — G894 Chronic pain syndrome: Secondary | ICD-10-CM | POA: Diagnosis present

## 2020-04-04 DIAGNOSIS — S88119S Complete traumatic amputation at level between knee and ankle, unspecified lower leg, sequela: Secondary | ICD-10-CM

## 2020-04-04 DIAGNOSIS — L89153 Pressure ulcer of sacral region, stage 3: Secondary | ICD-10-CM | POA: Diagnosis present

## 2020-04-04 DIAGNOSIS — Z79891 Long term (current) use of opiate analgesic: Secondary | ICD-10-CM

## 2020-04-04 DIAGNOSIS — Z5181 Encounter for therapeutic drug level monitoring: Secondary | ICD-10-CM | POA: Diagnosis present

## 2020-04-04 DIAGNOSIS — M545 Low back pain, unspecified: Secondary | ICD-10-CM | POA: Diagnosis present

## 2020-04-04 LAB — UNLABELED: Test Ordered On Req: 9397593976

## 2020-04-04 MED ORDER — OXYCODONE HCL 15 MG PO TABS: 15.0000 mg | ORAL_TABLET | Freq: Every day | ORAL | 0 refills | Status: DC | PRN

## 2020-04-04 NOTE — Progress Notes (Signed)
Subjective:    Patient ID: Jeremy [name], male    DOB: [birth year], 69 y.o.   MRN: [id number]  HPI: Jeremy [name] is a 69 y.o. male who returns for follow up appointment for chronic pain and medication refill. 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 He rates his pain 4. He's not following his usual  exercise regime due to increase intensity of pain he reports. Sacral region assess, small opening noted, duoderm dressing applied.   Jeremy [name] Morphine equivalent is 112.50  MME.  Oral Swab was Performed today.    Pain Inventory Average Pain 7 Pain Right Now 4 My pain is constant, sharp, stabbing and aching  In the last 24 hours, has pain interfered with the following? General activity 7 Relation with others 6 Enjoyment of life 7 What TIME of day is your pain at its worst? daytime Sleep (in general) Poor  Pain is worse with: inactivity Pain improves with: medication Relief from Meds: 4  Family History  Problem Relation Age of Onset  . Heart disease Brother    Social History   Socioeconomic History  . Marital status: Married    Spouse name: Not on file  . Number of children: Not on file  . Years of education: Not on file  . Highest education level: Not on file  Occupational History  . Not on file  Tobacco Use  . Smoking status: Former Smoker    Packs/day: 0.50    Years: 6.00    Pack years: 3.00    Types: Cigarettes    Quit date: 12/03/1990    Years since quitting: 29.3  . Smokeless tobacco: Never Used  Substance and Sexual Activity  . Alcohol use: Yes    Alcohol/week: 0.0 standard drinks    Comment: rare  . Drug use: Yes  . Sexual activity: Not Currently  Other Topics Concern  . Not on file  Social History Narrative  . Not on file   Social Determinants of Health   Financial Resource Strain: Not on file  Food  Insecurity: Not on file  Transportation Needs: Not on file  Physical Activity: Not on file  Stress: Not on file  Social Connections: Not on file   Past Surgical History:  Procedure Laterality Date  . AMPUTATION Left 11/25/2012   Procedure: AMPUTATION BELOW KNEE;  Surgeon: [name], MD;  Location: [place];  Service: Orthopedics;  Laterality: Left;  Left Below Knee Amputation  . APLIGRAFT PLACEMENT Left 07/31/2012   Procedure: Apply Skin Graft;  Surgeon: [name], MD;  Location: [place];  Service: Orthopedics;  Laterality: Left;  . APPLICATION OF WOUND VAC Left 07/31/2012   Procedure: APPLICATION OF WOUND VAC;  Surgeon: [name], MD;  Location: [place];  Service: Orthopedics;  Laterality: Left;  . arm surgery    . back stimulator    . CARDIAC CATHETERIZATION     Hx: of " around 1985 at [facility]"  . CYST EXCISION     Hx: of  . I & D EXTREMITY  03/13/2011   Procedure: IRRIGATION AND DEBRIDEMENT EXTREMITY;  Surgeon: [name], MD;  Location: [place];  Service: Orthopedics;  Laterality: Left;  . I & D EXTREMITY Left 11/06/2012   Procedure: IRRIGATION AND DEBRIDEMENT EXTREMITY-Left lower ;  Surgeon: [name],  MD;  Location: [place];  Service: Orthopedics;  Laterality: Left;  Left Leg Irrigation and Debridement, Place Skin Graft, Wound VAC, [place]  . skin grafts    . SKIN SPLIT GRAFT  03/13/2012   Procedure: SKIN GRAFT SPLIT THICKNESS;  Surgeon: [name], MD;  Location: [place];  Service: Orthopedics;  Laterality: Left;  Excisional Debridement Left Leg, Split Thickness Skin Graft, Wound VAC  . stomach stapling    . tummy tuck    . VEIN LIGATION AND STRIPPING     Past Surgical History:  Procedure Laterality Date  . AMPUTATION Left 11/25/2012   Procedure: AMPUTATION BELOW KNEE;  Surgeon: [name], MD;  Location: [place];  Service: Orthopedics;  Laterality: Left;  Left Below Knee Amputation  . APLIGRAFT PLACEMENT Left 07/31/2012   Procedure: Apply Skin Graft;  Surgeon: [name], MD;  Location: [place];  Service: Orthopedics;  Laterality: Left;  . APPLICATION OF WOUND VAC Left 07/31/2012   Procedure: APPLICATION OF WOUND VAC;  Surgeon: [name], MD;  Location: [place];  Service: Orthopedics;  Laterality: Left;  . arm surgery    . back stimulator    . CARDIAC CATHETERIZATION     Hx: of " around 1985 at [facility]"  . CYST EXCISION     Hx: of  . I & D EXTREMITY  03/13/2011   Procedure: IRRIGATION AND DEBRIDEMENT EXTREMITY;  Surgeon: [name], MD;  Location: [place];  Service: Orthopedics;  Laterality: Left;  . I & D EXTREMITY Left 11/06/2012   Procedure: IRRIGATION AND DEBRIDEMENT EXTREMITY-Left lower ;  Surgeon: [name], MD;  Location: [place];  Service: Orthopedics;  Laterality: Left;  Left Leg Irrigation and Debridement, Place Skin Graft, Wound VAC, [place]  . skin grafts    . SKIN SPLIT GRAFT  03/13/2012   Procedure: SKIN GRAFT SPLIT THICKNESS;  Surgeon: [name], MD;  Location: [place];  Service: Orthopedics;  Laterality: Left;  Excisional Debridement Left Leg, Split Thickness Skin Graft, Wound VAC  . stomach stapling    . tummy tuck    . VEIN LIGATION AND STRIPPING     Past Medical History:  Diagnosis Date  . Anemia   . Asthma   . Cancer ([place])    hx basal cell skin cancer  . CHF (congestive heart failure) ([place])    does not see a cardiologist  . COPD (chronic obstructive pulmonary disease) ([place])    3L continuous  . Diabetes mellitus    not on medication  . DVT (deep venous thrombosis) ([facility])    LLE DVT '80's  . GERD (gastroesophageal reflux disease)   . Headache(784.0)    sinus headaches  . Leg pain   . Peripheral neuropathy   . Peripheral vascular disease ([place])   . Psoriasis   . Shortness of breath   . Sleep apnea    uses bipap, sleep study done in [place] 2 years ago, dr [name]   There were no vitals taken for this visit.  Opioid Risk Score:   Fall Risk Score:  `1  Depression screen PHQ 2/9  Depression screen [facility] 2/9  08/20/2018 03/02/2018 05/21/2017 04/18/2017 12/30/2016 11/04/2016 01/04/2016  Decreased Interest 0 0 0 0 0 0 0  Down, Depressed, Hopeless 0 0 0 0 0 0 0  PHQ - 2 Score 0 0 0 0 0 0 0  Altered sleeping - - - - - - -  Tired, decreased energy - - - - - - -  Change in appetite - - - - - - -  Feeling bad or failure about yourself  - - - - - - -  Trouble concentrating - - - - - - -  Moving slowly or fidgety/restless - - - - - - -  Suicidal thoughts - - - - - - -  PHQ-9 Score - - - - - - -  Difficult doing work/chores - - - - - - -   Review of Systems  Musculoskeletal: Positive for back pain and gait problem.  Skin: Positive for wound.       Lower spine   All other systems reviewed and are negative.      Objective:   Physical Exam Vitals and nursing note reviewed.  Constitutional:      Appearance: Normal appearance. He is obese.  Cardiovascular:     Rate and Rhythm: Normal rate and regular rhythm.     Pulses: Normal pulses.     Heart sounds: Normal heart sounds.  Pulmonary:     Effort: Pulmonary effort is normal.     Breath sounds: Normal breath sounds.     Comments: Continuous Oxygen 3 liters nasal cannula  Musculoskeletal:     Cervical back: Normal range of motion and neck supple.     Comments: Normal Muscle Bulk and Muscle Testing Reveals:  Upper Extremities: Full ROM and Muscle Strength 5/5  Sacral Tenderness  Lower Extremities: Right: Full ROM and Muscle Strength 5/5 Left: BKA Arrived in wheelchair   Skin:    General: Skin is warm and dry.  Neurological:     Mental Status: He is alert and oriented to person, place, and time.  Psychiatric:        Mood and Affect: Mood normal.        Behavior: Behavior normal.           Assessment & Plan:  1.Left BKA/ [place] Pain :Continue current medication regimen:Continue current regimen until 04/04/2020,  Refilled:oxycodone 15mg  one tablet5 times a day as needed # 150.Second script sent for the following month. We will  continue the opioid monitoring program, this consists of regular clinic visits, examinations, urine drug screen, pill counts as well as use of [place] Controlled Substance Reporting system. A 12 month History has been reviewed on the [place] Controlled Substance Reporting System on 04/04/2020.Continue: Topamax50 mg one tablet at HS and Amitriptyline..  2. Chronic Bilateral Low Back Pain without Sciatica: Continue HEP as Tolerated. Continue to Monitor. 04/04/2020. 3.Chronic pain syndrome:Continue Current medication Regime.04/04/2020. 4. Hx of spinal stimulator. Continue to Monitor.04/04/2020. 5. COPD: On Continuous Oxygen Therapy @ 3 liters Nasal Cannula. Pulmonology Following.04/04/2020 6. Depression: Continue: Cymbalta.04/04/2020 7.. Sacral Decubitus Stage 3: He will be rescheduling his appointment with surgery. Continue adequate nutrition and weight shifting12/14/2021.  F/U in 2 months

## 2020-04-06 LAB — DRUG TOX MONITOR 1 W/CONF, ORAL FLD

## 2020-04-06 LAB — PAT ID TIQ DOC: Test Affected: 93975

## 2020-04-06 LAB — DRUG TOX ALC METAB W/CON, ORAL FLD

## 2020-05-22 ENCOUNTER — Telehealth: Payer: Self-pay | Admitting: Registered Nurse

## 2020-05-22 MED ORDER — OXYCODONE HCL 15 MG PO TABS: 15.0000 mg | ORAL_TABLET | Freq: Every day | ORAL | 0 refills | Status: DC | PRN

## 2020-05-22 NOTE — Telephone Encounter (Signed)
Placed a call to [facility]:  According to PMP Oxycodone was filled on 04/27/2020. Spoke with pharmacist, his 30th day will be on 05/26/2020. New prescription was sent and the other oxycodone prescription was discarded. Mr. Dishman aware via My-Chart

## 2020-06-07 ENCOUNTER — Encounter: Payer: Medicare Other | Admitting: Physical Medicine & Rehabilitation

## 2020-06-13 ENCOUNTER — Ambulatory Visit: Payer: Medicare Other | Admitting: Registered Nurse

## 2020-06-22 ENCOUNTER — Encounter: Payer: Medicare Other | Attending: Physical Medicine & Rehabilitation | Admitting: Registered Nurse

## 2020-06-22 ENCOUNTER — Encounter: Payer: Self-pay | Admitting: Registered Nurse

## 2020-06-22 ENCOUNTER — Other Ambulatory Visit: Payer: Self-pay

## 2020-06-22 VITALS — BP 118/66 | HR 86 | Temp 98.6°F | Ht 70.0 in

## 2020-06-22 DIAGNOSIS — G546 Phantom limb syndrome with pain: Secondary | ICD-10-CM

## 2020-06-22 DIAGNOSIS — Z5181 Encounter for therapeutic drug level monitoring: Secondary | ICD-10-CM

## 2020-06-22 DIAGNOSIS — M545 Low back pain, unspecified: Secondary | ICD-10-CM | POA: Diagnosis not present

## 2020-06-22 DIAGNOSIS — S88119S Complete traumatic amputation at level between knee and ankle, unspecified lower leg, sequela: Secondary | ICD-10-CM | POA: Diagnosis present

## 2020-06-22 DIAGNOSIS — G894 Chronic pain syndrome: Secondary | ICD-10-CM | POA: Diagnosis not present

## 2020-06-22 DIAGNOSIS — G8929 Other chronic pain: Secondary | ICD-10-CM | POA: Insufficient documentation

## 2020-06-22 DIAGNOSIS — Z79891 Long term (current) use of opiate analgesic: Secondary | ICD-10-CM | POA: Insufficient documentation

## 2020-06-22 MED ORDER — OXYCODONE HCL 15 MG PO TABS: 15.0000 mg | ORAL_TABLET | Freq: Every day | ORAL | 0 refills | Status: DC | PRN

## 2020-06-22 MED ORDER — TIZANIDINE HCL 4 MG PO TABS
4.0000 mg | ORAL_TABLET | Freq: Three times a day (TID) | ORAL | 3 refills | Status: DC | PRN
Start: 2020-06-22 — End: 2020-11-13

## 2020-06-22 NOTE — Progress Notes (Signed)
Subjective:    Patient ID: Jeremy [name], male    DOB: 07-14-1950, 70 y.o.   MRN: [id number]  HPI: Jeremy [name] is a 70 y.o. male who returns for follow up appointment for chronic pain and medication refill. He states his  pain is located in his lower back and left stump pain with tingling and burning. He rates his pain 4. His current exercise regime is  performing stretching exercises.  [name] Morphine equivalent is 112.50 MME.  Oral Swab was Performed today.   Pain Inventory Average Pain 7 Pain Right Now 4 My pain is intermittent, constant, sharp, stabbing and aching  In the last 24 hours, has pain interfered with the following? General activity 4 Relation with others 6 Enjoyment of life 6 What TIME of day is your pain at its worst? morning  and daytime Sleep (in general) Poor  Pain is worse with: inactivity Pain improves with: rest and medication Relief from Meds: 9  Family History  Problem Relation Age of Onset  . Heart disease Brother    Social History   Socioeconomic History  . Marital status: Married    Spouse name: Not on file  . Number of children: Not on file  . Years of education: Not on file  . Highest education level: Not on file  Occupational History  . Not on file  Tobacco Use  . Smoking status: Former Smoker    Packs/day: 0.50    Years: 6.00    Pack years: 3.00    Types: Cigarettes    Quit date: 12/03/1990    Years since quitting: 29.5  . Smokeless tobacco: Never Used  Vaping Use  . Vaping Use: Never used  Substance and Sexual Activity  . Alcohol use: Yes    Alcohol/week: 0.0 standard drinks    Comment: rare  . Drug use: Yes  . Sexual activity: Not Currently  Other Topics Concern  . Not on file  Social History Narrative  . Not on file   Social Determinants of Health   Financial Resource Strain: Not on file  Food Insecurity: Not on file  Transportation Needs: Not on file  Physical Activity: Not on file  Stress: Not on file  Social  Connections: Not on file   Past Surgical History:  Procedure Laterality Date  . AMPUTATION Left 11/25/2012   Procedure: AMPUTATION BELOW KNEE;  Surgeon: [name], MD;  Location: [place];  Service: Orthopedics;  Laterality: Left;  Left Below Knee Amputation  . APLIGRAFT PLACEMENT Left 07/31/2012   Procedure: Apply Skin Graft;  Surgeon: [name], MD;  Location: [place];  Service: Orthopedics;  Laterality: Left;  . APPLICATION OF WOUND VAC Left 07/31/2012   Procedure: APPLICATION OF WOUND VAC;  Surgeon: [name], MD;  Location: [place];  Service: Orthopedics;  Laterality: Left;  . arm surgery    . back stimulator    . CARDIAC CATHETERIZATION     Hx: of " around 1985 at [facility]"  . CYST EXCISION     Hx: of  . I & D EXTREMITY  03/13/2011   Procedure: IRRIGATION AND DEBRIDEMENT EXTREMITY;  Surgeon: [name], MD;  Location: [place];  Service: Orthopedics;  Laterality: Left;  . I & D EXTREMITY Left 11/06/2012   Procedure: IRRIGATION AND DEBRIDEMENT EXTREMITY-Left lower ;  Surgeon: [name], MD;  Location: [place];  Service: Orthopedics;  Laterality: Left;  Left Leg Irrigation and Debridement, Place Skin Graft, Wound  VAC, [place]  . skin grafts    . SKIN SPLIT GRAFT  03/13/2012   Procedure: SKIN GRAFT SPLIT THICKNESS;  Surgeon: [name], MD;  Location: [place];  Service: Orthopedics;  Laterality: Left;  Excisional Debridement Left Leg, Split Thickness Skin Graft, Wound VAC  . stomach stapling    . tummy tuck    . VEIN LIGATION AND STRIPPING     Past Surgical History:  Procedure Laterality Date  . AMPUTATION Left 11/25/2012   Procedure: AMPUTATION BELOW KNEE;  Surgeon: [name], MD;  Location: [place];  Service: Orthopedics;  Laterality: Left;  Left Below Knee Amputation  . APLIGRAFT PLACEMENT Left 07/31/2012   Procedure: Apply Skin Graft;  Surgeon: [name], MD;  Location: [place];  Service: Orthopedics;  Laterality: Left;  . APPLICATION OF WOUND VAC Left 07/31/2012   Procedure:  APPLICATION OF WOUND VAC;  Surgeon: [name], MD;  Location: [place];  Service: Orthopedics;  Laterality: Left;  . arm surgery    . back stimulator    . CARDIAC CATHETERIZATION     Hx: of " around 1985 at [facility]"  . CYST EXCISION     Hx: of  . I & D EXTREMITY  03/13/2011   Procedure: IRRIGATION AND DEBRIDEMENT EXTREMITY;  Surgeon: [name], MD;  Location: [place];  Service: Orthopedics;  Laterality: Left;  . I & D EXTREMITY Left 11/06/2012   Procedure: IRRIGATION AND DEBRIDEMENT EXTREMITY-Left lower ;  Surgeon: [name], MD;  Location: [place];  Service: Orthopedics;  Laterality: Left;  Left Leg Irrigation and Debridement, Place Skin Graft, Wound VAC, [place]  . skin grafts    . SKIN SPLIT GRAFT  03/13/2012   Procedure: SKIN GRAFT SPLIT THICKNESS;  Surgeon: [name], MD;  Location: [place];  Service: Orthopedics;  Laterality: Left;  Excisional Debridement Left Leg, Split Thickness Skin Graft, Wound VAC  . stomach stapling    . tummy tuck    . VEIN LIGATION AND STRIPPING     Past Medical History:  Diagnosis Date  . Anemia   . Asthma   . Cancer ([place])    hx basal cell skin cancer  . CHF (congestive heart failure) ([place])    does not see a cardiologist  . COPD (chronic obstructive pulmonary disease) ([place])    3L continuous  . Diabetes mellitus    not on medication  . DVT (deep venous thrombosis) ([facility])    LLE DVT '80's  . GERD (gastroesophageal reflux disease)   . Headache(784.0)    sinus headaches  . Leg pain   . Peripheral neuropathy   . Peripheral vascular disease ([place])   . Psoriasis   . Shortness of breath   . Sleep apnea    uses bipap, sleep study done in [place] 2 years ago, dr Jeremy [name]   BP 118/66   Pulse 86   Temp 98.6 F (37 C)   Ht 5\' 10"  (1.778 m)   SpO2 95%   BMI 36.82 kg/m   Opioid Risk Score:   Fall Risk Score:  `1  Depression screen PHQ 2/9  Depression screen Jeremy [name] 2/9 04/04/2020 08/20/2018 03/02/2018 05/21/2017 04/18/2017 12/30/2016  11/04/2016  Decreased Interest 0 0 0 0 0 0 0  Down, Depressed, Hopeless 0 0 0 0 0 0 0  PHQ - 2 Score 0 0 0 0 0 0 0  Altered sleeping - - - - - - -  Tired, decreased energy - - - - - - -  Change in appetite - - - - - - -  Feeling bad or failure about yourself  - - - - - - -  Trouble concentrating - - - - - - -  Moving slowly or fidgety/restless - - - - - - -  Suicidal thoughts - - - - - - -  PHQ-9 Score - - - - - - -  Difficult doing work/chores - - - - - - -   Review of Systems  Musculoskeletal: Positive for back pain and gait problem.       LEFT STUMP PAIN  All other systems reviewed and are negative.      Objective:   Physical Exam Vitals and nursing note reviewed.  Constitutional:      Appearance: Normal appearance.  Cardiovascular:     Rate and Rhythm: Normal rate and regular rhythm.     Pulses: Normal pulses.     Heart sounds: Normal heart sounds.  Pulmonary:     Effort: Pulmonary effort is normal.     Breath sounds: Normal breath sounds.  Musculoskeletal:     Cervical back: Normal range of motion and neck supple.     Comments: Normal Muscle Bulk and Muscle Testing Reveals:  Upper Extremities: Full ROM and Muscle Strength 5/5  No Lumbar Paraspinal Tenderness today.  Lower Extremities: Right: Full ROM and Muscle Strength 5/5 Left Lower Extremity: BKA Arrived in wheelchair  Skin:    General: Skin is warm and dry.  Neurological:     Mental Status: He is alert and oriented to person, place, and time.  Psychiatric:        Mood and Affect: Mood normal.        Behavior: Behavior normal.           Assessment & Plan:  1.Left BKA/ [place] Pain :Continue current medication regimen:Continue current regimen until03/06/2020,  Refilled:oxycodone 15mg  one tablet5 times a day as needed # 150.Second script sent for the following month. We will continue the opioid monitoring program, this consists of regular clinic visits, examinations, urine drug screen, pill counts  as well as use of [place] Controlled Substance Reporting system. A 12 month History has been reviewed on the [place] Controlled Substance Reporting Systemon 06/22/2020.Continue: Topamax50 mg one tablet at HS and Amitriptyline..  2. Chronic Bilateral Low Back Pain without Sciatica: Continue HEP as Tolerated. Continue to Monitor.06/22/2020. 3.Chronic pain syndrome:Continue Current medication Regime.06/22/2020. 4. Hx of spinal stimulator. Continue to Monitor.06/22/2020. 5. COPD: On Continuous Oxygen Therapy @ 3 liters Nasal Cannula. Pulmonology Following.06/22/2020 6. Depression: Continue: Cymbalta.06/22/2020 7.. Sacral Decubitus Stage 3: Continue adequate nutrition and weight shifting. Wound Care Following.06/22/2020.  F/U in 2 months

## 2020-06-27 LAB — DRUG TOX MONITOR 1 W/CONF, ORAL FLD
Amphetamines: NEGATIVE ng/mL (ref <10)
Barbiturates: NEGATIVE ng/mL (ref <10)
Benzodiazepines: NEGATIVE ng/mL (ref <0.50)
Buprenorphine: NEGATIVE ng/mL (ref <0.10)
Cocaine: NEGATIVE ng/mL (ref <5.0)
Codeine: NEGATIVE ng/mL (ref <2.5)
Dihydrocodeine: NEGATIVE ng/mL (ref <2.5)
Fentanyl: NEGATIVE ng/mL (ref <0.10)
Heroin Metabolite: NEGATIVE ng/mL (ref <1.0)
Hydrocodone: NEGATIVE ng/mL (ref <2.5)
Hydromorphone: NEGATIVE ng/mL (ref <2.5)
MARIJUANA: NEGATIVE ng/mL (ref <2.5)
MDMA: NEGATIVE ng/mL (ref <10)
Meprobamate: NEGATIVE ng/mL (ref <2.5)
Methadone: NEGATIVE ng/mL (ref <5.0)
Morphine: NEGATIVE ng/mL (ref <2.5)
Nicotine Metabolite: NEGATIVE ng/mL (ref <5.0)
Norhydrocodone: NEGATIVE ng/mL (ref <2.5)
Noroxycodone: >250 ng/mL — ABNORMAL HIGH (ref <2.5)
Opiates: POSITIVE ng/mL — AB (ref <2.5)
Oxycodone: >250 ng/mL — ABNORMAL HIGH (ref <2.5)
Phencyclidine: NEGATIVE ng/mL (ref <10)
Tapentadol: NEGATIVE ng/mL (ref <5.0)
Tramadol: NEGATIVE ng/mL (ref <5.0)
Zolpidem: NEGATIVE ng/mL (ref <5.0)

## 2020-06-27 LAB — DRUG TOX ALC METAB W/CON, ORAL FLD: Alcohol Metabolite: NEGATIVE ng/mL (ref <25)

## 2020-06-29 ENCOUNTER — Telehealth: Payer: Self-pay | Admitting: *Deleted

## 2020-06-29 NOTE — Telephone Encounter (Signed)
Oral swab drug screen was consistent for prescribed medications.  ?

## 2020-07-07 ENCOUNTER — Telehealth: Payer: Self-pay

## 2020-07-07 NOTE — Telephone Encounter (Signed)
Patient wife called stating that patient has been in hospital since Wednesday in ICU due to sepsis.

## 2020-08-14 ENCOUNTER — Telehealth: Payer: Self-pay | Admitting: Registered Nurse

## 2020-08-14 MED ORDER — OXYCODONE HCL 15 MG PO TABS: 15.0000 mg | ORAL_TABLET | Freq: Every day | ORAL | 0 refills | Status: DC | PRN

## 2020-08-14 NOTE — Telephone Encounter (Signed)
PMP was Reviewed. Oxycodone E-scribed Today. [name] appointment was changed due to illness. [name] is aware of the above via My-Chart Message.

## 2020-08-15 ENCOUNTER — Encounter: Payer: Medicare Other | Admitting: Registered Nurse

## 2020-08-28 ENCOUNTER — Other Ambulatory Visit: Payer: Self-pay

## 2020-08-28 ENCOUNTER — Encounter: Payer: Self-pay | Admitting: Registered Nurse

## 2020-08-28 ENCOUNTER — Encounter: Payer: Medicare Other | Attending: Physical Medicine & Rehabilitation | Admitting: Registered Nurse

## 2020-08-28 VITALS — BP 133/72 | HR 88 | Temp 98.5°F | Ht 70.0 in

## 2020-08-28 DIAGNOSIS — L89153 Pressure ulcer of sacral region, stage 3: Secondary | ICD-10-CM

## 2020-08-28 DIAGNOSIS — Z5181 Encounter for therapeutic drug level monitoring: Secondary | ICD-10-CM | POA: Diagnosis present

## 2020-08-28 DIAGNOSIS — S88119S Complete traumatic amputation at level between knee and ankle, unspecified lower leg, sequela: Secondary | ICD-10-CM | POA: Diagnosis not present

## 2020-08-28 DIAGNOSIS — G8929 Other chronic pain: Secondary | ICD-10-CM

## 2020-08-28 DIAGNOSIS — G894 Chronic pain syndrome: Secondary | ICD-10-CM

## 2020-08-28 DIAGNOSIS — M545 Low back pain, unspecified: Secondary | ICD-10-CM

## 2020-08-28 DIAGNOSIS — G546 Phantom limb syndrome with pain: Secondary | ICD-10-CM | POA: Diagnosis not present

## 2020-08-28 DIAGNOSIS — Z79891 Long term (current) use of opiate analgesic: Secondary | ICD-10-CM

## 2020-08-28 MED ORDER — OXYCODONE HCL 15 MG PO TABS: 15.0000 mg | ORAL_TABLET | Freq: Every day | ORAL | 0 refills | Status: DC | PRN

## 2020-08-28 NOTE — Progress Notes (Signed)
Subjective:    Patient ID: Jeremy [name], male    DOB: 06-04-1950, 70 y.o.   MRN: [id number]  HPI: Jeremy [name] is a 70 y.o. male who returns for follow up appointment for chronic pain and medication refill. He states his pain is located in his sacrum and  Left stump phantom pain. He rates his pain 6. His current exercise regime is  performing stretching exercises.  Jeremy [name] Morphine equivalent is 112.50 MME.  Last Oral Swab was Performed on 06/22/2020, it was consistent.   Pain Inventory Average Pain 8 Pain Right Now 6 My pain is constant, sharp, stabbing and aching  In the last 24 hours, has pain interfered with the following? General activity 7 Relation with others 6 Enjoyment of life 8 What TIME of day is your pain at its worst? morning  Sleep (in general) Poor  Pain is worse with: inactivity Pain improves with: medication Relief from Meds: 4  Family History  Problem Relation Age of Onset  . Heart disease Brother    Social History   Socioeconomic History  . Marital status: Married    Spouse name: Not on file  . Number of children: Not on file  . Years of education: Not on file  . Highest education level: Not on file  Occupational History  . Not on file  Tobacco Use  . Smoking status: Former Smoker    Packs/day: 0.50    Years: 6.00    Pack years: 3.00    Types: Cigarettes    Quit date: 12/03/1990    Years since quitting: 29.7  . Smokeless tobacco: Never Used  Vaping Use  . Vaping Use: Never used  Substance and Sexual Activity  . Alcohol use: Yes    Alcohol/week: 0.0 standard drinks    Comment: rare  . Drug use: Yes  . Sexual activity: Not Currently  Other Topics Concern  . Not on file  Social History Narrative  . Not on file   Social Determinants of Health   Financial Resource Strain: Not on file  Food Insecurity: Not on file  Transportation Needs: Not on file  Physical Activity: Not on file  Stress: Not on file  Social Connections: Not on file    Past Surgical History:  Procedure Laterality Date  . AMPUTATION Left 11/25/2012   Procedure: AMPUTATION BELOW KNEE;  Surgeon: [name], MD;  Location: [place];  Service: Orthopedics;  Laterality: Left;  Left Below Knee Amputation  . APLIGRAFT PLACEMENT Left 07/31/2012   Procedure: Apply Skin Graft;  Surgeon: [name], MD;  Location: [place];  Service: Orthopedics;  Laterality: Left;  . APPLICATION OF WOUND VAC Left 07/31/2012   Procedure: APPLICATION OF WOUND VAC;  Surgeon: [name], MD;  Location: [place];  Service: Orthopedics;  Laterality: Left;  . arm surgery    . back stimulator    . CARDIAC CATHETERIZATION     Hx: of " around 1985 at [facility]"  . CYST EXCISION     Hx: of  . I & D EXTREMITY  03/13/2011   Procedure: IRRIGATION AND DEBRIDEMENT EXTREMITY;  Surgeon: [name], MD;  Location: [place];  Service: Orthopedics;  Laterality: Left;  . I & D EXTREMITY Left 11/06/2012   Procedure: IRRIGATION AND DEBRIDEMENT EXTREMITY-Left lower ;  Surgeon: [name], MD;  Location: [place];  Service: Orthopedics;  Laterality: Left;  Left Leg Irrigation and Debridement, Place Skin Graft, Wound VAC, [place]  .  skin grafts    . SKIN SPLIT GRAFT  03/13/2012   Procedure: SKIN GRAFT SPLIT THICKNESS;  Surgeon: [name], MD;  Location: [place];  Service: Orthopedics;  Laterality: Left;  Excisional Debridement Left Leg, Split Thickness Skin Graft, Wound VAC  . stomach stapling    . tummy tuck    . VEIN LIGATION AND STRIPPING     Past Surgical History:  Procedure Laterality Date  . AMPUTATION Left 11/25/2012   Procedure: AMPUTATION BELOW KNEE;  Surgeon: [name], MD;  Location: [place];  Service: Orthopedics;  Laterality: Left;  Left Below Knee Amputation  . APLIGRAFT PLACEMENT Left 07/31/2012   Procedure: Apply Skin Graft;  Surgeon: [name], MD;  Location: [place];  Service: Orthopedics;  Laterality: Left;  . APPLICATION OF WOUND VAC Left 07/31/2012   Procedure: APPLICATION OF WOUND  VAC;  Surgeon: [name], MD;  Location: [place];  Service: Orthopedics;  Laterality: Left;  . arm surgery    . back stimulator    . CARDIAC CATHETERIZATION     Hx: of " around 1985 at [facility]"  . CYST EXCISION     Hx: of  . I & D EXTREMITY  03/13/2011   Procedure: IRRIGATION AND DEBRIDEMENT EXTREMITY;  Surgeon: [name], MD;  Location: [place];  Service: Orthopedics;  Laterality: Left;  . I & D EXTREMITY Left 11/06/2012   Procedure: IRRIGATION AND DEBRIDEMENT EXTREMITY-Left lower ;  Surgeon: [name], MD;  Location: [place];  Service: Orthopedics;  Laterality: Left;  Left Leg Irrigation and Debridement, Place Skin Graft, Wound VAC, [place]  . skin grafts    . SKIN SPLIT GRAFT  03/13/2012   Procedure: SKIN GRAFT SPLIT THICKNESS;  Surgeon: [name], MD;  Location: [place];  Service: Orthopedics;  Laterality: Left;  Excisional Debridement Left Leg, Split Thickness Skin Graft, Wound VAC  . stomach stapling    . tummy tuck    . VEIN LIGATION AND STRIPPING     Past Medical History:  Diagnosis Date  . Anemia   . Asthma   . Cancer ([facility])    hx basal cell skin cancer  . CHF (congestive heart failure) ([facility])    does not see a cardiologist  . COPD (chronic obstructive pulmonary disease) ([facility])    3L continuous  . Diabetes mellitus    not on medication  . DVT (deep venous thrombosis) ([facility])    LLE DVT '80's  . GERD (gastroesophageal reflux disease)   . Headache(784.0)    sinus headaches  . Leg pain   . Peripheral neuropathy   . Peripheral vascular disease ([place])   . Psoriasis   . Shortness of breath   . Sleep apnea    uses bipap, sleep study done in [place] 2 years ago, dr [name]   BP 133/72   Pulse 88   Temp 98.5 F (36.9 C)   Ht 5\' 10"  (1.778 m)   SpO2 96%   BMI 36.82 kg/m   Opioid Risk Score:   Fall Risk Score:  `1  Depression screen PHQ 2/9  Depression screen [facility] 2/9 06/22/2020 04/04/2020 08/20/2018 03/02/2018 05/21/2017 04/18/2017 12/30/2016  Decreased  Interest 0 0 0 0 0 0 0  Down, Depressed, Hopeless 0 0 0 0 0 0 0  PHQ - 2 Score 0 0 0 0 0 0 0  Altered sleeping - - - - - - -  Tired, decreased energy - - - - - - -  Change in appetite - - - - - - -  Feeling bad or failure about yourself  - - - - - - -  Trouble concentrating - - - - - - -  Moving slowly or fidgety/restless - - - - - - -  Suicidal thoughts - - - - - - -  PHQ-9 Score - - - - - - -  Difficult doing work/chores - - - - - - -   Review of Systems  Musculoskeletal: Positive for back pain and gait problem.       Left leg  All other systems reviewed and are negative.      Objective:   Physical Exam Vitals and nursing note reviewed.  Constitutional:      Appearance: Normal appearance.  Cardiovascular:     Rate and Rhythm: Normal rate and regular rhythm.     Pulses: Normal pulses.     Heart sounds: Normal heart sounds.  Pulmonary:     Effort: Pulmonary effort is normal.     Breath sounds: Normal breath sounds.  Musculoskeletal:     Cervical back: Normal range of motion and neck supple.     Comments: Normal Muscle Bulk and Muscle Testing Reveals:  Upper Extremities: Full ROM and Muscle Strength 5/5  Lower Extremities: Right: Full ROM and Muscle Strength 5/5 Left: BKA: Arrived in wheelchair   Skin:    General: Skin is warm and dry.  Neurological:     Mental Status: He is alert and oriented to person, place, and time.  Psychiatric:        Mood and Affect: Mood normal.        Behavior: Behavior normal.           Assessment & Plan:  1.Left BKA/ [place] Pain :Continue current medication regimen:Continue current regimen until05/12/2020,  Refilled:oxycodone 15mg  one tablet5 times a day as needed # 150.Second script sent for the following month. We will continue the opioid monitoring program, this consists of regular clinic visits, examinations, urine drug screen, pill counts as well as use of [place] Controlled Substance Reporting system. A 12 month  History has been reviewed on the [place] Controlled Substance Reporting Systemon 08/28/2020.Continue: Topamax50 mg one tablet at HS and Amitriptyline..  2. Chronic Bilateral Low Back Pain without Sciatica: Continue HEP as Tolerated. Continue to Monitor.08/28/2020. 3.Chronic pain syndrome:Continue Current medication Regime.08/28/2020. 4. Hx of spinal stimulator. Continue to Monitor.08/28/2020. 5. COPD: On Continuous Oxygen Therapy @ 3 liters Nasal Cannula. Pulmonology Following.08/28/2020 6. Depression: Continue: Cymbalta.08/28/2020 7.. Sacral Decubitus Stage 3:Continue adequate nutrition and weight shifting. Home Health  Following.08/28/2020.  F/U in 2 months

## 2020-08-30 ENCOUNTER — Encounter: Payer: Self-pay | Admitting: Registered Nurse

## 2020-10-11 ENCOUNTER — Other Ambulatory Visit: Payer: Self-pay | Admitting: Registered Nurse

## 2020-11-06 ENCOUNTER — Encounter: Payer: Medicare Other | Admitting: Registered Nurse

## 2020-11-13 ENCOUNTER — Other Ambulatory Visit: Payer: Self-pay

## 2020-11-13 ENCOUNTER — Encounter: Payer: Medicare Other | Attending: Physical Medicine & Rehabilitation | Admitting: Registered Nurse

## 2020-11-13 VITALS — BP 164/71 | HR 76 | Temp 98.8°F

## 2020-11-13 DIAGNOSIS — G546 Phantom limb syndrome with pain: Secondary | ICD-10-CM | POA: Diagnosis present

## 2020-11-13 DIAGNOSIS — S88119S Complete traumatic amputation at level between knee and ankle, unspecified lower leg, sequela: Secondary | ICD-10-CM | POA: Diagnosis present

## 2020-11-13 DIAGNOSIS — G894 Chronic pain syndrome: Secondary | ICD-10-CM | POA: Diagnosis present

## 2020-11-13 DIAGNOSIS — G8929 Other chronic pain: Secondary | ICD-10-CM | POA: Diagnosis present

## 2020-11-13 DIAGNOSIS — R1032 Left lower quadrant pain: Secondary | ICD-10-CM | POA: Diagnosis present

## 2020-11-13 DIAGNOSIS — L89153 Pressure ulcer of sacral region, stage 3: Secondary | ICD-10-CM | POA: Insufficient documentation

## 2020-11-13 DIAGNOSIS — Z5181 Encounter for therapeutic drug level monitoring: Secondary | ICD-10-CM | POA: Diagnosis present

## 2020-11-13 DIAGNOSIS — M545 Low back pain, unspecified: Secondary | ICD-10-CM | POA: Diagnosis present

## 2020-11-13 DIAGNOSIS — M62838 Other muscle spasm: Secondary | ICD-10-CM | POA: Insufficient documentation

## 2020-11-13 DIAGNOSIS — Z79891 Long term (current) use of opiate analgesic: Secondary | ICD-10-CM | POA: Diagnosis present

## 2020-11-13 MED ORDER — TIZANIDINE HCL 4 MG PO TABS: 4.0000 mg | ORAL_TABLET | Freq: Three times a day (TID) | ORAL | 3 refills | Status: DC | PRN

## 2020-11-13 MED ORDER — OXYCODONE HCL 15 MG PO TABS: 15.0000 mg | ORAL_TABLET | Freq: Every day | ORAL | 0 refills | Status: DC | PRN

## 2020-11-13 NOTE — Progress Notes (Signed)
Subjective:    Patient ID: Jeremy [name], male    DOB: [birth year], 70 y.o.   MRN: [id number]  HPI: Jeremy [name] is a 70 y.o. male who returns for follow up appointment for chronic pain and medication refill. He states his pain is located in his sacrum ( sacral ulcer). He rates his pain 7. His current exercise regime is performing stretching exercises.  Jeremy [name] Morphine equivalent is 112.50  MME.   Oral Swab was Performed today.    Pain Inventory Average Pain 7 Pain Right Now 7 My pain is intermittent, sharp, stabbing, and aching  In the last 24 hours, has pain interfered with the following? General activity 7 Relation with others 7 Enjoyment of life 8 What TIME of day is your pain at its worst? daytime Sleep (in general) Poor  Pain is worse with: sitting and inactivity Pain improves with: medication Relief from Meds: 5  Family History  Problem Relation Age of Onset   Heart disease Brother    Social History   Socioeconomic History   Marital status: Married    Spouse name: Not on file   Number of children: Not on file   Years of education: Not on file   Highest education level: Not on file  Occupational History   Not on file  Tobacco Use   Smoking status: Former    Packs/day: 0.50    Years: 6.00    Pack years: 3.00    Types: Cigarettes    Quit date: 12/03/1990    Years since quitting: 29.9   Smokeless tobacco: Never  Vaping Use   Vaping Use: Never used  Substance and Sexual Activity   Alcohol use: Yes    Alcohol/week: 0.0 standard drinks    Comment: rare   Drug use: Yes   Sexual activity: Not Currently  Other Topics Concern   Not on file  Social History Narrative   Not on file   Social Determinants of Health   Financial Resource Strain: Not on file  Food Insecurity: Not on file  Transportation Needs: Not on file  Physical Activity: Not on file  Stress: Not on file  Social Connections: Not on file   Past Surgical History:  Procedure Laterality Date    AMPUTATION Left 11/25/2012   Procedure: [place];  Surgeon: [name], MD;  Location: [place];  Service: Orthopedics;  Laterality: Left;  Left Below Knee Amputation   APLIGRAFT PLACEMENT Left 07/31/2012   Procedure: Apply Skin Graft;  Surgeon: [name], MD;  Location: [place];  Service: Orthopedics;  Laterality: Left;   APPLICATION OF WOUND VAC Left 07/31/2012   Procedure: APPLICATION OF WOUND VAC;  Surgeon: [name], MD;  Location: [place];  Service: Orthopedics;  Laterality: Left;   arm surgery     back stimulator     CARDIAC CATHETERIZATION     Hx: of " around 1985 at [facility]"   CYST EXCISION     Hx: of   I & D EXTREMITY  03/13/2011   Procedure: IRRIGATION AND DEBRIDEMENT EXTREMITY;  Surgeon: [name], MD;  Location: [place];  Service: Orthopedics;  Laterality: Left;   I & D EXTREMITY Left 11/06/2012   Procedure: IRRIGATION AND DEBRIDEMENT EXTREMITY-Left lower ;  Surgeon: [name], MD;  Location: [place];  Service: Orthopedics;  Laterality: Left;  Left Leg Irrigation and Debridement, Place Skin Graft, Wound VAC, [place]   skin grafts     SKIN  SPLIT GRAFT  03/13/2012   Procedure: SKIN GRAFT SPLIT THICKNESS;  Surgeon: [name], MD;  Location: [place];  Service: Orthopedics;  Laterality: Left;  Excisional Debridement Left Leg, Split Thickness Skin Graft, Wound VAC   stomach stapling     tummy tuck     VEIN LIGATION AND STRIPPING     Past Surgical History:  Procedure Laterality Date   AMPUTATION Left 11/25/2012   Procedure: AMPUTATION BELOW KNEE;  Surgeon: [name], MD;  Location: [place];  Service: Orthopedics;  Laterality: Left;  Left Below Knee Amputation   APLIGRAFT PLACEMENT Left 07/31/2012   Procedure: Apply Skin Graft;  Surgeon: [name], MD;  Location: [place];  Service: Orthopedics;  Laterality: Left;   APPLICATION OF WOUND VAC Left 07/31/2012   Procedure: APPLICATION OF WOUND VAC;  Surgeon: [name], MD;  Location: [place];  Service: Orthopedics;   Laterality: Left;   arm surgery     back stimulator     CARDIAC CATHETERIZATION     Hx: of " around 1985 at [facility]"   CYST EXCISION     Hx: of   I & D EXTREMITY  03/13/2011   Procedure: IRRIGATION AND DEBRIDEMENT EXTREMITY;  Surgeon: [name], MD;  Location: [place];  Service: Orthopedics;  Laterality: Left;   I & D EXTREMITY Left 11/06/2012   Procedure: IRRIGATION AND DEBRIDEMENT EXTREMITY-Left lower ;  Surgeon: [name], MD;  Location: [place];  Service: Orthopedics;  Laterality: Left;  Left Leg Irrigation and Debridement, Place Skin Graft, Wound VAC, [place]   skin grafts     SKIN SPLIT GRAFT  03/13/2012   Procedure: SKIN GRAFT SPLIT THICKNESS;  Surgeon: [name], MD;  Location: [place];  Service: Orthopedics;  Laterality: Left;  Excisional Debridement Left Leg, Split Thickness Skin Graft, Wound VAC   stomach stapling     tummy tuck     VEIN LIGATION AND STRIPPING     Past Medical History:  Diagnosis Date   Anemia    Asthma    Cancer ([facility] [place])    hx basal cell skin cancer   CHF (congestive heart failure) ([facility])    does not see a cardiologist   COPD (chronic obstructive pulmonary disease) ([place])    3L continuous   Diabetes mellitus    not on medication   DVT (deep venous thrombosis) ([facility])    LLE DVT '80's   GERD (gastroesophageal reflux disease)    Headache(784.0)    sinus headaches   Leg pain    Peripheral neuropathy    Peripheral vascular disease ([facility])    Psoriasis    Shortness of breath    Sleep apnea    uses bipap, sleep study done in [place] 2 years ago, dr [name]   BP (!) 164/71   Pulse 76   Temp 98.8 F (37.1 C) (Oral)   SpO2 95%   Opioid Risk Score:   Fall Risk Score:  `1  Depression screen PHQ 2/9  Depression screen [facility] 2/9 08/28/2020 06/22/2020 04/04/2020 08/20/2018 03/02/2018 05/21/2017 04/18/2017  Decreased Interest 0 0 0 0 0 0 0  Down, Depressed, Hopeless 0 0 0 0 0 0 0  PHQ - 2 Score 0 0 0 0 0 0 0  Altered sleeping - - - - - - -  Tired,  decreased energy - - - - - - -  Change in appetite - - - - - - -  Feeling bad or  failure about yourself  - - - - - - -  Trouble concentrating - - - - - - -  Moving slowly or fidgety/restless - - - - - - -  Suicidal thoughts - - - - - - -  PHQ-9 Score - - - - - - -  Difficult doing work/chores - - - - - - -     Review of Systems  Musculoskeletal:  Positive for back pain.       Leg pain      Objective:   Physical Exam Vitals and nursing note reviewed.  Constitutional:      Appearance: Normal appearance.  Cardiovascular:     Rate and Rhythm: Normal rate and regular rhythm.     Pulses: Normal pulses.     Heart sounds: Normal heart sounds.  Pulmonary:     Effort: Pulmonary effort is normal.     Breath sounds: Normal breath sounds.  Musculoskeletal:     Cervical back: Normal range of motion and neck supple.     Comments: Normal Muscle Bulk and Muscle Testing Reveals:  Upper Extremities: Full ROM and Muscle Strength 5/5 Lower Extremities: Right: Full ROM and Muscle Strength 5/5 Left Lower Extremity: Left BKA  Arrived in wheelchair    Skin:    General: Skin is warm and dry.  Neurological:     Mental Status: He is alert and oriented to person, place, and time.  Psychiatric:        Mood and Affect: Mood normal.        Behavior: Behavior normal.         Assessment & Plan:  1.Left BKA/ [place] Pain : Continue current medication regimen: Continue current regimen until 11/13/2020, Refilled: oxycodone 15 mg one tablet 5 times a day as needed # 150.  Second script sent for the following month. We will continue the opioid monitoring program, this consists of regular clinic visits, examinations, urine drug screen, pill counts as well as use of [place] Controlled Substance Reporting system. A 12 month History has been reviewed on the [place] Controlled Substance Reporting System on 11/13/2020.Continue: Topamax 50 mg one tablet at HS and Amitriptyline.. 2. Chronic  Bilateral Low Back Pain without Sciatica: Continue HEP as Tolerated. Continue to Monitor. 11/13/2020. 3.Chronic  pain syndrome: Continue Current medication Regime. 11/13/2020. 4. Hx of spinal stimulator. Continue to Monitor. 11/13/2020. 5. COPD: On Continuous Oxygen Therapy @ 3 liters Nasal Cannula. Pulmonology Following. 11/13/2020 6. Depression: Continue: Cymbalta. 11/13/2020 7.. Sacral Decubitus Stage 3:  Continue adequate nutrition and weight shifting . Home Health  Following.11/13/2020.    F/U in 2 months

## 2020-11-17 ENCOUNTER — Encounter: Payer: Self-pay | Admitting: Registered Nurse

## 2020-11-20 LAB — DRUG TOX MONITOR 1 W/CONF, ORAL FLD
Amphetamines: NEGATIVE ng/mL (ref <10)
Barbiturates: NEGATIVE ng/mL (ref <10)
Benzodiazepines: NEGATIVE ng/mL (ref <0.50)
Buprenorphine: NEGATIVE ng/mL (ref <0.10)
Cocaine: NEGATIVE ng/mL (ref <5.0)
Codeine: NEGATIVE ng/mL (ref <2.5)
Dihydrocodeine: NEGATIVE ng/mL (ref <2.5)
Fentanyl: NEGATIVE ng/mL (ref <0.10)
Heroin Metabolite: NEGATIVE ng/mL (ref <1.0)
Hydrocodone: NEGATIVE ng/mL (ref <2.5)
Hydromorphone: NEGATIVE ng/mL (ref <2.5)
MARIJUANA: NEGATIVE ng/mL (ref <2.5)
MDMA: NEGATIVE ng/mL (ref <10)
Meprobamate: NEGATIVE ng/mL (ref <2.5)
Methadone: NEGATIVE ng/mL (ref <5.0)
Methadone: NEGATIVE ng/mL (ref <5.0)
Morphine: NEGATIVE ng/mL (ref <2.5)
Nicotine Metabolite: NEGATIVE ng/mL (ref <5.0)
Norhydrocodone: NEGATIVE ng/mL (ref <2.5)
Noroxycodone: 25.6 ng/mL — ABNORMAL HIGH (ref <2.5)
Opiates: POSITIVE ng/mL — AB (ref <2.5)
Oxycodone: 18.8 ng/mL — ABNORMAL HIGH (ref <2.5)
Oxymorphone: NEGATIVE ng/mL (ref <2.5)
Phencyclidine: NEGATIVE ng/mL (ref <10)
Tapentadol: NEGATIVE ng/mL (ref <5.0)
Tramadol: NEGATIVE ng/mL (ref <5.0)
Zolpidem: NEGATIVE ng/mL (ref <5.0)

## 2020-11-20 LAB — DRUG TOX ALC METAB W/CON, ORAL FLD: Alcohol Metabolite: NEGATIVE ng/mL (ref <25)

## 2020-11-21 ENCOUNTER — Telehealth: Payer: Self-pay | Admitting: *Deleted

## 2020-11-21 NOTE — Telephone Encounter (Signed)
Oral swab drug screen was consistent for prescribed medications.  ?

## 2020-12-28 NOTE — Telephone Encounter (Signed)
error 

## 2021-01-08 ENCOUNTER — Other Ambulatory Visit: Payer: Self-pay | Admitting: Registered Nurse

## 2021-01-09 ENCOUNTER — Encounter: Payer: Self-pay | Admitting: Registered Nurse

## 2021-01-09 ENCOUNTER — Other Ambulatory Visit: Payer: Self-pay

## 2021-01-09 ENCOUNTER — Encounter: Payer: Medicare Other | Attending: Physical Medicine & Rehabilitation | Admitting: Registered Nurse

## 2021-01-09 VITALS — BP 163/68 | HR 78 | Temp 98.9°F

## 2021-01-09 DIAGNOSIS — S88119S Complete traumatic amputation at level between knee and ankle, unspecified lower leg, sequela: Secondary | ICD-10-CM | POA: Diagnosis not present

## 2021-01-09 DIAGNOSIS — G894 Chronic pain syndrome: Secondary | ICD-10-CM | POA: Diagnosis not present

## 2021-01-09 DIAGNOSIS — M545 Low back pain, unspecified: Secondary | ICD-10-CM | POA: Insufficient documentation

## 2021-01-09 DIAGNOSIS — Z5181 Encounter for therapeutic drug level monitoring: Secondary | ICD-10-CM | POA: Diagnosis not present

## 2021-01-09 DIAGNOSIS — G8929 Other chronic pain: Secondary | ICD-10-CM | POA: Insufficient documentation

## 2021-01-09 DIAGNOSIS — L89153 Pressure ulcer of sacral region, stage 3: Secondary | ICD-10-CM | POA: Insufficient documentation

## 2021-01-09 DIAGNOSIS — Z79891 Long term (current) use of opiate analgesic: Secondary | ICD-10-CM | POA: Diagnosis present

## 2021-01-09 MED ORDER — OXYCODONE HCL 15 MG PO TABS: 15.0000 mg | ORAL_TABLET | Freq: Every day | ORAL | 0 refills | Status: DC | PRN

## 2021-01-09 NOTE — Progress Notes (Signed)
Subjective:    Patient ID: Jeremy [name], male    DOB: 1951-02-09, 71 y.o.   MRN: [id number]  HPI: Jeremy [name] is a 70 y.o. male who returns for follow up appointment for chronic pain and medication refill. He states his  pain is located in his sacrum ( Sacrum decubitus). He rates his pain 4. His current exercise regime is performing stretching exercises.  Mr. [place] Morphine equivalent is 112.50 MME.   Last Oral Swab was Performed on 11/13/2020, it was consistent.     Pain Inventory Average Pain 7 Pain Right Now 4 My pain is constant, stabbing, and aching  In the last 24 hours, has pain interfered with the following? General activity 5 Relation with others 5 Enjoyment of life 5 What TIME of day is your pain at its worst? daytime Sleep (in general) Poor  Pain is worse with: inactivity Pain improves with: medication Relief from Meds: 5  Family History  Problem Relation Age of Onset   Heart disease Brother    Social History   Socioeconomic History   Marital status: Married    Spouse name: Not on file   Number of children: Not on file   Years of education: Not on file   Highest education level: Not on file  Occupational History   Not on file  Tobacco Use   Smoking status: Former    Packs/day: 0.50    Years: 6.00    Pack years: 3.00    Types: Cigarettes    Quit date: 12/03/1990    Years since quitting: 30.1   Smokeless tobacco: Never  Vaping Use   Vaping Use: Never used  Substance and Sexual Activity   Alcohol use: Yes    Alcohol/week: 0.0 standard drinks    Comment: rare   Drug use: Yes   Sexual activity: Not Currently  Other Topics Concern   Not on file  Social History Narrative   Not on file   Social Determinants of Health   Financial Resource Strain: Not on file  Food Insecurity: Not on file  Transportation Needs: Not on file  Physical Activity: Not on file  Stress: Not on file  Social Connections: Not on file   Past Surgical History:  Procedure  Laterality Date   AMPUTATION Left 11/25/2012   Procedure: [place];  Surgeon: [name], MD;  Location: [place];  Service: Orthopedics;  Laterality: Left;  Left Below Knee Amputation   APLIGRAFT PLACEMENT Left 07/31/2012   Procedure: Apply Skin Graft;  Surgeon: [name], MD;  Location: [place];  Service: Orthopedics;  Laterality: Left;   APPLICATION OF WOUND VAC Left 07/31/2012   Procedure: APPLICATION OF WOUND VAC;  Surgeon: [name], MD;  Location: [place];  Service: Orthopedics;  Laterality: Left;   arm surgery     back stimulator     CARDIAC CATHETERIZATION     Hx: of " around 1985 at [facility]"   CYST EXCISION     Hx: of   I & D EXTREMITY  03/13/2011   Procedure: IRRIGATION AND DEBRIDEMENT EXTREMITY;  Surgeon: [name], MD;  Location: [place];  Service: Orthopedics;  Laterality: Left;   I & D EXTREMITY Left 11/06/2012   Procedure: IRRIGATION AND DEBRIDEMENT EXTREMITY-Left lower ;  Surgeon: [name], MD;  Location: [place];  Service: Orthopedics;  Laterality: Left;  Left Leg Irrigation and Debridement, Place Skin Graft, Wound VAC, Theraskin   skin grafts  SKIN SPLIT GRAFT  03/13/2012   Procedure: SKIN GRAFT SPLIT THICKNESS;  Surgeon: [name], MD;  Location: [place];  Service: Orthopedics;  Laterality: Left;  Excisional Debridement Left Leg, Split Thickness Skin Graft, Wound VAC   stomach stapling     tummy tuck     VEIN LIGATION AND STRIPPING     Past Surgical History:  Procedure Laterality Date   AMPUTATION Left 11/25/2012   Procedure: AMPUTATION BELOW KNEE;  Surgeon: [name], MD;  Location: [place];  Service: Orthopedics;  Laterality: Left;  Left Below Knee Amputation   APLIGRAFT PLACEMENT Left 07/31/2012   Procedure: Apply Skin Graft;  Surgeon: [name], MD;  Location: [place];  Service: Orthopedics;  Laterality: Left;   APPLICATION OF WOUND VAC Left 07/31/2012   Procedure: APPLICATION OF WOUND VAC;  Surgeon: [name], MD;  Location: [place];   Service: Orthopedics;  Laterality: Left;   arm surgery     back stimulator     CARDIAC CATHETERIZATION     Hx: of " around 1985 at [facility]"   CYST EXCISION     Hx: of   I & D EXTREMITY  03/13/2011   Procedure: IRRIGATION AND DEBRIDEMENT EXTREMITY;  Surgeon: [name], MD;  Location: [place];  Service: Orthopedics;  Laterality: Left;   I & D EXTREMITY Left 11/06/2012   Procedure: IRRIGATION AND DEBRIDEMENT EXTREMITY-Left lower ;  Surgeon: [name], MD;  Location: [place];  Service: Orthopedics;  Laterality: Left;  Left Leg Irrigation and Debridement, Place Skin Graft, Wound VAC, Theraskin   skin grafts     SKIN SPLIT GRAFT  03/13/2012   Procedure: SKIN GRAFT SPLIT THICKNESS;  Surgeon: [name], MD;  Location: [place];  Service: Orthopedics;  Laterality: Left;  Excisional Debridement Left Leg, Split Thickness Skin Graft, Wound VAC   stomach stapling     tummy tuck     VEIN LIGATION AND STRIPPING     Past Medical History:  Diagnosis Date   Anemia    Asthma    Cancer ([place])    hx basal cell skin cancer   CHF (congestive heart failure) (HCC)    does not see a cardiologist   COPD (chronic obstructive pulmonary disease) ([place])    3L continuous   Diabetes mellitus    not on medication   DVT (deep venous thrombosis) (HCC)    LLE DVT '80's   GERD (gastroesophageal reflux disease)    Headache(784.0)    sinus headaches   Leg pain    Peripheral neuropathy    Peripheral vascular disease (HCC)    Psoriasis    Shortness of breath    Sleep apnea    uses bipap, sleep study done in [place] 2 years ago, dr [name]   There were no vitals taken for this visit.  Opioid Risk Score:   Fall Risk Score:  `1  Depression screen PHQ 2/9  Depression screen [facility] 2/9 08/28/2020 06/22/2020 04/04/2020 08/20/2018 03/02/2018 05/21/2017 04/18/2017  Decreased Interest 0 0 0 0 0 0 0  Down, Depressed, Hopeless 0 0 0 0 0 0 0  PHQ - 2 Score 0 0 0 0 0 0 0  Altered sleeping - - - - - - -  Tired, decreased  energy - - - - - - -  Change in appetite - - - - - - -  Feeling bad or failure about yourself  - - - - - - -  Trouble concentrating - - - - - - -  Moving slowly or fidgety/restless - - - - - - -  Suicidal thoughts - - - - - - -  PHQ-9 Score - - - - - - -  Difficult doing work/chores - - - - - - -     Review of Systems  Musculoskeletal:  Positive for back pain.       Foot pain  All other systems reviewed and are negative.     Objective:   Physical Exam Vitals and nursing note reviewed.  Constitutional:      Appearance: Normal appearance.  Cardiovascular:     Rate and Rhythm: Normal rate and regular rhythm.     Pulses: Normal pulses.     Heart sounds: Normal heart sounds.  Pulmonary:     Effort: Pulmonary effort is normal.     Breath sounds: Normal breath sounds.  Musculoskeletal:     Cervical back: Normal range of motion and neck supple.     Comments: Normal Muscle Bulk and Muscle Testing Reveals:  Upper Extremities: Full ROM and Muscle Strength  5/5 Lower Extremities: Right: Full ROM and Muscle Strength 5/5 Left Lower Extremity: Left BKA Arrived in wheelchair     Skin:    General: Skin is warm and dry.  Neurological:     Mental Status: He is alert and oriented to person, place, and time.  Psychiatric:        Mood and Affect: Mood normal.        Behavior: Behavior normal.         Assessment & Plan:  1.Left BKA/ [place] Pain : Continue current medication regimen: Continue current regimen until 01/09/2021, Refilled: oxycodone 15 mg one tablet 5 times a day as needed # 150.  Second script sent for the following month. We will continue the opioid monitoring program, this consists of regular clinic visits, examinations, urine drug screen, pill counts as well as use of [place] Controlled Substance Reporting system. A 12 month History has been reviewed on the [place] Controlled Substance Reporting System on 11/13/2020.Continue: Topamax 50 mg one tablet at HS  and Amitriptyline.. 2. Chronic Bilateral Low Back Pain without Sciatica: Continue HEP as Tolerated. Continue to Monitor. 01/09/2021. 3.Chronic  pain syndrome: Continue Current medication Regime. 01/09/2021. 4. Hx of spinal stimulator. Continue to Monitor. 01/09/2021. 5. COPD: On Continuous Oxygen Therapy @ 3 liters Nasal Cannula. Pulmonology Following. 01/09/2021 6. Depression: Continue: Cymbalta. 01/09/2021 7.. Sacral Decubitus Stage 3:  Continue adequate nutrition and weight shifting . Home Health  Following.01/09/2021.    F/U in 2 months

## 2021-02-21 ENCOUNTER — Encounter (HOSPITAL_BASED_OUTPATIENT_CLINIC_OR_DEPARTMENT_OTHER): Payer: Medicare Other | Admitting: Physician Assistant

## 2021-03-05 ENCOUNTER — Other Ambulatory Visit: Payer: Self-pay

## 2021-03-05 ENCOUNTER — Encounter: Payer: Self-pay | Admitting: Registered Nurse

## 2021-03-05 ENCOUNTER — Encounter: Payer: Medicare Other | Attending: Physical Medicine & Rehabilitation | Admitting: Registered Nurse

## 2021-03-05 VITALS — BP 128/61 | HR 93 | Temp 99.0°F | Ht 70.0 in

## 2021-03-05 DIAGNOSIS — M62838 Other muscle spasm: Secondary | ICD-10-CM

## 2021-03-05 DIAGNOSIS — Z79891 Long term (current) use of opiate analgesic: Secondary | ICD-10-CM | POA: Diagnosis present

## 2021-03-05 DIAGNOSIS — Z5181 Encounter for therapeutic drug level monitoring: Secondary | ICD-10-CM

## 2021-03-05 DIAGNOSIS — G546 Phantom limb syndrome with pain: Secondary | ICD-10-CM

## 2021-03-05 DIAGNOSIS — G894 Chronic pain syndrome: Secondary | ICD-10-CM | POA: Diagnosis not present

## 2021-03-05 DIAGNOSIS — M545 Low back pain, unspecified: Secondary | ICD-10-CM

## 2021-03-05 DIAGNOSIS — G8929 Other chronic pain: Secondary | ICD-10-CM

## 2021-03-05 DIAGNOSIS — L89153 Pressure ulcer of sacral region, stage 3: Secondary | ICD-10-CM | POA: Diagnosis present

## 2021-03-05 DIAGNOSIS — S88119S Complete traumatic amputation at level between knee and ankle, unspecified lower leg, sequela: Secondary | ICD-10-CM

## 2021-03-05 MED ORDER — OXYCODONE HCL 15 MG PO TABS: 15.0000 mg | ORAL_TABLET | Freq: Every day | ORAL | 0 refills | Status: DC | PRN

## 2021-03-05 MED ORDER — TIZANIDINE HCL 4 MG PO TABS: 4.0000 mg | ORAL_TABLET | Freq: Three times a day (TID) | ORAL | 3 refills | Status: DC | PRN

## 2021-03-05 NOTE — Progress Notes (Signed)
Subjective:    Patient ID: Jeremy [name], male    DOB: 14-Jul-1950, 70 y.o.   MRN: [id number]  HPI: Jeremy [name] is a 70 y.o. male who returns for follow up appointment for chronic pain and medication refill. He states his pain is located in his lower back occasionally, sacral pain ( decubitus) and left stump pain with numbness and tingling. He rates his pain 3. His current exercise regime is performing stretching exercises.  Jeremy [name] Morphine equivalent is 112.50 MME.   Last Oral Swab was Performed on 11/13/2020, it was consistent.    Pain Inventory Average Pain 7 Pain Right Now 3 My pain is constant, sharp, stabbing, and aching  In the last 24 hours, has pain interfered with the following? General activity 7 Relation with others 7 Enjoyment of life 7 What TIME of day is your pain at its worst? daytime Sleep (in general) Poor  Pain is worse with: inactivity Pain improves with: medication Relief from Meds:  fair      Family History  Problem Relation Age of Onset   Heart disease Brother    Social History   Socioeconomic History   Marital status: Married    Spouse name: Not on file   Number of children: Not on file   Years of education: Not on file   Highest education level: Not on file  Occupational History   Not on file  Tobacco Use   Smoking status: Former    Packs/day: 0.50    Years: 6.00    Pack years: 3.00    Types: Cigarettes    Quit date: 12/03/1990    Years since quitting: 30.2   Smokeless tobacco: Never  Vaping Use   Vaping Use: Never used  Substance and Sexual Activity   Alcohol use: Yes    Alcohol/week: 0.0 standard drinks    Comment: rare   Drug use: Yes   Sexual activity: Not Currently  Other Topics Concern   Not on file  Social History Narrative   Not on file   Social Determinants of Health   Financial Resource Strain: Not on file  Food Insecurity: Not on file  Transportation Needs: Not on file  Physical Activity: Not on file  Stress:  Not on file  Social Connections: Not on file   Past Surgical History:  Procedure Laterality Date   AMPUTATION Left 11/25/2012   Procedure: [place];  Surgeon: [name], MD;  Location: [place];  Service: Orthopedics;  Laterality: Left;  Left Below Knee Amputation   APLIGRAFT PLACEMENT Left 07/31/2012   Procedure: Apply Skin Graft;  Surgeon: [name], MD;  Location: [place];  Service: Orthopedics;  Laterality: Left;   APPLICATION OF WOUND VAC Left 07/31/2012   Procedure: APPLICATION OF WOUND VAC;  Surgeon: [name], MD;  Location: [place];  Service: Orthopedics;  Laterality: Left;   arm surgery     back stimulator     CARDIAC CATHETERIZATION     Hx: of " around 1985 at [facility]"   CYST EXCISION     Hx: of   I & D EXTREMITY  03/13/2011   Procedure: IRRIGATION AND DEBRIDEMENT EXTREMITY;  Surgeon: [name], MD;  Location: [place];  Service: Orthopedics;  Laterality: Left;   I & D EXTREMITY Left 11/06/2012   Procedure: IRRIGATION AND DEBRIDEMENT EXTREMITY-Left lower ;  Surgeon: [name], MD;  Location: [place];  Service: Orthopedics;  Laterality: Left;  Left Leg  Irrigation and Debridement, Place Skin Graft, Wound VAC, Theraskin   skin grafts     SKIN SPLIT GRAFT  03/13/2012   Procedure: SKIN GRAFT SPLIT THICKNESS;  Surgeon: [name], MD;  Location: [place];  Service: Orthopedics;  Laterality: Left;  Excisional Debridement Left Leg, Split Thickness Skin Graft, Wound VAC   stomach stapling     tummy tuck     VEIN LIGATION AND STRIPPING     Past Medical History:  Diagnosis Date   Anemia    Asthma    Cancer ([place])    hx basal cell skin cancer   CHF (congestive heart failure) (HCC)    does not see a cardiologist   COPD (chronic obstructive pulmonary disease) ([place])    3L continuous   Diabetes mellitus    not on medication   DVT (deep venous thrombosis) (HCC)    LLE DVT '80's   GERD (gastroesophageal reflux disease)    Headache(784.0)    sinus headaches   Leg pain     Peripheral neuropathy    Peripheral vascular disease (HCC)    Psoriasis    Shortness of breath    Sleep apnea    uses bipap, sleep study done in [place] 2 years ago, dr Jeremy [name]   BP 128/61   Pulse 93   Temp 99 F (37.2 C)   Ht 5\' 10"  (1.778 m)   SpO2 98% Comment: 3 liters O2  BMI 36.82 kg/m   Opioid Risk Score:   Fall Risk Score:  `1  Depression screen PHQ 2/9  Depression screen [facility] 2/9 08/28/2020 06/22/2020 04/04/2020 08/20/2018 03/02/2018 05/21/2017 04/18/2017  Decreased Interest 0 0 0 0 0 0 0  Down, Depressed, Hopeless 0 0 0 0 0 0 0  PHQ - 2 Score 0 0 0 0 0 0 0  Altered sleeping - - - - - - -  Tired, decreased energy - - - - - - -  Change in appetite - - - - - - -  Feeling bad or failure about yourself  - - - - - - -  Trouble concentrating - - - - - - -  Moving slowly or fidgety/restless - - - - - - -  Suicidal thoughts - - - - - - -  PHQ-9 Score - - - - - - -  Difficult doing work/chores - - - - - - -    Review of Systems  Musculoskeletal:  Positive for back pain.       Left stump  All other systems reviewed and are negative.     Objective:   Physical Exam Vitals and nursing note reviewed.  Constitutional:      Appearance: Normal appearance.  Cardiovascular:     Rate and Rhythm: Normal rate and regular rhythm.     Pulses: Normal pulses.     Heart sounds: Normal heart sounds.  Pulmonary:     Effort: Pulmonary effort is normal.     Breath sounds: Normal breath sounds.  Musculoskeletal:     Cervical back: Normal range of motion and neck supple.     Comments: Normal Muscle Bulk and Muscle Testing Reveals:  Upper Extremities: Full ROM and Muscle Strength 5/5 Lower Extremities: Right: Full ROM and Muscle Strength 5/5 Lower Extremities: Left AKA  Arrived in wheelchair     Skin:    General: Skin is warm and dry.  Neurological:     Mental Status: He is alert and oriented to person, place,  and time.         Assessment & Plan:  1.Left BKA/ [place]  Pain : Continue current medication regimen: Continue current regimen until 03/05/2021, Refilled: oxycodone 15 mg one tablet 5 times a day as needed # 150.  Second script sent for the following month. We will continue the opioid monitoring program, this consists of regular clinic visits, examinations, urine drug screen, pill counts as well as use of [place] Controlled Substance Reporting system. A 12 month History has been reviewed on the [place] Controlled Substance Reporting System on 03/05/2021.Continue: Topamax 50 mg one tablet at HS and Amitriptyline.. 2. Chronic Bilateral Low Back Pain without Sciatica: Continue HEP as Tolerated. Continue to Monitor. 03/05/2021. 3.Chronic  pain syndrome: Continue Current medication Regime. 03/05/2021. 4. Hx of spinal stimulator. Continue to Monitor. 03/05/2021. 5. COPD: On Continuous Oxygen Therapy @ 3 liters Nasal Cannula. Pulmonology Following. 03/05/2021 6. Depression: Continue: Cymbalta. Continue to monitor.  03/05/2021 7.. Sacral Decubitus Stage 3:  Continue adequate nutrition and weight shifting .Wound Care and ID Following. 03/05/2021.    F/U in 2 months

## 2021-03-12 ENCOUNTER — Ambulatory Visit: Payer: Medicare Other | Admitting: Registered Nurse

## 2021-05-01 ENCOUNTER — Ambulatory Visit: Payer: Medicare Other | Admitting: Registered Nurse

## 2021-05-04 ENCOUNTER — Encounter: Payer: Self-pay | Admitting: Registered Nurse

## 2021-05-04 ENCOUNTER — Other Ambulatory Visit: Payer: Self-pay

## 2021-05-04 ENCOUNTER — Encounter: Payer: Medicare Other | Attending: Physical Medicine & Rehabilitation | Admitting: Registered Nurse

## 2021-05-04 VITALS — BP 148/67 | HR 77 | Ht 70.0 in | Wt 233.6 lb

## 2021-05-04 DIAGNOSIS — Z5181 Encounter for therapeutic drug level monitoring: Secondary | ICD-10-CM | POA: Diagnosis present

## 2021-05-04 DIAGNOSIS — Z79891 Long term (current) use of opiate analgesic: Secondary | ICD-10-CM | POA: Diagnosis present

## 2021-05-04 DIAGNOSIS — M545 Low back pain, unspecified: Secondary | ICD-10-CM | POA: Insufficient documentation

## 2021-05-04 DIAGNOSIS — G546 Phantom limb syndrome with pain: Secondary | ICD-10-CM | POA: Insufficient documentation

## 2021-05-04 DIAGNOSIS — G8929 Other chronic pain: Secondary | ICD-10-CM | POA: Diagnosis present

## 2021-05-04 DIAGNOSIS — G894 Chronic pain syndrome: Secondary | ICD-10-CM | POA: Diagnosis present

## 2021-05-04 DIAGNOSIS — S88119S Complete traumatic amputation at level between knee and ankle, unspecified lower leg, sequela: Secondary | ICD-10-CM | POA: Diagnosis present

## 2021-05-04 MED ORDER — OXYCODONE HCL 15 MG PO TABS: 15.0000 mg | ORAL_TABLET | Freq: Every day | ORAL | 0 refills | Status: DC | PRN

## 2021-05-04 NOTE — Progress Notes (Signed)
Subjective:    Patient ID: Jeremy [name], male    DOB: [birth year], 72 y.o.   MRN: [id number]  HPI: Jeremy [name] is a 71 y.o. male who returns for follow up appointment for chronic pain and medication refill. He states his pain is located in his sacrum ( decubitus he reports) He  rates his pain 6. His current exercise regime is performing stretching exercises.  [name] equivalent is 112.50 MME.   Oral Swab was Performed today.    Pain Inventory Average Pain 8 Pain Right Now 6 My pain is sharp, stabbing, and aching  In the last 24 hours, has pain interfered with the following? General activity 7 Relation with others 6 Enjoyment of life 6 What TIME of day is your pain at its worst? daytime Sleep (in general) Poor  Pain is worse with: sitting Pain improves with: medication Relief from Meds: 2  Family History  Problem Relation Age of Onset   Heart disease Brother    Social History   Socioeconomic History   Marital status: Married    Spouse name: Not on file   Number of children: Not on file   Years of education: Not on file   Highest education level: Not on file  Occupational History   Not on file  Tobacco Use   Smoking status: Former    Packs/day: 0.50    Years: 6.00    Pack years: 3.00    Types: Cigarettes    Quit date: 12/03/1990    Years since quitting: 30.4   Smokeless tobacco: Never  Vaping Use   Vaping Use: Never used  Substance and Sexual Activity   Alcohol use: Yes    Alcohol/week: 0.0 standard drinks    Comment: rare   Drug use: Yes   Sexual activity: Not Currently  Other Topics Concern   Not on file  Social History Narrative   Not on file   Social Determinants of Health   Financial Resource Strain: Not on file  Food Insecurity: Not on file  Transportation Needs: Not on file  Physical Activity: Not on file  Stress: Not on file  Social Connections: Not on file   Past Surgical History:  Procedure Laterality Date   AMPUTATION Left  11/25/2012   Procedure: [place];  Surgeon: [name], MD;  Location: [place];  Service: Orthopedics;  Laterality: Left;  Left Below Knee Amputation   APLIGRAFT PLACEMENT Left 07/31/2012   Procedure: Apply Skin Graft;  Surgeon: [name], MD;  Location: [place];  Service: Orthopedics;  Laterality: Left;   APPLICATION OF WOUND VAC Left 07/31/2012   Procedure: APPLICATION OF WOUND VAC;  Surgeon: [name], MD;  Location: [place];  Service: Orthopedics;  Laterality: Left;   arm surgery     back stimulator     CARDIAC CATHETERIZATION     Hx: of " around 1985 at [facility]"   CYST EXCISION     Hx: of   I & D EXTREMITY  03/13/2011   Procedure: IRRIGATION AND DEBRIDEMENT EXTREMITY;  Surgeon: [name], MD;  Location: [place];  Service: Orthopedics;  Laterality: Left;   I & D EXTREMITY Left 11/06/2012   Procedure: IRRIGATION AND DEBRIDEMENT EXTREMITY-Left lower ;  Surgeon: [name], MD;  Location: [place];  Service: Orthopedics;  Laterality: Left;  Left Leg Irrigation and Debridement, Place Skin Graft, Wound VAC, Theraskin   skin grafts     SKIN SPLIT GRAFT  03/13/2012   Procedure: SKIN GRAFT SPLIT THICKNESS;  Surgeon: [name], MD;  Location: [place];  Service: Orthopedics;  Laterality: Left;  Excisional Debridement Left Leg, Split Thickness Skin Graft, Wound VAC   stomach stapling     tummy tuck     VEIN LIGATION AND STRIPPING     Past Surgical History:  Procedure Laterality Date   AMPUTATION Left 11/25/2012   Procedure: AMPUTATION BELOW KNEE;  Surgeon: [name], MD;  Location: [place];  Service: Orthopedics;  Laterality: Left;  Left Below Knee Amputation   APLIGRAFT PLACEMENT Left 07/31/2012   Procedure: Apply Skin Graft;  Surgeon: [name], MD;  Location: [place];  Service: Orthopedics;  Laterality: Left;   APPLICATION OF WOUND VAC Left 07/31/2012   Procedure: APPLICATION OF WOUND VAC;  Surgeon: [name], MD;  Location: [place];  Service: Orthopedics;  Laterality: Left;    arm surgery     back stimulator     CARDIAC CATHETERIZATION     Hx: of " around 1985 at [facility]"   CYST EXCISION     Hx: of   I & D EXTREMITY  03/13/2011   Procedure: IRRIGATION AND DEBRIDEMENT EXTREMITY;  Surgeon: [name], MD;  Location: [place];  Service: Orthopedics;  Laterality: Left;   I & D EXTREMITY Left 11/06/2012   Procedure: IRRIGATION AND DEBRIDEMENT EXTREMITY-Left lower ;  Surgeon: [name], MD;  Location: [place];  Service: Orthopedics;  Laterality: Left;  Left Leg Irrigation and Debridement, Place Skin Graft, Wound VAC, Theraskin   skin grafts     SKIN SPLIT GRAFT  03/13/2012   Procedure: SKIN GRAFT SPLIT THICKNESS;  Surgeon: [name], MD;  Location: [place];  Service: Orthopedics;  Laterality: Left;  Excisional Debridement Left Leg, Split Thickness Skin Graft, Wound VAC   stomach stapling     tummy tuck     VEIN LIGATION AND STRIPPING     Past Medical History:  Diagnosis Date   Anemia    Asthma    Cancer ([place])    hx basal cell skin cancer   CHF (congestive heart failure) (HCC)    does not see a cardiologist   COPD (chronic obstructive pulmonary disease) ([place])    3L continuous   Diabetes mellitus    not on medication   DVT (deep venous thrombosis) (HCC)    LLE DVT '80's   GERD (gastroesophageal reflux disease)    Headache(784.0)    sinus headaches   Leg pain    Peripheral neuropathy    Peripheral vascular disease (HCC)    Psoriasis    Shortness of breath    Sleep apnea    uses bipap, sleep study done in [place] 2 years ago, dr [name]   BP (!) 148/67    Pulse 77    Ht 5\' 10"  (1.778 m)    Wt 233 lb 9.6 oz (106 kg)    SpO2 97%    BMI 33.52 kg/m   Opioid Risk Score:   Fall Risk Score:  `1  Depression screen PHQ 2/9  Depression screen [facility] 2/9 03/05/2021 08/28/2020 06/22/2020 04/04/2020 08/20/2018 03/02/2018 05/21/2017  Decreased Interest 0 0 0 0 0 0 0  Down, Depressed, Hopeless 0 0 0 0 0 0 0  PHQ - 2 Score 0 0 0 0 0 0 0  Altered sleeping - - -  - - - -  Tired, decreased energy - - - - - - -  Change in appetite - - - - - - -  Feeling bad or failure about yourself  - - - - - - -  Trouble concentrating - - - - - - -  Moving slowly or fidgety/restless - - - - - - -  Suicidal thoughts - - - - - - -  PHQ-9 Score - - - - - - -  Difficult doing work/chores - - - - - - -      Review of Systems  Musculoskeletal:  Positive for back pain.       Left ankle pain  All other systems reviewed and are negative.     Objective:   Physical Exam Vitals and nursing note reviewed.  Constitutional:      Appearance: Normal appearance.  Cardiovascular:     Rate and Rhythm: Normal rate and regular rhythm.     Pulses: Normal pulses.     Heart sounds: Normal heart sounds.  Pulmonary:     Effort: Pulmonary effort is normal.     Breath sounds: Normal breath sounds.     Comments: Continuous Oxygen @ 3 Liters Nasal Cannula Musculoskeletal:     Cervical back: Normal range of motion and neck supple.     Comments: Normal Muscle Bulk and Muscle Testing Reveals:  Upper Extremities: Full ROM and Muscle Strength 5/5 Lower Extremities: Right: Full ROM and Muscle Strength 5/5 Left Lower Extremity: Left BKA:  Arrived in Wheelchair     Skin:    General: Skin is warm and dry.  Neurological:     Mental Status: He is alert and oriented to person, place, and time.  Psychiatric:        Mood and Affect: Mood normal.        Behavior: Behavior normal.         Assessment & Plan:  1.Left BKA/ [place] Pain : Continue current medication regimen: Continue current regimen until 05/04/2021, Refilled: oxycodone 15 mg one tablet 5 times a day as needed # 150.  Second script sent for the following month. We will continue the opioid monitoring program, this consists of regular clinic visits, examinations, urine drug screen, pill counts as well as use of [place] Controlled Substance Reporting system. A 12 month History has been reviewed on the [place]  Controlled Substance Reporting System on 05/04/2021.Continue: Topamax 50 mg one tablet at HS and Amitriptyline.. 2. Chronic Bilateral Low Back Pain without Sciatica: Continue HEP as Tolerated. Continue to Monitor. 05/04/2021. 3.Chronic  pain syndrome: Continue Current medication Regime. 05/04/2021. 4. Hx of spinal stimulator. Continue to Monitor. 05/04/2021. 5. COPD: On Continuous Oxygen Therapy @ 3 liters Nasal Cannula. Pulmonology Following. 05/04/2021 6. Depression: Continue: Cymbalta. Continue to monitor.  05/04/2021 7.. Sacral Decubitus Stage 3:  Continue adequate nutrition and weight shifting .Wound Care and ID Following. 05/04/2021.    F/U in 2 months

## 2021-05-07 ENCOUNTER — Ambulatory Visit: Payer: Medicare Other | Admitting: Registered Nurse

## 2021-05-10 ENCOUNTER — Ambulatory Visit: Payer: Medicare Other | Admitting: Registered Nurse

## 2021-05-12 LAB — DRUG TOX ALC METAB W/CON, ORAL FLD: Alcohol Metabolite: NEGATIVE ng/mL (ref <25)

## 2021-07-02 ENCOUNTER — Encounter: Payer: Medicare Other | Attending: Physical Medicine & Rehabilitation | Admitting: Registered Nurse

## 2021-07-02 ENCOUNTER — Telehealth: Payer: Self-pay | Admitting: Registered Nurse

## 2021-07-02 ENCOUNTER — Ambulatory Visit: Payer: Medicare Other | Admitting: Registered Nurse

## 2021-07-02 ENCOUNTER — Encounter: Payer: Self-pay | Admitting: Registered Nurse

## 2021-07-02 ENCOUNTER — Other Ambulatory Visit: Payer: Self-pay

## 2021-07-02 VITALS — BP 139/61 | HR 78 | Ht 70.0 in | Wt 233.0 lb

## 2021-07-02 DIAGNOSIS — Z79891 Long term (current) use of opiate analgesic: Secondary | ICD-10-CM | POA: Diagnosis present

## 2021-07-02 DIAGNOSIS — G8929 Other chronic pain: Secondary | ICD-10-CM | POA: Insufficient documentation

## 2021-07-02 DIAGNOSIS — S88119S Complete traumatic amputation at level between knee and ankle, unspecified lower leg, sequela: Secondary | ICD-10-CM | POA: Insufficient documentation

## 2021-07-02 DIAGNOSIS — M62838 Other muscle spasm: Secondary | ICD-10-CM | POA: Diagnosis present

## 2021-07-02 DIAGNOSIS — Z5181 Encounter for therapeutic drug level monitoring: Secondary | ICD-10-CM | POA: Diagnosis present

## 2021-07-02 DIAGNOSIS — M545 Low back pain, unspecified: Secondary | ICD-10-CM | POA: Diagnosis present

## 2021-07-02 DIAGNOSIS — G546 Phantom limb syndrome with pain: Secondary | ICD-10-CM | POA: Insufficient documentation

## 2021-07-02 DIAGNOSIS — L89153 Pressure ulcer of sacral region, stage 3: Secondary | ICD-10-CM | POA: Diagnosis present

## 2021-07-02 DIAGNOSIS — G894 Chronic pain syndrome: Secondary | ICD-10-CM | POA: Diagnosis present

## 2021-07-02 MED ORDER — AMITRIPTYLINE HCL 25 MG PO TABS: 25.0000 mg | ORAL_TABLET | Freq: Every day | ORAL | 2 refills | Status: DC

## 2021-07-02 MED ORDER — TIZANIDINE HCL 4 MG PO TABS: 4.0000 mg | ORAL_TABLET | Freq: Three times a day (TID) | ORAL | 3 refills | Status: DC | PRN

## 2021-07-02 MED ORDER — OXYCODONE HCL 15 MG PO TABS: 15.0000 mg | ORAL_TABLET | Freq: Every day | ORAL | 0 refills | Status: DC | PRN

## 2021-07-02 NOTE — Progress Notes (Signed)
Subjective:    Patient ID: Jeremy [name], male    DOB: [birth year], 71 y.o.   MRN: [id number]  HPI: Jeremy [name] is a 71 y.o. male who returns for follow up appointment for chronic pain and medication refill. He states his  pain is located in his lower back he also reports sacral pain ( decubitus ulcer). He rates his pain 5. His current exercise regime is performing stretching exercises.  Mr. Loveland states his spinal stimulator is not functioning anymore, he called the company. He's not interested having a new spinal stimulator placed at this time. He will speak with Dr Naaman Plummer at his next scheduled appointment.   Mr. Madeira Morphine equivalent is 112.50 MME.   Oral Swab was Performed today.     Pain Inventory Average Pain 7 Pain Right Now 5 My pain is sharp, stabbing, tingling, and aching  In the last 24 hours, has pain interfered with the following? General activity 7 Relation with others 5 Enjoyment of life 8 What TIME of day is your pain at its worst? morning  Sleep (in general) Fair  Pain is worse with: unsure Pain improves with: medication Relief from Meds: 5  Family History  Problem Relation Age of Onset   Heart disease Brother    Social History   Socioeconomic History   Marital status: Married    Spouse name: Not on file   Number of children: Not on file   Years of education: Not on file   Highest education level: Not on file  Occupational History   Not on file  Tobacco Use   Smoking status: Former    Packs/day: 0.50    Years: 6.00    Pack years: 3.00    Types: Cigarettes    Quit date: 12/03/1990    Years since quitting: 30.6   Smokeless tobacco: Never  Vaping Use   Vaping Use: Never used  Substance and Sexual Activity   Alcohol use: Yes    Alcohol/week: 0.0 standard drinks    Comment: rare   Drug use: Yes   Sexual activity: Not Currently  Other Topics Concern   Not on file  Social History Narrative   Not on file   Social Determinants of Health    Financial Resource Strain: Not on file  Food Insecurity: Not on file  Transportation Needs: Not on file  Physical Activity: Not on file  Stress: Not on file  Social Connections: Not on file   Past Surgical History:  Procedure Laterality Date   AMPUTATION Left 11/25/2012   Procedure: [place];  Surgeon: [name], MD;  Location: [place];  Service: Orthopedics;  Laterality: Left;  Left Below Knee Amputation   APLIGRAFT PLACEMENT Left 07/31/2012   Procedure: Apply Skin Graft;  Surgeon: [name], MD;  Location: [place];  Service: Orthopedics;  Laterality: Left;   APPLICATION OF WOUND VAC Left 07/31/2012   Procedure: APPLICATION OF WOUND VAC;  Surgeon: [name], MD;  Location: [place];  Service: Orthopedics;  Laterality: Left;   arm surgery     back stimulator     CARDIAC CATHETERIZATION     Hx: of " around 1985 at [facility]"   CYST EXCISION     Hx: of   I & D EXTREMITY  03/13/2011   Procedure: IRRIGATION AND DEBRIDEMENT EXTREMITY;  Surgeon: [name], MD;  Location: [place];  Service: Orthopedics;  Laterality: Left;   I & D EXTREMITY Left 11/06/2012  Procedure: IRRIGATION AND DEBRIDEMENT EXTREMITY-Left lower ;  Surgeon: [name], MD;  Location: [place];  Service: Orthopedics;  Laterality: Left;  Left Leg Irrigation and Debridement, Place Skin Graft, Wound VAC, [place]in   skin grafts     SKIN SPLIT GRAFT  03/13/2012   Procedure: SKIN GRAFT SPLIT THICKNESS;  Surgeon: [name], MD;  Location: [place];  Service: Orthopedics;  Laterality: Left;  Excisional Debridement Left Leg, Split Thickness Skin Graft, Wound VAC   stomach stapling     tummy tuck     VEIN LIGATION AND STRIPPING     Past Surgical History:  Procedure Laterality Date   AMPUTATION Left 11/25/2012   Procedure: AMPUTATION BELOW KNEE;  Surgeon: [name], MD;  Location: [place];  Service: Orthopedics;  Laterality: Left;  Left Below Knee Amputation   APLIGRAFT PLACEMENT Left 07/31/2012   Procedure: Apply  Skin Graft;  Surgeon: [name], MD;  Location: [place];  Service: Orthopedics;  Laterality: Left;   APPLICATION OF WOUND VAC Left 07/31/2012   Procedure: APPLICATION OF WOUND VAC;  Surgeon: [name], MD;  Location: [place];  Service: Orthopedics;  Laterality: Left;   arm surgery     back stimulator     CARDIAC CATHETERIZATION     Hx: of " around 1985 at [facility]"   CYST EXCISION     Hx: of   I & D EXTREMITY  03/13/2011   Procedure: IRRIGATION AND DEBRIDEMENT EXTREMITY;  Surgeon: [name], MD;  Location: [place];  Service: Orthopedics;  Laterality: Left;   I & D EXTREMITY Left 11/06/2012   Procedure: IRRIGATION AND DEBRIDEMENT EXTREMITY-Left lower ;  Surgeon: [name], MD;  Location: [place];  Service: Orthopedics;  Laterality: Left;  Left Leg Irrigation and Debridement, Place Skin Graft, Wound VAC, [place]askin   skin grafts     SKIN SPLIT GRAFT  03/13/2012   Procedure: SKIN GRAFT SPLIT THICKNESS;  Surgeon: [name], MD;  Location: [place];  Service: Orthopedics;  Laterality: Left;  Excisional Debridement Left Leg, Split Thickness Skin Graft, Wound VAC   stomach stapling     tummy tuck     VEIN LIGATION AND STRIPPING     Past Medical History:  Diagnosis Date   Anemia    Asthma    Cancer ([place])    hx basal cell skin cancer   CHF (congestive heart failure) (H[facility])    does not see a cardiologist   COPD (chronic obstructive pulmonary disease) ([place])    3L continuous   Diabetes mellitus    not on medication   DVT (deep venous thrombosis) (H[facility])    LLE DVT '80's   GERD (gastroesophageal reflux disease)    Headache(784.0)    sinus headaches   Leg pain    Peripheral neuropathy    Peripheral vascular disease (H[facility])    Psoriasis    Shortness of breath    Sleep apnea    uses bipap, sleep study done in [place] 2 years ago, dr [name]   BP 139/61    Pulse 78    Ht '5\' 10"'$  (1.778 m)    Wt 233 lb (105.7 kg)    SpO2 99%    BMI 33.43 kg/m   Opioid Risk Score:   Fall Risk Score:   `1  Depression screen PHQ 2/9  Depression screen [facility] 2/9 03/05/2021 08/28/2020 06/22/2020 04/04/2020 08/20/2018 03/02/2018 05/21/2017  Decreased Interest 0 0 0 0 0 0 0  Down, Depressed, Hopeless 0 0 0 0 0 0 0  PHQ - 2 Score 0 0 0 0 0 0 0  Altered sleeping - - - - - - -  Tired, decreased energy - - - - - - -  Change in appetite - - - - - - -  Feeling bad or failure about yourself  - - - - - - -  Trouble concentrating - - - - - - -  Moving slowly or fidgety/restless - - - - - - -  Suicidal thoughts - - - - - - -  PHQ-9 Score - - - - - - -  Difficult doing work/chores - - - - - - -     Review of Systems  Musculoskeletal:  Positive for back pain.  All other systems reviewed and are negative.     Objective:   Physical Exam Vitals and nursing note reviewed.  Constitutional:      Appearance: Normal appearance.  Cardiovascular:     Rate and Rhythm: Normal rate and regular rhythm.     Pulses: Normal pulses.     Heart sounds: Normal heart sounds.  Pulmonary:     Effort: Pulmonary effort is normal.     Breath sounds: Normal breath sounds.     Comments: Continuous Oxygen @ 3 Liters nasal cannula Musculoskeletal:     Cervical back: Normal range of motion and neck supple.     Comments: Normal Muscle Bulk and Muscle Testing Reveals:  Upper Extremities: Full ROM and Muscle Strength 5/5 Lower Extremities: Right: Full ROM and Muscle Strength 5/5  Left Lower Extremity: BKA Arrived in wheelchair     Skin:    General: Skin is warm and dry.  Neurological:     Mental Status: He is alert and oriented to person, place, and time.  Psychiatric:        Mood and Affect: Mood normal.        Behavior: Behavior normal.         Assessment & Plan:  1.Left BKA/ [place] Pain : Continue current medication regimen: Continue current regimen until 07/02/2021, Refilled: oxycodone 15 mg one tablet 5 times a day as needed # 150.  Second script sent for the following month. We will continue the opioid  monitoring program, this consists of regular clinic visits, examinations, urine drug screen, pill counts as well as use of [place] Controlled Substance Reporting system. A 12 month History has been reviewed on the [place] Controlled Substance Reporting System on 07/02/2021.Continue: Topamax 50 mg one tablet at HS and Amitriptyline.. 2. Chronic Bilateral Low Back Pain without Sciatica: Continue HEP as Tolerated. Continue to Monitor. 07/02/2021. 3.Chronic  pain syndrome: Continue Current medication Regime. 07/02/2021. 4. Hx of spinal stimulator. Not Functioning: Mr. Luu states he called the company, at this time he doesn't want to have a new stimulator placed at this time. Continue to Monitor. 07/02/2021. 5. COPD: On Continuous Oxygen Therapy @ 3 liters Nasal Cannula. Pulmonology Following. 07/02/2021 6. Depression: Continue: Cymbalta. Continue to monitor.  07/02/2021 7.. Sacral Decubitus Stage 3:  Continue adequate nutrition and weight shifting .Wound Care and ID Following. 07/02/2021.    F/U in 2 months

## 2021-07-05 NOTE — Telephone Encounter (Signed)
error 

## 2021-07-14 LAB — DRUG TOX MONITOR 1 W/CONF, ORAL FLD
Amphetamines: NEGATIVE ng/mL (ref <10)
Barbiturates: NEGATIVE ng/mL (ref <10)
Benzodiazepines: NEGATIVE ng/mL (ref <0.50)
Buprenorphine: NEGATIVE ng/mL (ref <0.10)
Cocaine: NEGATIVE ng/mL (ref <5.0)
Codeine: NEGATIVE ng/mL (ref <2.5)
Dihydrocodeine: 3.6 ng/mL — ABNORMAL HIGH (ref <2.5)
Fentanyl: NEGATIVE ng/mL (ref <0.10)
Heroin Metabolite: NEGATIVE ng/mL (ref <1.0)
Hydrocodone: 26.3 ng/mL — ABNORMAL HIGH (ref <2.5)
Hydromorphone: NEGATIVE ng/mL (ref <2.5)
MARIJUANA: NEGATIVE ng/mL (ref <2.5)
MDMA: NEGATIVE ng/mL (ref <10)
Meprobamate: NEGATIVE ng/mL (ref <2.5)
Methadone: NEGATIVE ng/mL (ref <5.0)
Morphine: NEGATIVE ng/mL (ref <2.5)
Naloxone: NEGATIVE ng/mL (ref <0.25)
Nicotine Metabolite: NEGATIVE ng/mL (ref <5.0)
Norbuprenorphine: NEGATIVE ng/mL (ref <0.50)
Norhydrocodone: 3 ng/mL — ABNORMAL HIGH (ref <2.5)
Noroxycodone: NEGATIVE ng/mL (ref <2.5)
Opiates: POSITIVE ng/mL — AB (ref <2.5)
Oxycodone: NEGATIVE ng/mL (ref <2.5)
Oxymorphone: NEGATIVE ng/mL (ref <2.5)
Phencyclidine: NEGATIVE ng/mL (ref <10)
Tapentadol: NEGATIVE ng/mL (ref <5.0)
Tramadol: NEGATIVE ng/mL (ref <5.0)
Zolpidem: NEGATIVE ng/mL (ref <5.0)

## 2021-07-14 LAB — DRUG TOX ALC METAB W/CON, ORAL FLD: Alcohol Metabolite: NEGATIVE ng/mL (ref <25)

## 2021-07-16 ENCOUNTER — Telehealth: Payer: Self-pay | Admitting: *Deleted

## 2021-07-16 ENCOUNTER — Telehealth: Payer: Self-pay | Admitting: Registered Nurse

## 2021-07-16 NOTE — Telephone Encounter (Signed)
Oral swab drug screen was positive for hydrocodone/metabolites and negative for oxycodone which he is being prescribed. His wife takes hydrocodone and he reports he may have accidentally taken one of her medications. A formal warning letter has been sent via [place]. ?

## 2021-07-16 NOTE — Telephone Encounter (Signed)
UDS Results was Reviewed.  ?+ Hydrocodone, Jeremy [name] is prescribed Oxycodone. His wife is prescribed Hydrocodone.  ?Jeremy [name] states "he doesn't believe he took his wife medication, and if he did it was an accident". He was educated on the narcotic policy, he is aware if this occurs again this can lead to being discharged from our office. He verbalizes understanding.  ?Jeremy [name] was instructed not to keep his and his wife medication together, to have different lock boxes, he verbalizes understanding. He also states he is compliant with his medication.  ? ? ?

## 2021-08-29 ENCOUNTER — Encounter: Payer: Medicare Other | Attending: Physical Medicine & Rehabilitation | Admitting: Physical Medicine & Rehabilitation

## 2021-08-29 ENCOUNTER — Encounter: Payer: Self-pay | Admitting: Physical Medicine & Rehabilitation

## 2021-08-29 VITALS — BP 171/73 | HR 67

## 2021-08-29 DIAGNOSIS — G546 Phantom limb syndrome with pain: Secondary | ICD-10-CM | POA: Diagnosis present

## 2021-08-29 DIAGNOSIS — G894 Chronic pain syndrome: Secondary | ICD-10-CM | POA: Insufficient documentation

## 2021-08-29 MED ORDER — BUPRENORPHINE 7.5 MCG/HR TD PTWK: 1.0000 | MEDICATED_PATCH | TRANSDERMAL | 0 refills | Status: AC

## 2021-08-29 MED ORDER — OXYCODONE HCL 15 MG PO TABS: 15.0000 mg | ORAL_TABLET | Freq: Every day | ORAL | 0 refills | Status: DC | PRN

## 2021-08-29 NOTE — Progress Notes (Signed)
? ?Subjective:  ? ? Patient ID: Jeremy [name], male    DOB: 04-30-1950, 71 y.o.   MRN: [id number] ? ?HPI ? ?Jeremy [name] is here in follow-up of his chronic pain. I last saw him 3 years [name]. He has had ongoing problems with a sacral wound for which he sees a wound clinic for. Currently the wound is still being packed daily. The wound is 2 x 1 x 3 cm with undermining areas per wound.  ? ?He reports gradual worsening of his pain since I last saw him.  His oxycodone has been increased to 15 mg 5 times a day as needed.  He says it is just not helping all that much anymore.  His average pain is around 8.  Several years [name] we had him on some long-acting opioids but there were concerns about respiratory suppression.  I believe at the time that there was some other things going on from a medical standpoint.  Nevertheless, we stopped the long-acting agents. ? ?Additionally for pain he is on tizanidine 4 mg 3 times daily as needed, Topamax 50 mg at bedtime and previously Celebrex by his other provider. ? ?Pain Inventory ?Average Pain 8 ?Pain Right Now 2 ?My pain is sharp, stabbing, and aching ? ?LOCATION OF PAIN  buttocks, leg ? ?BOWEL ?Number of stools per week: 4-5 ?Oral laxative use No  ?Type of laxative . ?Enema or suppository use No  ?History of colostomy No  ?Incontinent No  ? ?BLADDER ?Normal ?In and out cath, frequency . ?Able to self cath  . ?Bladder incontinence No  ?Frequent urination No  ?Leakage with coughing No  ?Difficulty starting stream No  ?Incomplete bladder emptying No  ? ? ?Mobility ?use a walker ?how many minutes can you walk? 1-3 ?ability to climb steps?  no ?do you drive?  yes ?use a wheelchair ? ?Function ?retired ? ?Neuro/Psych ?tingling ?trouble walking ? ?Prior Studies ?Any changes since last visit?  yes ? ?Physicians involved in your care ?Any changes since last visit?  yes ? ? ?Family History  ?Problem Relation Age of Onset  ? Heart disease Brother   ? ?Social History  ? ?Socioeconomic History  ?  Marital status: Married  ?  Spouse name: Not on file  ? Number of children: Not on file  ? Years of education: Not on file  ? Highest education level: Not on file  ?Occupational History  ? Not on file  ?Tobacco Use  ? Smoking status: Former  ?  Packs/day: 0.50  ?  Years: 6.00  ?  Pack years: 3.00  ?  Types: Cigarettes  ?  Quit date: 12/03/1990  ?  Years since quitting: 30.7  ? Smokeless tobacco: Never  ?Vaping Use  ? Vaping Use: Never used  ?Substance and Sexual Activity  ? Alcohol use: Yes  ?  Alcohol/week: 0.0 standard drinks  ?  Comment: rare  ? Drug use: Yes  ? Sexual activity: Not Currently  ?Other Topics Concern  ? Not on file  ?Social History Narrative  ? Not on file  ? ?Social Determinants of Health  ? ?Financial Resource Strain: Not on file  ?Food Insecurity: Not on file  ?Transportation Needs: Not on file  ?Physical Activity: Not on file  ?Stress: Not on file  ?Social Connections: Not on file  ? ?Past Surgical History:  ?Procedure Laterality Date  ? AMPUTATION Left 11/25/2012  ? Procedure: AMPUTATION BELOW KNEE;  Surgeon: [name], MD;  Location: [place];  Service: Orthopedics;  Laterality: Left;  Left Below Knee Amputation  ? APLIGRAFT PLACEMENT Left 07/31/2012  ? Procedure: Apply Skin Graft;  Surgeon: [name], MD;  Location: [place];  Service: Orthopedics;  Laterality: Left;  ? APPLICATION OF WOUND VAC Left 07/31/2012  ? Procedure: APPLICATION OF WOUND VAC;  Surgeon: [name], MD;  Location: [place];  Service: Orthopedics;  Laterality: Left;  ? arm surgery    ? back stimulator    ? CARDIAC CATHETERIZATION    ? Hx: of " around 1985 at [name]"  ? CYST EXCISION    ? Hx: of  ? I & D EXTREMITY  03/13/2011  ? Procedure: IRRIGATION AND DEBRIDEMENT EXTREMITY;  Surgeon: [name], MD;  Location: [name];  Service: Orthopedics;  Laterality: Left;  ? I & D EXTREMITY Left 11/06/2012  ? Procedure: IRRIGATION AND DEBRIDEMENT EXTREMITY-Left lower ;  Surgeon: [name], MD;  Location: [place];  Service:  Orthopedics;  Laterality: Left;  Left Leg Irrigation and Debridement, Place Skin Graft, Wound VAC, Theraskin  ? skin grafts    ? SKIN SPLIT GRAFT  03/13/2012  ? Procedure: SKIN GRAFT SPLIT THICKNESS;  Surgeon: [name], MD;  Location: [place];  Service: Orthopedics;  Laterality: Left;  Excisional Debridement Left Leg, Split Thickness Skin Graft, Wound VAC  ? stomach stapling    ? tummy tuck    ? VEIN LIGATION AND STRIPPING    ? ?Past Medical History:  ?Diagnosis Date  ? Anemia   ? Asthma   ? Cancer [name])   ? hx basal cell skin cancer  ? CHF (congestive heart failure) ([name])   ? does not see a cardiologist  ? COPD (chronic obstructive pulmonary disease) ([name])   ? 3L continuous  ? Diabetes mellitus   ? not on medication  ? DVT (deep venous thrombosis) ([name])   ? LLE DVT '80's  ? GERD (gastroesophageal reflux disease)   ? Headache(784.0)   ? sinus headaches  ? Leg pain   ? Peripheral neuropathy   ? Peripheral vascular disease ([name])   ? Psoriasis   ? Shortness of breath   ? Sleep apnea   ? uses bipap, sleep study done in [place] [name], dr [facility] [name]  ? ?BP (!) 171/73   Pulse 67   SpO2 98%  ? ?Opioid Risk Score:   ?Fall Risk Score:  `1 ? ?Depression screen PHQ 2/9 ? ? ?  03/05/2021  ? 12:57 PM 08/28/2020  ?  1:21 PM 06/22/2020  ? 12:44 PM 04/04/2020  ?  1:03 PM 08/20/2018  ? 10:59 AM 03/02/2018  ?  2:35 PM 05/21/2017  ? 11:57 AM  ?Depression screen PHQ 2/9  ?Decreased Interest 0 0 0 0 0 0 0  ?Down, Depressed, Hopeless 0 0 0 0 0 0 0  ?PHQ - 2 Score 0 0 0 0 0 0 0  ?  ? ?Review of Systems  ?Musculoskeletal:   ?     Buttocks pain ?Leg pain  ?All other systems reviewed and are negative. ? ?   ?Objective:  ? Physical Exam ? ?General: No acute distress.  Obese, in wheelchair ?HEENT: NCAT, EOMI, oral membranes moist ?Cards: reg rate  ?Chest: normal effort, wearing oxygen via nasal cannula ?Abdomen: Soft, NT, ND ?Skin: dry, intact.  Sacral wound was not visualized ?Extremities: no edema ?Psych: pleasant and appropriate  ?  Musculoskeletal: 1+ edema in LE's. .left  BK still contracted and it  movable.  ?Neurological: He is alert and oriented to person, place, and time. Decreased LT distally.  ?   ?   ?  ?  ?Assessment & Plan:  ?1. Left BKA/ [place] Pain: ?-Jakorey complains of increasing pain over the last few years and that his oxycodone really is not helping much more for his pain.  We discussed the concept of opioid tolerance as well as opioid pain hypersensitivity today. ?-Oxycodone 15 mg 1 every 4-6 hours prn. Decrease to #120 per month ?-Begin trial of butrans patch 7.'5mg'$  weekly, #4 written ?-Observe closely for tolerance.  I discussed with his wife as well. ?We will continue the controlled substance monitoring program, this consists of regular clinic visits, examinations, routine drug screening, pill counts as well as use of [place] Controlled Substance Reporting System. [place] today.   ?We will continue the controlled substance monitoring program, this consists of regular clinic visits, examinations, routine drug screening, pill counts as well as use of [place] Controlled Substance Reporting System. [place] was reviewed today.  ?. ?2. 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 Cymbalta, topamax ?3. Hx of spinal stimulator   ?4. COPD: On Continuous Oxygen Therapy @ 3 liters Nasal Cannula per Pulmonology---no changes  ?5. Reactive depression:  cymbalta  '30mg'$   ?6. Hx of kidney stones ?7. Chronic Pressure sores: wound per  wound clinic    ? -discussed nutrition, oxygenation, pressure, etc ?  ?30  minutes of face to face patient care time was spent during this visit. All questions were encouraged and answered.  Follow-up with [place] here in clinic with nurse practitioner in about a month. ? ? ? ?    ? ? ?

## 2021-08-29 NOTE — Patient Instructions (Signed)
PLEASE FEEL FREE TO CALL OUR OFFICE WITH ANY PROBLEMS OR QUESTIONS ([phone]) ? ? ?                ? ?WORK ON STRETCHING OUT YOUR DOSES OF OXYCODONE ?

## 2021-09-20 ENCOUNTER — Telehealth: Payer: Self-pay | Admitting: Registered Nurse

## 2021-09-20 ENCOUNTER — Telehealth: Payer: Self-pay | Admitting: *Deleted

## 2021-09-20 MED ORDER — BUPRENORPHINE 7.5 MCG/HR TD PTWK: 1.0000 | MEDICATED_PATCH | TRANSDERMAL | 1 refills | Status: DC

## 2021-09-20 MED ORDER — OXYCODONE HCL 15 MG PO TABS: 15.0000 mg | ORAL_TABLET | Freq: Four times a day (QID) | ORAL | 0 refills | Status: DC | PRN

## 2021-09-20 NOTE — Telephone Encounter (Signed)
Placed a call to Jeremy [name], no answer.  Will try again.  Sent My Chart Message to his wife to have Jeremy [name] call office

## 2021-09-20 NOTE — Telephone Encounter (Signed)
PMP was Reviewed.  He's tolerating the Butrans and Oxycodone.  Oxycodone and Butrans e-scribe d today, he verbalizes understanding.

## 2021-09-20 NOTE — Telephone Encounter (Signed)
Mr Jeremy [name] appt was changed to 6/14 and he will be out of his pain meds on Friday.  He is requesting refills be sent in. PMP shows Oxy was filled 08/30/21 and butrans 08/29/21

## 2021-09-26 ENCOUNTER — Ambulatory Visit: Payer: Medicare Other | Admitting: Registered Nurse

## 2021-10-03 ENCOUNTER — Encounter: Payer: Self-pay | Admitting: Registered Nurse

## 2021-10-03 ENCOUNTER — Encounter: Payer: Medicare Other | Attending: Physical Medicine & Rehabilitation | Admitting: Registered Nurse

## 2021-10-03 VITALS — BP 127/71 | HR 82 | Temp 98.2°F | Ht 70.0 in

## 2021-10-03 DIAGNOSIS — G546 Phantom limb syndrome with pain: Secondary | ICD-10-CM | POA: Diagnosis present

## 2021-10-03 DIAGNOSIS — Z5181 Encounter for therapeutic drug level monitoring: Secondary | ICD-10-CM | POA: Diagnosis present

## 2021-10-03 DIAGNOSIS — G8929 Other chronic pain: Secondary | ICD-10-CM | POA: Insufficient documentation

## 2021-10-03 DIAGNOSIS — L89153 Pressure ulcer of sacral region, stage 3: Secondary | ICD-10-CM | POA: Diagnosis present

## 2021-10-03 DIAGNOSIS — M545 Low back pain, unspecified: Secondary | ICD-10-CM | POA: Diagnosis present

## 2021-10-03 DIAGNOSIS — G894 Chronic pain syndrome: Secondary | ICD-10-CM

## 2021-10-03 DIAGNOSIS — M62838 Other muscle spasm: Secondary | ICD-10-CM

## 2021-10-03 DIAGNOSIS — Z79891 Long term (current) use of opiate analgesic: Secondary | ICD-10-CM | POA: Diagnosis present

## 2021-10-03 MED ORDER — TIZANIDINE HCL 4 MG PO TABS: 4.0000 mg | ORAL_TABLET | Freq: Three times a day (TID) | ORAL | 3 refills | Status: DC | PRN

## 2021-10-03 MED ORDER — BUPRENORPHINE 7.5 MCG/HR TD PTWK: 1.0000 | MEDICATED_PATCH | TRANSDERMAL | 1 refills | Status: DC

## 2021-10-03 MED ORDER — OXYCODONE HCL 15 MG PO TABS: 15.0000 mg | ORAL_TABLET | Freq: Four times a day (QID) | ORAL | 0 refills | Status: DC | PRN

## 2021-10-03 NOTE — Progress Notes (Signed)
Subjective:    Patient ID: Jeremy [name], male    DOB: 06-Nov-1950, 72 y.o.   MRN: [id number]  HPI: Jeremy [name] is a 71 y.o. male who returns for follow up appointment for chronic pain and medication refill. He states his pain is located in his lower back and sacral decubitus wound ( wound care  and home health following. He  rates his pain 4. His current exercise regime is  performing stretching exercises.  Jeremy [name] Morphine equivalent is 90.00 MME.   Oral Swab was Performed Today.     Pain Inventory Average Pain 8 Pain Right Now 4 My pain is sharp, stabbing, and aching  In the last 24 hours, has pain interfered with the following? General activity 6 Relation with others 5 Enjoyment of life 8 What TIME of day is your pain at its worst? evening Sleep (in general) Poor  Pain is worse with: inactivity Pain improves with: medication Relief from Meds: 2  Family History  Problem Relation Age of Onset   Heart disease Brother    Social History   Socioeconomic History   Marital status: Married    Spouse name: Not on file   Number of children: Not on file   Years of education: Not on file   Highest education level: Not on file  Occupational History   Not on file  Tobacco Use   Smoking status: Former    Packs/day: 0.50    Years: 6.00    Total pack years: 3.00    Types: Cigarettes    Quit date: 12/03/1990    Years since quitting: 30.8   Smokeless tobacco: Never  Vaping Use   Vaping Use: Never used  Substance and Sexual Activity   Alcohol use: Yes    Alcohol/week: 0.0 standard drinks of alcohol    Comment: rare   Drug use: Yes   Sexual activity: Not Currently  Other Topics Concern   Not on file  Social History Narrative   Not on file   Social Determinants of Health   Financial Resource Strain: Not on file  Food Insecurity: Not on file  Transportation Needs: Not on file  Physical Activity: Not on file  Stress: Not on file  Social Connections: Not on file   Past  Surgical History:  Procedure Laterality Date   AMPUTATION Left 11/25/2012   Procedure: [place];  Surgeon: [name], MD;  Location: [place];  Service: Orthopedics;  Laterality: Left;  Left Below Knee Amputation   APLIGRAFT PLACEMENT Left 07/31/2012   Procedure: Apply Skin Graft;  Surgeon: [name], MD;  Location: [place];  Service: Orthopedics;  Laterality: Left;   APPLICATION OF WOUND VAC Left 07/31/2012   Procedure: APPLICATION OF WOUND VAC;  Surgeon: [name], MD;  Location: [place];  Service: Orthopedics;  Laterality: Left;   arm surgery     back stimulator     CARDIAC CATHETERIZATION     Hx: of " around 1985 at [facility]"   CYST EXCISION     Hx: of   I & D EXTREMITY  03/13/2011   Procedure: IRRIGATION AND DEBRIDEMENT EXTREMITY;  Surgeon: [name], MD;  Location: [place];  Service: Orthopedics;  Laterality: Left;   I & D EXTREMITY Left 11/06/2012   Procedure: IRRIGATION AND DEBRIDEMENT EXTREMITY-Left lower ;  Surgeon: [name], MD;  Location: [place];  Service: Orthopedics;  Laterality: Left;  Left Leg Irrigation and Debridement, Place Skin Graft,  Wound VAC, [place]   skin grafts     SKIN SPLIT GRAFT  03/13/2012   Procedure: SKIN GRAFT SPLIT THICKNESS;  Surgeon: [name], MD;  Location: [place];  Service: Orthopedics;  Laterality: Left;  Excisional Debridement Left Leg, Split Thickness Skin Graft, Wound VAC   stomach stapling     tummy tuck     VEIN LIGATION AND STRIPPING     Past Surgical History:  Procedure Laterality Date   AMPUTATION Left 11/25/2012   Procedure: AMPUTATION BELOW KNEE;  Surgeon: [name], MD;  Location: [place];  Service: Orthopedics;  Laterality: Left;  Left Below Knee Amputation   APLIGRAFT PLACEMENT Left 07/31/2012   Procedure: Apply Skin Graft;  Surgeon: [name], MD;  Location: [place];  Service: Orthopedics;  Laterality: Left;   APPLICATION OF WOUND VAC Left 07/31/2012   Procedure: APPLICATION OF WOUND VAC;  Surgeon: [name],  MD;  Location: [place];  Service: Orthopedics;  Laterality: Left;   arm surgery     back stimulator     CARDIAC CATHETERIZATION     Hx: of " around 1985 at [facility]"   CYST EXCISION     Hx: of   I & D EXTREMITY  03/13/2011   Procedure: IRRIGATION AND DEBRIDEMENT EXTREMITY;  Surgeon: [name], MD;  Location: [place];  Service: Orthopedics;  Laterality: Left;   I & D EXTREMITY Left 11/06/2012   Procedure: IRRIGATION AND DEBRIDEMENT EXTREMITY-Left lower ;  Surgeon: [name], MD;  Location: [place];  Service: Orthopedics;  Laterality: Left;  Left Leg Irrigation and Debridement, Place Skin Graft, Wound VAC, [place]   skin grafts     SKIN SPLIT GRAFT  03/13/2012   Procedure: SKIN GRAFT SPLIT THICKNESS;  Surgeon: [name], MD;  Location: [place];  Service: Orthopedics;  Laterality: Left;  Excisional Debridement Left Leg, Split Thickness Skin Graft, Wound VAC   stomach stapling     tummy tuck     VEIN LIGATION AND STRIPPING     Past Medical History:  Diagnosis Date   Anemia    Asthma    Cancer ([place])    hx basal cell skin cancer   CHF (congestive heart failure) (HCC)    does not see a cardiologist   COPD (chronic obstructive pulmonary disease) ([place])    3L continuous   Diabetes mellitus    not on medication   DVT (deep venous thrombosis) (HCC)    LLE DVT '80's   GERD (gastroesophageal reflux disease)    Headache(784.0)    sinus headaches   Leg pain    Peripheral neuropathy    Peripheral vascular disease (HCC)    Psoriasis    Shortness of breath    Sleep apnea    uses bipap, sleep study done in [place] 2 years ago, dr Jeremy [name]   BP (!) 157/69   Pulse 81   Ht '5\' 10"'$  (1.778 m)   SpO2 97%   BMI 33.43 kg/m   Opioid Risk Score:   Fall Risk Score:  `1  Depression screen [facility] 2/9     10/03/2021   12:41 PM 03/05/2021   12:57 PM 08/28/2020    1:21 PM 06/22/2020   12:44 PM 04/04/2020    1:03 PM 08/20/2018   10:59 AM 03/02/2018    2:35 PM  Depression screen [facility] 2/9   Decreased Interest 0 0 0 0 0 0 0  Down, Depressed, Hopeless 0 0 0 0  0 0 0  PHQ - 2 Score 0 0 0 0 0 0 0     Review of Systems  Constitutional: Negative.   HENT: Negative.    Eyes: Negative.   Respiratory: Negative.    Cardiovascular: Negative.   Gastrointestinal: Negative.   Endocrine: Negative.   Genitourinary: Negative.   Musculoskeletal:  Positive for back pain and gait problem.  Skin: Negative.   Allergic/Immunologic: Negative.   Hematological: Negative.   Psychiatric/Behavioral:  Positive for sleep disturbance.       Objective:   Physical Exam Vitals and nursing note reviewed.  Constitutional:      Appearance: Normal appearance.  Cardiovascular:     Rate and Rhythm: Normal rate and regular rhythm.     Pulses: Normal pulses.     Heart sounds: Normal heart sounds.  Pulmonary:     Effort: Pulmonary effort is normal.     Breath sounds: Normal breath sounds.  Musculoskeletal:     Cervical back: Normal range of motion and neck supple.     Comments: Normal Muscle Bulk and Muscle Testing Reveals:  Upper Extremities: Full ROM and Muscle Strength  5/5 Lower Extremities: Right: Full ROM and Muscle Strength 5/5 Left: BKA Arrived in wheelchair    Skin:    General: Skin is warm and dry.  Neurological:     Mental Status: He is alert and oriented to person, place, and time.  Psychiatric:        Mood and Affect: Mood normal.        Behavior: Behavior normal.         Assessment & Plan:  1.Left BKA/ [place] Pain : Continue current medication regimen: Continue current regimen until 10/03/2021, Refilled: oxycodone 15 mg one tablet 4 times a day as needed # 120 and Butrans 7.5 mg weekly #4   Second script sent for the following month. We will continue the opioid monitoring program, this consists of regular clinic visits, examinations, urine drug screen, pill counts as well as use of [place] Controlled Substance Reporting system. A 12 month History has been reviewed on  the [place] Controlled Substance Reporting System on 07/02/2021.Continue: Topamax 50 mg one tablet at HS and Amitriptyline.. 2. Chronic Bilateral Low Back Pain without Sciatica: Continue HEP as Tolerated. Continue to Monitor. 10/03/2021. 3.Chronic  pain syndrome: Continue Current medication Regime. 10/03/2021. 4. Hx of spinal stimulator. Not Functioning: Jeremy [name] states he called the company, at this time he doesn't want to have a new stimulator placed at this time. Continue to Monitor. 10/03/2021. 5. COPD: On Continuous Oxygen Therapy @ 3 liters Nasal Cannula. Pulmonology Following. 10/03/2021 6. Depression: Continue: Cymbalta. Continue to monitor.  10/03/2021 7.. Sacral Decubitus Stage 3:  Continue adequate nutrition and weight shifting .Wound Care and ID Following. 10/03/2021.    F/U in 2 months

## 2021-10-08 ENCOUNTER — Telehealth: Payer: Self-pay | Admitting: *Deleted

## 2021-10-08 LAB — DRUG TOX MONITOR 1 W/CONF, ORAL FLD
Amphetamines: NEGATIVE ng/mL (ref <10)
Barbiturates: NEGATIVE ng/mL (ref <10)
Benzodiazepines: NEGATIVE ng/mL (ref <0.50)
Buprenorphine: NEGATIVE ng/mL (ref <0.10)
Cocaine: NEGATIVE ng/mL (ref <5.0)
Codeine: NEGATIVE ng/mL (ref <2.5)
Dihydrocodeine: NEGATIVE ng/mL (ref <2.5)
Fentanyl: NEGATIVE ng/mL (ref <0.10)
Heroin Metabolite: NEGATIVE ng/mL (ref <1.0)
Hydrocodone: NEGATIVE ng/mL (ref <2.5)
Hydromorphone: NEGATIVE ng/mL (ref <2.5)
MARIJUANA: NEGATIVE ng/mL (ref <2.5)
MDMA: NEGATIVE ng/mL (ref <10)
Meprobamate: NEGATIVE ng/mL (ref <2.5)
Methadone: NEGATIVE ng/mL (ref <5.0)
Morphine: NEGATIVE ng/mL (ref <2.5)
Nicotine Metabolite: NEGATIVE ng/mL (ref <5.0)
Norhydrocodone: NEGATIVE ng/mL (ref <2.5)
Noroxycodone: 72.8 ng/mL — ABNORMAL HIGH (ref <2.5)
Opiates: POSITIVE ng/mL — AB (ref <2.5)
Oxycodone: >250 ng/mL — ABNORMAL HIGH (ref <2.5)
Oxymorphone: NEGATIVE ng/mL (ref <2.5)
Phencyclidine: NEGATIVE ng/mL (ref <10)
Tapentadol: NEGATIVE ng/mL (ref <5.0)
Tramadol: NEGATIVE ng/mL (ref <5.0)
Zolpidem: NEGATIVE ng/mL (ref <5.0)

## 2021-10-08 LAB — DRUG TOX ALC METAB W/CON, ORAL FLD: Alcohol Metabolite: NEGATIVE ng/mL (ref <25)

## 2021-10-08 NOTE — Telephone Encounter (Signed)
Oral swab drug screen was consistent for prescribed medications.  ?

## 2021-12-18 ENCOUNTER — Encounter: Payer: Medicare Other | Attending: Physical Medicine & Rehabilitation | Admitting: Registered Nurse

## 2021-12-18 ENCOUNTER — Encounter: Payer: Self-pay | Admitting: Registered Nurse

## 2021-12-18 VITALS — BP 158/64 | HR 78 | Ht 70.0 in

## 2021-12-18 DIAGNOSIS — Z79891 Long term (current) use of opiate analgesic: Secondary | ICD-10-CM | POA: Diagnosis present

## 2021-12-18 DIAGNOSIS — M545 Low back pain, unspecified: Secondary | ICD-10-CM | POA: Diagnosis not present

## 2021-12-18 DIAGNOSIS — G8929 Other chronic pain: Secondary | ICD-10-CM | POA: Diagnosis present

## 2021-12-18 DIAGNOSIS — Z5181 Encounter for therapeutic drug level monitoring: Secondary | ICD-10-CM | POA: Diagnosis present

## 2021-12-18 DIAGNOSIS — L89153 Pressure ulcer of sacral region, stage 3: Secondary | ICD-10-CM | POA: Diagnosis not present

## 2021-12-18 DIAGNOSIS — G894 Chronic pain syndrome: Secondary | ICD-10-CM | POA: Diagnosis not present

## 2021-12-18 DIAGNOSIS — G546 Phantom limb syndrome with pain: Secondary | ICD-10-CM | POA: Diagnosis not present

## 2021-12-18 MED ORDER — BUPRENORPHINE 7.5 MCG/HR TD PTWK: 1.0000 | MEDICATED_PATCH | TRANSDERMAL | 1 refills | Status: DC

## 2021-12-18 MED ORDER — OXYCODONE HCL 15 MG PO TABS: 15.0000 mg | ORAL_TABLET | Freq: Four times a day (QID) | ORAL | 0 refills | Status: DC | PRN

## 2021-12-18 MED ORDER — TIZANIDINE HCL 4 MG PO TABS: 4.0000 mg | ORAL_TABLET | Freq: Three times a day (TID) | ORAL | 3 refills | Status: DC | PRN

## 2021-12-18 NOTE — Progress Notes (Signed)
Subjective:    Patient ID: Jeremy [name], male    DOB: 06-27-1950, 71 y.o.   MRN: [id number]  HPI: Jeremy [name] is a 71 y.o. male who returns for follow up appointment for chronic pain and medication refill. He states his pain is located in his sacrum  ( decubitus pain ) and left sump pain. He rates his pain 5.His  current exercise regime is performing stretching exercises.  [name] Morphine equivalent is 90.00 MME.   Last UDS was Performed on 10/03/2021, it was consistent.      Pain Inventory Average Pain 7 Pain Right Now 5 My pain is constant, sharp, stabbing, tingling, and aching  In the last 24 hours, has pain interfered with the following? General activity 7 Relation with others 7 Enjoyment of life 7 What TIME of day is your pain at its worst? daytime, evening, and night Sleep (in general) Poor  Pain is worse with: walking, sitting, and inactivity Pain improves with: medication Relief from Meds: 4  Family History  Problem Relation Age of Onset   Heart disease Brother    Social History   Socioeconomic History   Marital status: Married    Spouse name: Not on file   Number of children: Not on file   Years of education: Not on file   Highest education level: Not on file  Occupational History   Not on file  Tobacco Use   Smoking status: Former    Packs/day: 0.50    Years: 6.00    Total pack years: 3.00    Types: Cigarettes    Quit date: 12/03/1990    Years since quitting: 31.0   Smokeless tobacco: Never  Vaping Use   Vaping Use: Never used  Substance and Sexual Activity   Alcohol use: Yes    Alcohol/week: 0.0 standard drinks of alcohol    Comment: rare   Drug use: Yes   Sexual activity: Not Currently  Other Topics Concern   Not on file  Social History Narrative   Not on file   Social Determinants of Health   Financial Resource Strain: Not on file  Food Insecurity: Not on file  Transportation Needs: Not on file  Physical Activity: Not on file   Stress: Not on file  Social Connections: Not on file   Past Surgical History:  Procedure Laterality Date   AMPUTATION Left 11/25/2012   Procedure: [place];  Surgeon: [name], MD;  Location: [place];  Service: Orthopedics;  Laterality: Left;  Left Below Knee Amputation   APLIGRAFT PLACEMENT Left 07/31/2012   Procedure: Apply Skin Graft;  Surgeon: [name], MD;  Location: [place];  Service: Orthopedics;  Laterality: Left;   APPLICATION OF WOUND VAC Left 07/31/2012   Procedure: APPLICATION OF WOUND VAC;  Surgeon: [name], MD;  Location: [place];  Service: Orthopedics;  Laterality: Left;   arm surgery     back stimulator     CARDIAC CATHETERIZATION     Hx: of " around 1985 at [facility]"   CYST EXCISION     Hx: of   I & D EXTREMITY  03/13/2011   Procedure: IRRIGATION AND DEBRIDEMENT EXTREMITY;  Surgeon: [name], MD;  Location: [place];  Service: Orthopedics;  Laterality: Left;   I & D EXTREMITY Left 11/06/2012   Procedure: IRRIGATION AND DEBRIDEMENT EXTREMITY-Left lower ;  Surgeon: [name], MD;  Location: [place];  Service: Orthopedics;  Laterality: Left;  Left  Leg Irrigation and Debridement, Place Skin Graft, Wound VAC, [place]   skin grafts     SKIN SPLIT GRAFT  03/13/2012   Procedure: SKIN GRAFT SPLIT THICKNESS;  Surgeon: [name], MD;  Location: [place];  Service: Orthopedics;  Laterality: Left;  Excisional Debridement Left Leg, Split Thickness Skin Graft, Wound VAC   stomach stapling     tummy tuck     VEIN LIGATION AND STRIPPING     Past Surgical History:  Procedure Laterality Date   AMPUTATION Left 11/25/2012   Procedure: AMPUTATION BELOW KNEE;  Surgeon: [name], MD;  Location: [place];  Service: Orthopedics;  Laterality: Left;  Left Below Knee Amputation   APLIGRAFT PLACEMENT Left 07/31/2012   Procedure: Apply Skin Graft;  Surgeon: [name], MD;  Location: [place];  Service: Orthopedics;  Laterality: Left;   APPLICATION OF WOUND VAC Left 07/31/2012    Procedure: APPLICATION OF WOUND VAC;  Surgeon: [name], MD;  Location: [place];  Service: Orthopedics;  Laterality: Left;   arm surgery     back stimulator     CARDIAC CATHETERIZATION     Hx: of " around 1985 at [facility]"   CYST EXCISION     Hx: of   I & D EXTREMITY  03/13/2011   Procedure: IRRIGATION AND DEBRIDEMENT EXTREMITY;  Surgeon: [name], MD;  Location: [place];  Service: Orthopedics;  Laterality: Left;   I & D EXTREMITY Left 11/06/2012   Procedure: IRRIGATION AND DEBRIDEMENT EXTREMITY-Left lower ;  Surgeon: [name], MD;  Location: [place];  Service: Orthopedics;  Laterality: Left;  Left Leg Irrigation and Debridement, Place Skin Graft, Wound VAC, [place]   skin grafts     SKIN SPLIT GRAFT  03/13/2012   Procedure: SKIN GRAFT SPLIT THICKNESS;  Surgeon: [name], MD;  Location: [place];  Service: Orthopedics;  Laterality: Left;  Excisional Debridement Left Leg, Split Thickness Skin Graft, Wound VAC   stomach stapling     tummy tuck     VEIN LIGATION AND STRIPPING     Past Medical History:  Diagnosis Date   Anemia    Asthma    Cancer ([place])    hx basal cell skin cancer   CHF (congestive heart failure) (HCC)    does not see a cardiologist   COPD (chronic obstructive pulmonary disease) ([place])    3L continuous   Diabetes mellitus    not on medication   DVT (deep venous thrombosis) (HCC)    LLE DVT '80's   GERD (gastroesophageal reflux disease)    Headache(784.0)    sinus headaches   Leg pain    Peripheral neuropathy    Peripheral vascular disease (HCC)    Psoriasis    Shortness of breath    Sleep apnea    uses bipap, sleep study done in [place] 2 years ago, dr [name]   There were no vitals taken for this visit.  Opioid Risk Score:   Fall Risk Score:  `1  Depression screen [facility] 2/9     10/03/2021   12:41 PM 03/05/2021   12:57 PM 08/28/2020    1:21 PM 06/22/2020   12:44 PM 04/04/2020    1:03 PM 08/20/2018   10:59 AM 03/02/2018    2:35 PM   Depression screen PHQ 2/9  Decreased Interest 0 0 0 0 0 0 0  Down, Depressed, Hopeless 0 0 0 0 0 0 0  PHQ - 2 Score 0  0 0 0 0 0 0    Review of Systems  Musculoskeletal:  Positive for back pain and gait problem.       Left stump  All other systems reviewed and are negative.      Objective:   Physical Exam Vitals and nursing note reviewed.  Constitutional:      Appearance: Normal appearance.  Cardiovascular:     Rate and Rhythm: Normal rate and regular rhythm.     Pulses: Normal pulses.     Heart sounds: Normal heart sounds.  Pulmonary:     Effort: Pulmonary effort is normal.     Breath sounds: Normal breath sounds.     Comments: Continuous Oxygen @ 3 liters nasal canula Musculoskeletal:     Cervical back: Normal range of motion and neck supple.     Comments: Normal Muscle Bulk and Muscle Testing Reveals:  Upper Extremities: Full ROM and Muscle Strength 5/5 Lower Extremities: Right: Lower Extremity: Full ROM and Muscle Strength 5/5 Left Lower Extremity: BKA Arrived in Wheelchair     Skin:    General: Skin is warm and dry.  Neurological:     Mental Status: He is alert and oriented to person, place, and time.  Psychiatric:        Mood and Affect: Mood normal.        Behavior: Behavior normal.         Assessment & Plan:  1.Left BKA/ [place] Pain : Continue current medication regimen: Continue current regimen until 12/18/2021, Refilled: oxycodone 15 mg one tablet 4 times a day as needed # 120 and Butrans 7.5 mg weekly #4   Second script sent for the following month. We will continue the opioid monitoring program, this consists of regular clinic visits, examinations, urine drug screen, pill counts as well as use of [place] Controlled Substance Reporting system. A 12 month History has been reviewed on the [place] Controlled Substance Reporting System on 07/02/2021.Continue: Topamax 50 mg one tablet at HS and Amitriptyline.. 2. Chronic Bilateral Low Back Pain  without Sciatica: Continue HEP as Tolerated. Continue to Monitor. 12/18/2021. 3.Chronic  pain syndrome: Continue Current medication Regime. 12/18/2021. 4. Hx of spinal stimulator. Not Functioning: Mr. Mazurowski states he called the company, at this time he doesn't want to have a new stimulator placed at this time. Continue to Monitor. 12/18/2021. 5. COPD: On Continuous Oxygen Therapy @ 3 liters Nasal Cannula. Pulmonology Following. 12/18/2021 6. Depression: Continue: Cymbalta. Continue to monitor.  12/18/2021 7.. Sacral Decubitus Stage 3:  Continue adequate nutrition and weight shifting .Wound Care and ID Following. 12/18/2021.    F/U in 2 months

## 2021-12-19 ENCOUNTER — Telehealth: Payer: Self-pay

## 2021-12-19 NOTE — Telephone Encounter (Signed)
Patient calling in regards to pharmacy stating that they may not have medication on Saturday for his refill, called and confirmed with pharmacist, called patient and advised to call pharmacy Friday since we are not here the weekend and closed on Monday to see if they will have the medication Saturday and if not to call another pharmacy and then give [place] a call so we can send medication to different pharmacy.

## 2022-02-13 ENCOUNTER — Encounter: Payer: Self-pay | Admitting: Registered Nurse

## 2022-02-13 ENCOUNTER — Encounter: Payer: Medicare Other | Attending: Physical Medicine & Rehabilitation | Admitting: Registered Nurse

## 2022-02-13 VITALS — BP 164/66 | HR 68

## 2022-02-13 DIAGNOSIS — L89153 Pressure ulcer of sacral region, stage 3: Secondary | ICD-10-CM | POA: Diagnosis present

## 2022-02-13 DIAGNOSIS — G8929 Other chronic pain: Secondary | ICD-10-CM | POA: Insufficient documentation

## 2022-02-13 DIAGNOSIS — M545 Low back pain, unspecified: Secondary | ICD-10-CM | POA: Diagnosis present

## 2022-02-13 DIAGNOSIS — G894 Chronic pain syndrome: Secondary | ICD-10-CM | POA: Insufficient documentation

## 2022-02-13 DIAGNOSIS — Z5181 Encounter for therapeutic drug level monitoring: Secondary | ICD-10-CM | POA: Diagnosis present

## 2022-02-13 DIAGNOSIS — G546 Phantom limb syndrome with pain: Secondary | ICD-10-CM | POA: Diagnosis present

## 2022-02-13 DIAGNOSIS — Z79891 Long term (current) use of opiate analgesic: Secondary | ICD-10-CM | POA: Insufficient documentation

## 2022-02-13 MED ORDER — OXYCODONE HCL 15 MG PO TABS: 15.0000 mg | ORAL_TABLET | Freq: Four times a day (QID) | ORAL | 0 refills | Status: DC | PRN

## 2022-02-13 MED ORDER — TIZANIDINE HCL 4 MG PO TABS: 4.0000 mg | ORAL_TABLET | Freq: Three times a day (TID) | ORAL | 3 refills | Status: DC | PRN

## 2022-02-13 MED ORDER — BUPRENORPHINE 7.5 MCG/HR TD PTWK: 1.0000 | MEDICATED_PATCH | TRANSDERMAL | 1 refills | Status: DC

## 2022-02-13 NOTE — Progress Notes (Signed)
Subjective:    Patient ID: Jeremy [name], male    DOB: 09/02/1950, 71 y.o.   MRN: [id number]  HPI: Jeremy [name] is a 71 y.o. male who returns for follow up appointment for chronic pain and medication refill. He states his pain is located in his sacrum and phantom pain. He rates his pain 8. His current exercise regime is performing stretching exercises.  [name] Morphine equivalent is 90.00 MME.   Oral Swab was Performed today.    Pain Inventory Average Pain 8 Pain Right Now 8 My pain is constant, sharp, stabbing, and aching  In the last 24 hours, has pain interfered with the following? General activity 7 Relation with others 5 Enjoyment of life 8 What TIME of day is your pain at its worst? morning  and evening Sleep (in general) Poor  Pain is worse with: inactivity Pain improves with: medication Relief from Meds: 5  Family History  Problem Relation Age of Onset  . Heart disease Brother    Social History   Socioeconomic History  . Marital status: Married    Spouse name: Not on file  . Number of children: Not on file  . Years of education: Not on file  . Highest education level: Not on file  Occupational History  . Not on file  Tobacco Use  . Smoking status: Former    Packs/day: 0.50    Years: 6.00    Total pack years: 3.00    Types: Cigarettes    Quit date: 12/03/1990    Years since quitting: 31.2  . Smokeless tobacco: Never  Vaping Use  . Vaping Use: Never used  Substance and Sexual Activity  . Alcohol use: Yes    Alcohol/week: 0.0 standard drinks of alcohol    Comment: rare  . Drug use: Yes  . Sexual activity: Not Currently  Other Topics Concern  . Not on file  Social History Narrative  . Not on file   Social Determinants of Health   Financial Resource Strain: Not on file  Food Insecurity: Not on file  Transportation Needs: Not on file  Physical Activity: Not on file  Stress: Not on file  Social Connections: Not on file   Past Surgical History:   Procedure Laterality Date  . AMPUTATION Left 11/25/2012   Procedure: AMPUTATION BELOW KNEE;  Surgeon: [name], MD;  Location: [place];  Service: Orthopedics;  Laterality: Left;  Left Below Knee Amputation  . APLIGRAFT PLACEMENT Left 07/31/2012   Procedure: Apply Skin Graft;  Surgeon: [name], MD;  Location: [place];  Service: Orthopedics;  Laterality: Left;  . APPLICATION OF WOUND VAC Left 07/31/2012   Procedure: APPLICATION OF WOUND VAC;  Surgeon: [name], MD;  Location: [place];  Service: Orthopedics;  Laterality: Left;  . arm surgery    . back stimulator    . CARDIAC CATHETERIZATION     Hx: of " around 1985 at [facility]"  . CYST EXCISION     Hx: of  . I & D EXTREMITY  03/13/2011   Procedure: IRRIGATION AND DEBRIDEMENT EXTREMITY;  Surgeon: [name], MD;  Location: [place];  Service: Orthopedics;  Laterality: Left;  . I & D EXTREMITY Left 11/06/2012   Procedure: IRRIGATION AND DEBRIDEMENT EXTREMITY-Left lower ;  Surgeon: [name], MD;  Location: [place];  Service: Orthopedics;  Laterality: Left;  Left Leg Irrigation and Debridement, Place Skin Graft, Wound VAC, [place]  . skin grafts    .  SKIN SPLIT GRAFT  03/13/2012   Procedure: SKIN GRAFT SPLIT THICKNESS;  Surgeon: [name], MD;  Location: [place];  Service: Orthopedics;  Laterality: Left;  Excisional Debridement Left Leg, Split Thickness Skin Graft, Wound VAC  . stomach stapling    . tummy tuck    . VEIN LIGATION AND STRIPPING     Past Surgical History:  Procedure Laterality Date  . AMPUTATION Left 11/25/2012   Procedure: AMPUTATION BELOW KNEE;  Surgeon: [name], MD;  Location: [place];  Service: Orthopedics;  Laterality: Left;  Left Below Knee Amputation  . APLIGRAFT PLACEMENT Left 07/31/2012   Procedure: Apply Skin Graft;  Surgeon: [name], MD;  Location: [place];  Service: Orthopedics;  Laterality: Left;  . APPLICATION OF WOUND VAC Left 07/31/2012   Procedure: APPLICATION OF WOUND VAC;  Surgeon: [name],  MD;  Location: [place];  Service: Orthopedics;  Laterality: Left;  . arm surgery    . back stimulator    . CARDIAC CATHETERIZATION     Hx: of " around 1985 at [facility]"  . CYST EXCISION     Hx: of  . I & D EXTREMITY  03/13/2011   Procedure: IRRIGATION AND DEBRIDEMENT EXTREMITY;  Surgeon: [name], MD;  Location: [place];  Service: Orthopedics;  Laterality: Left;  . I & D EXTREMITY Left 11/06/2012   Procedure: IRRIGATION AND DEBRIDEMENT EXTREMITY-Left lower ;  Surgeon: [name], MD;  Location: [place];  Service: Orthopedics;  Laterality: Left;  Left Leg Irrigation and Debridement, Place Skin Graft, Wound VAC, [place]  . skin grafts    . SKIN SPLIT GRAFT  03/13/2012   Procedure: SKIN GRAFT SPLIT THICKNESS;  Surgeon: [name], MD;  Location: [place];  Service: Orthopedics;  Laterality: Left;  Excisional Debridement Left Leg, Split Thickness Skin Graft, Wound VAC  . stomach stapling    . tummy tuck    . VEIN LIGATION AND STRIPPING     Past Medical History:  Diagnosis Date  . Anemia   . Asthma   . Cancer ([facility])    hx basal cell skin cancer  . CHF (congestive heart failure) ([place])    does not see a cardiologist  . COPD (chronic obstructive pulmonary disease) ([place])    3L continuous  . Diabetes mellitus    not on medication  . DVT (deep venous thrombosis) ([facility])    LLE DVT '80's  . GERD (gastroesophageal reflux disease)   . Headache(784.0)    sinus headaches  . Leg pain   . Peripheral neuropathy   . Peripheral vascular disease ([place])   . Psoriasis   . Shortness of breath   . Sleep apnea    uses bipap, sleep study done in [place] 2 years ago, dr [name]   BP (!) 170/75   Pulse 67   SpO2 98%   Opioid Risk Score:   Fall Risk Score:  `1  Depression screen PHQ 2/9     12/18/2021    1:52 PM 10/03/2021   12:41 PM 03/05/2021   12:57 PM 08/28/2020    1:21 PM 06/22/2020   12:44 PM 04/04/2020    1:03 PM 08/20/2018   10:59 AM  Depression screen PHQ 2/9  Decreased Interest 0 0  0 0 0 0 0  Down, Depressed, Hopeless 0 0 0 0 0 0 0  PHQ - 2 Score 0 0 0 0 0 0 0      Review of Systems  Musculoskeletal:  Positive for back pain.       Left ankle pain  All other systems reviewed and are negative.     Objective:   Physical Exam Vitals and nursing note reviewed.  Constitutional:      Appearance: Normal appearance.  Cardiovascular:     Rate and Rhythm: Normal rate and regular rhythm.     Pulses: Normal pulses.     Heart sounds: Normal heart sounds.  Pulmonary:     Effort: Pulmonary effort is normal.     Breath sounds: Normal breath sounds.     Comments: Continuous Oxygen: 3 liters nasal cannula  Musculoskeletal:     Cervical back: Normal range of motion and neck supple.     Comments: Normal Muscle Bulk and Muscle Testing Reveals:  Upper Extremities: Full ROM and Muscle Strength 5/5  Lower Extremities : Right: Full ROM and Muscle Strength 5/5 Left Lower Extremity: BKA Arrived in wheelchair      Skin:    General: Skin is warm and dry.  Neurological:     Mental Status: He is alert and oriented to person, place, and time.  Psychiatric:        Mood and Affect: Mood normal.        Behavior: Behavior normal.         Assessment & Plan:  1.Left BKA/ [place] Pain : Continue current medication regimen: Continue current regimen until 02/13/2022, Refilled: oxycodone 15 mg one tablet 4 times a day as needed # 120 and Butrans 7.5 mg weekly #4   Second script sent for the following month. We will continue the opioid monitoring program, this consists of regular clinic visits, examinations, urine drug screen, pill counts as well as use of [place] Controlled Substance Reporting system. A 12 month History has been reviewed on the [place] Controlled Substance Reporting System on 02/13/2022.Continue: Topamax 50 mg one tablet at HS and Amitriptyline.. 2. Chronic Bilateral Low Back Pain without Sciatica: Continue HEP as Tolerated. Continue to Monitor.  02/13/2022. 3.Chronic  pain syndrome: Continue Current medication Regime. 02/13/2022. 4. Hx of spinal stimulator. Not Functioning: Mr. Perrot states he called the company, at this time he doesn't want to have a new stimulator placed at this time. Continue to Monitor. 02/13/2022. 5. COPD: On Continuous Oxygen Therapy @ 3 liters Nasal Cannula. Pulmonology Following. 02/13/2022 6. Depression: Continue: Cymbalta. Continue to monitor.  02/13/2022 7.. Sacral Decubitus Stage 3:  Continue adequate nutrition and weight shifting .Wound Care and ID Following. 010/25/2023.    F/U in 2 months

## 2022-02-16 LAB — DRUG TOX MONITOR 1 W/CONF, ORAL FLD

## 2022-02-16 LAB — DRUG TOX ALC METAB W/CON, ORAL FLD: Alcohol Metabolite: NEGATIVE ng/mL (ref <25)

## 2022-04-10 ENCOUNTER — Encounter: Payer: Medicare Other | Attending: Physical Medicine & Rehabilitation | Admitting: Registered Nurse

## 2022-04-10 ENCOUNTER — Encounter: Payer: Self-pay | Admitting: Registered Nurse

## 2022-04-10 VITALS — BP 160/63 | HR 69 | Ht 70.0 in

## 2022-04-10 DIAGNOSIS — G8929 Other chronic pain: Secondary | ICD-10-CM

## 2022-04-10 DIAGNOSIS — M545 Low back pain, unspecified: Secondary | ICD-10-CM | POA: Diagnosis present

## 2022-04-10 DIAGNOSIS — Z5181 Encounter for therapeutic drug level monitoring: Secondary | ICD-10-CM

## 2022-04-10 DIAGNOSIS — Z79891 Long term (current) use of opiate analgesic: Secondary | ICD-10-CM

## 2022-04-10 DIAGNOSIS — L89153 Pressure ulcer of sacral region, stage 3: Secondary | ICD-10-CM

## 2022-04-10 DIAGNOSIS — G546 Phantom limb syndrome with pain: Secondary | ICD-10-CM

## 2022-04-10 DIAGNOSIS — G894 Chronic pain syndrome: Secondary | ICD-10-CM

## 2022-04-10 MED ORDER — OXYCODONE HCL 15 MG PO TABS: 15.0000 mg | ORAL_TABLET | Freq: Four times a day (QID) | ORAL | 0 refills | Status: DC | PRN

## 2022-04-10 MED ORDER — BUPRENORPHINE 10 MCG/HR TD PTWK: 1.0000 | MEDICATED_PATCH | TRANSDERMAL | 0 refills | Status: DC

## 2022-04-10 MED ORDER — AMITRIPTYLINE HCL 25 MG PO TABS: 25.0000 mg | ORAL_TABLET | Freq: Every day | ORAL | 2 refills | Status: DC

## 2022-04-10 NOTE — Progress Notes (Signed)
Subjective:    Patient ID: Jeremy [name], male    DOB: 1950-10-06, 71 y.o.   MRN: [id number]  HPI: Jeremy [name] is a 71 y.o. male who returns for follow up appointment for chronic pain and medication refill. He states his pain is located in his sacrum ( sacral Pain: decubitus).. Jeremy [name] reports his pain not being controlled with current medication regimen, only receiving 2- 3 hours of relief. We will increase his Butrans Patch, he will send a My-Chart message in two weeks with update on medication change, He verbalizes understanding.  He rates his pain 7. His current exercise regime is performing stretching exercises.  Jeremy [name] Morphine equivalent is 90.00 MME.   Last Oral Swab was Performed on 02/13/2022, see note for details.      Pain Inventory Average Pain 8 Pain Right Now 7 My pain is constant, sharp, stabbing, and aching  In the last 24 hours, has pain interfered with the following? General activity 7 Relation with others 5 Enjoyment of life 8 What TIME of day is your pain at its worst? morning  and night Sleep (in general) Poor  Pain is worse with: inactivity Pain improves with: medication Relief from Meds: 4  Family History  Problem Relation Age of Onset   Heart disease Brother    Social History   Socioeconomic History   Marital status: Married    Spouse name: Not on file   Number of children: Not on file   Years of education: Not on file   Highest education level: Not on file  Occupational History   Not on file  Tobacco Use   Smoking status: Former    Packs/day: 0.50    Years: 6.00    Total pack years: 3.00    Types: Cigarettes    Quit date: 12/03/1990    Years since quitting: 31.3   Smokeless tobacco: Never  Vaping Use   Vaping Use: Never used  Substance and Sexual Activity   Alcohol use: Yes    Alcohol/week: 0.0 standard drinks of alcohol    Comment: rare   Drug use: Yes   Sexual activity: Not Currently  Other Topics Concern   Not on file  Social  History Narrative   Not on file   Social Determinants of Health   Financial Resource Strain: Not on file  Food Insecurity: Not on file  Transportation Needs: Not on file  Physical Activity: Not on file  Stress: Not on file  Social Connections: Not on file   Past Surgical History:  Procedure Laterality Date   AMPUTATION Left 11/25/2012   Procedure: [place];  Surgeon: [name], MD;  Location: [place];  Service: Orthopedics;  Laterality: Left;  Left Below Knee Amputation   APLIGRAFT PLACEMENT Left 07/31/2012   Procedure: Apply Skin Graft;  Surgeon: [name], MD;  Location: [place];  Service: Orthopedics;  Laterality: Left;   APPLICATION OF WOUND VAC Left 07/31/2012   Procedure: APPLICATION OF WOUND VAC;  Surgeon: [name], MD;  Location: [place];  Service: Orthopedics;  Laterality: Left;   arm surgery     back stimulator     CARDIAC CATHETERIZATION     Hx: of " around 1985 at [facility]"   CYST EXCISION     Hx: of   I & D EXTREMITY  03/13/2011   Procedure: IRRIGATION AND DEBRIDEMENT EXTREMITY;  Surgeon: [name], MD;  Location: [place];  Service: Orthopedics;  Laterality:  Left;   I & D EXTREMITY Left 11/06/2012   Procedure: IRRIGATION AND DEBRIDEMENT EXTREMITY-Left lower ;  Surgeon: [name], MD;  Location: [place];  Service: Orthopedics;  Laterality: Left;  Left Leg Irrigation and Debridement, Place Skin Graft, Wound VAC, [place]in   skin grafts     SKIN SPLIT GRAFT  03/13/2012   Procedure: SKIN GRAFT SPLIT THICKNESS;  Surgeon: [name], MD;  Location: [place];  Service: Orthopedics;  Laterality: Left;  Excisional Debridement Left Leg, Split Thickness Skin Graft, Wound VAC   stomach stapling     tummy tuck     VEIN LIGATION AND STRIPPING     Past Surgical History:  Procedure Laterality Date   AMPUTATION Left 11/25/2012   Procedure: AMPUTATION BELOW KNEE;  Surgeon: [name], MD;  Location: [place];  Service: Orthopedics;  Laterality: Left;  Left Below Knee  Amputation   APLIGRAFT PLACEMENT Left 07/31/2012   Procedure: Apply Skin Graft;  Surgeon: [name], MD;  Location: [place];  Service: Orthopedics;  Laterality: Left;   APPLICATION OF WOUND VAC Left 07/31/2012   Procedure: APPLICATION OF WOUND VAC;  Surgeon: [name], MD;  Location: [place];  Service: Orthopedics;  Laterality: Left;   arm surgery     back stimulator     CARDIAC CATHETERIZATION     Hx: of " around 1985 at [facility]"   CYST EXCISION     Hx: of   I & D EXTREMITY  03/13/2011   Procedure: IRRIGATION AND DEBRIDEMENT EXTREMITY;  Surgeon: [name], MD;  Location: [place];  Service: Orthopedics;  Laterality: Left;   I & D EXTREMITY Left 11/06/2012   Procedure: IRRIGATION AND DEBRIDEMENT EXTREMITY-Left lower ;  Surgeon: [name], MD;  Location: [place];  Service: Orthopedics;  Laterality: Left;  Left Leg Irrigation and Debridement, Place Skin Graft, Wound VAC, [place]in   skin grafts     SKIN SPLIT GRAFT  03/13/2012   Procedure: SKIN GRAFT SPLIT THICKNESS;  Surgeon: [name], MD;  Location: [place];  Service: Orthopedics;  Laterality: Left;  Excisional Debridement Left Leg, Split Thickness Skin Graft, Wound VAC   stomach stapling     tummy tuck     VEIN LIGATION AND STRIPPING     Past Medical History:  Diagnosis Date   Anemia    Asthma    Cancer ([place])    hx basal cell skin cancer   CHF (congestive heart failure) ([facility])    does not see a cardiologist   COPD (chronic obstructive pulmonary disease) ([place])    3L continuous   Diabetes mellitus    not on medication   DVT (deep venous thrombosis) ([facility])    LLE DVT '80's   GERD (gastroesophageal reflux disease)    Headache(784.0)    sinus headaches   Leg pain    Peripheral neuropathy    Peripheral vascular disease ([facility])    Psoriasis    Shortness of breath    Sleep apnea    uses bipap, sleep study done in [place] 2 years ago, dr [name]   BP (!) 160/63   Pulse 69   Ht '5\' 10"'$  (1.778 m)   SpO2 97% Comment: 3  liters for O2  BMI 33.43 kg/m   Opioid Risk Score:   Fall Risk Score:  `1  Depression screen [facility] 2/9     12/18/2021    1:52 PM 10/03/2021   12:41 PM 03/05/2021  12:57 PM 08/28/2020    1:21 PM 06/22/2020   12:44 PM 04/04/2020    1:03 PM 08/20/2018   10:59 AM  Depression screen PHQ 2/9  Decreased Interest 0 0 0 0 0 0 0  Down, Depressed, Hopeless 0 0 0 0 0 0 0  PHQ - 2 Score 0 0 0 0 0 0 0    Review of Systems  Musculoskeletal:  Positive for back pain and gait problem.       Left leg pain  All other systems reviewed and are negative.      Objective:   Physical Exam Vitals and nursing note reviewed.  Constitutional:      Appearance: Normal appearance.  Cardiovascular:     Rate and Rhythm: Normal rate and regular rhythm.     Pulses: Normal pulses.     Heart sounds: Normal heart sounds.  Pulmonary:     Effort: Pulmonary effort is normal.     Breath sounds: Normal breath sounds.     Comments: Continuous Oxygen@ 3 liters Nasal Cannula Musculoskeletal:     Cervical back: Normal range of motion and neck supple.     Comments: Normal Muscle Bulk and Muscle Testing Reveals:  Upper Extremities: Full ROM and Muscle Strength 5/5  Lumbar Paraspinal Tenderness: L-3-L-5 Lower Extremities: Right: Full ROM and Muscle Strength 5/5 Left: BKA Arrived in wheelchair   Skin:    General: Skin is warm and dry.  Neurological:     Mental Status: He is alert and oriented to person, place, and time.  Psychiatric:        Mood and Affect: Mood normal.        Behavior: Behavior normal.          Assessment & Plan:  1.Left BKA/ [place] Pain : Continue current medication regimen: Continue current regimen until 04/10/2022, Refilled: oxycodone 15 mg one tablet 4 times a day as needed # 120 and Increased Butrans 10 mcg weekly #4   Second script sent for the following month. We will continue the opioid monitoring program, this consists of regular clinic visits, examinations, urine drug screen, pill  counts as well as use of [place] Controlled Substance Reporting system. A 12 month History has been reviewed on the [place] Controlled Substance Reporting System on 02/13/2022.Continue: Topamax 50 mg one tablet at HS and Amitriptyline.. 2. Chronic Bilateral Low Back Pain without Sciatica: Continue HEP as Tolerated. Continue to Monitor. 04/10/2022. 3.Chronic  pain syndrome: Continue Current medication Regime. 04/10/2022. 4. Hx of spinal stimulator. Not Functioning: Jeremy [name] states he called the company, at this time he doesn't want to have a new stimulator placed at this time. Continue to Monitor. 04/10/2022. 5. COPD: On Continuous Oxygen Therapy @ 3 liters Nasal Cannula. Pulmonology Following. 04/10/2022 6. Depression: Continue: Cymbalta. Continue to monitor.  04/10/2022 7.. Sacral Decubitus Stage 3:  Continue adequate nutrition and weight shifting .Wound Care and ID Following. 04/10/2022.    F/U in 2 months

## 2022-06-11 ENCOUNTER — Telehealth: Payer: Self-pay | Admitting: Physical Medicine & Rehabilitation

## 2022-06-11 MED ORDER — BUPRENORPHINE 10 MCG/HR TD PTWK: 1.0000 | MEDICATED_PATCH | TRANSDERMAL | 0 refills | Status: DC

## 2022-06-11 MED ORDER — OXYCODONE HCL 15 MG PO TABS: 15.0000 mg | ORAL_TABLET | Freq: Four times a day (QID) | ORAL | 0 refills | Status: DC | PRN

## 2022-06-11 NOTE — Telephone Encounter (Signed)
We had to reschedule patient for Jeremy [name], she is out all week and patient will need medication.

## 2022-06-11 NOTE — Telephone Encounter (Signed)
Butrans and oxycodone refilled

## 2022-06-12 ENCOUNTER — Encounter: Payer: Medicare Other | Admitting: Registered Nurse

## 2022-07-09 ENCOUNTER — Encounter: Payer: Medicare Other | Attending: Physical Medicine & Rehabilitation | Admitting: Registered Nurse

## 2022-07-09 ENCOUNTER — Encounter: Payer: Self-pay | Admitting: Registered Nurse

## 2022-07-09 VITALS — BP 157/57 | HR 63 | Ht 70.0 in | Wt 235.4 lb

## 2022-07-09 DIAGNOSIS — G546 Phantom limb syndrome with pain: Secondary | ICD-10-CM | POA: Diagnosis present

## 2022-07-09 DIAGNOSIS — Z79891 Long term (current) use of opiate analgesic: Secondary | ICD-10-CM | POA: Diagnosis present

## 2022-07-09 DIAGNOSIS — G894 Chronic pain syndrome: Secondary | ICD-10-CM | POA: Diagnosis present

## 2022-07-09 DIAGNOSIS — L89153 Pressure ulcer of sacral region, stage 3: Secondary | ICD-10-CM

## 2022-07-09 DIAGNOSIS — Z5181 Encounter for therapeutic drug level monitoring: Secondary | ICD-10-CM | POA: Diagnosis present

## 2022-07-09 MED ORDER — BUPRENORPHINE 10 MCG/HR TD PTWK: 1.0000 | MEDICATED_PATCH | TRANSDERMAL | 0 refills | Status: DC

## 2022-07-09 MED ORDER — OXYCODONE HCL 15 MG PO TABS: 15.0000 mg | ORAL_TABLET | Freq: Four times a day (QID) | ORAL | 0 refills | Status: DC | PRN

## 2022-07-09 NOTE — Progress Notes (Signed)
Subjective:    Patient ID: Jeremy [name], male    DOB: [birth year], 72 y.o.   MRN: [id number]  HPI: Jeremy [name] is a 72 y.o. male who returns for follow up appointment for chronic pain and medication refill. He states his pain is located in his buttocks ( sacral decubitus) . He rates his pain 6. His current exercise regime is performing stretching exercises.  Jeremy [name] reports he was hospitalized at [facility] for Pneumonia in March.   Jeremy [name] Morphine equivalent is 90.00 MME.   Oral Swab was Performed today.   Arrived with elevate blood pressure: Blood Pressure was re-checked 139/68, he is compliant with hi medication. He will keep blood pressure log and  F/U with his PCP.    Pain Inventory Average Pain 7 Pain Right Now 6 My pain is sharp, stabbing, and aching  In the last 24 hours, has pain interfered with the following? General activity 7 Relation with others 7 Enjoyment of life 7 What TIME of day is your pain at its worst? daytime Sleep (in general) NA  Pain is worse with: walking and inactivity Pain improves with: medication Relief from Meds: 4  Family History  Problem Relation Age of Onset   Heart disease Brother    Social History   Socioeconomic History   Marital status: Married    Spouse name: Not on file   Number of children: Not on file   Years of education: Not on file   Highest education level: Not on file  Occupational History   Not on file  Tobacco Use   Smoking status: Former    Packs/day: 0.50    Years: 6.00    Additional pack years: 0.00    Total pack years: 3.00    Types: Cigarettes    Quit date: 12/03/1990    Years since quitting: 31.6   Smokeless tobacco: Never  Vaping Use   Vaping Use: Never used  Substance and Sexual Activity   Alcohol use: Yes    Alcohol/week: 0.0 standard drinks of alcohol    Comment: rare   Drug use: Yes   Sexual activity: Not Currently  Other Topics Concern   Not on file  Social History Narrative   Not on file    Social Determinants of Health   Financial Resource Strain: Not on file  Food Insecurity: Not on file  Transportation Needs: Not on file  Physical Activity: Not on file  Stress: Not on file  Social Connections: Not on file   Past Surgical History:  Procedure Laterality Date   AMPUTATION Left 11/25/2012   Procedure: [place];  Surgeon: [name], MD;  Location: [place];  Service: Orthopedics;  Laterality: Left;  Left Below Knee Amputation   APLIGRAFT PLACEMENT Left 07/31/2012   Procedure: Apply Skin Graft;  Surgeon: [name], MD;  Location: [place];  Service: Orthopedics;  Laterality: Left;   APPLICATION OF WOUND VAC Left 07/31/2012   Procedure: APPLICATION OF WOUND VAC;  Surgeon: [name], MD;  Location: [place];  Service: Orthopedics;  Laterality: Left;   arm surgery     back stimulator     CARDIAC CATHETERIZATION     Hx: of " around 1985 at [facility]"   CYST EXCISION     Hx: of   I & D EXTREMITY  03/13/2011   Procedure: IRRIGATION AND DEBRIDEMENT EXTREMITY;  Surgeon: [name], MD;  Location: [place];  Service: Orthopedics;  Laterality: Left;  I & D EXTREMITY Left 11/06/2012   Procedure: IRRIGATION AND DEBRIDEMENT EXTREMITY-Left lower ;  Surgeon: [name], MD;  Location: [place];  Service: Orthopedics;  Laterality: Left;  Left Leg Irrigation and Debridement, Place Skin Graft, Wound VAC, [place]   skin grafts     SKIN SPLIT GRAFT  03/13/2012   Procedure: SKIN GRAFT SPLIT THICKNESS;  Surgeon: [name], MD;  Location: [place];  Service: Orthopedics;  Laterality: Left;  Excisional Debridement Left Leg, Split Thickness Skin Graft, Wound VAC   stomach stapling     tummy tuck     VEIN LIGATION AND STRIPPING     Past Surgical History:  Procedure Laterality Date   AMPUTATION Left 11/25/2012   Procedure: AMPUTATION BELOW KNEE;  Surgeon: [name], MD;  Location: [place];  Service: Orthopedics;  Laterality: Left;  Left Below Knee Amputation   APLIGRAFT PLACEMENT  Left 07/31/2012   Procedure: Apply Skin Graft;  Surgeon: [name], MD;  Location: [place];  Service: Orthopedics;  Laterality: Left;   APPLICATION OF WOUND VAC Left 07/31/2012   Procedure: APPLICATION OF WOUND VAC;  Surgeon: [name], MD;  Location: [place];  Service: Orthopedics;  Laterality: Left;   arm surgery     back stimulator     CARDIAC CATHETERIZATION     Hx: of " around 1985 at [facility]"   CYST EXCISION     Hx: of   I & D EXTREMITY  03/13/2011   Procedure: IRRIGATION AND DEBRIDEMENT EXTREMITY;  Surgeon: [name], MD;  Location: [place];  Service: Orthopedics;  Laterality: Left;   I & D EXTREMITY Left 11/06/2012   Procedure: IRRIGATION AND DEBRIDEMENT EXTREMITY-Left lower ;  Surgeon: [name], MD;  Location: [place];  Service: Orthopedics;  Laterality: Left;  Left Leg Irrigation and Debridement, Place Skin Graft, Wound VAC, [place]in   skin grafts     SKIN SPLIT GRAFT  03/13/2012   Procedure: SKIN GRAFT SPLIT THICKNESS;  Surgeon: [name], MD;  Location: [place];  Service: Orthopedics;  Laterality: Left;  Excisional Debridement Left Leg, Split Thickness Skin Graft, Wound VAC   stomach stapling     tummy tuck     VEIN LIGATION AND STRIPPING     Past Medical History:  Diagnosis Date   Anemia    Asthma    Cancer ([place])    hx basal cell skin cancer   CHF (congestive heart failure) (HCC)    does not see a cardiologist   COPD (chronic obstructive pulmonary disease) ([place])    3L continuous   Diabetes mellitus    not on medication   DVT (deep venous thrombosis) (HCC)    LLE DVT '80's   GERD (gastroesophageal reflux disease)    Headache(784.0)    sinus headaches   Leg pain    Peripheral neuropathy    Peripheral vascular disease (HCC)    Psoriasis    Shortness of breath    Sleep apnea    uses bipap, sleep study done in [place] 2 years ago, dr [name]   BP (!) 154/61   Pulse 63   Ht 5\' 10"  (1.778 m)   Wt 235 lb 6.4 oz (106.8 kg)   SpO2 98% Comment: 3L [place]   BMI 33.78 kg/m   Opioid Risk Score:   Fall Risk Score:  `1  Depression screen [facility] 2/9     07/09/2022    2:25 PM 04/10/2022  1:00 PM 12/18/2021    1:52 PM 10/03/2021   12:41 PM 03/05/2021   12:57 PM 08/28/2020    1:21 PM 06/22/2020   12:44 PM  Depression screen PHQ 2/9  Decreased Interest 0 0 0 0 0 0 0  Down, Depressed, Hopeless 0 0 0 0 0 0 0  PHQ - 2 Score 0 0 0 0 0 0 0    Review of Systems  Constitutional: Negative.   HENT: Negative.    Eyes: Negative.   Respiratory: Negative.    Cardiovascular: Negative.   Gastrointestinal: Negative.   Endocrine: Negative.   Genitourinary: Negative.   Musculoskeletal:  Positive for arthralgias, back pain and gait problem.  Skin: Negative.   Allergic/Immunologic: Negative.   Hematological: Negative.   Psychiatric/Behavioral: Negative.    All other systems reviewed and are negative.      Objective:   Physical Exam Vitals and nursing note reviewed.  Constitutional:      Appearance: Normal appearance.  Cardiovascular:     Rate and Rhythm: Normal rate and regular rhythm.     Pulses: Normal pulses.     Heart sounds: Normal heart sounds.  Pulmonary:     Effort: Pulmonary effort is normal.     Breath sounds: Normal breath sounds.     Comments: Continuous Oxygen @ 3 Liters Nasal Cannula  Musculoskeletal:     Cervical back: Normal range of motion and neck supple.     Comments: Normal Muscle Bulk and Muscle Testing Reveals:  Upper Extremities: Full ROM and Muscle Strength 5/5  Lower Extremities: Right: Full ROM and Muscle Strength 5/5 Left Lower Extremity: BKA Arrived in wheelchair  Gait     Neurological:     Mental Status: He is alert and oriented to person, place, and time.  Psychiatric:        Mood and Affect: Mood normal.        Behavior: Behavior normal.          Assessment & Plan:  1.Left BKA/ [place] Pain : Continue current medication regimen: Continue current regimen until 07/09/2022, Refilled: oxycodone 15 mg one  tablet 4 times a day as needed # 120 and  Butrans 10 mcg weekly #4   Second script sent for the following month. We will continue the opioid monitoring program, this consists of regular clinic visits, examinations, urine drug screen, pill counts as well as use of [place] Controlled Substance Reporting system. A 12 month History has been reviewed on the [place] Controlled Substance Reporting System on 02/13/2022.Continue: Topamax 50 mg one tablet at HS and Amitriptyline.. 2. Chronic Bilateral Low Back Pain without Sciatica: Continue HEP as Tolerated. Continue to Monitor. 07/09/2022. 3.Chronic  pain syndrome: Continue Current medication Regime. 07/09/2022. 4. Hx of spinal stimulator. Not Functioning: Mr. Kint states he called the company, at this time he doesn't want to have a new stimulator placed at this time. Continue to Monitor. 07/09/2022 5. COPD: On Continuous Oxygen Therapy @ 3 liters Nasal Cannula. Pulmonology Following. 07/09/2022 6. Depression: No complaints today. Continue: Cymbalta. Continue to monitor. 03/19//2024 7.. Sacral Decubitus Stage 3:  Continue adequate nutrition and weight shifting .Wound Care and ID Following. 07/09/2022.    F/U in 2 months

## 2022-07-13 LAB — DRUG TOX MONITOR 1 W/CONF, ORAL FLD
Amphetamines: NEGATIVE ng/mL (ref <10)
Barbiturates: NEGATIVE ng/mL (ref <10)
Benzodiazepines: NEGATIVE ng/mL (ref <0.50)
Buprenorphine: 0.19 ng/mL — ABNORMAL HIGH (ref <0.10)
Buprenorphine: POSITIVE ng/mL — AB (ref <0.10)
Cocaine: NEGATIVE ng/mL (ref <5.0)
Codeine: NEGATIVE ng/mL (ref <2.5)
Dihydrocodeine: NEGATIVE ng/mL (ref <2.5)
Fentanyl: NEGATIVE ng/mL (ref <0.10)
Heroin Metabolite: NEGATIVE ng/mL (ref <1.0)
Hydrocodone: 4 ng/mL — ABNORMAL HIGH (ref <2.5)
Hydromorphone: NEGATIVE ng/mL (ref <2.5)
MARIJUANA: NEGATIVE ng/mL (ref <2.5)
MDMA: NEGATIVE ng/mL (ref <10)
Meprobamate: NEGATIVE ng/mL (ref <2.5)
Methadone: NEGATIVE ng/mL (ref <5.0)
Morphine: NEGATIVE ng/mL (ref <2.5)
Naloxone: NEGATIVE ng/mL (ref <0.25)
Nicotine Metabolite: NEGATIVE ng/mL (ref <5.0)
Norbuprenorphine: NEGATIVE ng/mL (ref <0.50)
Norhydrocodone: NEGATIVE ng/mL (ref <2.5)
Noroxycodone: NEGATIVE ng/mL (ref <2.5)
Opiates: POSITIVE ng/mL — AB (ref <2.5)
Oxycodone: >250 ng/mL — ABNORMAL HIGH (ref <2.5)
Oxymorphone: NEGATIVE ng/mL (ref <2.5)
Phencyclidine: NEGATIVE ng/mL (ref <10)
Tapentadol: NEGATIVE ng/mL (ref <5.0)
Tramadol: NEGATIVE ng/mL (ref <5.0)
Zolpidem: NEGATIVE ng/mL (ref <5.0)

## 2022-07-13 LAB — DRUG TOX ALC METAB W/CON, ORAL FLD: Alcohol Metabolite: NEGATIVE ng/mL (ref <25)

## 2022-07-23 ENCOUNTER — Telehealth: Payer: Self-pay | Admitting: *Deleted

## 2022-07-23 NOTE — Telephone Encounter (Addendum)
Oral swab drug screen was inconsistent. He has both oxycodone and hydrocodone present.

## 2022-08-08 ENCOUNTER — Other Ambulatory Visit: Payer: Self-pay | Admitting: Registered Nurse

## 2022-08-08 NOTE — Telephone Encounter (Signed)
PMP was Reviewed.  Butrans prescription sent to pharmacy.  Jeremy [name] is aware via My-Chart message.

## 2022-08-09 ENCOUNTER — Other Ambulatory Visit: Payer: Self-pay | Admitting: Registered Nurse

## 2022-08-09 NOTE — Telephone Encounter (Signed)
PMP was Reviewed.  Oxycodone e-scribed to pharmacy.  My- Chart message sent to Jeremy [name] regarding the above.

## 2022-09-05 ENCOUNTER — Encounter: Payer: Self-pay | Admitting: Registered Nurse

## 2022-09-05 ENCOUNTER — Encounter: Payer: Medicare Other | Attending: Physical Medicine & Rehabilitation | Admitting: Registered Nurse

## 2022-09-05 VITALS — BP 124/63 | HR 67 | Ht 70.0 in

## 2022-09-05 DIAGNOSIS — G8929 Other chronic pain: Secondary | ICD-10-CM | POA: Insufficient documentation

## 2022-09-05 DIAGNOSIS — M545 Low back pain, unspecified: Secondary | ICD-10-CM | POA: Diagnosis not present

## 2022-09-05 DIAGNOSIS — L89153 Pressure ulcer of sacral region, stage 3: Secondary | ICD-10-CM | POA: Diagnosis not present

## 2022-09-05 DIAGNOSIS — M62838 Other muscle spasm: Secondary | ICD-10-CM

## 2022-09-05 DIAGNOSIS — Z5181 Encounter for therapeutic drug level monitoring: Secondary | ICD-10-CM

## 2022-09-05 DIAGNOSIS — G894 Chronic pain syndrome: Secondary | ICD-10-CM

## 2022-09-05 DIAGNOSIS — G546 Phantom limb syndrome with pain: Secondary | ICD-10-CM

## 2022-09-05 DIAGNOSIS — S88119S Complete traumatic amputation at level between knee and ankle, unspecified lower leg, sequela: Secondary | ICD-10-CM | POA: Diagnosis present

## 2022-09-05 DIAGNOSIS — Z79891 Long term (current) use of opiate analgesic: Secondary | ICD-10-CM | POA: Diagnosis present

## 2022-09-05 MED ORDER — TIZANIDINE HCL 4 MG PO TABS: 4.0000 mg | ORAL_TABLET | Freq: Three times a day (TID) | ORAL | 3 refills | Status: DC | PRN

## 2022-09-05 MED ORDER — OXYCODONE HCL 15 MG PO TABS: 15.0000 mg | ORAL_TABLET | Freq: Four times a day (QID) | ORAL | 0 refills | Status: DC | PRN

## 2022-09-05 MED ORDER — BUPRENORPHINE 10 MCG/HR TD PTWK: 1.0000 | MEDICATED_PATCH | TRANSDERMAL | 0 refills | Status: DC

## 2022-09-05 NOTE — Progress Notes (Signed)
Subjective:    Patient ID: Jeremy [name], male    DOB: 24-Apr-1950, 72 y.o.   MRN: [id number]  HPI: Jeremy [name] is a 72 y.o. male who returns for follow up appointment for chronic pain and medication refill. He states his pain is located in his lower back and sacru, ( sacral wound), wound care following. He rates his pain 3. His current exercise regime is performing stretching exercises.  Ms. Soter Morphine equivalent is 90.00  MME.   Last Oral Swab was Inconsistent, final warning letter was given. Educated on the [employer], he verbalizes understanding.     Pain Inventory Average Pain 8 Pain Right Now 3 My pain is sharp, stabbing, and aching  In the last 24 hours, has pain interfered with the following? General activity 7 Relation with others 7 Enjoyment of life 7 What TIME of day is your pain at its worst? daytime Sleep (in general) Poor  Pain is worse with: inactivity Pain improves with: medication Relief from Meds: 4  Family History  Problem Relation Age of Onset  . Heart disease Brother    Social History   Socioeconomic History  . Marital status: Married    Spouse name: Not on file  . Number of children: Not on file  . Years of education: Not on file  . Highest education level: Not on file  Occupational History  . Not on file  Tobacco Use  . Smoking status: Former    Packs/day: 0.50    Years: 6.00    Additional pack years: 0.00    Total pack years: 3.00    Types: Cigarettes    Quit date: 12/03/1990    Years since quitting: 31.7  . Smokeless tobacco: Never  Vaping Use  . Vaping Use: Never used  Substance and Sexual Activity  . Alcohol use: Yes    Alcohol/week: 0.0 standard drinks of alcohol    Comment: rare  . Drug use: Yes  . Sexual activity: Not Currently  Other Topics Concern  . Not on file  Social History Narrative  . Not on file   Social Determinants of Health   Financial Resource Strain: Not on file  Food Insecurity: Not on file   Transportation Needs: Not on file  Physical Activity: Not on file  Stress: Not on file  Social Connections: Not on file   Past Surgical History:  Procedure Laterality Date  . AMPUTATION Left 11/25/2012   Procedure: AMPUTATION BELOW KNEE;  Surgeon: Nadara Mustard, MD;  [facility];  Service: Orthopedics;  Laterality: Left;  Left Below Knee Amputation  . APLIGRAFT PLACEMENT Left 07/31/2012   Procedure: Apply Skin Graft;  Surgeon: Nadara Mustard, MD;  Location: [facility] OR;  Service: Orthopedics;  Laterality: Left;  . APPLICATION OF WOUND VAC Left 07/31/2012   Procedure: APPLICATION OF WOUND VAC;  Surgeon: Nadara Mustard, MD;  [facility];  Service: Orthopedics;  Laterality: Left;  . arm surgery    . back stimulator    . CARDIAC CATHETERIZATION     Hx: of " around 1985 at [facility]"  . CYST EXCISION     Hx: of  . I & D EXTREMITY  03/13/2011   Procedure: IRRIGATION AND DEBRIDEMENT EXTREMITY;  Surgeon: Nadara Mustard, MD;  [facility];  Service: Orthopedics;  Laterality: Left;  . I & D EXTREMITY Left 11/06/2012   Procedure: IRRIGATION AND DEBRIDEMENT EXTREMITY-Left lower ;  Surgeon: Nadara Mustard, MD;  [facility];  Service: Orthopedics;  Laterality: Left;  Left Leg Irrigation and Debridement, Place Skin Graft, Wound VAC, [place]  . skin grafts    . SKIN SPLIT GRAFT  03/13/2012   Procedure: SKIN GRAFT SPLIT THICKNESS;  Surgeon: Nadara Mustard, MD;  [facility];  Service: Orthopedics;  Laterality: Left;  Excisional Debridement Left Leg, Split Thickness Skin Graft, Wound VAC  . stomach stapling    . tummy tuck    . VEIN LIGATION AND STRIPPING     Past Surgical History:  Procedure Laterality Date  . AMPUTATION Left 11/25/2012   Procedure: AMPUTATION BELOW KNEE;  Surgeon: Nadara Mustard, MD;  [facility];  Service: Orthopedics;  Laterality: Left;  Left Below Knee Amputation  . APLIGRAFT PLACEMENT Left 07/31/2012   Procedure: Apply Skin Graft;  Surgeon: Nadara Mustard, MD;  Location: [facility] OR;   Service: Orthopedics;  Laterality: Left;  . APPLICATION OF WOUND VAC Left 07/31/2012   Procedure: APPLICATION OF WOUND VAC;  Surgeon: Nadara Mustard, MD;  [facility];  Service: Orthopedics;  Laterality: Left;  . arm surgery    . back stimulator    . CARDIAC CATHETERIZATION     Hx: of " around 1985 at [facility]"  . CYST EXCISION     Hx: of  . I & D EXTREMITY  03/13/2011   Procedure: IRRIGATION AND DEBRIDEMENT EXTREMITY;  Surgeon: Nadara Mustard, MD;  [facility];  Service: Orthopedics;  Laterality: Left;  . I & D EXTREMITY Left 11/06/2012   Procedure: IRRIGATION AND DEBRIDEMENT EXTREMITY-Left lower ;  Surgeon: Nadara Mustard, MD;  [facility];  Service: Orthopedics;  Laterality: Left;  Left Leg Irrigation and Debridement, Place Skin Graft, Wound VAC, [place]  . skin grafts    . SKIN SPLIT GRAFT  03/13/2012   Procedure: SKIN GRAFT SPLIT THICKNESS;  Surgeon: Nadara Mustard, MD;  [facility];  Service: Orthopedics;  Laterality: Left;  Excisional Debridement Left Leg, Split Thickness Skin Graft, Wound VAC  . stomach stapling    . tummy tuck    . VEIN LIGATION AND STRIPPING     Past Medical History:  Diagnosis Date  . Anemia   . Asthma   . Cancer (HCC)    hx basal cell skin cancer  . CHF (congestive heart failure) (HCC)    does not see a cardiologist  . COPD (chronic obstructive pulmonary disease) (HCC)    3L continuous  . Diabetes mellitus    not on medication  . DVT (deep venous thrombosis) (HCC)    LLE DVT '80's  . GERD (gastroesophageal reflux disease)   . Headache(784.0)    sinus headaches  . Leg pain   . Peripheral neuropathy   . Peripheral vascular disease (HCC)   . Psoriasis   . Shortness of breath   . Sleep apnea    uses bipap, sleep study done in [place] 2 years ago, dr Cala Bradford byrd   There were no vitals taken for this visit.  Opioid Risk Score:   Fall Risk Score:  `1  Depression screen PHQ 2/9     07/09/2022    2:25 PM 04/10/2022    1:00 PM  12/18/2021    1:52 PM 10/03/2021   12:41 PM 03/05/2021   12:57 PM 08/28/2020    1:21 PM 06/22/2020   12:44 PM  Depression screen PHQ 2/9  Decreased Interest 0 0 0 0 0 0 0  Down, Depressed, Hopeless 0 0 0 0 0  0 0  PHQ - 2 Score 0 0 0 0 0 0 0     Review of Systems  Musculoskeletal:  Positive for back pain.       B/L foot pain  All other systems reviewed and are negative.     Objective:   Physical Exam Vitals and nursing note reviewed.  Constitutional:      Appearance: Normal appearance.  Cardiovascular:     Rate and Rhythm: Normal rate and regular rhythm.     Pulses: Normal pulses.     Heart sounds: Normal heart sounds.  Pulmonary:     Effort: Pulmonary effort is normal.     Breath sounds: Normal breath sounds.     Comments: Continuous Oxygen @ 3 liters Nasal Cannula  Musculoskeletal:     Cervical back: Normal range of motion and neck supple.     Comments: Normal Muscle Bulk and Muscle Testing Reveals:  Upper Extremities: Full ROM and Muscle Strength 5/5 Lumbar Paraspinal Tenderness: L-4-L-5  Lower Extremities : Right: Full ROM and Muscle strength 5/5 Left BKA Arrived in wheelchair     Skin:    General: Skin is warm and dry.  Neurological:     Mental Status: He is alert and oriented to person, place, and time.  Psychiatric:        Mood and Affect: Mood normal.        Behavior: Behavior normal.         Assessment & Plan:  1.Left BKA/ [place] Pain : Continue current medication regimen: Continue current regimen until 09/06/2022, Refilled: oxycodone 15 mg one tablet 4 times a day as needed # 120 and  Butrans 10 mcg weekly #4   Second script sent for the following month. We will continue the opioid monitoring program, this consists of regular clinic visits, examinations, urine drug screen, pill counts as well as use of [place] Controlled Substance Reporting system. A 12 month History has been reviewed on the [place] Controlled Substance Reporting System on  09/06/2022.Continue: Topamax 50 mg one tablet at HS and Amitriptyline.. 2. Chronic Bilateral Low Back Pain without Sciatica: Continue HEP as Tolerated. Continue to Monitor. 09/06/2022. 3.Chronic  pain syndrome: Continue Current medication Regime. 09/06/2022. 4. Hx of spinal stimulator. Not Functioning: Jeremy [name] states he called the company, at this time he doesn't want to have a new stimulator placed at this time. Continue to Monitor. 09/06/2022 5. COPD: On Continuous Oxygen Therapy @ 3 liters Nasal Cannula. Pulmonology Following. 09/06/2022 6. Depression: No complaints today. Continue: Cymbalta. Continue to monitor. 05/17//2024 7.. Sacral Decubitus Stage 3:  Continue adequate nutrition and weight shifting .Wound Care and ID Following. 09/06/2022.    F/U in 2 months

## 2022-09-05 NOTE — Progress Notes (Deleted)
Subjective:    Patient ID: Jeremy [name], male    DOB: [birth year], 72 y.o.   MRN: [id number]  HPI   Pain Inventory Average Pain {NUMBERS; 0-10:5044} Pain Right Now {NUMBERS; 0-10:5044} My pain is {PAIN DESCRIPTION:21022940}  In the last 24 hours, has pain interfered with the following? General activity {NUMBERS; 0-10:5044} Relation with others {NUMBERS; 0-10:5044} Enjoyment of life {NUMBERS; 0-10:5044} What TIME of day is your pain at its worst? {time of day:24191} Sleep (in general) {BHH GOOD/FAIR/POOR:22877}  Pain is worse with: {ACTIVITIES:21022942} Pain improves with: {PAIN IMPROVES WUJW:11914782} Relief from Meds: {NUMBERS; 0-10:5044}  Family History  Problem Relation Age of Onset   Heart disease Brother    Social History   Socioeconomic History   Marital status: Married    Spouse name: Not on file   Number of children: Not on file   Years of education: Not on file   Highest education level: Not on file  Occupational History   Not on file  Tobacco Use   Smoking status: Former    Packs/day: 0.50    Years: 6.00    Additional pack years: 0.00    Total pack years: 3.00    Types: Cigarettes    Quit date: 12/03/1990    Years since quitting: 31.7   Smokeless tobacco: Never  Vaping Use   Vaping Use: Never used  Substance and Sexual Activity   Alcohol use: Yes    Alcohol/week: 0.0 standard drinks of alcohol    Comment: rare   Drug use: Yes   Sexual activity: Not Currently  Other Topics Concern   Not on file  Social History Narrative   Not on file   Social Determinants of Health   Financial Resource Strain: Not on file  Food Insecurity: Not on file  Transportation Needs: Not on file  Physical Activity: Not on file  Stress: Not on file  Social Connections: Not on file   Past Surgical History:  Procedure Laterality Date   AMPUTATION Left 11/25/2012   Procedure: AMPUTATION BELOW KNEE;  Surgeon: Nadara Mustard, MD;  Location: [facility] OR;  Service: Orthopedics;   Laterality: Left;  Left Below Knee Amputation   APLIGRAFT PLACEMENT Left 07/31/2012   Procedure: Apply Skin Graft;  Surgeon: Nadara Mustard, MD;  Location: [facility] OR;  Service: Orthopedics;  Laterality: Left;   APPLICATION OF WOUND VAC Left 07/31/2012   Procedure: APPLICATION OF WOUND VAC;  Surgeon: Nadara Mustard, MD;  [facility];  Service: Orthopedics;  Laterality: Left;   arm surgery     back stimulator     CARDIAC CATHETERIZATION     Hx: of " around 1985 at [facility]"   CYST EXCISION     Hx: of   I & D EXTREMITY  03/13/2011   Procedure: IRRIGATION AND DEBRIDEMENT EXTREMITY;  Surgeon: Nadara Mustard, MD;  [facility];  Service: Orthopedics;  Laterality: Left;   I & D EXTREMITY Left 11/06/2012   Procedure: IRRIGATION AND DEBRIDEMENT EXTREMITY-Left lower ;  Surgeon: Nadara Mustard, MD;  [facility];  Service: Orthopedics;  Laterality: Left;  Left Leg Irrigation and Debridement, Place Skin Graft, Wound VAC, [place]   skin grafts     SKIN SPLIT GRAFT  03/13/2012   Procedure: SKIN GRAFT SPLIT THICKNESS;  Surgeon: Nadara Mustard, MD;  [facility];  Service: Orthopedics;  Laterality: Left;  Excisional Debridement Left Leg, Split Thickness Skin Graft, Wound VAC   stomach stapling  tummy tuck     VEIN LIGATION AND STRIPPING     Past Surgical History:  Procedure Laterality Date   AMPUTATION Left 11/25/2012   Procedure: AMPUTATION BELOW KNEE;  Surgeon: Nadara Mustard, MD;  [facility];  Service: Orthopedics;  Laterality: Left;  Left Below Knee Amputation   APLIGRAFT PLACEMENT Left 07/31/2012   Procedure: Apply Skin Graft;  Surgeon: Nadara Mustard, MD;  Location: [facility] OR;  Service: Orthopedics;  Laterality: Left;   APPLICATION OF WOUND VAC Left 07/31/2012   Procedure: APPLICATION OF WOUND VAC;  Surgeon: Nadara Mustard, MD;  [facility];  Service: Orthopedics;  Laterality: Left;   arm surgery     back stimulator     CARDIAC CATHETERIZATION     Hx: of " around 1985 at [facility]"   CYST EXCISION      Hx: of   I & D EXTREMITY  03/13/2011   Procedure: IRRIGATION AND DEBRIDEMENT EXTREMITY;  Surgeon: Nadara Mustard, MD;  [facility];  Service: Orthopedics;  Laterality: Left;   I & D EXTREMITY Left 11/06/2012   Procedure: IRRIGATION AND DEBRIDEMENT EXTREMITY-Left lower ;  Surgeon: Nadara Mustard, MD;  [facility];  Service: Orthopedics;  Laterality: Left;  Left Leg Irrigation and Debridement, Place Skin Graft, Wound VAC, Ther[place]in   skin grafts     SKIN SPLIT GRAFT  03/13/2012   Procedure: SKIN GRAFT SPLIT THICKNESS;  Surgeon: Nadara Mustard, MD;  [facility];  Service: Orthopedics;  Laterality: Left;  Excisional Debridement Left Leg, Split Thickness Skin Graft, Wound VAC   stomach stapling     tummy tuck     VEIN LIGATION AND STRIPPING     Past Medical History:  Diagnosis Date   Anemia    Asthma    Cancer (HCC)    hx basal cell skin cancer   CHF (congestive heart failure) (HCC)    does not see a cardiologist   COPD (chronic obstructive pulmonary disease) (HCC)    3L continuous   Diabetes mellitus    not on medication   DVT (deep venous thrombosis) (HCC)    LLE DVT '80's   GERD (gastroesophageal reflux disease)    Headache(784.0)    sinus headaches   Leg pain    Peripheral neuropathy    Peripheral vascular disease (HCC)    Psoriasis    Shortness of breath    Sleep apnea    uses bipap, sleep study done in [place] 2 years ago, dr Cala Bradford byrd   There were no vitals taken for this visit.  Opioid Risk Score:   Fall Risk Score:  `1  Depression screen PHQ 2/9     07/09/2022    2:25 PM 04/10/2022    1:00 PM 12/18/2021    1:52 PM 10/03/2021   12:41 PM 03/05/2021   12:57 PM 08/28/2020    1:21 PM 06/22/2020   12:44 PM  Depression screen PHQ 2/9  Decreased Interest 0 0 0 0 0 0 0  Down, Depressed, Hopeless 0 0 0 0 0 0 0  PHQ - 2 Score 0 0 0 0 0 0 0    Review of Systems     Objective:   Physical Exam        Assessment & Plan:

## 2022-09-06 ENCOUNTER — Telehealth: Payer: Self-pay | Admitting: *Deleted

## 2022-09-06 NOTE — Telephone Encounter (Signed)
Final warning letter mailed to Mr Jeremy [name] re having oxycodone and hydrocodone in his drug test. This is his second offense. He is not able to access his myChart so letter is mailed. [name] discussed this with him at his appointment on 09/05/22.

## 2022-10-02 ENCOUNTER — Telehealth: Payer: Self-pay | Admitting: *Deleted

## 2022-10-02 NOTE — Telephone Encounter (Signed)
FYI: patient calling about date 10/05/22 do not fill until note on Butrans. Informed patient last dispense 09/05/22.  His insurance will not cover before that date. He states his pharmacy doesn't deliver on Saturday and he will not get until 6/17. He is calling insurance to see if they will pay 10/04/22 then if he can get pharmacy over-ride to fill from you.

## 2022-10-30 ENCOUNTER — Encounter: Payer: Medicare Other | Attending: Physical Medicine & Rehabilitation | Admitting: Registered Nurse

## 2022-10-30 ENCOUNTER — Encounter: Payer: Self-pay | Admitting: Registered Nurse

## 2022-10-30 VITALS — BP 152/66 | HR 60

## 2022-10-30 DIAGNOSIS — Z79891 Long term (current) use of opiate analgesic: Secondary | ICD-10-CM | POA: Diagnosis present

## 2022-10-30 DIAGNOSIS — G8929 Other chronic pain: Secondary | ICD-10-CM | POA: Insufficient documentation

## 2022-10-30 DIAGNOSIS — M62838 Other muscle spasm: Secondary | ICD-10-CM | POA: Insufficient documentation

## 2022-10-30 DIAGNOSIS — G546 Phantom limb syndrome with pain: Secondary | ICD-10-CM | POA: Diagnosis present

## 2022-10-30 DIAGNOSIS — Z5181 Encounter for therapeutic drug level monitoring: Secondary | ICD-10-CM | POA: Diagnosis present

## 2022-10-30 DIAGNOSIS — G894 Chronic pain syndrome: Secondary | ICD-10-CM | POA: Diagnosis present

## 2022-10-30 DIAGNOSIS — L89153 Pressure ulcer of sacral region, stage 3: Secondary | ICD-10-CM | POA: Insufficient documentation

## 2022-10-30 DIAGNOSIS — M545 Low back pain, unspecified: Secondary | ICD-10-CM | POA: Diagnosis present

## 2022-10-30 MED ORDER — BUPRENORPHINE 10 MCG/HR TD PTWK: 1.0000 | MEDICATED_PATCH | TRANSDERMAL | 0 refills | Status: DC

## 2022-10-30 MED ORDER — OXYCODONE HCL 15 MG PO TABS: 15.0000 mg | ORAL_TABLET | Freq: Four times a day (QID) | ORAL | 0 refills | Status: DC | PRN

## 2022-10-30 NOTE — Progress Notes (Signed)
Subjective:    Patient ID: Jeremy [name], male    DOB: October 29, 1950, 72 y.o.   MRN: [id number]  HPI: Jeremy [name] is a 72 y.o. male who returns for follow up appointment for chronic pain and medication refill. He states his pain is located in his lower back occassionally, sacrum pain ( decubitus) and phantom pain occasionally.[name] He rates his pain 3. His current exercise regime is  performing stretching exercises.  [name] Morphine equivalent is 90.00 MME.   Last Oral Swab was Performe on 07/09/2022, see note for details.     Pain Inventory Average Pain 8 Pain Right Now 3 My pain is sharp, stabbing, and aching  In the last 24 hours, has pain interfered with the following? General activity 7 Relation with others 7 Enjoyment of life 9 What TIME of day is your pain at its worst? morning  Sleep (in general) Poor  Pain is worse with: inactivity Pain improves with: medication Relief from Meds: 6  Family History  Problem Relation Age of Onset   Heart disease Brother    Social History   Socioeconomic History   Marital status: Married    Spouse name: Not on file   Number of children: Not on file   Years of education: Not on file   Highest education level: Not on file  Occupational History   Not on file  Tobacco Use   Smoking status: Former    Packs/day: 0.50    Years: 6.00    Additional pack years: 0.00    Total pack years: 3.00    Types: Cigarettes    Quit date: 12/03/1990    Years since quitting: 31.9   Smokeless tobacco: Never  Vaping Use   Vaping Use: Never used  Substance and Sexual Activity   Alcohol use: Yes    Alcohol/week: 0.0 standard drinks of alcohol    Comment: rare   Drug use: Yes   Sexual activity: Not Currently  Other Topics Concern   Not on file  Social History Narrative   Not on file   Social Determinants of Health   Financial Resource Strain: Not on file  Food Insecurity: Not on file  Transportation Needs: Not on file  Physical Activity: Not on  file  Stress: Not on file  Social Connections: Not on file   Past Surgical History:  Procedure Laterality Date   AMPUTATION Left 11/25/2012   Procedure: AMPUTATION BELOW KNEE;  Surgeon: Nadara Mustard, MD;  Location: [facility] OR;  Service: Orthopedics;  Laterality: Left;  Left Below Knee Amputation   APLIGRAFT PLACEMENT Left 07/31/2012   Procedure: Apply Skin Graft;  Surgeon: Nadara Mustard, MD;  Location: [facility] OR;  Service: Orthopedics;  Laterality: Left;   APPLICATION OF WOUND VAC Left 07/31/2012   Procedure: APPLICATION OF WOUND VAC;  Surgeon: Nadara Mustard, MD;  Location: [facility] OR;  Service: Orthopedics;  Laterality: Left;   arm surgery     back stimulator     CARDIAC CATHETERIZATION     Hx: of " around 1985 at [facility]"   CYST EXCISION     Hx: of   I & D EXTREMITY  03/13/2011   Procedure: IRRIGATION AND DEBRIDEMENT EXTREMITY;  Surgeon: Nadara Mustard, MD;  Location: [facility] OR;  Service: Orthopedics;  Laterality: Left;   I & D EXTREMITY Left 11/06/2012   Procedure: IRRIGATION AND DEBRIDEMENT EXTREMITY-Left lower ;  Surgeon: Nadara Mustard, MD;  Location: [facility] OR;  Service: Orthopedics;  Laterality:  Left;  Left Leg Irrigation and Debridement, Place Skin Graft, Wound VAC, Theraskin   skin grafts     SKIN SPLIT GRAFT  03/13/2012   Procedure: SKIN GRAFT SPLIT THICKNESS;  Surgeon: Nadara Mustard, MD;  Location: [facility] OR;  Service: Orthopedics;  Laterality: Left;  Excisional Debridement Left Leg, Split Thickness Skin Graft, Wound VAC   stomach stapling     tummy tuck     VEIN LIGATION AND STRIPPING     Past Surgical History:  Procedure Laterality Date   AMPUTATION Left 11/25/2012   Procedure: AMPUTATION BELOW KNEE;  Surgeon: Nadara Mustard, MD;  Location: [facility] OR;  Service: Orthopedics;  Laterality: Left;  Left Below Knee Amputation   APLIGRAFT PLACEMENT Left 07/31/2012   Procedure: Apply Skin Graft;  Surgeon: Nadara Mustard, MD;  Location: [facility] OR;  Service: Orthopedics;  Laterality: Left;   APPLICATION OF WOUND VAC Left  07/31/2012   Procedure: APPLICATION OF WOUND VAC;  Surgeon: Nadara Mustard, MD;  Location: [facility] OR;  Service: Orthopedics;  Laterality: Left;   arm surgery     back stimulator     CARDIAC CATHETERIZATION     Hx: of " around 1985 at [facility]"   CYST EXCISION     Hx: of   I & D EXTREMITY  03/13/2011   Procedure: IRRIGATION AND DEBRIDEMENT EXTREMITY;  Surgeon: Nadara Mustard, MD;  Location: [facility] OR;  Service: Orthopedics;  Laterality: Left;   I & D EXTREMITY Left 11/06/2012   Procedure: IRRIGATION AND DEBRIDEMENT EXTREMITY-Left lower ;  Surgeon: Nadara Mustard, MD;  Location: [facility] OR;  Service: Orthopedics;  Laterality: Left;  Left Leg Irrigation and Debridement, Place Skin Graft, Wound VAC, Theraskin   skin grafts     SKIN SPLIT GRAFT  03/13/2012   Procedure: SKIN GRAFT SPLIT THICKNESS;  Surgeon: Nadara Mustard, MD;  Location: [facility] OR;  Service: Orthopedics;  Laterality: Left;  Excisional Debridement Left Leg, Split Thickness Skin Graft, Wound VAC   stomach stapling     tummy tuck     VEIN LIGATION AND STRIPPING     Past Medical History:  Diagnosis Date   Anemia    Asthma    Cancer (HCC)    hx basal cell skin cancer   CHF (congestive heart failure) (HCC)    does not see a cardiologist   COPD (chronic obstructive pulmonary disease) (HCC)    3L continuous   Diabetes mellitus    not on medication   DVT (deep venous thrombosis) (HCC)    LLE DVT '80's   GERD (gastroesophageal reflux disease)    Headache(784.0)    sinus headaches   Leg pain    Peripheral neuropathy    Peripheral vascular disease (HCC)    Psoriasis    Shortness of breath    Sleep apnea    uses bipap, sleep study done in [place] 2 years ago, dr Cala Bradford byrd   BP (!) 165/57   Pulse 62   SpO2 97%   Opioid Risk Score:   Fall Risk Score:  `1  Depression screen PHQ 2/9     09/05/2022    1:02 PM 09/05/2022   12:59 PM 07/09/2022    2:25 PM 04/10/2022    1:00 PM 12/18/2021    1:52 PM 10/03/2021   12:41 PM 03/05/2021   12:57 PM   Depression screen PHQ 2/9  Decreased Interest 0 0 0 0 0 0 0  Down, Depressed, Hopeless 0 0 0 0 0  0 0  PHQ - 2 Score 0 0 0 0 0 0 0     Review of Systems  Musculoskeletal:  Positive for back pain.  All other systems reviewed and are negative.     Objective:   Physical Exam Vitals and nursing note reviewed.  Constitutional:      Appearance: Normal appearance.  Cardiovascular:     Rate and Rhythm: Normal rate and regular rhythm.     Pulses: Normal pulses.     Heart sounds: Normal heart sounds.  Pulmonary:     Effort: Pulmonary effort is normal.     Breath sounds: Normal breath sounds.     Comments: Continuous Oxygen @3  liters nasal cannula  Musculoskeletal:     Cervical back: Normal range of motion and neck supple.     Comments: Normal Muscle Bulk and Muscle Testing Reveals:  Upper Extremities: Full ROM and Muscle Strength 5/5 Lower Extremities: Right: Full ROM and Muscle Strength 5/5 Left Lower Extremity: BKA  Arrived in wheelchair     Skin:    General: Skin is warm and dry.  Neurological:     Mental Status: He is alert and oriented to person, place, and time.  Psychiatric:        Mood and Affect: Mood normal.        Behavior: Behavior normal.         Assessment & Plan:  1.Left BKA/ [place] Pain : Continue current medication regimen: Continue current regimen until 10/30/2022, Refilled: oxycodone 15 mg one tablet 4 times a day as needed # 120 and  Butrans 10 mcg weekly #4   Second script sent for the following month. We will continue the opioid monitoring program, this consists of regular clinic visits, examinations, urine drug screen, pill counts as well as use of [place] Controlled Substance Reporting system. A 12 month History has been reviewed on the [place] Controlled Substance Reporting System on 10/30/2022.Continue: Topamax 50 mg one tablet at HS and Amitriptyline.. 2. Chronic Bilateral Low Back Pain without Sciatica: Continue HEP as Tolerated.  Continue to Monitor. 10/30/2022. 3.Chronic  pain syndrome: Continue Current medication Regime. 10/30/2022. 4. Hx of spinal stimulator. Not Functioning: Mr. Ehrmann states he called the company, at this time he doesn't want to have a new stimulator placed at this time. Continue to Monitor. 10/30/2022 5. COPD: On Continuous Oxygen Therapy @ 3 liters Nasal Cannula. Pulmonology Following. 10/30/2022 6. Depression: No complaints today. Continue: Cymbalta. Continue to monitor. 07/10//2024 7.. Sacral Decubitus Stage 3:  Continue adequate nutrition and weight shifting .Wound Care and ID Following. 10/30/2022.    F/U in 2 months

## 2022-12-19 ENCOUNTER — Other Ambulatory Visit: Payer: Self-pay | Admitting: Registered Nurse

## 2022-12-26 ENCOUNTER — Telehealth: Payer: Self-pay | Admitting: Registered Nurse

## 2022-12-26 ENCOUNTER — Other Ambulatory Visit: Payer: Self-pay | Admitting: Registered Nurse

## 2022-12-26 MED ORDER — BUPRENORPHINE 10 MCG/HR TD PTWK: 1.0000 | MEDICATED_PATCH | TRANSDERMAL | 0 refills | Status: DC

## 2022-12-26 NOTE — Telephone Encounter (Signed)
PMP was Reviewed. Butrans e-scribed to pharmacy.  Jeremy [name] has a scheduled appointment.

## 2022-12-26 NOTE — Telephone Encounter (Signed)
Jeremy [name] stated he is on his last pain patch. He will need a refill  before next Tuesday.

## 2023-01-02 ENCOUNTER — Encounter: Payer: Medicare Other | Attending: Physical Medicine & Rehabilitation | Admitting: Registered Nurse

## 2023-01-02 ENCOUNTER — Encounter: Payer: Self-pay | Admitting: Registered Nurse

## 2023-01-02 VITALS — BP 175/64 | HR 58 | Ht 70.0 in | Wt 232.0 lb

## 2023-01-02 DIAGNOSIS — Z5181 Encounter for therapeutic drug level monitoring: Secondary | ICD-10-CM | POA: Insufficient documentation

## 2023-01-02 DIAGNOSIS — M545 Low back pain, unspecified: Secondary | ICD-10-CM | POA: Diagnosis present

## 2023-01-02 DIAGNOSIS — G894 Chronic pain syndrome: Secondary | ICD-10-CM | POA: Diagnosis not present

## 2023-01-02 DIAGNOSIS — S88119S Complete traumatic amputation at level between knee and ankle, unspecified lower leg, sequela: Secondary | ICD-10-CM | POA: Diagnosis present

## 2023-01-02 DIAGNOSIS — I10 Essential (primary) hypertension: Secondary | ICD-10-CM | POA: Insufficient documentation

## 2023-01-02 DIAGNOSIS — Z79891 Long term (current) use of opiate analgesic: Secondary | ICD-10-CM | POA: Diagnosis present

## 2023-01-02 DIAGNOSIS — G8929 Other chronic pain: Secondary | ICD-10-CM | POA: Diagnosis present

## 2023-01-02 DIAGNOSIS — L89153 Pressure ulcer of sacral region, stage 3: Secondary | ICD-10-CM | POA: Insufficient documentation

## 2023-01-02 DIAGNOSIS — G546 Phantom limb syndrome with pain: Secondary | ICD-10-CM | POA: Diagnosis present

## 2023-01-02 MED ORDER — BUPRENORPHINE 10 MCG/HR TD PTWK: 1.0000 | MEDICATED_PATCH | TRANSDERMAL | 0 refills | Status: DC

## 2023-01-02 MED ORDER — OXYCODONE HCL 15 MG PO TABS: 15.0000 mg | ORAL_TABLET | Freq: Four times a day (QID) | ORAL | 0 refills | Status: DC | PRN

## 2023-01-02 NOTE — Progress Notes (Signed)
Subjective:    Patient ID: Jeremy [name], male    DOB: 1951-03-24, 72 y.o.   MRN: [id number] HPI: Jeremy [name] is a 72 y.o. male who returns for follow up appointment for chronic pain and medication refill. He states his pain is located in his lower back pain and phantom pain. He rates his pain 3. current exercise regime is walking and performing stretching exercises.  Jeremy [name] arrived to office with uncontrolled hypertension, blood pressure was re-checked. Jeremy [name] reports he is compliant with his anti-hypertensive medications. He refuses ED or Urgent Care evaluation. He will check his blood pressure when he get home and call office with reading, he was instructed to keep blood pressure log and F/U with his PCP. He verbalizes understanding.   Jeremy [name] Morphine equivalent is 90.00 MME.   Oral Swab was Performed today.     Pain Inventory Average Pain 8 Pain Right Now 3 My pain is constant, sharp, stabbing, and aching  In the last 24 hours, has pain interfered with the following? General activity 7 Relation with others 5 Enjoyment of life 3 What TIME of day is your pain at its worst? evening Sleep (in general) Poor  Pain is worse with: bending and inactivity Pain improves with: medication Relief from Meds: 5  Family History  Problem Relation Age of Onset   Heart disease Brother    Social History   Socioeconomic History   Marital status: Married    Spouse name: Not on file   Number of children: Not on file   Years of education: Not on file   Highest education level: Not on file  Occupational History   Not on file  Tobacco Use   Smoking status: Former    Current packs/day: 0.00    Average packs/day: 0.5 packs/day for 6.0 years (3.0 ttl pk-yrs)    Types: Cigarettes    Start date: 12/02/1984    Quit date: 12/03/1990    Years since quitting: 32.1   Smokeless tobacco: Never  Vaping Use   Vaping status: Never Used  Substance and Sexual Activity   Alcohol use: Yes     Alcohol/week: 0.0 standard drinks of alcohol    Comment: rare   Drug use: Yes   Sexual activity: Not Currently  Other Topics Concern   Not on file  Social History Narrative   Not on file   Social Determinants of Health   Financial Resource Strain: Not on file  Food Insecurity: Not on file  Transportation Needs: Not on file  Physical Activity: Not on file  Stress: Not on file  Social Connections: Not on file   Past Surgical History:  Procedure Laterality Date   AMPUTATION Left 11/25/2012   Procedure: AMPUTATION BELOW KNEE;  Surgeon: Jeremy Mustard, MD;  Location: [facility] OR;  Service: Orthopedics;  Laterality: Left;  Left Below Knee Amputation   APLIGRAFT PLACEMENT Left 07/31/2012   Procedure: Apply Skin Graft;  Surgeon: Jeremy Mustard, MD;  Location: [facility] OR;  Service: Orthopedics;  Laterality: Left;   APPLICATION OF WOUND VAC Left 07/31/2012   Procedure: APPLICATION OF WOUND VAC;  Surgeon: Jeremy Mustard, MD;  Location: [facility] OR;  Service: Orthopedics;  Laterality: Left;   arm surgery     back stimulator     CARDIAC CATHETERIZATION     Hx: of " around 1985 at [facility]"   CYST EXCISION     Hx: of   I & D EXTREMITY  03/13/2011  Procedure: IRRIGATION AND DEBRIDEMENT EXTREMITY;  Surgeon: Jeremy Mustard, MD;  Location: [facility] OR;  Service: Orthopedics;  Laterality: Left;   I & D EXTREMITY Left 11/06/2012   Procedure: IRRIGATION AND DEBRIDEMENT EXTREMITY-Left lower ;  Surgeon: Jeremy Mustard, MD;  Location: [facility] OR;  Service: Orthopedics;  Laterality: Left;  Left Leg Irrigation and Debridement, Place Skin Graft, Wound VAC, Theraskin   skin grafts     SKIN SPLIT GRAFT  03/13/2012   Procedure: SKIN GRAFT SPLIT THICKNESS;  Surgeon: Jeremy Mustard, MD;  Location: [facility] OR;  Service: Orthopedics;  Laterality: Left;  Excisional Debridement Left Leg, Split Thickness Skin Graft, Wound VAC   stomach stapling     tummy tuck     VEIN LIGATION AND STRIPPING     Past Surgical History:  Procedure Laterality Date    AMPUTATION Left 11/25/2012   Procedure: AMPUTATION BELOW KNEE;  Surgeon: Jeremy Mustard, MD;  Location: [facility] OR;  Service: Orthopedics;  Laterality: Left;  Left Below Knee Amputation   APLIGRAFT PLACEMENT Left 07/31/2012   Procedure: Apply Skin Graft;  Surgeon: Jeremy Mustard, MD;  Location: [facility] OR;  Service: Orthopedics;  Laterality: Left;   APPLICATION OF WOUND VAC Left 07/31/2012   Procedure: APPLICATION OF WOUND VAC;  Surgeon: Jeremy Mustard, MD;  Location: [facility] OR;  Service: Orthopedics;  Laterality: Left;   arm surgery     back stimulator     CARDIAC CATHETERIZATION     Hx: of " around 1985 at [facility]"   CYST EXCISION     Hx: of   I & D EXTREMITY  03/13/2011   Procedure: IRRIGATION AND DEBRIDEMENT EXTREMITY;  Surgeon: Jeremy Mustard, MD;  Location: [facility] OR;  Service: Orthopedics;  Laterality: Left;   I & D EXTREMITY Left 11/06/2012   Procedure: IRRIGATION AND DEBRIDEMENT EXTREMITY-Left lower ;  Surgeon: Jeremy Mustard, MD;  Location: [facility] OR;  Service: Orthopedics;  Laterality: Left;  Left Leg Irrigation and Debridement, Place Skin Graft, Wound VAC, Theraskin   skin grafts     SKIN SPLIT GRAFT  03/13/2012   Procedure: SKIN GRAFT SPLIT THICKNESS;  Surgeon: Jeremy Mustard, MD;  Location: [facility] OR;  Service: Orthopedics;  Laterality: Left;  Excisional Debridement Left Leg, Split Thickness Skin Graft, Wound VAC   stomach stapling     tummy tuck     VEIN LIGATION AND STRIPPING     Past Medical History:  Diagnosis Date   Anemia    Asthma    Cancer (HCC)    hx basal cell skin cancer   CHF (congestive heart failure) (HCC)    does not see a cardiologist   COPD (chronic obstructive pulmonary disease) (HCC)    3L continuous   Diabetes mellitus    not on medication   DVT (deep venous thrombosis) (HCC)    LLE DVT '80's   GERD (gastroesophageal reflux disease)    Headache(784.0)    sinus headaches   Leg pain    Peripheral neuropathy    Peripheral vascular disease (HCC)    Psoriasis    Shortness of breath     Sleep apnea    uses bipap, sleep study done in [place] 2 years ago, dr Jeremy [name] byrd   There were no vitals taken for this visit.  Opioid Risk Score:   Fall Risk Score:  `1  Depression screen [facility] 2/9     09/05/2022    1:02 PM 09/05/2022   12:59 PM 07/09/2022  2:25 PM 04/10/2022    1:00 PM 12/18/2021    1:52 PM 10/03/2021   12:41 PM 03/05/2021   12:57 PM  Depression screen PHQ 2/9  Decreased Interest 0 0 0 0 0 0 0  Down, Depressed, Hopeless 0 0 0 0 0 0 0  PHQ - 2 Score 0 0 0 0 0 0 0     Review of Systems  Musculoskeletal:  Positive for back pain.       LT ankle pain  All other systems reviewed and are negative.      Objective:   Physical Exam Vitals and nursing note reviewed.  Constitutional:      Appearance: Normal appearance.  Cardiovascular:     Rate and Rhythm: Normal rate and regular rhythm.     Pulses: Normal pulses.     Heart sounds: Normal heart sounds.  Pulmonary:     Effort: Pulmonary effort is normal.     Breath sounds: Normal breath sounds.     Comments: Continuous Oxygen at 3 Liters Nasal Cannula  Musculoskeletal:     Cervical back: Normal range of motion and neck supple.     Comments: Normal Muscle Bulk and Muscle Testing Reveals:  Upper Extremities: Full ROM and Muscle Strength 5/5  Lower Extremities: Right: Full ROM and Muscle Strength 5/5 Left : BKA Arrived in wheelchair     Skin:    General: Skin is warm and dry.  Neurological:     Mental Status: He is alert and oriented to person, place, and time.  Psychiatric:        Mood and Affect: Mood normal.        Behavior: Behavior normal.         Assessment & Plan:  1.Left BKA/ [place] Pain : Continue current medication regimen: Continue current regimen until 01/02/2023, Refilled: oxycodone 15 mg one tablet 4 times a day as needed # 120 and  Butrans 10 mcg weekly #4   Second script sent for the following month. We will continue the opioid monitoring program, this consists of regular clinic  visits, examinations, urine drug screen, pill counts as well as use of [place] Controlled Substance Reporting system. A 12 month History has been reviewed on the [place] Controlled Substance Reporting System on 01/02/2023.Continue: Topamax 50 mg one tablet at HS and Amitriptyline.. 2. Chronic Bilateral Low Back Pain without Sciatica: Continue HEP as Tolerated. Continue to Monitor. 01/02/2023. 3.Chronic  pain syndrome: Continue Current medication Regime. 01/02/2023. 4. Hx of spinal stimulator. Not Functioning: Mr. Swaner states he called the company, at this time he doesn't want to have a new stimulator placed at this time. Continue to Monitor. 01/02/2023 5. COPD: On Continuous Oxygen Therapy @ 3 liters Nasal Cannula. Pulmonology Following. 01/02/2023 6. Depression: No complaints today. Continue: Cymbalta. Continue to monitor. 09/12//2024 7.. Sacral Decubitus Stage 3:  Continue adequate nutrition and weight shifting .Wound Care and ID Following. 01/02/2023. 8. Uncontrolled Hypertension: Blood pressure was re-checked. Mr. Gilpatrick reports he is compliant with anti- hypertensive medication. He refuses ED or Urgent Care Evaluation he states he will check his blood pressure when he goes home and call office with reading. He was instructed to keep blood pressure log and F/u with his PCP. He verbalizes understanding.     F/U in 2 months

## 2023-01-02 NOTE — Patient Instructions (Signed)
My- Chart:   [phone]  

## 2023-02-11 ENCOUNTER — Telehealth: Payer: Self-pay

## 2023-02-11 NOTE — Telephone Encounter (Signed)
Mr. Payant is a current in-patient at  [facility] in [place].   His wife stated [facility] are treating his wound infection. He is about to transferred to a skilled care facility. Mr. Linnemann has requested you to call him to discuss his medication management and appointments.   ( Please call [phone]). Patient has been advised that [facility] the SNF will handle is pain medication. While he is under their care.

## 2023-02-12 NOTE — Telephone Encounter (Signed)
Return Jeremy [name] call,  He was instructed his medication will be prescribed by [facility] SNF, he verbalizes understanding.

## 2023-03-05 ENCOUNTER — Encounter: Payer: Medicare Other | Admitting: Registered Nurse

## 2023-04-01 ENCOUNTER — Other Ambulatory Visit: Payer: Self-pay

## 2023-04-02 MED ORDER — TIZANIDINE HCL 2 MG PO TABS: ORAL_TABLET | ORAL | 1 refills | Status: DC

## 2023-04-02 NOTE — Telephone Encounter (Signed)
[facility] , the pharmacist states the Tizanidine 4 mg, is on back order. They have Tizanidine 2 mg in stock. Tizanidine 2 mg prescription sent to pharmacy. Call placed to Jeremy [name], he is aware of the above.  Jeremy [name] also states he is having Transportation issue, he was discharged from Rehab facility, last week. He will be scheduled in January, he verbalizes understanding. He was instructed to call office on 04/14/2023, regarding his Oxycodone. He verbalizes understanding.

## 2023-04-03 ENCOUNTER — Encounter: Payer: Self-pay | Admitting: *Deleted

## 2023-04-03 ENCOUNTER — Telehealth: Payer: Self-pay | Admitting: Registered Nurse

## 2023-04-03 MED ORDER — OXYCODONE HCL 15 MG PO TABS: 15.0000 mg | ORAL_TABLET | Freq: Four times a day (QID) | ORAL | 0 refills | Status: DC | PRN

## 2023-04-03 NOTE — Addendum Note (Signed)
Addended by: Jones Bales on: 04/03/2023 11:46 AM   Modules accepted: Orders

## 2023-04-03 NOTE — Telephone Encounter (Signed)
Call placed to [facility],  [name] was recently discharge, left message for pharmacist, need to know when his las Butrans and Oxycodone was filled.  SNF don't report to the PMP.  Call placed to [name] regarding the above. Ms. Jeremy [name] answered the phone we discussed the above, she verbalizes understanding.

## 2023-04-03 NOTE — Telephone Encounter (Signed)
Mr Jeremy [name] was discharged on the 8th and they do not send remainder of rx home with patients. His Butrans was filled 03/10/23 and his oxy ir 10 mg was filled 03/25/23.

## 2023-04-03 NOTE — Telephone Encounter (Signed)
Opened in error

## 2023-04-11 ENCOUNTER — Telehealth: Payer: Self-pay

## 2023-04-11 NOTE — Telephone Encounter (Signed)
Pain medication was sent last week. No other phone contact or Mychart has been created by [name] NP.  Patient informed.

## 2023-04-30 NOTE — Progress Notes (Signed)
 Subjective:    Patient ID: Jeremy [name], male    DOB: 1950-06-30, 73 y.o.   MRN: [id number]  HPI: Jeremy [name] is a 73 y.o. male who returns for follow up appointment for chronic pain and medication refill. He states his pain is located in his sacrum.  ( Sacral decubitus ). He rates his pain 5.. His current exercise regime is performing stretching exercises.  [name] Morphine equivalent is 90.00 MME.   Oral Swab was Performed today.      Pain Inventory Average Pain 7 Pain Right Now 5 My pain is constant, sharp, stabbing, and aching  In the last 24 hours, has pain interfered with the following? General activity 8 Relation with others 8 Enjoyment of life 8 What TIME of day is your pain at its worst? daytime Sleep (in general) Poor  Pain is worse with: inactivity Pain improves with: medication Relief from Meds: 7  Family History  Problem Relation Age of Onset   Heart disease Brother    Social History   Socioeconomic History   Marital status: Married    Spouse name: Not on file   Number of children: Not on file   Years of education: Not on file   Highest education level: Not on file  Occupational History   Not on file  Tobacco Use   Smoking status: Former    Current packs/day: 0.00    Average packs/day: 0.5 packs/day for 6.0 years (3.0 ttl pk-yrs)    Types: Cigarettes    Start date: 12/02/1984    Quit date: 12/03/1990    Years since quitting: 32.4   Smokeless tobacco: Never  Vaping Use   Vaping status: Never Used  Substance and Sexual Activity   Alcohol  use: Yes    Alcohol /week: 0.0 standard drinks of alcohol     Comment: rare   Drug use: Yes   Sexual activity: Not Currently  Other Topics Concern   Not on file  Social History Narrative   Not on file   Social Drivers of Health   Financial Resource Strain: Not on file  Food Insecurity: Not on file  Transportation Needs: Not on file  Physical Activity: Not on file  Stress: Not on file  Social Connections:  Not on file   Past Surgical History:  Procedure Laterality Date   AMPUTATION Left 11/25/2012   Procedure: AMPUTATION BELOW KNEE;  Surgeon: Jerona LULLA Sage, MD;  Location: [facility] OR;  Service: Orthopedics;  Laterality: Left;  Left Below Knee Amputation   APLIGRAFT PLACEMENT Left 07/31/2012   Procedure: Apply Skin Graft;  Surgeon: Jerona LULLA Sage, MD;  Location: [facility] OR;  Service: Orthopedics;  Laterality: Left;   APPLICATION OF WOUND VAC Left 07/31/2012   Procedure: APPLICATION OF WOUND VAC;  Surgeon: Jerona LULLA Sage, MD;  Location: [facility] OR;  Service: Orthopedics;  Laterality: Left;   arm surgery     back stimulator     CARDIAC CATHETERIZATION     Hx: of  around 1985 at [facility]   CYST EXCISION     Hx: of   I & D EXTREMITY  03/13/2011   Procedure: IRRIGATION AND DEBRIDEMENT EXTREMITY;  Surgeon: Jerona LULLA Sage, MD;  Location: [facility];  Service: Orthopedics;  Laterality: Left;   I & D EXTREMITY Left 11/06/2012   Procedure: IRRIGATION AND DEBRIDEMENT EXTREMITY-Left lower ;  Surgeon: Jerona LULLA Sage, MD;  Location: [facility];  Service: Orthopedics;  Laterality: Left;  Left Leg Irrigation and Debridement, Place Skin Graft,  Wound VAC, Theraskin   skin grafts     SKIN SPLIT GRAFT  03/13/2012   Procedure: SKIN GRAFT SPLIT THICKNESS;  Surgeon: Jerona LULLA Sage, MD;  Location: [facility];  Service: Orthopedics;  Laterality: Left;  Excisional Debridement Left Leg, Split Thickness Skin Graft, Wound VAC   stomach stapling     tummy tuck     VEIN LIGATION AND STRIPPING     Past Surgical History:  Procedure Laterality Date   AMPUTATION Left 11/25/2012   Procedure: AMPUTATION BELOW KNEE;  Surgeon: Jerona LULLA Sage, MD;  Location: [facility];  Service: Orthopedics;  Laterality: Left;  Left Below Knee Amputation   APLIGRAFT PLACEMENT Left 07/31/2012   Procedure: Apply Skin Graft;  Surgeon: Jerona LULLA Sage, MD;  Location: [facility] OR;  Service: Orthopedics;  Laterality: Left;   APPLICATION OF WOUND VAC Left 07/31/2012   Procedure: APPLICATION OF WOUND VAC;   Surgeon: Jerona LULLA Sage, MD;  Location: [facility];  Service: Orthopedics;  Laterality: Left;   arm surgery     back stimulator     CARDIAC CATHETERIZATION     Hx: of  around 1985 at [facility]   CYST EXCISION     Hx: of   I & D EXTREMITY  03/13/2011   Procedure: IRRIGATION AND DEBRIDEMENT EXTREMITY;  Surgeon: Jerona LULLA Sage, MD;  [facility]: [facility];  Service: Orthopedics;  Laterality: Left;   I & D EXTREMITY Left 11/06/2012   Procedure: IRRIGATION AND DEBRIDEMENT EXTREMITY-Left lower ;  Surgeon: Jerona LULLA Sage, MD;  [facility]: [facility];  Service: Orthopedics;  Laterality: Left;  Left Leg Irrigation and Debridement, Place Skin Graft, Wound VAC, Theraskin   skin grafts     SKIN SPLIT GRAFT  03/13/2012   Procedure: SKIN GRAFT SPLIT THICKNESS;  Surgeon: Jerona LULLA Sage, MD;  Location: [facility];  Service: Orthopedics;  Laterality: Left;  Excisional Debridement Left Leg, Split Thickness Skin Graft, Wound VAC   stomach stapling     tummy tuck     VEIN LIGATION AND STRIPPING     Past Medical History:  Diagnosis Date   Anemia    Asthma    Cancer (HCC)    hx basal cell skin cancer   CHF (congestive heart failure) (HCC)    does not see a cardiologist   COPD (chronic obstructive pulmonary disease) (HCC)    3L continuous   Diabetes mellitus    not on medication   DVT (deep venous thrombosis) (HCC)    LLE DVT '80's   GERD (gastroesophageal reflux disease)    Headache(784.0)    sinus headaches   Leg pain    Peripheral neuropathy    Peripheral vascular disease (HCC)    Psoriasis    Shortness of breath    Sleep apnea    uses bipap, sleep study done in [place] 2 years ago, dr suzen byrd   There were no vitals taken for this visit.  Opioid Risk Score:   Fall Risk Score:  `1  Depression screen PHQ 2/9     01/02/2023   12:52 PM 09/05/2022    1:02 PM 09/05/2022   12:59 PM 07/09/2022    2:25 PM 04/10/2022    1:00 PM 12/18/2021    1:52 PM 10/03/2021   12:41 PM  Depression screen PHQ 2/9  Decreased Interest  0 0 0 0 0 0 0  Down, Depressed, Hopeless 0 0 0 0 0 0 0  PHQ - 2 Score 0 0 0 0 0 0 0  Review of Systems  Musculoskeletal:  Positive for back pain and gait problem.       Left stump pain  All other systems reviewed and are negative.      Objective:   Physical Exam Vitals and nursing note reviewed.  Constitutional:      Appearance: Normal appearance.  Cardiovascular:     Rate and Rhythm: Normal rate and regular rhythm.     Pulses: Normal pulses.     Heart sounds: Normal heart sounds.  Musculoskeletal:     Comments: Normal Muscle Bulk and Muscle Testing Reveals:  Upper Extremities: Full ROM and Muscle Strength 5/5  Lower Extremities : Right: Full ROM and Muscle Strength 5/5 Left Lower Extremity: Left BKA Arrived in wheelchair     Skin:    General: Skin is warm and dry.  Neurological:     Mental Status: He is alert and oriented to person, place, and time.  Psychiatric:        Mood and Affect: Mood normal.        Behavior: Behavior normal.         Assessment & Plan:  1.Left BKA/ [place] Pain : Continue current medication regimen: Continue current regimen until 01/02/2023, Refilled: oxycodone  15 mg one tablet 4 times a day as needed # 120 and  Butrans  10 mcg weekly #4   Second script sent for the following month. We will continue the opioid monitoring program, this consists of regular clinic visits, examinations, urine drug screen, pill counts as well as use of [place]  Controlled Substance Reporting system. A 12 month History has been reviewed on the [place]  Controlled Substance Reporting System on 01/02/2023.Continue: Topamax  50 mg one tablet at HS and Amitriptyline .. 2. Chronic Bilateral Low Back Pain without Sciatica: Continue HEP as Tolerated. Continue to Monitor. 01/02/2023. 3.Chronic  pain syndrome: Continue Current medication Regime. 01/02/2023. 4. Hx of spinal stimulator. Not Functioning: Mr. Dunton states he called the company, at this time he doesn't want  to have a new stimulator placed at this time. Continue to Monitor. 01/02/2023 5. COPD: On Continuous Oxygen Therapy @ 3 liters Nasal Cannula. Pulmonology Following. 01/02/2023 6. Depression: No complaints today. Continue: Cymbalta . Continue to monitor. 09/12//2024 7.. Sacral Decubitus Stage 3:  Continue adequate nutrition and weight shifting .Wound Care and ID Following. 01/02/2023. 8. Uncontrolled Hypertension: Blood pressure was re-checked. Mr. Batrez reports he is compliant with anti- hypertensive medication. He refuses ED or Urgent Care Evaluation he states he will check his blood pressure when he goes home and call office with reading. He was instructed to keep blood pressure log and F/u with his PCP. He verbalizes understanding.     F/U in 2 months

## 2023-05-01 ENCOUNTER — Encounter: Payer: Self-pay | Admitting: Registered Nurse

## 2023-05-01 ENCOUNTER — Encounter: Payer: Medicare Other | Attending: Registered Nurse | Admitting: Registered Nurse

## 2023-05-01 VITALS — BP 144/60 | HR 79 | Ht 70.0 in | Wt 244.0 lb

## 2023-05-01 DIAGNOSIS — L89153 Pressure ulcer of sacral region, stage 3: Secondary | ICD-10-CM | POA: Insufficient documentation

## 2023-05-01 DIAGNOSIS — G894 Chronic pain syndrome: Secondary | ICD-10-CM | POA: Diagnosis present

## 2023-05-01 DIAGNOSIS — M62838 Other muscle spasm: Secondary | ICD-10-CM | POA: Insufficient documentation

## 2023-05-01 DIAGNOSIS — Z79891 Long term (current) use of opiate analgesic: Secondary | ICD-10-CM | POA: Insufficient documentation

## 2023-05-01 DIAGNOSIS — Z5181 Encounter for therapeutic drug level monitoring: Secondary | ICD-10-CM | POA: Diagnosis not present

## 2023-05-01 MED ORDER — BUPRENORPHINE 10 MCG/HR TD PTWK: 1.0000 | MEDICATED_PATCH | TRANSDERMAL | 1 refills | Status: DC

## 2023-05-01 MED ORDER — OXYCODONE HCL 15 MG PO TABS: 15.0000 mg | ORAL_TABLET | Freq: Four times a day (QID) | ORAL | 0 refills | Status: DC | PRN

## 2023-05-01 MED ORDER — TIZANIDINE HCL 2 MG PO TABS: ORAL_TABLET | ORAL | 3 refills | Status: DC

## 2023-05-06 ENCOUNTER — Encounter: Payer: Self-pay | Admitting: Registered Nurse

## 2023-05-07 LAB — DRUG TOX MONITOR 1 W/CONF, ORAL FLD
Amphetamines: NEGATIVE ng/mL (ref <10)
Barbiturates: NEGATIVE ng/mL (ref <10)
Benzodiazepines: NEGATIVE ng/mL (ref <0.50)
Buprenorphine: 0.1 ng/mL — ABNORMAL HIGH (ref <0.10)
Buprenorphine: POSITIVE ng/mL — AB (ref <0.10)
Cocaine: NEGATIVE ng/mL (ref <5.0)
Codeine: NEGATIVE ng/mL (ref <2.5)
Dihydrocodeine: NEGATIVE ng/mL (ref <2.5)
Fentanyl: NEGATIVE ng/mL (ref <0.10)
Heroin Metabolite: NEGATIVE ng/mL (ref <1.0)
Hydrocodone: NEGATIVE ng/mL (ref <2.5)
Hydromorphone: NEGATIVE ng/mL (ref <2.5)
MARIJUANA: NEGATIVE ng/mL (ref <2.5)
MDMA: NEGATIVE ng/mL (ref <10)
Meprobamate: NEGATIVE ng/mL (ref <2.5)
Methadone: NEGATIVE ng/mL (ref <5.0)
Morphine: NEGATIVE ng/mL (ref <2.5)
Naloxone: NEGATIVE ng/mL (ref <0.25)
Nicotine Metabolite: NEGATIVE ng/mL (ref <5.0)
Norbuprenorphine: NEGATIVE ng/mL (ref <0.50)
Norhydrocodone: NEGATIVE ng/mL (ref <2.5)
Noroxycodone: 63.9 ng/mL — ABNORMAL HIGH (ref <2.5)
Opiates: POSITIVE ng/mL — AB (ref <2.5)
Oxycodone: >250 ng/mL — ABNORMAL HIGH (ref <2.5)
Oxymorphone: NEGATIVE ng/mL (ref <2.5)
Phencyclidine: NEGATIVE ng/mL (ref <10)
Tapentadol: NEGATIVE ng/mL (ref <5.0)
Tramadol: NEGATIVE ng/mL (ref <5.0)
Zolpidem: NEGATIVE ng/mL (ref <5.0)

## 2023-05-07 LAB — DRUG TOX ALC METAB W/CON, ORAL FLD: Alcohol Metabolite: NEGATIVE ng/mL (ref <25)

## 2023-06-09 ENCOUNTER — Other Ambulatory Visit: Payer: Self-pay | Admitting: Registered Nurse

## 2023-06-09 ENCOUNTER — Telehealth: Payer: Self-pay

## 2023-06-09 MED ORDER — OXYCODONE HCL 15 MG PO TABS: 15.0000 mg | ORAL_TABLET | Freq: Four times a day (QID) | ORAL | 0 refills | Status: DC | PRN

## 2023-06-09 NOTE — Telephone Encounter (Signed)
PMP was Reviewed.  Oxycodone e-scribed to pharmacy.

## 2023-06-09 NOTE — Telephone Encounter (Signed)
 Patient requesting refill on oxycodone  05/17/2023 05/01/2023 1  Oxycodone Hcl (Ir) 15 Mg Tab 120.00 30 Eu Tho [id number] Pie (8153) 0/0 90.00 MME [place]

## 2023-06-11 ENCOUNTER — Telehealth: Payer: Self-pay | Admitting: Registered Nurse

## 2023-06-11 NOTE — Telephone Encounter (Signed)
 Call placed to Jeremy [name], no answer. Left message to return the call.

## 2023-06-16 ENCOUNTER — Telehealth: Payer: Self-pay

## 2023-06-16 MED ORDER — OXYCODONE HCL 15 MG PO TABS: 15.0000 mg | ORAL_TABLET | Freq: Four times a day (QID) | ORAL | 0 refills | Status: DC | PRN

## 2023-06-16 NOTE — Telephone Encounter (Signed)
 PMP was Reviewed.  Oxycodone e-scribed to pharmacy. Call placed to Mrs. Seltzer regarding the above, she verbalizes understanding.

## 2023-06-16 NOTE — Telephone Encounter (Signed)
 [facility] does not have patients Oxycodone in stock but [facility] in [place] has the medication in stock. [facility]?

## 2023-06-25 NOTE — Progress Notes (Signed)
 Subjective:    Patient ID: Jeremy [name], male    DOB: [birth year], 73 y.o.   MRN: [id number]  HPI: Jeremy [name] is a 73 y.o. male who returns for follow up appointment for chronic pain and medication refill. He states his pain is located in his sacrum, Home Health Nurse from [facility] treating his sacral decubitus. Also reports he has phantom pain. He rates his pain 2. His current exercise regime is performing stretching exercises.  Jeremy [name] arrived to office hypertensive, blood pressure was recheckedhe states he didn't take  his anti-hypertensive medication. He refuses ED or Urgent Care evaluation  Jeremy [name] [name] equivalent is 90.00 MME.   Last Oral Swab was Performed on 05/01/2023, it was consistent.      Pain Inventory Average Pain 7 Pain Right Now 2 My pain is constant and stabbing  In the last 24 hours, has pain interfered with the following? General activity 7 Relation with others 5 Enjoyment of life 8 What TIME of day is your pain at its worst? varies Sleep (in general) Poor  Pain is worse with: walking, bending, sitting, standing, and some activites Pain improves with: medication Relief from Meds: 6  Family History  Problem Relation Age of Onset   Heart disease Brother    Social History   Socioeconomic History   Marital status: Married    Spouse name: Not on file   Number of children: Not on file   Years of education: Not on file   Highest education level: Not on file  Occupational History   Not on file  Tobacco Use   Smoking status: Former    Current packs/day: 0.00    Average packs/day: 0.5 packs/day for 6.0 years (3.0 ttl pk-yrs)    Types: Cigarettes    Start date: 12/02/1984    Quit date: 12/03/1990    Years since quitting: 32.5   Smokeless tobacco: Never  Vaping Use   Vaping status: Never Used  Substance and Sexual Activity   Alcohol use: Yes    Alcohol/week: 0.0 standard drinks of alcohol    Comment: rare   Drug use: Yes   Sexual activity:  Not Currently  Other Topics Concern   Not on file  Social History Narrative   Not on file   Social Drivers of Health   Financial Resource Strain: Not on file  Food Insecurity: Not on file  Transportation Needs: Not on file  Physical Activity: Not on file  Stress: Not on file  Social Connections: Not on file   Past Surgical History:  Procedure Laterality Date   AMPUTATION Left 11/25/2012   Procedure: AMPUTATION BELOW KNEE;  Surgeon: Nadara Mustard, MD;  Location: [facility] OR;  Service: Orthopedics;  Laterality: Left;  Left Below Knee Amputation   APLIGRAFT PLACEMENT Left 07/31/2012   Procedure: Apply Skin Graft;  Surgeon: Nadara Mustard, MD;  Location: [facility] OR;  Service: Orthopedics;  Laterality: Left;   APPLICATION OF WOUND VAC Left 07/31/2012   Procedure: APPLICATION OF WOUND VAC;  Surgeon: Nadara Mustard, MD;  Location: [facility] OR;  Service: Orthopedics;  Laterality: Left;   arm surgery     back stimulator     CARDIAC CATHETERIZATION     Hx: of " around 1985 at [facility]"   CYST EXCISION     Hx: of   I & D EXTREMITY  03/13/2011   Procedure: IRRIGATION AND DEBRIDEMENT EXTREMITY;  Surgeon: Nadara Mustard, MD;  Location: [facility] OR;  Service:  Orthopedics;  Laterality: Left;   I & D EXTREMITY Left 11/06/2012   Procedure: IRRIGATION AND DEBRIDEMENT EXTREMITY-Left lower ;  Surgeon: Nadara Mustard, MD;  Location: [facility] OR;  Service: Orthopedics;  Laterality: Left;  Left Leg Irrigation and Debridement, Place Skin Graft, Wound VAC, Theraskin   skin grafts     SKIN SPLIT GRAFT  03/13/2012   Procedure: SKIN GRAFT SPLIT THICKNESS;  Surgeon: Nadara Mustard, MD;  Location: [facility] OR;  Service: Orthopedics;  Laterality: Left;  Excisional Debridement Left Leg, Split Thickness Skin Graft, Wound VAC   stomach stapling     tummy tuck     VEIN LIGATION AND STRIPPING     Past Surgical History:  Procedure Laterality Date   AMPUTATION Left 11/25/2012   Procedure: AMPUTATION BELOW KNEE;  Surgeon: Nadara Mustard, MD;  Location: [facility] OR;   Service: Orthopedics;  Laterality: Left;  Left Below Knee Amputation   APLIGRAFT PLACEMENT Left 07/31/2012   Procedure: Apply Skin Graft;  Surgeon: Nadara Mustard, MD;  Location: [facility] OR;  Service: Orthopedics;  Laterality: Left;   APPLICATION OF WOUND VAC Left 07/31/2012   Procedure: APPLICATION OF WOUND VAC;  Surgeon: Nadara Mustard, MD;  [facility]: [facility];  Service: Orthopedics;  Laterality: Left;   arm surgery     back stimulator     CARDIAC CATHETERIZATION     Hx: of " around 1985 at [facility]"   CYST EXCISION     Hx: of   I & D EXTREMITY  03/13/2011   Procedure: IRRIGATION AND DEBRIDEMENT EXTREMITY;  Surgeon: Nadara Mustard, MD;  Location: [facility] OR;  Service: Orthopedics;  Laterality: Left;   I & D EXTREMITY Left 11/06/2012   Procedure: IRRIGATION AND DEBRIDEMENT EXTREMITY-Left lower ;  Surgeon: Nadara Mustard, MD;  Location: [facility] OR;  Service: Orthopedics;  Laterality: Left;  Left Leg Irrigation and Debridement, Place Skin Graft, Wound VAC, Theraskin   skin grafts     SKIN SPLIT GRAFT  03/13/2012   Procedure: SKIN GRAFT SPLIT THICKNESS;  Surgeon: Nadara Mustard, MD;  Location: [facility] OR;  Service: Orthopedics;  Laterality: Left;  Excisional Debridement Left Leg, Split Thickness Skin Graft, Wound VAC   stomach stapling     tummy tuck     VEIN LIGATION AND STRIPPING     Past Medical History:  Diagnosis Date   Anemia    Asthma    Cancer (HCC)    hx basal cell skin cancer   CHF (congestive heart failure) (HCC)    does not see a cardiologist   COPD (chronic obstructive pulmonary disease) (HCC)    3L continuous   Diabetes mellitus    not on medication   DVT (deep venous thrombosis) (HCC)    LLE DVT '80's   GERD (gastroesophageal reflux disease)    Headache(784.0)    sinus headaches   Leg pain    Peripheral neuropathy    Peripheral vascular disease (HCC)    Psoriasis    Shortness of breath    Sleep apnea    uses bipap, sleep study done in [place] 2 years ago, dr Cala Bradford byrd   There were no  vitals taken for this visit.  Opioid Risk Score:   Fall Risk Score:  `1  Depression screen PHQ 2/9     05/01/2023    1:01 PM 01/02/2023   12:52 PM 09/05/2022    1:02 PM 09/05/2022   12:59 PM 07/09/2022    2:25 PM 04/10/2022  1:00 PM 12/18/2021    1:52 PM  Depression screen PHQ 2/9  Decreased Interest 0 0 0 0 0 0 0  Down, Depressed, Hopeless 0 0 0 0 0 0 0  PHQ - 2 Score 0 0 0 0 0 0 0    Review of Systems  Musculoskeletal:        Lower back pain  All other systems reviewed and are negative.      Objective:   Physical Exam Vitals and nursing note reviewed.  Constitutional:      Appearance: Normal appearance.  Cardiovascular:     Rate and Rhythm: Normal rate and regular rhythm.     Pulses: Normal pulses.     Heart sounds: Normal heart sounds.  Pulmonary:     Effort: Pulmonary effort is normal.     Breath sounds: Normal breath sounds.  Musculoskeletal:     Comments: Normal Muscle Bulk and Muscle Testing Reveals:  Lumbar Paraspinal Tenderness: L-4-L-5 Lower Extremities: Right: Full ROM and Muscle Strength 5/5 Left Lower Extremity: Left BKA Arrived in wheelchair   Skin:    General: Skin is warm and dry.  Neurological:     Mental Status: He is alert and oriented to person, place, and time.  Psychiatric:        Mood and Affect: Mood normal.        Behavior: Behavior normal.         Assessment & Plan:  1.Left BKA/ [place] Pain : Continue current medication regimen: Continue current regimen until 06/26/2023, Refilled: oxycodone 15 mg one tablet 4 times a day as needed # 120 and  Butrans 10 mcg weekly #4   Second script sent for the following month. We will continue the opioid monitoring program, this consists of regular clinic visits, examinations, urine drug screen, pill counts as well as use of [place] Controlled Substance Reporting system. A 12 month History has been reviewed on the [place] Controlled Substance Reporting System on 06/26/2023.Continue:  Topamax 50 mg one tablet at HS and Amitriptyline.. 2. Chronic Bilateral Low Back Pain without Sciatica: Continue HEP as Tolerated. Continue to Monitor. 06/26/2023. 3.Chronic  pain syndrome: Continue Current medication Regime. 06/26/2023. 4. Hx of spinal stimulator. Not Functioning: Jeremy [name] states he called the company, at this time he doesn't want to have a new stimulator placed at this time. Continue to Monitor. 06/26/2023 5. COPD: On Continuous Oxygen Therapy @ 3 liters Nasal Cannula. Pulmonology Following. 06/26/2023 6. Depression: No complaints today. Continue: Cymbalta. Continue to monitor. 03/06//2025 7.. Sacral Decubitus Stage 3:  Continue adequate nutrition and weight shifting .Wound Care and ID Following. 06/26/2023. 8. Uncontrolled Hypertension: Blood pressure was re-checked. Jeremy [name] reports he didn't take his anti- hypertensive medication. He refuses ED or Urgent Care Evaluation he states he will check his blood pressure when he goes home and call office with reading. He was instructed to keep blood pressure log and F/u with his PCP. He verbalizes understanding.     F/U in 2 months

## 2023-06-26 ENCOUNTER — Encounter: Payer: Self-pay | Admitting: Registered Nurse

## 2023-06-26 ENCOUNTER — Encounter: Payer: Medicare Other | Attending: Registered Nurse | Admitting: Registered Nurse

## 2023-06-26 VITALS — BP 185/79 | HR 72 | Ht 70.0 in

## 2023-06-26 DIAGNOSIS — M545 Low back pain, unspecified: Secondary | ICD-10-CM | POA: Diagnosis not present

## 2023-06-26 DIAGNOSIS — G894 Chronic pain syndrome: Secondary | ICD-10-CM

## 2023-06-26 DIAGNOSIS — I10 Essential (primary) hypertension: Secondary | ICD-10-CM

## 2023-06-26 DIAGNOSIS — L89153 Pressure ulcer of sacral region, stage 3: Secondary | ICD-10-CM | POA: Diagnosis present

## 2023-06-26 DIAGNOSIS — G8929 Other chronic pain: Secondary | ICD-10-CM | POA: Diagnosis present

## 2023-06-26 DIAGNOSIS — Z79891 Long term (current) use of opiate analgesic: Secondary | ICD-10-CM

## 2023-06-26 DIAGNOSIS — M62838 Other muscle spasm: Secondary | ICD-10-CM

## 2023-06-26 DIAGNOSIS — G546 Phantom limb syndrome with pain: Secondary | ICD-10-CM | POA: Diagnosis present

## 2023-06-26 DIAGNOSIS — Z5181 Encounter for therapeutic drug level monitoring: Secondary | ICD-10-CM

## 2023-06-26 MED ORDER — AMITRIPTYLINE HCL 25 MG PO TABS: 25.0000 mg | ORAL_TABLET | Freq: Every day | ORAL | 2 refills | Status: DC

## 2023-06-26 MED ORDER — OXYCODONE HCL 15 MG PO TABS: 15.0000 mg | ORAL_TABLET | Freq: Four times a day (QID) | ORAL | 0 refills | Status: DC | PRN

## 2023-06-26 MED ORDER — BUPRENORPHINE 10 MCG/HR TD PTWK: 1.0000 | MEDICATED_PATCH | TRANSDERMAL | 1 refills | Status: DC

## 2023-08-25 NOTE — Progress Notes (Signed)
 Subjective:    Patient ID: Jeremy [name], male    DOB: 19-Jun-1950, 73 y.o.   MRN: [id number]  HPI: Jeremy [name] is a 73 y.o. male who returns for follow up appointment for chronic pain and medication refill. He states his pain is located in his sacrum  ( decubitus). He rates his pain 3. His current exercise regime is walking and performing stretching exercises.  Jeremy [name] equivalent is 90.00 MME.   Oral Swab was Performed today.      Pain Inventory Average Pain 7 Pain Right Now 3 My pain is sharp, stabbing, and aching  In the last 24 hours, has pain interfered with the following? General activity 0 Relation with others 0 Enjoyment of life 0 What TIME of day is your pain at its worst? morning  Sleep (in general) Poor  Pain is worse with: inactivity Pain improves with: medication Relief from Meds: 2  Family History  Problem Relation Age of Onset   Heart disease Brother    Social History   Socioeconomic History   Marital status: Married    Spouse name: Not on file   Number of children: Not on file   Years of education: Not on file   Highest education level: Not on file  Occupational History   Not on file  Tobacco Use   Smoking status: Former    Current packs/day: 0.00    Average packs/day: 0.5 packs/day for 6.0 years (3.0 ttl pk-yrs)    Types: Cigarettes    Start date: 12/02/1984    Quit date: 12/03/1990    Years since quitting: 32.7   Smokeless tobacco: Never  Vaping Use   Vaping status: Never Used  Substance and Sexual Activity   Alcohol  use: Yes    Alcohol /week: 0.0 standard drinks of alcohol     Comment: rare   Drug use: Yes   Sexual activity: Not Currently  Other Topics Concern   Not on file  Social History Narrative   Not on file   Social Drivers of Health   Financial Resource Strain: Not on file  Food Insecurity: Not on file  Transportation Needs: Not on file  Physical Activity: Not on file  Stress: Not on file  Social Connections: Not on  file   Past Surgical History:  Procedure Laterality Date   AMPUTATION Left 11/25/2012   Procedure: AMPUTATION BELOW KNEE;  Surgeon: Timothy Ford, MD;  Location: [facility] OR;  Service: Orthopedics;  Laterality: Left;  Left Below Knee Amputation   APLIGRAFT PLACEMENT Left 07/31/2012   Procedure: Apply Skin Graft;  Surgeon: Timothy Ford, MD;  Location: [facility] OR;  Service: Orthopedics;  Laterality: Left;   APPLICATION OF WOUND VAC Left 07/31/2012   Procedure: APPLICATION OF WOUND VAC;  Surgeon: Timothy Ford, MD;  Location: [facility];  Service: Orthopedics;  Laterality: Left;   arm surgery     back stimulator     CARDIAC CATHETERIZATION     Hx: of " around 1985 at [facility]"   CYST EXCISION     Hx: of   I & D EXTREMITY  03/13/2011   Procedure: IRRIGATION AND DEBRIDEMENT EXTREMITY;  Surgeon: Timothy Ford, MD;  Location: [facility];  Service: Orthopedics;  Laterality: Left;   I & D EXTREMITY Left 11/06/2012   Procedure: IRRIGATION AND DEBRIDEMENT EXTREMITY-Left lower ;  Surgeon: Timothy Ford, MD;  [facility]: [facility];  Service: Orthopedics;  Laterality: Left;  Left Leg Irrigation and Debridement, Place Skin Graft,  Wound VAC, Theraskin   skin grafts     SKIN SPLIT GRAFT  03/13/2012   Procedure: SKIN GRAFT SPLIT THICKNESS;  Surgeon: Timothy Ford, MD;  Location: [facility];  Service: Orthopedics;  Laterality: Left;  Excisional Debridement Left Leg, Split Thickness Skin Graft, Wound VAC   stomach stapling     tummy tuck     VEIN LIGATION AND STRIPPING     Past Surgical History:  Procedure Laterality Date   AMPUTATION Left 11/25/2012   Procedure: AMPUTATION BELOW KNEE;  Surgeon: Timothy Ford, MD;  [facility]: [facility];  Service: Orthopedics;  Laterality: Left;  Left Below Knee Amputation   APLIGRAFT PLACEMENT Left 07/31/2012   Procedure: Apply Skin Graft;  Surgeon: Timothy Ford, MD;  Location: [facility] OR;  Service: Orthopedics;  Laterality: Left;   APPLICATION OF WOUND VAC Left 07/31/2012   Procedure: APPLICATION OF WOUND VAC;  Surgeon:  Timothy Ford, MD;  [facility];  Service: Orthopedics;  Laterality: Left;   arm surgery     back stimulator     CARDIAC CATHETERIZATION     Hx: of " around 1985 at [facility]"   CYST EXCISION     Hx: of   I & D EXTREMITY  03/13/2011   Procedure: IRRIGATION AND DEBRIDEMENT EXTREMITY;  Surgeon: Timothy Ford, MD;  [facility];  Service: Orthopedics;  Laterality: Left;   I & D EXTREMITY Left 11/06/2012   Procedure: IRRIGATION AND DEBRIDEMENT EXTREMITY-Left lower ;  Surgeon: Timothy Ford, MD;  [facility];  Service: Orthopedics;  Laterality: Left;  Left Leg Irrigation and Debridement, Place Skin Graft, Wound VAC, Theraskin   skin grafts     SKIN SPLIT GRAFT  03/13/2012   Procedure: SKIN GRAFT SPLIT THICKNESS;  Surgeon: Timothy Ford, MD;  [facility]: [facility];  Service: Orthopedics;  Laterality: Left;  Excisional Debridement Left Leg, Split Thickness Skin Graft, Wound VAC   stomach stapling     tummy tuck     VEIN LIGATION AND STRIPPING     Past Medical History:  Diagnosis Date   Anemia    Asthma    Cancer (HCC)    hx basal cell skin cancer   CHF (congestive heart failure) (HCC)    does not see a cardiologist   COPD (chronic obstructive pulmonary disease) (HCC)    3L continuous   Diabetes mellitus    not on medication   DVT (deep venous thrombosis) (HCC)    LLE DVT '80's   GERD (gastroesophageal reflux disease)    Headache(784.0)    sinus headaches   Leg pain    Peripheral neuropathy    Peripheral vascular disease (HCC)    Psoriasis    Shortness of breath    Sleep apnea    uses bipap, sleep study done in [place] 2 years ago, dr Jullie Oiler byrd   BP (!) 158/65   Pulse 80   SpO2 99%   Opioid Risk Score:   Fall Risk Score:  `1  Depression screen PHQ 2/9     06/26/2023    1:13 PM 05/01/2023    1:01 PM 01/02/2023   12:52 PM 09/05/2022    1:02 PM 09/05/2022   12:59 PM 07/09/2022    2:25 PM 04/10/2022    1:00 PM  Depression screen PHQ 2/9  Decreased Interest 0 0 0 0 0 0 0   Down, Depressed, Hopeless 0 0 0 0 0 0 0  PHQ - 2 Score 0 0  0 0 0 0 0    Review of Systems  Musculoskeletal:        Stump pain  All other systems reviewed and are negative.     Objective:   Physical Exam Vitals and nursing note reviewed.  Constitutional:      Appearance: Normal appearance.  Cardiovascular:     Rate and Rhythm: Normal rate and regular rhythm.     Pulses: Normal pulses.     Heart sounds: Normal heart sounds.  Pulmonary:     Effort: Pulmonary effort is normal.     Breath sounds: Normal breath sounds.  Musculoskeletal:     Comments: Normal Muscle Bulk and Muscle Testing Reveals:  Upper Extremities: Full ROM and Muscle Strength 5/5 Lower Extremities: Right: Full ROM and Muscle Strength 5/5 Left Lower Extremity: Left BKA Arrived in wheelchair      Skin:    General: Skin is warm and dry.  Neurological:     Mental Status: He is alert and oriented to person, place, and time.  Psychiatric:        Mood and Affect: Mood normal.        Behavior: Behavior normal.         Assessment & Plan:  1.Left BKA/ [place] Pain : Continue current medication regimen: Continue current regimen until 08/26/2023, Refilled: oxycodone  15 mg one tablet 4 times a day as needed # 120 and  Butrans  10 mcg weekly #4   Second script sent for the following month. We will continue the opioid monitoring program, this consists of regular clinic visits, examinations, urine drug screen, pill counts as well as use of [place]  Controlled Substance Reporting system. A 12 month History has been reviewed on the [place]  Controlled Substance Reporting System on 06/26/2023.Continue: Topamax  50 mg one tablet at HS and Amitriptyline .. 2. Chronic Bilateral Low Back Pain without Sciatica: Continue HEP as Tolerated. Continue to Monitor. 08/26/2023. 3.Chronic  pain syndrome: Continue Current medication Regime. 08/26/2023. 4. Hx of spinal stimulator. Not Functioning: Mr. Lahaye states he called the  company, at this time he doesn't want to have a new stimulator placed at this time. Continue to Monitor. 08/26/2023 5. COPD: On Continuous Oxygen Therapy @ 3 liters Nasal Cannula. Pulmonology Following. 08/26/2023 6. Depression: No complaints today. Continue: Cymbalta . Continue to monitor. 05/06//2025 7.. Sacral Decubitus Stage 3:  Continue adequate nutrition and weight shifting .Wound Care and ID Following. 08/26/2023. 8. Essential Hypertension: Blood pressure was re-checked.  He refuses ED or Urgent Care Evaluation he states he will check his blood pressure when he goes home and call office with reading. He was instructed to keep blood pressure log and F/u with his PCP. He verbalizes understanding.     F/U in 2 months

## 2023-08-26 ENCOUNTER — Encounter: Payer: Self-pay | Admitting: Registered Nurse

## 2023-08-26 ENCOUNTER — Encounter: Attending: Registered Nurse | Admitting: Registered Nurse

## 2023-08-26 VITALS — BP 160/69 | HR 80

## 2023-08-26 DIAGNOSIS — Z79891 Long term (current) use of opiate analgesic: Secondary | ICD-10-CM

## 2023-08-26 DIAGNOSIS — G894 Chronic pain syndrome: Secondary | ICD-10-CM | POA: Diagnosis not present

## 2023-08-26 DIAGNOSIS — L89153 Pressure ulcer of sacral region, stage 3: Secondary | ICD-10-CM | POA: Diagnosis present

## 2023-08-26 DIAGNOSIS — Z5181 Encounter for therapeutic drug level monitoring: Secondary | ICD-10-CM | POA: Diagnosis not present

## 2023-08-26 MED ORDER — TIZANIDINE HCL 2 MG PO TABS: ORAL_TABLET | ORAL | 3 refills | Status: DC

## 2023-08-26 MED ORDER — BUPRENORPHINE 10 MCG/HR TD PTWK: 1.0000 | MEDICATED_PATCH | TRANSDERMAL | 1 refills | Status: DC

## 2023-08-26 MED ORDER — OXYCODONE HCL 15 MG PO TABS: 15.0000 mg | ORAL_TABLET | Freq: Four times a day (QID) | ORAL | 0 refills | Status: DC | PRN

## 2023-08-26 MED ORDER — OXYCODONE HCL 15 MG PO TABS
15.0000 mg | ORAL_TABLET | Freq: Four times a day (QID) | ORAL | 0 refills | Status: DC | PRN
Start: 2023-08-26 — End: 2023-10-29

## 2023-09-03 LAB — DRUG TOX MONITOR 1 W/CONF, ORAL FLD
Alprazolam: NEGATIVE ng/mL (ref <0.50)
Amphetamines: NEGATIVE ng/mL (ref <10)
Barbiturates: NEGATIVE ng/mL (ref <10)
Benzodiazepines: NEGATIVE ng/mL (ref <0.50)
Buprenorphine: 0.29 ng/mL — ABNORMAL HIGH (ref <0.10)
Buprenorphine: POSITIVE ng/mL — AB (ref <0.10)
Chlordiazepoxide: NEGATIVE ng/mL (ref <0.50)
Clonazepam: NEGATIVE ng/mL (ref <0.50)
Cocaine: NEGATIVE ng/mL (ref <5.0)
Codeine: NEGATIVE ng/mL (ref <2.5)
Diazepam: NEGATIVE ng/mL (ref <0.50)
Dihydrocodeine: NEGATIVE ng/mL (ref <2.5)
Fentanyl: NEGATIVE ng/mL (ref <0.10)
Flunitrazepam: NEGATIVE ng/mL (ref <0.50)
Flurazepam: NEGATIVE ng/mL (ref <0.50)
Heroin Metabolite: NEGATIVE ng/mL (ref <1.0)
Hydrocodone: NEGATIVE ng/mL (ref <2.5)
Hydromorphone: NEGATIVE ng/mL (ref <2.5)
Lorazepam: NEGATIVE ng/mL (ref <0.50)
MARIJUANA: NEGATIVE ng/mL (ref <2.5)
MDMA: NEGATIVE ng/mL (ref <10)
Meprobamate: NEGATIVE ng/mL (ref <2.5)
Methadone: NEGATIVE ng/mL (ref <5.0)
Midazolam: NEGATIVE ng/mL (ref <0.50)
Morphine: NEGATIVE ng/mL (ref <2.5)
Naloxone: NEGATIVE ng/mL (ref <0.25)
Nicotine Metabolite: NEGATIVE ng/mL (ref <5.0)
Norbuprenorphine: NEGATIVE ng/mL (ref <0.50)
Nordiazepam: NEGATIVE ng/mL (ref <0.50)
Norhydrocodone: NEGATIVE ng/mL (ref <2.5)
Noroxycodone: 46.1 ng/mL — ABNORMAL HIGH (ref <2.5)
Opiates: POSITIVE ng/mL — AB (ref <2.5)
Oxazepam: NEGATIVE ng/mL (ref <0.50)
Oxycodone: >250 ng/mL — ABNORMAL HIGH (ref <2.5)
Oxymorphone: NEGATIVE ng/mL (ref <2.5)
Phencyclidine: NEGATIVE ng/mL (ref <10)
Tapentadol: NEGATIVE ng/mL (ref <5.0)
Temazepam: NEGATIVE ng/mL (ref <0.50)
Tramadol: NEGATIVE ng/mL (ref <5.0)
Triazolam: NEGATIVE ng/mL (ref <0.50)
Zolpidem: NEGATIVE ng/mL (ref <5.0)

## 2023-09-03 LAB — DRUG TOX ALC METAB W/CON, ORAL FLD: Alcohol Metabolite: NEGATIVE ng/mL (ref <25)

## 2023-10-29 ENCOUNTER — Encounter: Attending: Registered Nurse | Admitting: Registered Nurse

## 2023-10-29 ENCOUNTER — Encounter: Payer: Self-pay | Admitting: Registered Nurse

## 2023-10-29 VITALS — BP 143/61 | HR 64 | Ht 70.0 in | Wt 263.2 lb

## 2023-10-29 DIAGNOSIS — L89153 Pressure ulcer of sacral region, stage 3: Secondary | ICD-10-CM | POA: Diagnosis present

## 2023-10-29 DIAGNOSIS — M545 Low back pain, unspecified: Secondary | ICD-10-CM | POA: Diagnosis not present

## 2023-10-29 DIAGNOSIS — Z79891 Long term (current) use of opiate analgesic: Secondary | ICD-10-CM | POA: Insufficient documentation

## 2023-10-29 DIAGNOSIS — G8929 Other chronic pain: Secondary | ICD-10-CM | POA: Diagnosis present

## 2023-10-29 DIAGNOSIS — G894 Chronic pain syndrome: Secondary | ICD-10-CM | POA: Insufficient documentation

## 2023-10-29 DIAGNOSIS — Z5181 Encounter for therapeutic drug level monitoring: Secondary | ICD-10-CM | POA: Insufficient documentation

## 2023-10-29 MED ORDER — BUPRENORPHINE 10 MCG/HR TD PTWK
1.0000 | MEDICATED_PATCH | TRANSDERMAL | 1 refills | Status: DC
Start: 2023-10-29 — End: 2023-12-30

## 2023-10-29 MED ORDER — OXYCODONE HCL 15 MG PO TABS: 15.0000 mg | ORAL_TABLET | Freq: Four times a day (QID) | ORAL | 0 refills | Status: DC | PRN

## 2023-10-29 NOTE — Progress Notes (Signed)
 Subjective:    Patient ID: Jeremy [name], male    DOB: 11-06-1950, 73 y.o.   MRN: [id number]  HPI: Jeremy [name] is a 73 y.o. male who returns for follow up appointment for chronic pain and medication refill. He states his pain is located in his lower back and phantom pain. He rates his pain 2. His current exercise regime is performing stretching exercises.  [name] Morphine equivalent is 90.00 MME.   Oral Swab was Performed on 08/26/2023, it was consistent.      Pain Inventory Average Pain 5 Pain Right Now 2 My pain is intermittent, sharp, stabbing, and aching  In the last 24 hours, has pain interfered with the following? General activity 2 Relation with others 3 Enjoyment of life 2 What TIME of day is your pain at its worst? morning  Sleep (in general) Poor  Pain is worse with: inactivity Pain improves with: medication Relief from Meds: 5  Family History  Problem Relation Age of Onset   Heart disease Brother    Social History   Socioeconomic History   Marital status: Married    Spouse name: Not on file   Number of children: Not on file   Years of education: Not on file   Highest education level: Not on file  Occupational History   Not on file  Tobacco Use   Smoking status: Former    Current packs/day: 0.00    Average packs/day: 0.5 packs/day for 6.0 years (3.0 ttl pk-yrs)    Types: Cigarettes    Start date: 12/02/1984    Quit date: 12/03/1990    Years since quitting: 32.9   Smokeless tobacco: Never  Vaping Use   Vaping status: Never Used  Substance and Sexual Activity   Alcohol  use: Yes    Alcohol /week: 0.0 standard drinks of alcohol     Comment: rare   Drug use: Yes   Sexual activity: Not Currently  Other Topics Concern   Not on file  Social History Narrative   Not on file   Social Drivers of Health   Financial Resource Strain: Not on file  Food Insecurity: Not on file  Transportation Needs: Not on file  Physical Activity: Not on file  Stress: Not on  file  Social Connections: Not on file   Past Surgical History:  Procedure Laterality Date   AMPUTATION Left 11/25/2012   Procedure: AMPUTATION BELOW KNEE;  Surgeon: Jerona LULLA Sage, MD;  Location: [facility];  Service: Orthopedics;  Laterality: Left;  Left Below Knee Amputation   APLIGRAFT PLACEMENT Left 07/31/2012   Procedure: Apply Skin Graft;  Surgeon: Jerona LULLA Sage, MD;  Location: [facility] OR;  Service: Orthopedics;  Laterality: Left;   APPLICATION OF WOUND VAC Left 07/31/2012   Procedure: APPLICATION OF WOUND VAC;  Surgeon: Jerona LULLA Sage, MD;  Location: [facility];  Service: Orthopedics;  Laterality: Left;   arm surgery     back stimulator     CARDIAC CATHETERIZATION     Hx: of  around 1985 at [facility]   CYST EXCISION     Hx: of   I & D EXTREMITY  03/13/2011   Procedure: IRRIGATION AND DEBRIDEMENT EXTREMITY;  Surgeon: Jerona LULLA Sage, MD;  Location: [facility];  Service: Orthopedics;  Laterality: Left;   I & D EXTREMITY Left 11/06/2012   Procedure: IRRIGATION AND DEBRIDEMENT EXTREMITY-Left lower ;  Surgeon: Jerona LULLA Sage, MD;  Location: [facility];  Service: Orthopedics;  Laterality: Left;  Left Leg Irrigation and  Debridement, Place Skin Graft, Wound VAC, Theraskin   skin grafts     SKIN SPLIT GRAFT  03/13/2012   Procedure: SKIN GRAFT SPLIT THICKNESS;  Surgeon: Jerona LULLA Sage, MD;  Location: [facility];  Service: Orthopedics;  Laterality: Left;  Excisional Debridement Left Leg, Split Thickness Skin Graft, Wound VAC   stomach stapling     tummy tuck     VEIN LIGATION AND STRIPPING     Past Surgical History:  Procedure Laterality Date   AMPUTATION Left 11/25/2012   Procedure: AMPUTATION BELOW KNEE;  Surgeon: Jerona LULLA Sage, MD;  [facility]: [facility];  Service: Orthopedics;  Laterality: Left;  Left Below Knee Amputation   APLIGRAFT PLACEMENT Left 07/31/2012   Procedure: Apply Skin Graft;  Surgeon: Jerona LULLA Sage, MD;  Location: [facility] OR;  Service: Orthopedics;  Laterality: Left;   APPLICATION OF WOUND VAC Left 07/31/2012   Procedure:  APPLICATION OF WOUND VAC;  Surgeon: Jerona LULLA Sage, MD;  Location: [facility];  Service: Orthopedics;  Laterality: Left;   arm surgery     back stimulator     CARDIAC CATHETERIZATION     Hx: of  around 1985 at [facility]   CYST EXCISION     Hx: of   I & D EXTREMITY  03/13/2011   Procedure: IRRIGATION AND DEBRIDEMENT EXTREMITY;  Surgeon: Jerona LULLA Sage, MD;  [facility]: [facility];  Service: Orthopedics;  Laterality: Left;   I & D EXTREMITY Left 11/06/2012   Procedure: IRRIGATION AND DEBRIDEMENT EXTREMITY-Left lower ;  Surgeon: Jerona LULLA Sage, MD;  Location: [facility];  Service: Orthopedics;  Laterality: Left;  Left Leg Irrigation and Debridement, Place Skin Graft, Wound VAC, Theraskin   skin grafts     SKIN SPLIT GRAFT  03/13/2012   Procedure: SKIN GRAFT SPLIT THICKNESS;  Surgeon: Jerona LULLA Sage, MD;  Location: [facility];  Service: Orthopedics;  Laterality: Left;  Excisional Debridement Left Leg, Split Thickness Skin Graft, Wound VAC   stomach stapling     tummy tuck     VEIN LIGATION AND STRIPPING     Past Medical History:  Diagnosis Date   Anemia    Asthma    Cancer (HCC)    hx basal cell skin cancer   CHF (congestive heart failure) (HCC)    does not see a cardiologist   COPD (chronic obstructive pulmonary disease) (HCC)    3L continuous   Diabetes mellitus    not on medication   DVT (deep venous thrombosis) (HCC)    LLE DVT '80's   GERD (gastroesophageal reflux disease)    Headache(784.0)    sinus headaches   Leg pain    Peripheral neuropathy    Peripheral vascular disease (HCC)    Psoriasis    Shortness of breath    Sleep apnea    uses bipap, sleep study done in [place] 2 years ago, dr suzen byrd   Ht 5' 10 (1.778 m)   Wt 263 lb 3.2 oz (119.4 kg)   BMI 37.77 kg/m   Opioid Risk Score:   Fall Risk Score:  `1  Depression screen PHQ 2/9     10/29/2023   12:58 PM 06/26/2023    1:13 PM 05/01/2023    1:01 PM 01/02/2023   12:52 PM 09/05/2022    1:02 PM 09/05/2022   12:59 PM 07/09/2022    2:25  PM  Depression screen PHQ 2/9  Decreased Interest 0 0 0 0 0 0 0  Down, Depressed, Hopeless 0 0 0  0 0 0 0  PHQ - 2 Score 0 0 0 0 0 0 0    Review of Systems  Musculoskeletal:  Positive for back pain and gait problem.       Left lower leg pain  All other systems reviewed and are negative.      Objective:   Physical Exam Vitals and nursing note reviewed.  Constitutional:      Appearance: Normal appearance.  Cardiovascular:     Rate and Rhythm: Normal rate and regular rhythm.     Pulses: Normal pulses.     Heart sounds: Normal heart sounds.  Pulmonary:     Effort: Pulmonary effort is normal.     Breath sounds: Normal breath sounds.     Comments: 3 liters nasal cannula  Musculoskeletal:     Comments: Normal Muscle Bulk and Muscle Testing Reveals:  Upper Extremities: Full ROM and Muscle Strength 5/5 Lower Extremities: Right: Full ROM and Muscle Strength 5/5 Left Lower Extremity: BKA Arrived in wheelchair      Skin:    General: Skin is warm and dry.  Neurological:     Mental Status: He is alert and oriented to person, place, and time.  Psychiatric:        Mood and Affect: Mood normal.        Behavior: Behavior normal.          Assessment & Plan:  1.Left BKA/ [place] Pain : Continue current medication regimen: Continue current regimen until 10/29/2023, Refilled: oxycodone  15 mg one tablet 4 times a day as needed # 120 and  Butrans  10 mcg weekly #4   Second script sent for the following month. We will continue the opioid monitoring program, this consists of regular clinic visits, examinations, urine drug screen, pill counts as well as use of [place]  Controlled Substance Reporting system. A 12 month History has been reviewed on the [place]  Controlled Substance Reporting System on 10/29/2023.Continue: Topamax  50 mg one tablet at HS and Amitriptyline .. 2. Chronic Bilateral Low Back Pain without Sciatica: Continue HEP as Tolerated. Continue to Monitor.  10/29/2023. 3.Chronic  pain syndrome: Continue Current medication Regime. 10/29/2023. 4. Hx of spinal stimulator. Not Functioning: Mr. Waybright states he called the company, at this time he doesn't want to have a new stimulator placed at this time. Continue to Monitor. 10/29/2023 5. COPD: On Continuous Oxygen Therapy @ 3 liters Nasal Cannula. Pulmonology Following. 10/29/2023 6. Depression: No complaints today. Continue: Cymbalta . Continue to monitor. 07/09//2025 7.. Sacral Decubitus Stage 3:  Continue adequate nutrition and weight shifting a. Wound Care Nurse and ID Following. 10/29/2023.   F/U in 2 months

## 2023-12-30 ENCOUNTER — Encounter: Payer: Self-pay | Admitting: Registered Nurse

## 2023-12-30 ENCOUNTER — Encounter: Attending: Registered Nurse | Admitting: Registered Nurse

## 2023-12-30 VITALS — BP 166/70 | HR 80 | Ht 70.0 in

## 2023-12-30 DIAGNOSIS — Z79891 Long term (current) use of opiate analgesic: Secondary | ICD-10-CM | POA: Insufficient documentation

## 2023-12-30 DIAGNOSIS — Z5181 Encounter for therapeutic drug level monitoring: Secondary | ICD-10-CM | POA: Insufficient documentation

## 2023-12-30 DIAGNOSIS — M545 Low back pain, unspecified: Secondary | ICD-10-CM | POA: Insufficient documentation

## 2023-12-30 DIAGNOSIS — G8929 Other chronic pain: Secondary | ICD-10-CM | POA: Diagnosis present

## 2023-12-30 DIAGNOSIS — G894 Chronic pain syndrome: Secondary | ICD-10-CM | POA: Diagnosis present

## 2023-12-30 DIAGNOSIS — L89153 Pressure ulcer of sacral region, stage 3: Secondary | ICD-10-CM | POA: Diagnosis present

## 2023-12-30 MED ORDER — BUPRENORPHINE 10 MCG/HR TD PTWK: 1.0000 | MEDICATED_PATCH | TRANSDERMAL | 1 refills | Status: DC

## 2023-12-30 MED ORDER — OXYCODONE HCL 15 MG PO TABS: 15.0000 mg | ORAL_TABLET | Freq: Four times a day (QID) | ORAL | 0 refills | Status: DC | PRN

## 2023-12-30 NOTE — Progress Notes (Signed)
 Subjective:    Patient ID: Jeremy [name], male    DOB: 1950-06-21, 73 y.o.   MRN: [id number]  HPI: Jeremy [name] is a 73 y.o. male who returns for follow up appointment for chronic pain and medication refill.He  states his pain is located in his lower back and sacral wound pain he reports.SABRA He rates his pain 6. His current exercise regime is walking and performing stretching exercises.  Jeremy [name] Morphine equivalent is 90.00 MME.   Oral Swab was Performed today.     Pain Inventory Average Pain 4 Pain Right Now 6 My pain is sharp, stabbing, and aching  In the last 24 hours, has pain interfered with the following? General activity 7 Relation with others 7 Enjoyment of life 7 What TIME of day is your pain at its worst? morning  Sleep (in general) Poor  Pain is worse with: inactivity Pain improves with: medication Relief from Meds: 2  Family History  Problem Relation Age of Onset   Heart disease Brother    Social History   Socioeconomic History   Marital status: Married    Spouse name: Not on file   Number of children: Not on file   Years of education: Not on file   Highest education level: Not on file  Occupational History   Not on file  Tobacco Use   Smoking status: Former    Current packs/day: 0.00    Average packs/day: 0.5 packs/day for 6.0 years (3.0 ttl pk-yrs)    Types: Cigarettes    Start date: 12/02/1984    Quit date: 12/03/1990    Years since quitting: 33.0   Smokeless tobacco: Never  Vaping Use   Vaping status: Never Used  Substance and Sexual Activity   Alcohol  use: Yes    Alcohol /week: 0.0 standard drinks of alcohol     Comment: rare   Drug use: Yes   Sexual activity: Not Currently  Other Topics Concern   Not on file  Social History Narrative   Not on file   Social Drivers of Health   Financial Resource Strain: Not on file  Food Insecurity: Not on file  Transportation Needs: Not on file  Physical Activity: Not on file  Stress: Not on file  Social  Connections: Not on file   Past Surgical History:  Procedure Laterality Date   AMPUTATION Left 11/25/2012   Procedure: AMPUTATION BELOW KNEE;  Surgeon: Jerona LULLA Sage, MD;  Location: [facility] OR;  Service: Orthopedics;  Laterality: Left;  Left Below Knee Amputation   APLIGRAFT PLACEMENT Left 07/31/2012   Procedure: Apply Skin Graft;  Surgeon: Jerona LULLA Sage, MD;  Location: [facility] OR;  Service: Orthopedics;  Laterality: Left;   APPLICATION OF WOUND VAC Left 07/31/2012   Procedure: APPLICATION OF WOUND VAC;  Surgeon: Jerona LULLA Sage, MD;  Location: [facility] OR;  Service: Orthopedics;  Laterality: Left;   arm surgery     back stimulator     CARDIAC CATHETERIZATION     Hx: of  around 1985 at [facility]   CYST EXCISION     Hx: of   I & D EXTREMITY  03/13/2011   Procedure: IRRIGATION AND DEBRIDEMENT EXTREMITY;  Surgeon: Jerona LULLA Sage, MD;  Location: [facility];  Service: Orthopedics;  Laterality: Left;   I & D EXTREMITY Left 11/06/2012   Procedure: IRRIGATION AND DEBRIDEMENT EXTREMITY-Left lower ;  Surgeon: Jerona LULLA Sage, MD;  Location: [facility];  Service: Orthopedics;  Laterality: Left;  Left Leg Irrigation and Debridement,  Place Skin Graft, Wound VAC, Theraskin   skin grafts     SKIN SPLIT GRAFT  03/13/2012   Procedure: SKIN GRAFT SPLIT THICKNESS;  Surgeon: Jerona LULLA Sage, MD;  Location: [facility];  Service: Orthopedics;  Laterality: Left;  Excisional Debridement Left Leg, Split Thickness Skin Graft, Wound VAC   stomach stapling     tummy tuck     VEIN LIGATION AND STRIPPING     Past Surgical History:  Procedure Laterality Date   AMPUTATION Left 11/25/2012   Procedure: AMPUTATION BELOW KNEE;  Surgeon: Jerona LULLA Sage, MD;  Location: [facility];  Service: Orthopedics;  Laterality: Left;  Left Below Knee Amputation   APLIGRAFT PLACEMENT Left 07/31/2012   Procedure: Apply Skin Graft;  Surgeon: Jerona LULLA Sage, MD;  Location: [facility] OR;  Service: Orthopedics;  Laterality: Left;   APPLICATION OF WOUND VAC Left 07/31/2012   Procedure: APPLICATION OF  WOUND VAC;  Surgeon: Jerona LULLA Sage, MD;  Location: [facility];  Service: Orthopedics;  Laterality: Left;   arm surgery     back stimulator     CARDIAC CATHETERIZATION     Hx: of  around 1985 at [facility]   CYST EXCISION     Hx: of   I & D EXTREMITY  03/13/2011   Procedure: IRRIGATION AND DEBRIDEMENT EXTREMITY;  Surgeon: Jerona LULLA Sage, MD;  [facility]: [facility];  Service: Orthopedics;  Laterality: Left;   I & D EXTREMITY Left 11/06/2012   Procedure: IRRIGATION AND DEBRIDEMENT EXTREMITY-Left lower ;  Surgeon: Jerona LULLA Sage, MD;  [facility]: [facility];  Service: Orthopedics;  Laterality: Left;  Left Leg Irrigation and Debridement, Place Skin Graft, Wound VAC, Theraskin   skin grafts     SKIN SPLIT GRAFT  03/13/2012   Procedure: SKIN GRAFT SPLIT THICKNESS;  Surgeon: Jerona LULLA Sage, MD;  Location: [facility];  Service: Orthopedics;  Laterality: Left;  Excisional Debridement Left Leg, Split Thickness Skin Graft, Wound VAC   stomach stapling     tummy tuck     VEIN LIGATION AND STRIPPING     Past Medical History:  Diagnosis Date   Anemia    Asthma    Cancer (HCC)    hx basal cell skin cancer   CHF (congestive heart failure) (HCC)    does not see a cardiologist   COPD (chronic obstructive pulmonary disease) (HCC)    3L continuous   Diabetes mellitus    not on medication   DVT (deep venous thrombosis) (HCC)    LLE DVT '80's   GERD (gastroesophageal reflux disease)    Headache(784.0)    sinus headaches   Leg pain    Peripheral neuropathy    Peripheral vascular disease (HCC)    Psoriasis    Shortness of breath    Sleep apnea    uses bipap, sleep study done in [place] 2 years ago, dr suzen byrd   BP (!) 165/66   Pulse 80   Ht 5' 10 (1.778 m)   SpO2 96%   BMI 37.77 kg/m   Opioid Risk Score:   Fall Risk Score:  `1  Depression screen PHQ 2/9     12/30/2023    1:06 PM 10/29/2023   12:58 PM 06/26/2023    1:13 PM 05/01/2023    1:01 PM 01/02/2023   12:52 PM 09/05/2022    1:02 PM 09/05/2022   12:59 PM   Depression screen PHQ 2/9  Decreased Interest 0 0 0 0 0 0 0  Down, Depressed, Hopeless  0 0 0 0 0 0 0  PHQ - 2 Score 0 0 0 0 0 0 0     Review of Systems  Musculoskeletal:  Positive for back pain.       Left knee  All other systems reviewed and are negative.      Objective:   Physical Exam Vitals and nursing note reviewed.  Constitutional:      Appearance: Normal appearance.  Cardiovascular:     Rate and Rhythm: Normal rate and regular rhythm.     Pulses: Normal pulses.     Heart sounds: Normal heart sounds.  Pulmonary:     Effort: Pulmonary effort is normal.     Breath sounds: Normal breath sounds.     Comments: Continuous Oxygen 3 liters nasal cannula  Musculoskeletal:     Comments: Normal Muscle Bulk and Muscle Testing Reveals:  Upper Extremities: Full ROM and Muscle Strength 5/5  Lumbar Paraspinal Tenderness: L-4-L-5 Lower Extremities: Rihjt: Full ROM and Muscle Strength 5/5 Left: BKA Arrived in wheelchair      Skin:    General: Skin is warm and dry.  Neurological:     Mental Status: He is alert and oriented to person, place, and time.  Psychiatric:        Mood and Affect: Mood normal.        Behavior: Behavior normal.          Assessment & Plan:  1.Left BKA/ [place] Pain : Continue current medication regimen: Continue current regimen until 12/30/2023, Refilled: oxycodone  15 mg one tablet 4 times a day as needed # 120 and  Butrans  10 mcg weekly #4   Second script sent for the following month. We will continue the opioid monitoring program, this consists of regular clinic visits, examinations, urine drug screen, pill counts as well as use of [place]  Controlled Substance Reporting system. A 12 month History has been reviewed on the [place]  Controlled Substance Reporting System on 10/29/2023.Continue: Topamax  50 mg one tablet at HS and Amitriptyline .. 2. Chronic Bilateral Low Back Pain without Sciatica: Continue HEP as Tolerated. Continue to Monitor.  12/30/2023. 3.Chronic  pain syndrome: Continue Current medication Regime. 12/30/2023. 4. Hx of spinal stimulator. Not Functioning: Jeremy [name] states he called the company, at this time he doesn't want to have a new stimulator placed at this time. Continue to Monitor. 12/30/2023 5. COPD: On Continuous Oxygen Therapy @ 3 liters Nasal Cannula. Pulmonology Following. 12/30/2023 6. Depression: No complaints today. Continue: Cymbalta . Continue to monitor. 09/09//2025 7.. Sacral Decubitus Stage 3:  Continue adequate nutrition and weight shifting a. Wound Care Nurse and ID Following. 12/30/2023.    F/U in 2 months

## 2023-12-31 ENCOUNTER — Telehealth: Payer: Self-pay

## 2023-12-31 MED ORDER — OXYCODONE HCL 15 MG PO TABS: 15.0000 mg | ORAL_TABLET | Freq: Four times a day (QID) | ORAL | 0 refills | Status: DC | PRN

## 2023-12-31 NOTE — Telephone Encounter (Signed)
 Patient left a message on voicemail regarding September refill.  Patient has rx at pharmacy for February 01, 2024 but, per Pharmacist they do not have one for September Oxycodone .  Pharmacy did receive the rx September refill for Butrans . Discussed with Fidela and matter will be reviewed and if information is correct and Pharmacy did not receive the rx for September fill, one will be sent.

## 2024-01-08 LAB — DRUG TOX MONITOR 1 W/CONF, ORAL FLD
Amphetamines: NEGATIVE ng/mL (ref <10)
Barbiturates: NEGATIVE ng/mL (ref <10)
Benzodiazepines: NEGATIVE ng/mL (ref <0.50)
Buprenorphine: 2.82 ng/mL — ABNORMAL HIGH (ref <0.10)
Buprenorphine: POSITIVE ng/mL — AB (ref <0.10)
Cocaine: NEGATIVE ng/mL (ref <5.0)
Codeine: NEGATIVE ng/mL (ref <2.5)
Dihydrocodeine: NEGATIVE ng/mL (ref <2.5)
Fentanyl: NEGATIVE ng/mL (ref <0.10)
Heroin Metabolite: NEGATIVE ng/mL (ref <1.0)
Hydrocodone: NEGATIVE ng/mL (ref <2.5)
Hydromorphone: NEGATIVE ng/mL (ref <2.5)
MARIJUANA: NEGATIVE ng/mL (ref <2.5)
MDMA: NEGATIVE ng/mL (ref <10)
Meprobamate: NEGATIVE ng/mL (ref <2.5)
Methadone: NEGATIVE ng/mL (ref <5.0)
Morphine: NEGATIVE ng/mL (ref <2.5)
Naloxone: NEGATIVE ng/mL (ref <0.25)
Nicotine Metabolite: NEGATIVE ng/mL (ref <5.0)
Norbuprenorphine: NEGATIVE ng/mL (ref <0.50)
Norhydrocodone: NEGATIVE ng/mL (ref <2.5)
Noroxycodone: 19.9 ng/mL — ABNORMAL HIGH (ref <2.5)
Opiates: POSITIVE ng/mL — AB (ref <2.5)
Oxycodone: 77 ng/mL — ABNORMAL HIGH (ref <2.5)
Oxymorphone: NEGATIVE ng/mL (ref <2.5)
Phencyclidine: NEGATIVE ng/mL (ref <10)
Tapentadol: NEGATIVE ng/mL (ref <5.0)
Tramadol: NEGATIVE ng/mL (ref <5.0)
Zolpidem: NEGATIVE ng/mL (ref <5.0)

## 2024-01-08 LAB — DRUG TOX ALC METAB W/CON, ORAL FLD: Alcohol Metabolite: NEGATIVE ng/mL (ref <25)

## 2024-02-25 ENCOUNTER — Encounter: Attending: Registered Nurse | Admitting: Registered Nurse

## 2024-02-25 ENCOUNTER — Encounter: Payer: Self-pay | Admitting: Registered Nurse

## 2024-02-25 VITALS — BP 120/89 | HR 86 | Ht 70.0 in | Wt 263.3 lb

## 2024-02-25 DIAGNOSIS — Z79891 Long term (current) use of opiate analgesic: Secondary | ICD-10-CM | POA: Diagnosis not present

## 2024-02-25 DIAGNOSIS — Z5181 Encounter for therapeutic drug level monitoring: Secondary | ICD-10-CM | POA: Diagnosis not present

## 2024-02-25 DIAGNOSIS — L89153 Pressure ulcer of sacral region, stage 3: Secondary | ICD-10-CM | POA: Diagnosis present

## 2024-02-25 DIAGNOSIS — G546 Phantom limb syndrome with pain: Secondary | ICD-10-CM | POA: Insufficient documentation

## 2024-02-25 DIAGNOSIS — G8929 Other chronic pain: Secondary | ICD-10-CM | POA: Diagnosis present

## 2024-02-25 DIAGNOSIS — M545 Low back pain, unspecified: Secondary | ICD-10-CM | POA: Insufficient documentation

## 2024-02-25 DIAGNOSIS — G894 Chronic pain syndrome: Secondary | ICD-10-CM | POA: Diagnosis not present

## 2024-02-25 MED ORDER — AMITRIPTYLINE HCL 25 MG PO TABS: 25.0000 mg | ORAL_TABLET | Freq: Every day | ORAL | 2 refills | Status: AC

## 2024-02-25 MED ORDER — OXYCODONE HCL 15 MG PO TABS: 15.0000 mg | ORAL_TABLET | Freq: Four times a day (QID) | ORAL | 0 refills | Status: DC | PRN

## 2024-02-25 MED ORDER — TIZANIDINE HCL 2 MG PO TABS: ORAL_TABLET | ORAL | 3 refills | Status: AC

## 2024-02-25 MED ORDER — BUPRENORPHINE 10 MCG/HR TD PTWK: 1.0000 | MEDICATED_PATCH | TRANSDERMAL | 1 refills | Status: DC

## 2024-02-25 NOTE — Progress Notes (Signed)
 Subjective:    Patient ID: Jeremy [name], male    DOB: Sep 23, 1950, 74 y.o.   MRN: [id number]  HPI: Jeremy [name] is a 73 y.o. male who returns for follow up appointment for chronic pain and medication refill. He states his pain is located in his lower back and left stump phantom pain. He rates his pain 5. His current exercise regime is performing stretching exercises.  Jeremy [name] Morphine equivalent is 90.00 MME.   Last Oral Swab was Performed on 12/30/2023, it was consistent.     Pain Inventory Average Pain 8 Pain Right Now 5 My pain is sharp, stabbing, and aching  In the last 24 hours, has pain interfered with the following? General activity 7 Relation with others 7 Enjoyment of life 7 What TIME of day is your pain at its worst? morning  Sleep (in general) Poor  Pain is worse with: inactivity Pain improves with: medication Relief from Meds: 2  Family History  Problem Relation Age of Onset   Heart disease Brother    Social History   Socioeconomic History   Marital status: Married    Spouse name: Not on file   Number of children: Not on file   Years of education: Not on file   Highest education level: Not on file  Occupational History   Not on file  Tobacco Use   Smoking status: Former    Current packs/day: 0.00    Average packs/day: 0.5 packs/day for 6.0 years (3.0 ttl pk-yrs)    Types: Cigarettes    Start date: 12/02/1984    Quit date: 12/03/1990    Years since quitting: 33.2   Smokeless tobacco: Never  Vaping Use   Vaping status: Never Used  Substance and Sexual Activity   Alcohol  use: Yes    Alcohol /week: 0.0 standard drinks of alcohol     Comment: rare   Drug use: Yes   Sexual activity: Not Currently  Other Topics Concern   Not on file  Social History Narrative   Not on file   Social Drivers of Health   Financial Resource Strain: Not on file  Food Insecurity: Not on file  Transportation Needs: Not on file  Physical Activity: Not on file  Stress: Not on  file  Social Connections: Not on file   Past Surgical History:  Procedure Laterality Date   AMPUTATION Left 11/25/2012   Procedure: AMPUTATION BELOW KNEE;  Surgeon: Jerona LULLA Sage, MD;  Location: [facility] OR;  Service: Orthopedics;  Laterality: Left;  Left Below Knee Amputation   APLIGRAFT PLACEMENT Left 07/31/2012   Procedure: Apply Skin Graft;  Surgeon: Jerona LULLA Sage, MD;  Location: [facility] OR;  Service: Orthopedics;  Laterality: Left;   APPLICATION OF WOUND VAC Left 07/31/2012   Procedure: APPLICATION OF WOUND VAC;  Surgeon: Jerona LULLA Sage, MD;  Location: [facility] OR;  Service: Orthopedics;  Laterality: Left;   arm surgery     back stimulator     CARDIAC CATHETERIZATION     Hx: of  around 1985 at [facility]   CYST EXCISION     Hx: of   I & D EXTREMITY  03/13/2011   Procedure: IRRIGATION AND DEBRIDEMENT EXTREMITY;  Surgeon: Jerona LULLA Sage, MD;  Location: [facility] OR;  Service: Orthopedics;  Laterality: Left;   I & D EXTREMITY Left 11/06/2012   Procedure: IRRIGATION AND DEBRIDEMENT EXTREMITY-Left lower ;  Surgeon: Jerona LULLA Sage, MD;  Location: [facility] OR;  Service: Orthopedics;  Laterality: Left;  Left Leg Irrigation  and Debridement, Place Skin Graft, Wound VAC, Theraskin   skin grafts     SKIN SPLIT GRAFT  03/13/2012   Procedure: SKIN GRAFT SPLIT THICKNESS;  Surgeon: Jerona LULLA Sage, MD;  Location: [facility] OR;  Service: Orthopedics;  Laterality: Left;  Excisional Debridement Left Leg, Split Thickness Skin Graft, Wound VAC   stomach stapling     tummy tuck     VEIN LIGATION AND STRIPPING     Past Surgical History:  Procedure Laterality Date   AMPUTATION Left 11/25/2012   Procedure: AMPUTATION BELOW KNEE;  Surgeon: Jerona LULLA Sage, MD;  Location: [facility];  Service: Orthopedics;  Laterality: Left;  Left Below Knee Amputation   APLIGRAFT PLACEMENT Left 07/31/2012   Procedure: Apply Skin Graft;  Surgeon: Jerona LULLA Sage, MD;  Location: [facility] OR;  Service: Orthopedics;  Laterality: Left;   APPLICATION OF WOUND VAC Left 07/31/2012   Procedure:  APPLICATION OF WOUND VAC;  Surgeon: Jerona LULLA Sage, MD;  Location: [facility];  Service: Orthopedics;  Laterality: Left;   arm surgery     back stimulator     CARDIAC CATHETERIZATION     Hx: of  around 1985 at [facility]   CYST EXCISION     Hx: of   I & D EXTREMITY  03/13/2011   Procedure: IRRIGATION AND DEBRIDEMENT EXTREMITY;  Surgeon: Jerona LULLA Sage, MD;  Location: [facility];  Service: Orthopedics;  Laterality: Left;   I & D EXTREMITY Left 11/06/2012   Procedure: IRRIGATION AND DEBRIDEMENT EXTREMITY-Left lower ;  Surgeon: Jerona LULLA Sage, MD;  Location: [facility];  Service: Orthopedics;  Laterality: Left;  Left Leg Irrigation and Debridement, Place Skin Graft, Wound VAC, Theraskin   skin grafts     SKIN SPLIT GRAFT  03/13/2012   Procedure: SKIN GRAFT SPLIT THICKNESS;  Surgeon: Jerona LULLA Sage, MD;  Location: [facility] OR;  Service: Orthopedics;  Laterality: Left;  Excisional Debridement Left Leg, Split Thickness Skin Graft, Wound VAC   stomach stapling     tummy tuck     VEIN LIGATION AND STRIPPING     Past Medical History:  Diagnosis Date   Anemia    Asthma    Cancer (HCC)    hx basal cell skin cancer   CHF (congestive heart failure) (HCC)    does not see a cardiologist   COPD (chronic obstructive pulmonary disease) (HCC)    3L continuous   Diabetes mellitus    not on medication   DVT (deep venous thrombosis) (HCC)    LLE DVT '80's   GERD (gastroesophageal reflux disease)    Headache(784.0)    sinus headaches   Leg pain    Peripheral neuropathy    Peripheral vascular disease    Psoriasis    Shortness of breath    Sleep apnea    uses bipap, sleep study done in [place] 2 years ago, dr suzen byrd   BP 120/89 (BP Location: Left Arm, Patient Position: Sitting, Cuff Size: Normal)   Pulse 86   Ht 5' 10 (1.778 m)   Wt 263 lb 4.8 oz (119.4 kg)   SpO2 96%   BMI 37.78 kg/m   Opioid Risk Score:   Fall Risk Score:  `1  Depression screen PHQ 2/9     12/30/2023    1:06 PM 10/29/2023   12:58 PM  06/26/2023    1:13 PM 05/01/2023    1:01 PM 01/02/2023   12:52 PM 09/05/2022    1:02 PM 09/05/2022   12:59 PM  Depression screen PHQ 2/9  Decreased Interest 0 0 0 0 0 0 0  Down, Depressed, Hopeless 0 0 0 0 0 0 0  PHQ - 2 Score 0 0 0 0 0 0 0     Review of Systems  Musculoskeletal:  Positive for arthralgias and back pain.       Low back pain, left ankle pain  All other systems reviewed and are negative.      Objective:   Physical Exam Vitals and nursing note reviewed.  Constitutional:      Appearance: Normal appearance.  Cardiovascular:     Rate and Rhythm: Normal rate and regular rhythm.     Pulses: Normal pulses.     Heart sounds: Normal heart sounds.  Pulmonary:     Effort: Pulmonary effort is normal.     Breath sounds: Normal breath sounds.  Musculoskeletal:     Comments: Normal Muscle Bulk and Muscle Testing Reveals:  Upper Extremities: Full ROM and Muscle Strength 5/5  Lower Extremities: Right Full ROM and Muscle Strength 5/5 Left Lower Extremity: Left BKA Arrived in wheelchair      Skin:    General: Skin is warm and dry.  Neurological:     Mental Status: He is alert and oriented to person, place, and time.  Psychiatric:        Mood and Affect: Mood normal.        Behavior: Behavior normal.          Assessment & Plan:  1.Left BKA/ [place] Pain : Continue current medication regimen: Continue current regimen until 02/25/2024, Refilled: oxycodone  15 mg one tablet 4 times a day as needed # 120 and  Butrans  10 mcg weekly #4   Second script sent for the following month. We will continue the opioid monitoring program, this consists of regular clinic visits, examinations, urine drug screen, pill counts as well as use of [place]  Controlled Substance Reporting system. A 12 month History has been reviewed on the   Controlled Substance Reporting System on 02/25/2024.Continue: Topamax  50 mg one tablet at HS and Amitriptyline .. 2. Chronic Bilateral Low Back  Pain without Sciatica: Continue HEP as Tolerated. Continue to Monitor. 02/25/2024. 3.Chronic  pain syndrome: Continue Current medication Regime. 02/25/2024. 4. Hx of spinal stimulator. Not Functioning: Jeremy [name] states he called the company, at this time he doesn't want to have a new stimulator placed at this time. Continue to Monitor. 02/25/2024 5. COPD: On Continuous Oxygen Therapy @ 3 liters Nasal Cannula. Pulmonology Following. 02/25/2024 6. Depression: No complaints today. Continue: Cymbalta . Continue to monitor. 11/05//2025 7.. Sacral Decubitus Stage 3:  Continue adequate nutrition and weight shifting  Wound Care Nurse and ID Following. 02/25/2024.    F/U in 2 months

## 2024-04-12 ENCOUNTER — Telehealth: Payer: Self-pay | Admitting: Registered Nurse

## 2024-04-12 DIAGNOSIS — G8929 Other chronic pain: Secondary | ICD-10-CM

## 2024-04-12 DIAGNOSIS — G894 Chronic pain syndrome: Secondary | ICD-10-CM

## 2024-04-12 MED ORDER — BUPRENORPHINE 10 MCG/HR TD PTWK: 1.0000 | MEDICATED_PATCH | TRANSDERMAL | 1 refills | Status: DC

## 2024-04-12 MED ORDER — OXYCODONE HCL 15 MG PO TABS: 15.0000 mg | ORAL_TABLET | Freq: Four times a day (QID) | ORAL | 0 refills | Status: DC | PRN

## 2024-04-12 NOTE — Telephone Encounter (Signed)
 PDMP was Reviewed Butrans  and Oxycodone  e-scribed to pharmacy.  Jeremy [name] is aware of the above and verbalizes understanding.

## 2024-04-27 ENCOUNTER — Encounter: Admitting: Registered Nurse

## 2024-05-12 ENCOUNTER — Encounter: Payer: Self-pay | Admitting: Registered Nurse

## 2024-05-12 ENCOUNTER — Encounter: Attending: Registered Nurse | Admitting: Registered Nurse

## 2024-05-12 VITALS — BP 151/66 | HR 69 | Ht 70.0 in | Wt 205.4 lb

## 2024-05-12 DIAGNOSIS — M545 Low back pain, unspecified: Secondary | ICD-10-CM | POA: Insufficient documentation

## 2024-05-12 DIAGNOSIS — G894 Chronic pain syndrome: Secondary | ICD-10-CM | POA: Diagnosis not present

## 2024-05-12 DIAGNOSIS — G546 Phantom limb syndrome with pain: Secondary | ICD-10-CM | POA: Insufficient documentation

## 2024-05-12 DIAGNOSIS — Z79891 Long term (current) use of opiate analgesic: Secondary | ICD-10-CM | POA: Diagnosis not present

## 2024-05-12 DIAGNOSIS — Z5181 Encounter for therapeutic drug level monitoring: Secondary | ICD-10-CM | POA: Diagnosis not present

## 2024-05-12 DIAGNOSIS — G8929 Other chronic pain: Secondary | ICD-10-CM | POA: Insufficient documentation

## 2024-05-12 MED ORDER — OXYCODONE HCL 15 MG PO TABS: 15.0000 mg | ORAL_TABLET | Freq: Four times a day (QID) | ORAL | 0 refills | Status: AC | PRN

## 2024-05-12 MED ORDER — BUPRENORPHINE 10 MCG/HR TD PTWK: 1.0000 | MEDICATED_PATCH | TRANSDERMAL | 1 refills | Status: AC

## 2024-05-12 MED ORDER — OXYCODONE HCL 15 MG PO TABS: 15.0000 mg | ORAL_TABLET | Freq: Four times a day (QID) | ORAL | 0 refills | Status: DC | PRN

## 2024-05-12 NOTE — Progress Notes (Signed)
 "  Subjective:    Patient ID: Jeremy [name], male    DOB: [birth year], 74 y.o.   MRN: [id number]  HPI: Jeremy [name] is a 74 y.o. male who returns for follow up appointment for chronic pain and medication refill. 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 He rates his pain 6. His current exercise regime is  performing stretching exercises.  Jeremy [name] equivalent is 90.00 MME.   Oral Swab was Performed today.     Pain Inventory Average Pain 8 Pain Right Now 6 My pain is sharp, stabbing, and aching  In the last 24 hours, has pain interfered with the following? General activity 7 Relation with others 7 Enjoyment of life 7 What TIME of day is your pain at its worst? morning  Sleep (in general) Poor  Pain is worse with: inactivity Pain improves with: medication Relief from Meds: 4  Family History  Problem Relation Age of Onset   Heart disease Brother    Social History   Socioeconomic History   Marital status: Married    Spouse name: Not on file   Number of children: Not on file   Years of education: Not on file   Highest education level: Not on file  Occupational History   Not on file  Tobacco Use   Smoking status: Former    Current packs/day: 0.00    Average packs/day: 0.5 packs/day for 6.0 years (3.0 ttl pk-yrs)    Types: Cigarettes    Start date: 12/02/1984    Quit date: 12/03/1990    Years since quitting: 33.4   Smokeless tobacco: Never  Vaping Use   Vaping status: Never Used  Substance and Sexual Activity   Alcohol  use: Yes    Alcohol /week: 0.0 standard drinks of alcohol     Comment: rare   Drug use: Yes   Sexual activity: Not Currently  Other Topics Concern   Not on file  Social History Narrative   Not on file   Social Drivers of Health   Tobacco Use: Medium Risk (02/25/2024)   Patient History    Smoking Tobacco Use: Former    Smokeless Tobacco Use: Never    Passive Exposure: Not on [employer], [employer] Strain: Not on file  Food Insecurity: Not on file  Transportation Needs: Not on file  Physical Activity: Not on file  Stress: Not on file  Social Connections: Not on file  Depression (PHQ2-9): Low Risk (12/30/2023)   Depression (PHQ2-9)    PHQ-2 Score: 0  Alcohol  Screen: Not on file  Housing: Not on file  Utilities: Not on file  Health Literacy: Not on file   Past Surgical History:  Procedure Laterality Date   AMPUTATION Left 11/25/2012   Procedure: AMPUTATION BELOW KNEE;  Surgeon: Jerona LULLA Sage, MD;  Location: [facility] OR;  Service: Orthopedics;  Laterality: Left;  Left Below Knee Amputation   APLIGRAFT PLACEMENT Left 07/31/2012   Procedure: Apply Skin Graft;  Surgeon: Jerona LULLA Sage, MD;  Location: [facility] OR;  Service: Orthopedics;  Laterality: Left;   APPLICATION OF WOUND VAC Left 07/31/2012   Procedure: APPLICATION OF WOUND VAC;  Surgeon: Jerona LULLA Sage, MD;  Location: [facility] OR;  Service: Orthopedics;  Laterality: Left;   arm surgery     back stimulator     CARDIAC CATHETERIZATION     Hx: of  around 1985 at [facility]   CYST EXCISION  Hx: of   I & D EXTREMITY  03/13/2011   Procedure: IRRIGATION AND DEBRIDEMENT EXTREMITY;  Surgeon: Jerona LULLA Sage, MD;  Location: [facility] OR;  Service: Orthopedics;  Laterality: Left;   I & D EXTREMITY Left 11/06/2012   Procedure: IRRIGATION AND DEBRIDEMENT EXTREMITY-Left lower ;  Surgeon: Jerona LULLA Sage, MD;  Location: [facility] OR;  Service: Orthopedics;  Laterality: Left;  Left Leg Irrigation and Debridement, Place Skin Graft, Wound VAC, [place]   skin grafts     SKIN SPLIT GRAFT  03/13/2012   Procedure: SKIN GRAFT SPLIT THICKNESS;  Surgeon: Jerona LULLA Sage, MD;  Location: [facility] OR;  Service: Orthopedics;  Laterality: Left;  Excisional Debridement Left Leg, Split Thickness Skin Graft, Wound VAC   stomach stapling     tummy tuck     VEIN LIGATION AND STRIPPING     Past Surgical History:  Procedure Laterality Date   AMPUTATION Left 11/25/2012   Procedure:  AMPUTATION BELOW KNEE;  Surgeon: Jerona LULLA Sage, MD;  Location: [facility] OR;  Service: Orthopedics;  Laterality: Left;  Left Below Knee Amputation   APLIGRAFT PLACEMENT Left 07/31/2012   Procedure: Apply Skin Graft;  Surgeon: Jerona LULLA Sage, MD;  Location: [facility] OR;  Service: Orthopedics;  Laterality: Left;   APPLICATION OF WOUND VAC Left 07/31/2012   Procedure: APPLICATION OF WOUND VAC;  Surgeon: Jerona LULLA Sage, MD;  Location: [facility] OR;  Service: Orthopedics;  Laterality: Left;   arm surgery     back stimulator     CARDIAC CATHETERIZATION     Hx: of  around 1985 at [facility]   CYST EXCISION     Hx: of   I & D EXTREMITY  03/13/2011   Procedure: IRRIGATION AND DEBRIDEMENT EXTREMITY;  Surgeon: Jerona LULLA Sage, MD;  Location: [facility] OR;  Service: Orthopedics;  Laterality: Left;   I & D EXTREMITY Left 11/06/2012   Procedure: IRRIGATION AND DEBRIDEMENT EXTREMITY-Left lower ;  Surgeon: Jerona LULLA Sage, MD;  Location: [facility] OR;  Service: Orthopedics;  Laterality: Left;  Left Leg Irrigation and Debridement, Place Skin Graft, Wound VAC, [place]in   skin grafts     SKIN SPLIT GRAFT  03/13/2012   Procedure: SKIN GRAFT SPLIT THICKNESS;  Surgeon: Jerona LULLA Sage, MD;  Location: [facility] OR;  Service: Orthopedics;  Laterality: Left;  Excisional Debridement Left Leg, Split Thickness Skin Graft, Wound VAC   stomach stapling     tummy tuck     VEIN LIGATION AND STRIPPING     Past Medical History:  Diagnosis Date   Anemia    Asthma    Cancer (HCC)    hx basal cell skin cancer   CHF (congestive heart failure) (HCC)    does not see a cardiologist   COPD (chronic obstructive pulmonary disease) (HCC)    3L continuous   Diabetes mellitus    not on medication   DVT (deep venous thrombosis) (HCC)    LLE DVT '80's   GERD (gastroesophageal reflux disease)    Headache(784.0)    sinus headaches   Leg pain    Peripheral neuropathy    Peripheral vascular disease    Psoriasis    Shortness of breath    Sleep apnea    uses bipap, sleep study  done in [place] 2 years ago, dr suzen byrd   BP (!) 154/58   Pulse 69   Ht 5' 10 (1.778 m)   Wt 205 lb 6.4 oz (93.2 kg)   SpO2 98%   BMI 29.47  kg/m   Opioid Risk Score:   Fall Risk Score:  `1  Depression screen PHQ 2/9     12/30/2023    1:06 PM 10/29/2023   12:58 PM 06/26/2023    1:13 PM 05/01/2023    1:01 PM 01/02/2023   12:52 PM 09/05/2022    1:02 PM 09/05/2022   12:59 PM  Depression screen PHQ 2/9  Decreased Interest 0 0 0 0 0 0 0  Down, Depressed, Hopeless 0 0 0 0 0 0 0  PHQ - 2 Score 0 0 0 0 0 0 0     Review of Systems  Musculoskeletal:  Positive for back pain.       Stump pain on left  All other systems reviewed and are negative.      Objective:   Physical Exam Vitals and nursing note reviewed.  Constitutional:      Appearance: Normal appearance.  Cardiovascular:     Rate and Rhythm: Normal rate and regular rhythm.     Pulses: Normal pulses.     Heart sounds: Normal heart sounds.  Pulmonary:     Effort: Pulmonary effort is normal.     Breath sounds: Normal breath sounds.     Comments: Continuous 3 liters nasal cannula  Musculoskeletal:     Comments: Normal Muscle Bulk and Muscle Testing Reveals:  Upper Extremities: Full ROM and Muscle Strength 5/5 Lumbar Paraspinal Tenderness: L-4-L-5 Lower Extremities: Right: Full ROM and Muscle Strength 5/5 Left: BKA Arrived in wheelchair      Skin:    General: Skin is warm and dry.  Neurological:     General: No focal deficit present.     Mental Status: He is alert and oriented to person, place, and time.          Assessment & Plan:  1.Left BKA/ [place] Pain : Continue current medication regimen: Continue current regimen until 05/12/2024, Refilled: oxycodone  15 mg one tablet 4 times a day as needed # 120 and  Butrans  10 mcg weekly #4   Second script sent for the following month. We will continue the opioid monitoring program, this consists of regular clinic visits, examinations, urine drug screen, pill counts  as well as use of [place]  Controlled Substance Reporting system. A 12 month History has been reviewed on the [place]  Controlled Substance Reporting System on 02/25/2024.Continue: Topamax  50 mg one tablet at HS and Amitriptyline .. 2. Chronic Bilateral Low Back Pain without Sciatica: Continue HEP as Tolerated. Continue to Monitor. 05/12/2024. 3.Chronic  pain syndrome: Continue Current medication Regime. 05/12/2024. 4. Hx of spinal stimulator. Not Functioning: Mr. Ran states he called the company, at this time he doesn't want to have a new stimulator placed at this time. Continue to Monitor. 05/12/2024 5. COPD: On Continuous Oxygen Therapy @ 3 liters Nasal Cannula. Pulmonology Following. 05/12/2024 6. Depression: No complaints today. Continue: Cymbalta . Continue to monitor. 01/21//2026 7.. Sacral Decubitus Stage 3:  Continue adequate nutrition and weight shifting  Wound Care Nurse and ID Following. 05/12/2024.    F/U in 2 months    "

## 2024-05-19 LAB — DRUG TOX MONITOR 1 W/CONF, ORAL FLD
Amphetamines: NEGATIVE ng/mL
Barbiturates: NEGATIVE ng/mL
Benzodiazepines: NEGATIVE ng/mL
Buprenorphine: NEGATIVE ng/mL
Buprenorphine: NEGATIVE ng/mL
Cocaine: NEGATIVE ng/mL
Codeine: NEGATIVE ng/mL
Dihydrocodeine: NEGATIVE ng/mL
Fentanyl: NEGATIVE ng/mL
Hydrocodone: NEGATIVE ng/mL
Hydromorphone: NEGATIVE ng/mL
MARIJUANA: NEGATIVE ng/mL
MDMA: NEGATIVE ng/mL
Meprobamate: NEGATIVE ng/mL
Methadone: NEGATIVE ng/mL
Morphine: 2.5 ng/mL — ABNORMAL HIGH
Naloxone: NEGATIVE ng/mL
Nicotine Metabolite: NEGATIVE ng/mL
Norbuprenorphine: NEGATIVE ng/mL
Norhydrocodone: NEGATIVE ng/mL
Noroxycodone: 15.9 ng/mL — ABNORMAL HIGH
Opiates: POSITIVE ng/mL — AB
Oxycodone: >250 ng/mL — ABNORMAL HIGH
Oxymorphone: NEGATIVE ng/mL
Phencyclidine: NEGATIVE ng/mL
Tapentadol: NEGATIVE ng/mL
Tramadol: NEGATIVE ng/mL
Zolpidem: NEGATIVE ng/mL

## 2024-05-19 LAB — DRUG TOX ALC METAB W/CON, ORAL FLD: Alcohol Metabolite: NEGATIVE ng/mL

## 2024-05-20 ENCOUNTER — Telehealth: Payer: Self-pay | Admitting: Registered Nurse

## 2024-05-20 NOTE — Telephone Encounter (Signed)
 Oral Swab was Reviewed. PDMP was Reviewed.  + Morphine. He is prescribed Oxycodone  and Butrans   Call placed to Jeremy [name], left message to return the call.

## 2024-05-21 ENCOUNTER — Telehealth: Payer: Self-pay | Admitting: Registered Nurse

## 2024-05-21 NOTE — Telephone Encounter (Signed)
 Jeremy [name] returned your call. Please call back on phone number [phone].

## 2024-05-21 NOTE — Telephone Encounter (Signed)
 Return Mr. Tenbrink call, no answer, left message to return the call.

## 2024-05-21 NOTE — Telephone Encounter (Signed)
 Return Jeremy [name] call,  He is adamant he is compliant with his medication alone. He hasn't taken any medication from anyone. Will ask Sybil RN to ask [facility] to run swab again, he verbalizes understanding.

## 2024-05-21 NOTE — Telephone Encounter (Signed)
 Patient doesn't know where morpine came from unless it was from patch.  Please return his call.

## 2024-07-14 ENCOUNTER — Encounter: Admitting: Registered Nurse
